# Patient Record
Sex: Female | Born: 1945
Health system: Southern US, Community
[De-identification: ages and names within clinical notes are randomized; demographics above are authoritative.]

## PROBLEM LIST (undated history)

## (undated) DIAGNOSIS — D126 Benign neoplasm of colon, unspecified: Secondary | ICD-10-CM

## (undated) DIAGNOSIS — D110 Benign neoplasm of parotid gland: Secondary | ICD-10-CM

## (undated) DIAGNOSIS — N289 Disorder of kidney and ureter, unspecified: Secondary | ICD-10-CM

## (undated) DIAGNOSIS — R1310 Dysphagia, unspecified: Secondary | ICD-10-CM

## (undated) DIAGNOSIS — F419 Anxiety disorder, unspecified: Secondary | ICD-10-CM

## (undated) DIAGNOSIS — M810 Age-related osteoporosis without current pathological fracture: Secondary | ICD-10-CM

## (undated) DIAGNOSIS — K219 Gastro-esophageal reflux disease without esophagitis: Secondary | ICD-10-CM

## (undated) DIAGNOSIS — R519 Headache, unspecified: Secondary | ICD-10-CM

## (undated) DIAGNOSIS — E039 Hypothyroidism, unspecified: Secondary | ICD-10-CM

## (undated) DIAGNOSIS — I10 Essential (primary) hypertension: Secondary | ICD-10-CM

## (undated) DIAGNOSIS — E785 Hyperlipidemia, unspecified: Secondary | ICD-10-CM

## (undated) DIAGNOSIS — N393 Stress incontinence (female) (male): Secondary | ICD-10-CM

## (undated) DIAGNOSIS — K635 Polyp of colon: Secondary | ICD-10-CM

## (undated) DIAGNOSIS — N951 Menopausal and female climacteric states: Secondary | ICD-10-CM

## (undated) DIAGNOSIS — F5104 Psychophysiologic insomnia: Secondary | ICD-10-CM

## (undated) DIAGNOSIS — E559 Vitamin D deficiency, unspecified: Secondary | ICD-10-CM

## (undated) DIAGNOSIS — M1990 Unspecified osteoarthritis, unspecified site: Secondary | ICD-10-CM

## (undated) DIAGNOSIS — T7840XA Allergy, unspecified, initial encounter: Secondary | ICD-10-CM

## (undated) DIAGNOSIS — H409 Unspecified glaucoma: Secondary | ICD-10-CM

## (undated) HISTORY — DX: Unspecified glaucoma: H40.9

## (undated) HISTORY — PX: ABDOMINAL HYSTERECTOMY: SHX81

## (undated) HISTORY — DX: Anxiety disorder, unspecified: F41.9

## (undated) HISTORY — DX: Vitamin D deficiency, unspecified: E55.9

## (undated) HISTORY — DX: Menopausal and female climacteric states: N95.1

## (undated) HISTORY — DX: Unspecified osteoarthritis, unspecified site: M19.90

## (undated) HISTORY — DX: Benign neoplasm of parotid gland: D11.0

## (undated) HISTORY — PX: OVARIAN CYST REMOVAL: SHX89

## (undated) HISTORY — PX: APPENDECTOMY: SHX54

## (undated) HISTORY — DX: Hyperlipidemia, unspecified: E78.5

## (undated) HISTORY — DX: Allergy, unspecified, initial encounter: T78.40XA

## (undated) HISTORY — DX: Stress incontinence (female) (male): N39.3

## (undated) HISTORY — PX: TONSILLECTOMY: SUR1361

## (undated) HISTORY — PX: EYE SURGERY: SHX253

## (undated) HISTORY — DX: Age-related osteoporosis without current pathological fracture: M81.0

## (undated) HISTORY — DX: Psychophysiologic insomnia: F51.04

---

## 2006-01-11 ENCOUNTER — Ambulatory Visit: Payer: Self-pay | Admitting: Endocrinology

## 2006-04-21 ENCOUNTER — Ambulatory Visit: Payer: Self-pay | Admitting: Gastroenterology

## 2006-09-29 ENCOUNTER — Ambulatory Visit: Payer: Self-pay | Admitting: Endocrinology

## 2008-12-01 ENCOUNTER — Ambulatory Visit: Payer: Self-pay | Admitting: Nephrology

## 2010-04-18 LAB — HM COLONOSCOPY

## 2010-05-18 ENCOUNTER — Ambulatory Visit: Payer: Self-pay | Admitting: Gastroenterology

## 2012-09-20 ENCOUNTER — Ambulatory Visit: Payer: Self-pay | Admitting: Family Medicine

## 2014-02-18 LAB — HM MAMMOGRAPHY: HM Mammogram: NORMAL

## 2014-02-18 LAB — HM DEXA SCAN

## 2015-02-19 LAB — LIPID PANEL
Cholesterol: 135 mg/dL (ref 0–200)
HDL: 58 mg/dL (ref 35–70)
LDL Cholesterol: 60 mg/dL
Triglycerides: 87 mg/dL (ref 40–160)

## 2015-02-19 LAB — HEMOGLOBIN A1C: Hgb A1c MFr Bld: 6 % (ref 4.0–6.0)

## 2015-03-03 ENCOUNTER — Ambulatory Visit: Payer: Self-pay

## 2015-05-06 LAB — HM MAMMOGRAPHY: HM Mammogram: NORMAL

## 2015-05-20 LAB — HM COLONOSCOPY: HM Colonoscopy: NORMAL

## 2015-05-26 ENCOUNTER — Encounter: Payer: Self-pay | Admitting: *Deleted

## 2015-05-28 ENCOUNTER — Encounter: Payer: Self-pay | Admitting: *Deleted

## 2015-05-29 ENCOUNTER — Ambulatory Visit: Payer: PPO | Admitting: Anesthesiology

## 2015-05-29 ENCOUNTER — Encounter: Payer: Self-pay | Admitting: Anesthesiology

## 2015-05-29 ENCOUNTER — Ambulatory Visit
Admission: RE | Admit: 2015-05-29 | Discharge: 2015-05-29 | Disposition: A | Discharge status: Home / Self Care | Payer: PPO | Source: Ambulatory Visit | Attending: Gastroenterology | Admitting: Gastroenterology

## 2015-05-29 ENCOUNTER — Encounter
Admission: RE | Disposition: A | Discharge status: Home / Self Care | Payer: Self-pay | Source: Ambulatory Visit | Attending: Gastroenterology

## 2015-05-29 DIAGNOSIS — K222 Esophageal obstruction: Secondary | ICD-10-CM | POA: Diagnosis not present

## 2015-05-29 DIAGNOSIS — Z7982 Long term (current) use of aspirin: Secondary | ICD-10-CM | POA: Diagnosis not present

## 2015-05-29 DIAGNOSIS — Z7951 Long term (current) use of inhaled steroids: Secondary | ICD-10-CM | POA: Insufficient documentation

## 2015-05-29 DIAGNOSIS — J449 Chronic obstructive pulmonary disease, unspecified: Secondary | ICD-10-CM | POA: Insufficient documentation

## 2015-05-29 DIAGNOSIS — F419 Anxiety disorder, unspecified: Secondary | ICD-10-CM | POA: Insufficient documentation

## 2015-05-29 DIAGNOSIS — R1310 Dysphagia, unspecified: Secondary | ICD-10-CM | POA: Insufficient documentation

## 2015-05-29 DIAGNOSIS — Z09 Encounter for follow-up examination after completed treatment for conditions other than malignant neoplasm: Secondary | ICD-10-CM | POA: Diagnosis not present

## 2015-05-29 DIAGNOSIS — E039 Hypothyroidism, unspecified: Secondary | ICD-10-CM | POA: Insufficient documentation

## 2015-05-29 DIAGNOSIS — I10 Essential (primary) hypertension: Secondary | ICD-10-CM | POA: Insufficient documentation

## 2015-05-29 DIAGNOSIS — Z79899 Other long term (current) drug therapy: Secondary | ICD-10-CM | POA: Insufficient documentation

## 2015-05-29 DIAGNOSIS — Z86010 Personal history of colonic polyps: Secondary | ICD-10-CM | POA: Diagnosis present

## 2015-05-29 HISTORY — DX: Hyperlipidemia, unspecified: E78.5

## 2015-05-29 HISTORY — DX: Polyp of colon: K63.5

## 2015-05-29 HISTORY — DX: Benign neoplasm of colon, unspecified: D12.6

## 2015-05-29 HISTORY — DX: Hypothyroidism, unspecified: E03.9

## 2015-05-29 HISTORY — DX: Dysphagia, unspecified: R13.10

## 2015-05-29 HISTORY — PX: ESOPHAGOGASTRODUODENOSCOPY: SHX5428

## 2015-05-29 HISTORY — DX: Essential (primary) hypertension: I10

## 2015-05-29 HISTORY — DX: Gastro-esophageal reflux disease without esophagitis: K21.9

## 2015-05-29 HISTORY — PX: COLONOSCOPY WITH PROPOFOL: SHX5780

## 2015-05-29 SURGERY — COLONOSCOPY WITH PROPOFOL
Anesthesia: General

## 2015-05-29 MED ORDER — PROPOFOL 10 MG/ML IV BOLUS
INTRAVENOUS | Status: DC | PRN
  Administered 2015-05-29: 50 mg via INTRAVENOUS

## 2015-05-29 MED ORDER — FENTANYL CITRATE (PF) 100 MCG/2ML IJ SOLN
INTRAMUSCULAR | Status: DC | PRN
  Administered 2015-05-29: 50 ug via INTRAVENOUS

## 2015-05-29 MED ORDER — SODIUM CHLORIDE 0.9 % IV SOLN
INTRAVENOUS | Status: DC
  Administered 2015-05-29: 1000 mL via INTRAVENOUS
  Administered 2015-05-29: 09:00:00 via INTRAVENOUS

## 2015-05-29 MED ORDER — PHENYLEPHRINE HCL 10 MG/ML IJ SOLN
INTRAMUSCULAR | Status: DC | PRN
  Administered 2015-05-29: 100 ug via INTRAVENOUS

## 2015-05-29 MED ORDER — LIDOCAINE HCL (CARDIAC) 20 MG/ML IV SOLN
INTRAVENOUS | Status: DC | PRN
  Administered 2015-05-29: 50 mg via INTRAVENOUS

## 2015-05-29 MED ORDER — PROPOFOL INFUSION 10 MG/ML OPTIME
INTRAVENOUS | Status: DC | PRN
  Administered 2015-05-29: 75 ug/kg/min via INTRAVENOUS

## 2015-05-29 MED ORDER — GLYCOPYRROLATE 0.2 MG/ML IJ SOLN
INTRAMUSCULAR | Status: DC | PRN
  Administered 2015-05-29: 0.2 mg via INTRAVENOUS

## 2015-05-29 NOTE — Anesthesia Preprocedure Evaluation (Signed)
Anesthesia Evaluation  Patient identified by MRN, date of birth, ID band Patient awake    Reviewed: Allergy & Precautions, NPO status , Patient's Chart, lab work & pertinent test results  Airway Mallampati: III  TM Distance: <3 FB Neck ROM: Full   Comment: Small mouth Dental  (+) Caps, Chipped   Pulmonary COPD COPD inhaler,  breath sounds clear to auscultation  Pulmonary exam normal       Cardiovascular hypertension, Pt. on medications Normal cardiovascular examRhythm:Regular Rate:Normal     Neuro/Psych Anxiety negative neurological ROS     GI/Hepatic Neg liver ROS, Hx of colon polyps   Endo/Other  Hypothyroidism   Renal/GU negative Renal ROS  negative genitourinary   Musculoskeletal negative musculoskeletal ROS (+)   Abdominal Normal abdominal exam  (+)   Peds negative pediatric ROS (+)  Hematology negative hematology ROS (+)   Anesthesia Other Findings   Reproductive/Obstetrics                             Anesthesia Physical Anesthesia Plan  ASA: III  Anesthesia Plan: General   Post-op Pain Management:    Induction: Intravenous  Airway Management Planned: Nasal Cannula  Additional Equipment:   Intra-op Plan:   Post-operative Plan:   Informed Consent: I have reviewed the patients History and Physical, chart, labs and discussed the procedure including the risks, benefits and alternatives for the proposed anesthesia with the patient or authorized representative who has indicated his/her understanding and acceptance.   Dental advisory given  Plan Discussed with: CRNA and Surgeon  Anesthesia Plan Comments:         Anesthesia Quick Evaluation

## 2015-05-29 NOTE — Anesthesia Postprocedure Evaluation (Signed)
  Anesthesia Post-op Note  Patient: Dawn Fuentes  Procedure(s) Performed: Procedure(s): COLONOSCOPY WITH PROPOFOL (N/A) ESOPHAGOGASTRODUODENOSCOPY (EGD) (N/A)  Anesthesia type:General  Patient location: PACU  Post pain: Pain level controlled  Post assessment: Post-op Vital signs reviewed, Patient's Cardiovascular Status Stable, Respiratory Function Stable, Patent Airway and No signs of Nausea or vomiting  Post vital signs: Reviewed and stable  Last Vitals:  Filed Vitals:   05/29/15 0950  BP: 120/57  Pulse: 79  Temp:   Resp: 23    Level of consciousness: awake, alert  and patient cooperative  Complications: No apparent anesthesia complications

## 2015-05-29 NOTE — H&P (Signed)
Primary Care Physician:  Loistine Chance, MD Primary Gastroenterologist:  Dr. Candace Cruise  Pre-Procedure History & Physical: HPI:  Dawn Fuentes is a 69 y.o. female is here for an EGD with dilation and colonoscopy.   Past Medical History  Diagnosis Date  . Colon adenoma   . Polyp of colon   . Dysphagia     No past surgical history on file.  Prior to Admission medications   Medication Sig Start Date End Date Taking? Authorizing Provider  albuterol (PROVENTIL HFA;VENTOLIN HFA) 108 (90 BASE) MCG/ACT inhaler Inhale 2 puffs into the lungs every 4 (four) hours as needed for wheezing or shortness of breath.   Yes Historical Provider, MD  ALPRAZolam Duanne Moron) 0.5 MG tablet Take 0.5-1 tablets by mouth 4 (four) times daily as needed.  04/03/15  Yes Historical Provider, MD  aspirin 81 MG tablet Take 81 mg by mouth daily.   Yes Historical Provider, MD  bacitracin ophthalmic ointment Place 1 application into both eyes 4 (four) times daily as needed for wound care.  03/03/15  Yes Historical Provider, MD  cloNIDine (CATAPRES) 0.1 MG tablet Take 0.1 mg by mouth every evening.   Yes Historical Provider, MD  desonide (DESOWEN) 0.05 % cream Apply 1 application topically 3 (three) times daily as needed (healing).  03/19/15  Yes Historical Provider, MD  fluticasone (FLONASE) 50 MCG/ACT nasal spray Place 2 sprays into both nostrils daily as needed for allergies.  04/03/15  Yes Historical Provider, MD  levothyroxine (SYNTHROID, LEVOTHROID) 88 MCG tablet Take 44-88 mcg by mouth daily before breakfast. Monday and Wednesday 1 tablet. Tues, thurs, fri, sat, and sun take 1/2 tablet   Yes Historical Provider, MD  loratadine (CLARITIN) 10 MG tablet Take 10 mg by mouth daily as needed for allergies.   Yes Historical Provider, MD  losartan (COZAAR) 100 MG tablet Take 100 mg by mouth daily.   Yes Historical Provider, MD  montelukast (SINGULAIR) 10 MG tablet Take 10 mg by mouth daily as needed (allergies).   Yes Historical  Provider, MD  pantoprazole (PROTONIX) 40 MG tablet Take 40 mg by mouth daily.   Yes Historical Provider, MD  simvastatin (ZOCOR) 40 MG tablet Take 40 mg by mouth daily.   Yes Historical Provider, MD  zolpidem (AMBIEN) 10 MG tablet Take 5-10 mg by mouth at bedtime as needed for sleep.   Yes Historical Provider, MD    Allergies as of 05/01/2015  . (Not on File)    No family history on file.  History   Social History  . Marital Status: Married    Spouse Name: N/A  . Number of Children: N/A  . Years of Education: N/A   Occupational History  . Not on file.   Social History Main Topics  . Smoking status: Not on file  . Smokeless tobacco: Not on file  . Alcohol Use: Not on file  . Drug Use: Not on file  . Sexual Activity: Not on file   Other Topics Concern  . Not on file   Social History Narrative    Review of Systems: See HPI, otherwise negative ROS  Physical Exam: There were no vitals taken for this visit. General:   Alert,  pleasant and cooperative in NAD Head:  Normocephalic and atraumatic. Neck:  Supple; no masses or thyromegaly. Lungs:  Clear throughout to auscultation.    Heart:  Regular rate and rhythm. Abdomen:  Soft, nontender and nondistended. Normal bowel sounds, without guarding, and without rebound.  Neurologic:  Alert and  oriented x4;  grossly normal neurologically.  Impression/Plan: [name] is here for an EGD and colonoscopy to be performed for personal hx of colon polyp and hx of dysphagia.  Risks, benefits, limitations, and alternatives regarding EGD and colonoscopy have been reviewed with the patient.  Questions have been answered.  All parties agreeable.   [name], [name], MD  05/29/2015, 8:03 AM

## 2015-05-29 NOTE — Transfer of Care (Signed)
Immediate Anesthesia Transfer of Care Note  Patient: Dawn Fuentes  Procedure(s) Performed: Procedure(s): COLONOSCOPY WITH PROPOFOL (N/A) ESOPHAGOGASTRODUODENOSCOPY (EGD) (N/A)  Patient Location: PACU and Endoscopy Unit  Anesthesia Type:General  Level of Consciousness: awake, alert  and oriented  Airway & Oxygen Therapy: Patient Spontanous Breathing and Patient connected to nasal cannula oxygen  Post-op Assessment: Report given to RN and Post -op Vital signs reviewed and stable  Post vital signs: Reviewed and stable  Last Vitals:  Filed Vitals:   05/29/15 0917  BP:   Pulse: 73  Temp: 36.2 C  Resp: 14    Complications: No apparent anesthesia complications

## 2015-05-29 NOTE — Op Note (Signed)
[facility] Gastroenterology Patient Name: Dawn Fuentes Procedure Date: 05/29/2015 8:57 AM MRN: [id number] Account #: [id number] Date of Birth: December 24, 1945 Admit Type: Outpatient Age: 69 Room: [facility] ENDO ROOM 4 Gender: Female Note Status: Finalized Procedure:         Upper GI endoscopy Indications:       Dysphagia Providers:         [name]. Candace Cruise, MD Referring MD:      Bethena Roys. Sowles, MD (Referring MD) Medicines:         Monitored Anesthesia Care Complications:     No immediate complications. Procedure:         Pre-Anesthesia Assessment:                    - Prior to the procedure, a History and Physical was                     performed, and patient medications, allergies and                     sensitivities were reviewed. The patient's tolerance of                     previous anesthesia was reviewed.                    - The risks and benefits of the procedure and the sedation                     options and risks were discussed with the patient. All                     questions were answered and informed consent was obtained.                    - After reviewing the risks and benefits, the patient was                     deemed in satisfactory condition to undergo the procedure.                    After obtaining informed consent, the endoscope was passed                     under direct vision. Throughout the procedure, the                     patient's blood pressure, pulse, and oxygen saturations                     were monitored continuously. The was introduced through                     the mouth, and advanced to the second part of duodenum.                     The upper GI endoscopy was accomplished without                     difficulty. The patient tolerated the procedure well. Findings:      A benign-appearing, intrinsic mild stenosis was found at the       gastroesophageal junction. The scope was withdrawn. Dilation was       performed with a  Maloney dilator with mild  resistance at 54 Fr.      The exam was otherwise without abnormality.      The entire examined stomach was normal.      The examined duodenum was normal. Impression:        - Benign-appearing esophageal stricture. Dilated.                    - The examination was otherwise normal.                    - Normal stomach.                    - Normal examined duodenum.                    - No specimens collected. Recommendation:    - Discharge patient to home.                    - Observe patient's clinical course.                    - The findings and recommendations were discussed with the                     patient. Procedure Code(s): --- Professional ---                    [id number], Esophagogastroduodenoscopy, flexible, transoral;                     diagnostic, including collection of specimen(s) by                     brushing or washing, when performed (separate procedure)                    43450, Dilation of esophagus, by unguided sound or bougie,                     single or multiple passes Diagnosis Code(s): --- Professional ---                    K22.2, Esophageal obstruction                    R13.10, Dysphagia, unspecified CPT copyright 2014 American Medical Association. All rights reserved. The codes documented in this report are preliminary and upon coder review may  be revised to meet current compliance requirements. Hulen Luster, MD 05/29/2015 9:03:36 AM This report has been signed electronically. Number of Addenda: 0 Note Initiated On: 05/29/2015 8:57 AM      [facility]

## 2015-05-29 NOTE — Op Note (Signed)
[facility] Gastroenterology Patient Name: Dawn Fuentes Procedure Date: 05/29/2015 8:56 AM MRN: [id number] Account #: [id number] Date of Birth: 1946/06/02 Admit Type: Outpatient Age: 69 Room: [facility] ENDO ROOM 4 Gender: Female Note Status: Finalized Procedure:         Colonoscopy Indications:       Personal history of colonic polyps Providers:         [name]. Candace Cruise, MD Referring MD:      Bethena Roys. Sowles, MD (Referring MD) Medicines:         Monitored Anesthesia Care Complications:     No immediate complications. Procedure:         Pre-Anesthesia Assessment:                    - Prior to the procedure, a History and Physical was                     performed, and patient medications, allergies and                     sensitivities were reviewed. The patient's tolerance of                     previous anesthesia was reviewed.                    - The risks and benefits of the procedure and the sedation                     options and risks were discussed with the patient. All                     questions were answered and informed consent was obtained.                    - After reviewing the risks and benefits, the patient was                     deemed in satisfactory condition to undergo the procedure.                    After obtaining informed consent, the colonoscope was                     passed under direct vision. Throughout the procedure, the                     patient's blood pressure, pulse, and oxygen saturations                     were monitored continuously. The Olympus PCF-160AL                     Colonoscope (S# [id number]) was introduced through the anus                     and advanced to the the cecum, identified by appendiceal                     orifice and ileocecal valve. The colonoscopy was performed                     without difficulty. The patient tolerated the procedure  well. The quality of the bowel preparation was  good. Findings:      The colon (entire examined portion) appeared normal. Impression:        - The entire examined colon is normal.                    - No specimens collected. Recommendation:    - Discharge patient to home.                    - Repeat colonoscopy in 5 years for surveillance.                    - The findings and recommendations were discussed with the                     patient. Procedure Code(s): --- Professional ---                    [phone], Colonoscopy, flexible; diagnostic, including                     collection of specimen(s) by brushing or washing, when                     performed (separate procedure) Diagnosis Code(s): --- Professional ---                    Z86.010, Personal history of colonic polyps CPT copyright 2014 American Medical Association. All rights reserved. The codes documented in this report are preliminary and upon coder review may  be revised to meet current compliance requirements. Hulen Luster, MD 05/29/2015 9:16:29 AM This report has been signed electronically. Number of Addenda: 0 Note Initiated On: 05/29/2015 8:56 AM Scope Withdrawal Time: 0 hours 6 minutes 1 second  Total Procedure Duration: 0 hours 9 minutes 47 seconds       [facility]

## 2015-06-03 ENCOUNTER — Encounter: Payer: Self-pay | Admitting: Gastroenterology

## 2015-07-08 ENCOUNTER — Other Ambulatory Visit: Payer: Self-pay | Admitting: Family Medicine

## 2015-07-30 ENCOUNTER — Telehealth: Payer: Self-pay | Admitting: Family Medicine

## 2015-07-30 NOTE — Telephone Encounter (Signed)
She can try topical lotion, take anti -itchy medication like benadryl, if there is oozing or increase in redness she needs to be seen because it could be a sign of secondary infection

## 2015-07-30 NOTE — Telephone Encounter (Signed)
Please advise 

## 2015-07-30 NOTE — Telephone Encounter (Signed)
Pt has 60 chigger bites behind her knees, up the legs, and in near the groin area.  She got bit about a week ago but the break out happen on Sat. (07/25/15).  She stated that some of the bumps are small and the others are about the size of a 50 cent piece.  Patient wants to know should she just let the rash take it's course or does she need to be treated?

## 2015-07-31 NOTE — Telephone Encounter (Signed)
Pt notified and is already using some stuff.  She was told if ozzing or not better by Monday call for an apptoiment

## 2015-07-31 NOTE — Telephone Encounter (Signed)
Called patient and left her a message to return my call.

## 2015-08-21 ENCOUNTER — Encounter: Payer: Self-pay | Admitting: Family Medicine

## 2015-08-21 DIAGNOSIS — R739 Hyperglycemia, unspecified: Secondary | ICD-10-CM | POA: Insufficient documentation

## 2015-08-21 DIAGNOSIS — N393 Stress incontinence (female) (male): Secondary | ICD-10-CM | POA: Insufficient documentation

## 2015-08-21 DIAGNOSIS — E559 Vitamin D deficiency, unspecified: Secondary | ICD-10-CM | POA: Insufficient documentation

## 2015-08-21 DIAGNOSIS — Z1389 Encounter for screening for other disorder: Secondary | ICD-10-CM | POA: Insufficient documentation

## 2015-08-21 DIAGNOSIS — E785 Hyperlipidemia, unspecified: Secondary | ICD-10-CM | POA: Insufficient documentation

## 2015-08-21 DIAGNOSIS — E039 Hypothyroidism, unspecified: Secondary | ICD-10-CM | POA: Insufficient documentation

## 2015-08-21 DIAGNOSIS — J302 Other seasonal allergic rhinitis: Secondary | ICD-10-CM | POA: Insufficient documentation

## 2015-08-21 DIAGNOSIS — M810 Age-related osteoporosis without current pathological fracture: Secondary | ICD-10-CM | POA: Insufficient documentation

## 2015-08-21 DIAGNOSIS — G4700 Insomnia, unspecified: Secondary | ICD-10-CM | POA: Insufficient documentation

## 2015-08-21 DIAGNOSIS — F411 Generalized anxiety disorder: Secondary | ICD-10-CM | POA: Insufficient documentation

## 2015-08-21 DIAGNOSIS — N951 Menopausal and female climacteric states: Secondary | ICD-10-CM | POA: Insufficient documentation

## 2015-08-21 DIAGNOSIS — H409 Unspecified glaucoma: Secondary | ICD-10-CM | POA: Insufficient documentation

## 2015-08-21 DIAGNOSIS — I10 Essential (primary) hypertension: Secondary | ICD-10-CM | POA: Insufficient documentation

## 2015-08-21 DIAGNOSIS — K219 Gastro-esophageal reflux disease without esophagitis: Secondary | ICD-10-CM | POA: Insufficient documentation

## 2015-08-25 ENCOUNTER — Encounter: Payer: Self-pay | Admitting: Family Medicine

## 2015-08-25 ENCOUNTER — Ambulatory Visit (INDEPENDENT_AMBULATORY_CARE_PROVIDER_SITE_OTHER): Payer: PPO | Admitting: Family Medicine

## 2015-08-25 VITALS — BP 116/70 | HR 76 | Temp 97.4°F | Resp 16 | Ht 66.0 in | Wt 157.7 lb

## 2015-08-25 DIAGNOSIS — I10 Essential (primary) hypertension: Secondary | ICD-10-CM | POA: Diagnosis not present

## 2015-08-25 DIAGNOSIS — R739 Hyperglycemia, unspecified: Secondary | ICD-10-CM

## 2015-08-25 DIAGNOSIS — Z23 Encounter for immunization: Secondary | ICD-10-CM | POA: Diagnosis not present

## 2015-08-25 DIAGNOSIS — E785 Hyperlipidemia, unspecified: Secondary | ICD-10-CM

## 2015-08-25 DIAGNOSIS — E559 Vitamin D deficiency, unspecified: Secondary | ICD-10-CM

## 2015-08-25 DIAGNOSIS — K219 Gastro-esophageal reflux disease without esophagitis: Secondary | ICD-10-CM | POA: Diagnosis not present

## 2015-08-25 DIAGNOSIS — N951 Menopausal and female climacteric states: Secondary | ICD-10-CM

## 2015-08-25 DIAGNOSIS — E038 Other specified hypothyroidism: Secondary | ICD-10-CM | POA: Diagnosis not present

## 2015-08-25 DIAGNOSIS — J302 Other seasonal allergic rhinitis: Secondary | ICD-10-CM

## 2015-08-25 DIAGNOSIS — G4700 Insomnia, unspecified: Secondary | ICD-10-CM

## 2015-08-25 MED ORDER — PANTOPRAZOLE SODIUM 40 MG PO TBEC: 40.0000 mg | DELAYED_RELEASE_TABLET | Freq: Every day | ORAL | Status: DC

## 2015-08-25 MED ORDER — CLONIDINE HCL 0.1 MG PO TABS: 0.1000 mg | ORAL_TABLET | Freq: Every day | ORAL | Status: DC

## 2015-08-25 MED ORDER — LEVOTHYROXINE SODIUM 88 MCG PO TABS: 44.0000 ug | ORAL_TABLET | Freq: Every day | ORAL | Status: DC

## 2015-08-25 MED ORDER — SIMVASTATIN 40 MG PO TABS: 40.0000 mg | ORAL_TABLET | Freq: Every day | ORAL | Status: DC

## 2015-08-25 MED ORDER — MONTELUKAST SODIUM 10 MG PO TABS: 10.0000 mg | ORAL_TABLET | Freq: Every day | ORAL | Status: DC | PRN

## 2015-08-25 MED ORDER — LOSARTAN POTASSIUM 100 MG PO TABS: 100.0000 mg | ORAL_TABLET | Freq: Every day | ORAL | Status: DC

## 2015-08-25 NOTE — Progress Notes (Signed)
Name: Dawn Fuentes   MRN: [id number]    DOB: June 29, 1946   Date:08/25/2015       Progress Note  Subjective  Chief Complaint  Chief Complaint  Patient presents with  . Medication Refill    6 month f/u  . Hypertension  . Hypothyroidism  . Gastrophageal Reflux  . Insomnia  . Anxiety    HPI  HTN: her bp is at goal, she denies dizziness, chest pain or palpitation.  Hypothyroidism: she is compliant with medication, denies constipation, no dry skin, no fatigue, mild weight gain  GERD: she takes medication daily, she states occasionally has heartburn - at most once a week, when she does not follow a GERD appropriate diet  Insomnia: she is taking Ambien half to one pill daily , when she skips a dose she does not sleep well and dreams a lot and is not rested in am.   Anxiety: is mild now, takes Alprazolam prn only, last bottle of 60 pills was filled in April She states she is not worrying as much now  Hot Flashes: doing better on Clonidine qpm    Patient Active Problem List   Diagnosis Date Noted  . Anxiety state 08/21/2015  . Benign hypertension 08/21/2015  . Insomnia, persistent 08/21/2015  . Dyslipidemia 08/21/2015  . Gastro-esophageal reflux disease without esophagitis 08/21/2015  . Glaucoma 08/21/2015  . Blood glucose elevated 08/21/2015  . Adult hypothyroidism 08/21/2015  . Climacteric 08/21/2015  . Senile osteoporosis 08/21/2015  . Seasonal allergies 08/21/2015  . Female genuine stress incontinence 08/21/2015  . Vitamin D deficiency 08/21/2015    Past Surgical History  Procedure Laterality Date  . Abdominal hysterectomy    . Eye surgery    . Ovarian cyst removal    . Colonoscopy with propofol N/A 05/29/2015    Procedure: COLONOSCOPY WITH PROPOFOL;  Surgeon: Hulen Luster, MD;  Location: [facility] ENDOSCOPY;  Service: Gastroenterology;  Laterality: N/A;  . Esophagogastroduodenoscopy N/A 05/29/2015    Procedure: ESOPHAGOGASTRODUODENOSCOPY (EGD);  Surgeon: Hulen Luster, MD;   Location: [facility] ENDOSCOPY;  Service: Gastroenterology;  Laterality: N/A;  . Appendectomy      Family History  Problem Relation Age of Onset  . Hypertension Mother   . Osteoporosis Mother   . COPD Mother   . Dementia Mother   . Cancer Father     Colon    Social History   Social History  . Marital Status: Married    Spouse Name: N/A  . Number of Children: N/A  . Years of Education: N/A   Occupational History  . Not on file.   Social History Main Topics  . Smoking status: Never Smoker   . Smokeless tobacco: Never Used  . Alcohol Use: No  . Drug Use: No  . Sexual Activity: Not Currently   Other Topics Concern  . Not on file   Social History Narrative     Current outpatient prescriptions:  .  ALPRAZolam (XANAX) 0.5 MG tablet, Take 0.5-1 tablets by mouth 4 (four) times daily as needed. , Disp: , Rfl: 1 .  aspirin 81 MG tablet, Take 81 mg by mouth daily., Disp: , Rfl:  .  cloNIDine (CATAPRES) 0.1 MG tablet, Take 1 tablet (0.1 mg total) by mouth daily., Disp: 90 tablet, Rfl: 3 .  levothyroxine (SYNTHROID, LEVOTHROID) 88 MCG tablet, Take 0.5-1 tablets (44-88 mcg total) by mouth daily before breakfast. Monday and Wednesday 1 tablet. Tues, thurs, fri, sat, and sun take 1/2 tablet, Disp: 90 tablet, Rfl:  1 .  loratadine (CLARITIN) 10 MG tablet, Take 10 mg by mouth daily as needed for allergies., Disp: , Rfl:  .  losartan (COZAAR) 100 MG tablet, Take 1 tablet (100 mg total) by mouth daily., Disp: 90 tablet, Rfl: 3 .  montelukast (SINGULAIR) 10 MG tablet, Take 1 tablet (10 mg total) by mouth daily as needed (allergies)., Disp: 90 tablet, Rfl: 3 .  pantoprazole (PROTONIX) 40 MG tablet, Take 1 tablet (40 mg total) by mouth daily., Disp: 90 tablet, Rfl: 3 .  simvastatin (ZOCOR) 40 MG tablet, Take 1 tablet (40 mg total) by mouth daily., Disp: 90 tablet, Rfl: 1 .  zolpidem (AMBIEN) 10 MG tablet, Take 5-10 mg by mouth at bedtime as needed for sleep., Disp: , Rfl:   Allergies  Allergen  Reactions  . Raloxifene     bone pain  . Sulfa Antibiotics Nausea Only    Intolerance rather than allergy.     ROS  Constitutional: Negative for fever or weight change.  Respiratory: Negative for cough and shortness of breath.   Cardiovascular: Negative for chest pain or palpitations.  Gastrointestinal: Negative for abdominal pain, no bowel changes.  Musculoskeletal: Negative for gait problem or joint swelling.  Skin: Negative for rash.  Neurological: Negative for dizziness or headache.  No other specific complaints in a complete review of systems (except as listed in HPI above).  Objective  Filed Vitals:   08/25/15 1132  BP: 116/70  Pulse: 76  Temp: 97.4 F (36.3 C)  TempSrc: Oral  Resp: 16  Height: 5\' 6"  (1.676 m)  Weight: 157 lb 11.2 oz (71.532 kg)  SpO2: 96%    Body mass index is 25.47 kg/(m^2).  Physical Exam  Constitutional: Patient appears well-developed and well-nourished. No distress.  HEENT: head atraumatic, normocephalic, pupils equal and reactive to light,  neck supple, throat within normal limits Cardiovascular: Normal rate, regular rhythm and normal heart sounds.  No murmur heard. No BLE edema. Pulmonary/Chest: Effort normal and breath sounds normal. No respiratory distress. Abdominal: Soft.  There is no tenderness. Psychiatric: Patient has a normal mood and affect. behavior is normal. Judgment and thought content normal.   PHQ2/9: Depression screen PHQ 2/9 08/25/2015  Decreased Interest 0  Down, Depressed, Hopeless 0  PHQ - 2 Score 0     Fall Risk: Fall Risk  08/25/2015  Falls in the past year? No      Assessment & Plan  1. Benign hypertension controlled - Comprehensive metabolic panel - cloNIDine (CATAPRES) 0.1 MG tablet; Take 1 tablet (0.1 mg total) by mouth daily.  Dispense: 90 tablet; Refill: 3  2. Needs flu shot  - Flu vaccine HIGH DOSE PF (Fluzone High dose)  3. Gastro-esophageal reflux disease without esophagitis  - losartan  (COZAAR) 100 MG tablet; Take 1 tablet (100 mg total) by mouth daily.  Dispense: 90 tablet; Refill: 3 - pantoprazole (PROTONIX) 40 MG tablet; Take 1 tablet (40 mg total) by mouth daily.  Dispense: 90 tablet; Refill: 3  4. Other specified hypothyroidism  - TSH - levothyroxine (SYNTHROID, LEVOTHROID) 88 MCG tablet; Take 0.5-1 tablets (44-88 mcg total) by mouth daily before breakfast. Monday and Wednesday 1 tablet. Tues, thurs, fri, sat, and sun take 1/2 tablet  Dispense: 90 tablet; Refill: 1  5. Dyslipidemia  - simvastatin (ZOCOR) 40 MG tablet; Take 1 tablet (40 mg total) by mouth daily.  Dispense: 90 tablet; Refill: 1  6. Vitamin D deficiency  - Vit D  25 hydroxy (rtn osteoporosis monitoring)  7. Insomnia, persistent  Still taking medication and is doing well   8. Climacteric Doing well on medication   9. Blood glucose elevated  - Hemoglobin A1c  10. Seasonal allergic rhinitis  - montelukast (SINGULAIR) 10 MG tablet; Take 1 tablet (10 mg total) by mouth daily as needed (allergies).  Dispense: 90 tablet; Refill: 3

## 2015-08-26 ENCOUNTER — Other Ambulatory Visit: Payer: Self-pay | Admitting: Family Medicine

## 2015-08-26 LAB — COMPREHENSIVE METABOLIC PANEL WITH GFR
ALT: 25 [IU]/L (ref 0–32)
AST: 24 [IU]/L (ref 0–40)
Albumin/Globulin Ratio: 1.9 (ref 1.1–2.5)
Albumin: 4.8 g/dL (ref 3.6–4.8)
Alkaline Phosphatase: 70 [IU]/L (ref 39–117)
BUN/Creatinine Ratio: 12 (ref 11–26)
BUN: 14 mg/dL (ref 8–27)
Bilirubin Total: 0.5 mg/dL (ref 0.0–1.2)
CO2: 27 mmol/L (ref 18–29)
Calcium: 9.8 mg/dL (ref 8.7–10.3)
Chloride: 102 mmol/L (ref 97–108)
Creatinine, Ser: 1.15 mg/dL — ABNORMAL HIGH (ref 0.57–1.00)
GFR calc Af Amer: 57 mL/min/{1.73_m2} — ABNORMAL LOW
GFR calc non Af Amer: 50 mL/min/{1.73_m2} — ABNORMAL LOW
Globulin, Total: 2.5 g/dL (ref 1.5–4.5)
Glucose: 97 mg/dL (ref 65–99)
Potassium: 4.7 mmol/L (ref 3.5–5.2)
Sodium: 143 mmol/L (ref 134–144)
Total Protein: 7.3 g/dL (ref 6.0–8.5)

## 2015-08-26 LAB — HEMOGLOBIN A1C
Est. average glucose Bld gHb Est-mCnc: 128 mg/dL
Hgb A1c MFr Bld: 6.1 % — ABNORMAL HIGH (ref 4.8–5.6)

## 2015-08-26 LAB — TSH: TSH: 3.02 u[IU]/mL (ref 0.450–4.500)

## 2015-08-26 LAB — VITAMIN D 25 HYDROXY (VIT D DEFICIENCY, FRACTURES): Vit D, 25-Hydroxy: 19.4 ng/mL — ABNORMAL LOW (ref 30.0–100.0)

## 2015-08-26 MED ORDER — VITAMIN D (ERGOCALCIFEROL) 1.25 MG (50000 UNIT) PO CAPS: 50000.0000 [IU] | ORAL_CAPSULE | ORAL | Status: DC

## 2015-08-26 NOTE — Progress Notes (Signed)
Patient notified

## 2015-08-26 NOTE — Telephone Encounter (Signed)
Patient requesting refill. 

## 2015-09-23 ENCOUNTER — Telehealth: Payer: Self-pay

## 2015-09-23 MED ORDER — CIPROFLOXACIN HCL 250 MG PO TABS: 250.0000 mg | ORAL_TABLET | Freq: Two times a day (BID) | ORAL | Status: DC

## 2015-09-23 NOTE — Telephone Encounter (Signed)
Pt states she has a UTI! She tested at home. You are totally booked today and tomorrow and you are off on Friday and every one else is booked!  She wants to know if she can get an antibiotic?  She states was just seen and gets these pretty regularly.

## 2015-09-23 NOTE — Telephone Encounter (Signed)
Sent cipro for 3 days.

## 2015-09-25 NOTE — Telephone Encounter (Signed)
Error

## 2015-10-12 ENCOUNTER — Ambulatory Visit (INDEPENDENT_AMBULATORY_CARE_PROVIDER_SITE_OTHER): Payer: PPO | Admitting: Family Medicine

## 2015-10-12 ENCOUNTER — Telehealth: Payer: Self-pay | Admitting: Family Medicine

## 2015-10-12 ENCOUNTER — Encounter: Payer: Self-pay | Admitting: Family Medicine

## 2015-10-12 VITALS — BP 116/72 | HR 105 | Temp 98.3°F | Resp 18 | Wt 155.6 lb

## 2015-10-12 DIAGNOSIS — J302 Other seasonal allergic rhinitis: Secondary | ICD-10-CM | POA: Diagnosis not present

## 2015-10-12 DIAGNOSIS — N3090 Cystitis, unspecified without hematuria: Secondary | ICD-10-CM | POA: Diagnosis not present

## 2015-10-12 LAB — POCT URINALYSIS DIPSTICK
Bilirubin, UA: NEGATIVE
Glucose, UA: NEGATIVE
Ketones, UA: NEGATIVE
Nitrite, UA: NEGATIVE
Spec Grav, UA: 1.015
Urobilinogen, UA: 0.2
pH, UA: 6

## 2015-10-12 MED ORDER — CIPROFLOXACIN HCL 500 MG PO TABS: 500.0000 mg | ORAL_TABLET | Freq: Two times a day (BID) | ORAL | Status: DC

## 2015-10-12 MED ORDER — FLUTICASONE PROPIONATE 50 MCG/ACT NA SUSP: 2.0000 | Freq: Every day | NASAL | Status: DC

## 2015-10-12 NOTE — Telephone Encounter (Signed)
Patient notidfied will be seeing Dr Nadine Counts today

## 2015-10-12 NOTE — Patient Instructions (Signed)

## 2015-10-12 NOTE — Telephone Encounter (Signed)
Another UTI (last one was 17 days ago per patient).  Patient took home test and came back positive.  Patient wanted to come in to be seen but there are no availability.  Please advise

## 2015-10-12 NOTE — Progress Notes (Signed)
Name: Dawn Fuentes   MRN: [id number]    DOB: 1946-02-28   Date:10/12/2015       Progress Note  Subjective  Chief Complaint  Chief Complaint  Patient presents with  . Urinary Tract Infection    patient presents with pain upon urination since 4am this morning. patient did a home uti test and it was positive. patient has had some lower abdominal presure and urine has been cloudy.   . Ear Pain    patient has had some intermittent pain and pressure in her right ear.  . Medication Refill    flonase nasal spray    HPI  Patient is here today with concerns regarding the following symptoms abnormal smelling urine, dysuria, frequency and suprapubic pressure that started hours ago.  Associated with lower back pain. Not associated with fevers, chills, blood in urine, nausea, vomiting, vaginal discharge. About 2 weeks ago she had similar symptoms and was prescribed Cipro 250 mg po bid for 3 days and she noted that the symptoms did subside but then re occurred this morning. She gets about 2-3 UTIs a year. She also complains of isolated ear pressure in the right ear. Needs flonase refill.     Past Medical History  Diagnosis Date  . Colon adenoma   . Polyp of colon   . Dysphagia   . Hypertension   . Hypothyroidism   . GERD (gastroesophageal reflux disease)   . Hyperlipidemia   . Chronic insomnia   . Menopause syndrome   . Allergy   . Anxiety   . Dyslipidemia   . Vitamin D deficiency   . Osteoporosis   . Female stress incontinence   . Glaucoma, both eyes     Dr. Wallace Going- [facility]    Social History  Substance Use Topics  . Smoking status: Never Smoker   . Smokeless tobacco: Never Used  . Alcohol Use: No     Current outpatient prescriptions:  .  ALPRAZolam (XANAX) 0.5 MG tablet, TAKE 1 TABLET BY MOUTH UP TO FOUR TIMES DAILY AS NEEDED FOR ANXIETY, Disp: 60 tablet, Rfl: 0 .  aspirin 81 MG tablet, Take 81 mg by mouth daily., Disp: , Rfl:  .  cloNIDine (CATAPRES) 0.1 MG  tablet, Take 1 tablet (0.1 mg total) by mouth daily., Disp: 90 tablet, Rfl: 3 .  levothyroxine (SYNTHROID, LEVOTHROID) 88 MCG tablet, Take 0.5-1 tablets (44-88 mcg total) by mouth daily before breakfast. Monday and Wednesday 1 tablet. Tues, thurs, fri, sat, and sun take 1/2 tablet, Disp: 90 tablet, Rfl: 1 .  loratadine (CLARITIN) 10 MG tablet, Take 10 mg by mouth daily as needed for allergies., Disp: , Rfl:  .  losartan (COZAAR) 100 MG tablet, Take 1 tablet (100 mg total) by mouth daily., Disp: 90 tablet, Rfl: 3 .  montelukast (SINGULAIR) 10 MG tablet, Take 1 tablet (10 mg total) by mouth daily as needed (allergies)., Disp: 90 tablet, Rfl: 3 .  pantoprazole (PROTONIX) 40 MG tablet, Take 1 tablet (40 mg total) by mouth daily., Disp: 90 tablet, Rfl: 3 .  PAZEO 0.7 % SOLN, INSTILL 1 GTT IN OU QD, Disp: , Rfl: 6 .  simvastatin (ZOCOR) 40 MG tablet, Take 1 tablet (40 mg total) by mouth daily., Disp: 90 tablet, Rfl: 1 .  timolol (BETIMOL) 0.25 % ophthalmic solution, 1-2 drops 2 (two) times daily., Disp: , Rfl:  .  Vitamin D, Ergocalciferol, (DRISDOL) 50000 UNITS CAPS capsule, Take 1 capsule (50,000 Units total) by mouth every 7 (  seven) days., Disp: 12 capsule, Rfl: 0 .  zolpidem (AMBIEN) 10 MG tablet, Take 5-10 mg by mouth at bedtime as needed for sleep., Disp: , Rfl:   Allergies  Allergen Reactions  . Raloxifene     bone pain  . Sulfa Antibiotics Nausea Only    Intolerance rather than allergy.    ROS  Positive for dysuria, ear pain as mentioned in HPI, otherwise all systems reviewed and are negative.  Objective  Filed Vitals:   10/12/15 1604  BP: 116/72  Pulse: 105  Temp: 98.3 F (36.8 C)  TempSrc: Oral  Resp: 18  Weight: 155 lb 9.6 oz (70.58 kg)  SpO2: 96%   Body mass index is 25.13 kg/(m^2).  Results for orders placed or performed in visit on 10/12/15 (from the past 24 hour(s))  POCT Urinalysis Dipstick     Status: Abnormal   Collection Time: 10/12/15  4:34 PM  Result Value Ref  Range   Color, UA YELLOW    Clarity, UA CLOUDY    Glucose, UA NEGATIVE    Bilirubin, UA NEGATIVE    Ketones, UA NEGATIVE    Spec Grav, UA 1.015    Blood, UA LARGE    pH, UA 6.0    Protein, UA TRACE    Urobilinogen, UA 0.2    Nitrite, UA NEGATIVE    Leukocytes, UA moderate (2+) (A) Negative   Physical Exam  Constitutional: Patient appears well-developed and well-nourished. In no acute distress. HEENT:  - Head: Normocephalic and atraumatic.  - Ears: Bilateral TMs gray, no erythema or effusion - Nose: Nasal mucosa moist, boggy. - Mouth/Throat: Oropharynx is clear and moist. No tonsillar hypertrophy or erythema. No post nasal drainage.  - Eyes: Conjunctivae clear, EOM movements normal. PERRLA. No scleral icterus.  Neck: Normal range of motion. Neck supple. No JVD present. No thyromegaly present.  Cardiovascular: Normal rate, regular rhythm and normal heart sounds.  No murmur heard.  Pulmonary/Chest: Effort normal and breath sounds normal. No respiratory distress. Abdomen: Soft with normal bowel sounds, mild tenderness on deep palpation over suprapubic area, no reproducible flank tenderness bilaterally.  Genitourinary: Exam deferred. Skin: Skin is warm and dry. No rash noted. No erythema.  Psychiatric: Patient has a normal mood and affect. Behavior is normal in office today. Judgment and thought content normal in office today.   Assessment & Plan  1. Cystitis Symptoms suggestive of uncomplicated urinary tract infection. Instructed patient on increasing hydration with water and ways to prevent future UTIs. May use Azo for symptomatic relief if not already doing so but not recommended to be used beyond 2-3 days. May start antibiotic therapy. I will culture urine as she has been on Cipro about 2 weeks ago.  The patient has been counseled on the proper use, side effects and potential interactions of the new medication. Patient encouraged to review the side effects and safety profile  pamphlet provided with the prescription from the pharmacy as well as request counseling from the pharmacy team as needed.   - Urine Culture - POCT Urinalysis Dipstick - ciprofloxacin (CIPRO) 500 MG tablet; Take 1 tablet (500 mg total) by mouth 2 (two) times daily.  Dispense: 14 tablet; Refill: 0  2. Seasonal allergies Ear pain likely from sinus congestion from allergies as she has ran out of flonase.  - fluticasone (FLONASE) 50 MCG/ACT nasal spray; Place 2 sprays into both nostrils daily.  Dispense: 16 g; Refill: 6

## 2015-10-15 LAB — URINE CULTURE

## 2015-10-22 ENCOUNTER — Other Ambulatory Visit: Payer: Self-pay | Admitting: Family Medicine

## 2015-11-29 ENCOUNTER — Other Ambulatory Visit: Payer: Self-pay | Admitting: Family Medicine

## 2015-12-29 DIAGNOSIS — H401132 Primary open-angle glaucoma, bilateral, moderate stage: Secondary | ICD-10-CM | POA: Diagnosis not present

## 2016-03-01 ENCOUNTER — Encounter: Payer: PPO | Admitting: Family Medicine

## 2016-03-07 ENCOUNTER — Encounter: Payer: Self-pay | Admitting: Family Medicine

## 2016-03-07 ENCOUNTER — Ambulatory Visit (INDEPENDENT_AMBULATORY_CARE_PROVIDER_SITE_OTHER): Payer: PPO | Admitting: Family Medicine

## 2016-03-07 VITALS — BP 136/82 | HR 81 | Temp 97.9°F | Resp 18 | Ht 66.0 in | Wt 156.5 lb

## 2016-03-07 DIAGNOSIS — E038 Other specified hypothyroidism: Secondary | ICD-10-CM

## 2016-03-07 DIAGNOSIS — I10 Essential (primary) hypertension: Secondary | ICD-10-CM

## 2016-03-07 DIAGNOSIS — R944 Abnormal results of kidney function studies: Secondary | ICD-10-CM

## 2016-03-07 DIAGNOSIS — G4700 Insomnia, unspecified: Secondary | ICD-10-CM | POA: Diagnosis not present

## 2016-03-07 DIAGNOSIS — F411 Generalized anxiety disorder: Secondary | ICD-10-CM | POA: Diagnosis not present

## 2016-03-07 DIAGNOSIS — N951 Menopausal and female climacteric states: Secondary | ICD-10-CM | POA: Diagnosis not present

## 2016-03-07 DIAGNOSIS — Z79899 Other long term (current) drug therapy: Secondary | ICD-10-CM

## 2016-03-07 DIAGNOSIS — E785 Hyperlipidemia, unspecified: Secondary | ICD-10-CM | POA: Diagnosis not present

## 2016-03-07 DIAGNOSIS — Z0000 Encounter for general adult medical examination without abnormal findings: Secondary | ICD-10-CM | POA: Diagnosis not present

## 2016-03-07 DIAGNOSIS — E559 Vitamin D deficiency, unspecified: Secondary | ICD-10-CM | POA: Diagnosis not present

## 2016-03-07 DIAGNOSIS — R739 Hyperglycemia, unspecified: Secondary | ICD-10-CM

## 2016-03-07 MED ORDER — ALPRAZOLAM 0.5 MG PO TABS: ORAL_TABLET | ORAL | Status: DC

## 2016-03-07 MED ORDER — LEVOTHYROXINE SODIUM 88 MCG PO TABS: 44.0000 ug | ORAL_TABLET | Freq: Every day | ORAL | Status: DC

## 2016-03-07 MED ORDER — SIMVASTATIN 40 MG PO TABS: 40.0000 mg | ORAL_TABLET | Freq: Every day | ORAL | Status: DC

## 2016-03-07 MED ORDER — ZOLPIDEM TARTRATE 10 MG PO TABS: ORAL_TABLET | ORAL | Status: DC

## 2016-03-07 NOTE — Patient Instructions (Signed)
  Ms. Tassy , Thank you for taking time to come for your Medicare Wellness Visit. I appreciate your ongoing commitment to your health goals. Please review the following plan we discussed and let me know if I can assist you in the future.   These are the goals we discussed:  -eat 6 cups of fruit and vegetables daily -eat fish twice weekly   This is a list of the screening recommended for you and due dates:  Health Maintenance  Topic Date Due  . Flu Shot  07/19/2016  . Mammogram  05/05/2017  . Colon Cancer Screening  05/28/2020  . Tetanus Vaccine  08/18/2020  . DEXA scan (bone density measurement)  Completed  . Shingles Vaccine  Completed  .  Hepatitis C: One time screening is recommended by Center for Disease Control  (CDC) for  adults born from [birth year] through 1963.   Completed  . Pneumonia vaccines  Completed

## 2016-03-07 NOTE — Progress Notes (Signed)
Name: Dawn Fuentes   MRN: [id number]    DOB: December 12, 1946   Date:03/07/2016       Progress Note  Subjective  Chief Complaint  Chief Complaint  Patient presents with  . Annual Exam    HPI  Functional ability/safety issues: No Issues Hearing issues: Addressed  Activities of daily living: Discussed Home safety issues: No Issues  End Of Life Planning: Offered verbal information regarding advanced directives, healthcare power of attorney.  Preventative care, Health maintenance, Preventative health measures discussed.  Preventative screenings discussed today: lab work, colonoscopy, PAP, mammogram, DEXA.  Low Dose CT Chest recommended if Age 35-80 years, 30 pack-year currently smoking OR have quit w/in 15years.   Lifestyle risk factor issued reviewed: Diet, exercise, weight management, advised patient smoking is not healthy, nutrition/diet.  Preventative health measures discussed (5-10 year plan).  Reviewed and recommended vaccinations: - Pneumovax  - Prevnar  - Annual Influenza - Zostavax - Tdap   Depression screening: Done Fall risk screening: Done Discuss ADLs/IADLs: Done  Current medical providers: See HPI  Other health risk factors identified this visit: No other issues Cognitive impairment issues: None identified  All above discussed with patient. Appropriate education, counseling and referral will be made based upon the above.   HTN: her bp is at goal, she denies dizziness, chest pain or palpitation.  Hypothyroidism: she is compliant with medication, denies constipation, no dry skin, no fatigue,  Lost one pound since last visit   Insomnia: she is taking Ambien half to one pill daily, medication works well for her, no side effects  Anxiety: is mild now, adjusted on being single. She has girlfriends and church friends. She also watches her 30 yo grand-daughter from school every day.   Hot Flashes: doing better on Clonidine qpm   Hyperglycemia: hgbA1C has  gradually gone up, she denies polyphagia, polydipsia or polyuria.   Patient Active Problem List   Diagnosis Date Noted  . Cystitis 10/12/2015  . Anxiety state 08/21/2015  . Benign hypertension 08/21/2015  . Insomnia, persistent 08/21/2015  . Dyslipidemia 08/21/2015  . Gastro-esophageal reflux disease without esophagitis 08/21/2015  . Glaucoma 08/21/2015  . Blood glucose elevated 08/21/2015  . Adult hypothyroidism 08/21/2015  . Climacteric 08/21/2015  . Senile osteoporosis 08/21/2015  . Seasonal allergies 08/21/2015  . Female genuine stress incontinence 08/21/2015  . Vitamin D deficiency 08/21/2015    Past Surgical History  Procedure Laterality Date  . Abdominal hysterectomy    . Eye surgery    . Ovarian cyst removal    . Colonoscopy with propofol N/A 05/29/2015    Procedure: COLONOSCOPY WITH PROPOFOL;  Surgeon: Hulen Luster, MD;  Location[facility] ENDOSCOPY;  Service: Gastroenterology;  Laterality: N/A;  . Esophagogastroduodenoscopy N/A 05/29/2015    Procedure: ESOPHAGOGASTRODUODENOSCOPY (EGD);  Surgeon: Hulen Luster, MD;  Location: [facility] ENDOSCOPY;  Service: Gastroenterology;  Laterality: N/A;  . Appendectomy      Family History  Problem Relation Age of Onset  . Hypertension Mother   . Osteoporosis Mother   . COPD Mother   . Dementia Mother   . Cancer Father     Colon    Social History   Social History  . Marital Status: Married    Spouse Name: N/A  . Number of Children: N/A  . Years of Education: N/A   Occupational History  . Not on file.   Social History Main Topics  . Smoking status: Never Smoker   . Smokeless tobacco: Never Used  . Alcohol Use: No  .  Drug Use: No  . Sexual Activity: Not Currently   Other Topics Concern  . Not on file   Social History Narrative     Current outpatient prescriptions:  .  ALPRAZolam (XANAX) 0.5 MG tablet, TAKE 1 TABLET BY MOUTH UP TO FOUR TIMES DAILY AS NEEDED FOR ANXIETY, Disp: 60 tablet, Rfl: 0 .  aspirin 81 MG tablet, Take  81 mg by mouth daily., Disp: , Rfl:  .  cholecalciferol (VITAMIN D) 1000 units tablet, Take 1,000 Units by mouth daily., Disp: , Rfl:  .  cloNIDine (CATAPRES) 0.1 MG tablet, Take 1 tablet (0.1 mg total) by mouth daily., Disp: 90 tablet, Rfl: 3 .  fluticasone (FLONASE) 50 MCG/ACT nasal spray, Place 2 sprays into both nostrils daily., Disp: 16 g, Rfl: 6 .  levothyroxine (SYNTHROID, LEVOTHROID) 88 MCG tablet, Take 0.5-1 tablets (44-88 mcg total) by mouth daily before breakfast. Monday and Wednesday 1 tablet. Tues, thurs, fri, sat, and sun take 1/2 tablet, Disp: 90 tablet, Rfl: 1 .  loratadine (CLARITIN) 10 MG tablet, Take 10 mg by mouth daily as needed for allergies., Disp: , Rfl:  .  losartan (COZAAR) 100 MG tablet, Take 1 tablet (100 mg total) by mouth daily., Disp: 90 tablet, Rfl: 3 .  montelukast (SINGULAIR) 10 MG tablet, Take 1 tablet (10 mg total) by mouth daily as needed (allergies)., Disp: 90 tablet, Rfl: 3 .  pantoprazole (PROTONIX) 40 MG tablet, Take 1 tablet (40 mg total) by mouth daily., Disp: 90 tablet, Rfl: 3 .  PAZEO 0.7 % SOLN, INSTILL 1 GTT IN OU QD, Disp: , Rfl: 6 .  polyethylene glycol powder (GLYCOLAX/MIRALAX) powder, , Disp: , Rfl:  .  simvastatin (ZOCOR) 40 MG tablet, Take 1 tablet (40 mg total) by mouth daily., Disp: 90 tablet, Rfl: 1 .  timolol (BETIMOL) 0.25 % ophthalmic solution, 1-2 drops 2 (two) times daily., Disp: , Rfl:  .  zolpidem (AMBIEN) 10 MG tablet, TAKE 1/2 TO 1 TABLET BY MOUTH EVERY NIGHT AT BEDTIME AS NEEDED FOR SLEEP, Disp: 90 tablet, Rfl: 0  Allergies  Allergen Reactions  . Raloxifene     bone pain  . Sulfa Antibiotics Nausea Only    Intolerance rather than allergy.     ROS  Constitutional: Negative for fever or weight change.  Respiratory: Negative for cough and shortness of breath.   Cardiovascular: Negative for chest pain or palpitations.  Gastrointestinal: Negative for abdominal pain, no bowel changes.  Musculoskeletal: Negative for gait problem  or joint swelling.  Skin: Negative for rash.  Neurological: Negative for dizziness or headache.  No other specific complaints in a complete review of systems (except as listed in HPI above).  Objective  Filed Vitals:   03/07/16 1112  BP: 136/82  Pulse: 81  Temp: 97.9 F (36.6 C)  TempSrc: Oral  Resp: 18  Height: 5\' 6"  (1.676 m)  Weight: 156 lb 8 oz (70.988 kg)  SpO2: 98%    Body mass index is 25.27 kg/(m^2).  Physical Exam    Constitutional: Patient appears well-developed and well-nourished. No distress.  HENT: Head: Normocephalic and atraumatic. Ears: B TMs ok, no erythema or effusion; Nose: Nose normal. Mouth/Throat: Oropharynx is clear and moist. No oropharyngeal exudate.  Eyes: Conjunctivae and EOM are normal. Pupils are equal, round, and reactive to light. No scleral icterus.  Neck: Normal range of motion. Neck supple. No JVD present. No thyromegaly present.  Cardiovascular: Normal rate, regular rhythm and normal heart sounds.  No murmur heard. No BLE  edema. Pulmonary/Chest: Effort normal and breath sounds normal. No respiratory distress. Abdominal: Soft. Bowel sounds are normal, no distension. There is no tenderness. no masses Breast: no lumps or masses, no nipple discharge or rashes FEMALE GENITALIA:  Not done RECTAL: not done Musculoskeletal: Normal range of motion, no joint effusions. No gross deformities Neurological: he is alert and oriented to person, place, and time. No cranial nerve deficit. Coordination, balance, strength, speech and gait are normal.  Skin: Skin is warm and dry. No rash noted. No erythema.  Psychiatric: Patient has a normal mood and affect. behavior is normal. Judgment and thought content normal.  PHQ2/9: Depression screen [facility] 2/9 03/07/2016 08/25/2015  Decreased Interest 0 0  Down, Depressed, Hopeless 0 0  PHQ - 2 Score 0 0     Fall Risk: Fall Risk  03/07/2016 08/25/2015  Falls in the past year? No No     Functional Status Survey: Is  the patient deaf or have difficulty hearing?: No Does the patient have difficulty seeing, even when wearing glasses/contacts?: No Does the patient have difficulty concentrating, remembering, or making decisions?: No Does the patient have difficulty walking or climbing stairs?: No Does the patient have difficulty dressing or bathing?: No Does the patient have difficulty doing errands alone such as visiting a doctor's office or shopping?: No   Assessment & Plan  1. Medicare annual wellness visit, subsequent  Discussed importance of 150 minutes of physical activity weekly, eat two servings of fish weekly, eat one serving of tree nuts ( cashews, pistachios, pecans, almonds.Marland Kitchen) every other day, eat 6 servings of fruit/vegetables daily and drink plenty of water and avoid sweet beverages.   2. Dyslipidemia  - simvastatin (ZOCOR) 40 MG tablet; Take 1 tablet (40 mg total) by mouth daily.  Dispense: 90 tablet; Refill: 1 - Lipid panel - AST - ALT  3. Other specified hypothyroidism  - levothyroxine (SYNTHROID, LEVOTHROID) 88 MCG tablet; Take 0.5-1 tablets (44-88 mcg total) by mouth daily before breakfast. Monday and Wednesday 1 tablet. Tues, thurs, fri, sat, and sun take 1/2 tablet  Dispense: 90 tablet; Refill: 1  4. Climacteric  Doing well on Clonidine qhs and seems to be helping on medication  5. Benign hypertension  Well controlled on medication   6. Insomnia, persistent  - zolpidem (AMBIEN) 10 MG tablet; TAKE 1/2 TO 1 TABLET BY MOUTH EVERY NIGHT AT BEDTIME AS NEEDED FOR SLEEP  Dispense: 90 tablet; Refill: 0  7. Vitamin D deficiency  - VITAMIN D 25 Hydroxy (Vit-D Deficiency, Fractures)  8. Blood glucose elevated  - Hemoglobin A1c  9. Anxiety state  - ALPRAZolam (XANAX) 0.5 MG tablet; TAKE 1 TABLET BY MOUTH UP TO FOUR TIMES DAILY AS NEEDED FOR ANXIETY  Dispense: 60 tablet; Refill: 0  10. Decreased GFR  - Estimated GFR  11. Long-term use of high-risk medication  - AST -  ALT

## 2016-03-15 ENCOUNTER — Telehealth: Payer: Self-pay | Admitting: Family Medicine

## 2016-03-15 DIAGNOSIS — J209 Acute bronchitis, unspecified: Secondary | ICD-10-CM | POA: Diagnosis not present

## 2016-03-15 NOTE — Telephone Encounter (Signed)
I don't call in antibiotics. Please triage again. Explain to her there are a lot of viral illness going on. She can also try netipot.

## 2016-03-15 NOTE — Telephone Encounter (Signed)
I read to the patient what was written by the doctor and the patient said okay that she may just go to a walk in.

## 2016-03-15 NOTE — Telephone Encounter (Signed)
PT SAID THAT SHE SEEN YOU LAST WEEK AND WAS FINE NOW SHE IS RUNNING FEVER 100.6 LASTNIGHT AND HER OTHER SYMPTOMS ARE CONGESTION, COUGH A LITTLE BIT ( IT IS GREEN IN COLOR)  OF PRESSURE UNDER LEFT EYE . SHE IS TAKING MUCNEX GUAIF EXTENDED RELEASE 600MG . WANTS TO KNOW IF MAYBE IF YOU COULD CALL HER SOMETHING IN SINCE YOU ARE OVER BOOKED. THINKS SHE CAN NOT WAIT TILL Thursday.

## 2016-03-25 ENCOUNTER — Other Ambulatory Visit: Payer: Self-pay

## 2016-03-25 DIAGNOSIS — H401132 Primary open-angle glaucoma, bilateral, moderate stage: Secondary | ICD-10-CM | POA: Diagnosis not present

## 2016-03-25 MED ORDER — BENZONATATE 100 MG PO CAPS: 100.0000 mg | ORAL_CAPSULE | Freq: Three times a day (TID) | ORAL | Status: DC

## 2016-03-25 MED ORDER — FLUTICASONE FUROATE-VILANTEROL 100-25 MCG/INH IN AEPB: 1.0000 | INHALATION_SPRAY | Freq: Every day | RESPIRATORY_TRACT | Status: DC

## 2016-04-05 DIAGNOSIS — E785 Hyperlipidemia, unspecified: Secondary | ICD-10-CM | POA: Diagnosis not present

## 2016-04-05 DIAGNOSIS — R944 Abnormal results of kidney function studies: Secondary | ICD-10-CM | POA: Diagnosis not present

## 2016-04-05 DIAGNOSIS — E559 Vitamin D deficiency, unspecified: Secondary | ICD-10-CM | POA: Diagnosis not present

## 2016-04-05 DIAGNOSIS — E038 Other specified hypothyroidism: Secondary | ICD-10-CM | POA: Diagnosis not present

## 2016-04-05 DIAGNOSIS — R739 Hyperglycemia, unspecified: Secondary | ICD-10-CM | POA: Diagnosis not present

## 2016-04-05 DIAGNOSIS — Z79899 Other long term (current) drug therapy: Secondary | ICD-10-CM | POA: Diagnosis not present

## 2016-04-06 ENCOUNTER — Encounter: Payer: Self-pay | Admitting: Family Medicine

## 2016-04-06 DIAGNOSIS — N183 Chronic kidney disease, stage 3 unspecified: Secondary | ICD-10-CM | POA: Insufficient documentation

## 2016-04-06 DIAGNOSIS — H401132 Primary open-angle glaucoma, bilateral, moderate stage: Secondary | ICD-10-CM | POA: Diagnosis not present

## 2016-04-06 LAB — ALT: ALT: 23 [IU]/L (ref 0–32)

## 2016-04-06 LAB — GLOM FILT RATE, ESTIMATED
Creatinine, Ser: 1.13 mg/dL — ABNORMAL HIGH (ref 0.57–1.00)
GFR calc Af Amer: 58 mL/min/{1.73_m2} — ABNORMAL LOW
GFR calc non Af Amer: 50 mL/min/{1.73_m2} — ABNORMAL LOW

## 2016-04-06 LAB — LIPID PANEL
Chol/HDL Ratio: 2.6 ratio (ref 0.0–4.4)
Cholesterol, Total: 144 mg/dL (ref 100–199)
HDL: 55 mg/dL
LDL Calculated: 65 mg/dL (ref 0–99)
Triglycerides: 122 mg/dL (ref 0–149)
VLDL Cholesterol Cal: 24 mg/dL (ref 5–40)

## 2016-04-06 LAB — HGB A1C W/O EAG: Hgb A1c MFr Bld: 6.4 % — ABNORMAL HIGH (ref 4.8–5.6)

## 2016-04-06 LAB — TSH: TSH: 4.56 u[IU]/mL — ABNORMAL HIGH (ref 0.450–4.500)

## 2016-04-06 LAB — VITAMIN D 25 HYDROXY (VIT D DEFICIENCY, FRACTURES): Vit D, 25-Hydroxy: 37.2 ng/mL (ref 30.0–100.0)

## 2016-04-06 LAB — AST: AST: 23 [IU]/L (ref 0–40)

## 2016-04-07 ENCOUNTER — Other Ambulatory Visit: Payer: Self-pay | Admitting: Family Medicine

## 2016-04-07 DIAGNOSIS — E038 Other specified hypothyroidism: Secondary | ICD-10-CM

## 2016-04-07 MED ORDER — LEVOTHYROXINE SODIUM 88 MCG PO TABS: 44.0000 ug | ORAL_TABLET | Freq: Every day | ORAL | Status: DC

## 2016-04-08 ENCOUNTER — Other Ambulatory Visit: Payer: Self-pay

## 2016-04-08 DIAGNOSIS — E038 Other specified hypothyroidism: Secondary | ICD-10-CM

## 2016-04-12 DIAGNOSIS — N399 Disorder of urinary system, unspecified: Secondary | ICD-10-CM | POA: Diagnosis not present

## 2016-05-17 DIAGNOSIS — Z1231 Encounter for screening mammogram for malignant neoplasm of breast: Secondary | ICD-10-CM | POA: Diagnosis not present

## 2016-05-23 DIAGNOSIS — E038 Other specified hypothyroidism: Secondary | ICD-10-CM | POA: Diagnosis not present

## 2016-05-24 LAB — TSH: TSH: 0.465 u[IU]/mL (ref 0.450–4.500)

## 2016-05-30 ENCOUNTER — Other Ambulatory Visit: Payer: Self-pay | Admitting: Family Medicine

## 2016-05-31 NOTE — Telephone Encounter (Signed)
Patient requesting refill. 

## 2016-07-12 ENCOUNTER — Other Ambulatory Visit: Payer: Self-pay | Admitting: Family Medicine

## 2016-07-12 DIAGNOSIS — G4700 Insomnia, unspecified: Secondary | ICD-10-CM

## 2016-08-10 DIAGNOSIS — M436 Torticollis: Secondary | ICD-10-CM | POA: Diagnosis not present

## 2016-08-10 DIAGNOSIS — M9901 Segmental and somatic dysfunction of cervical region: Secondary | ICD-10-CM | POA: Diagnosis not present

## 2016-08-10 DIAGNOSIS — H401132 Primary open-angle glaucoma, bilateral, moderate stage: Secondary | ICD-10-CM | POA: Diagnosis not present

## 2016-09-07 ENCOUNTER — Ambulatory Visit (INDEPENDENT_AMBULATORY_CARE_PROVIDER_SITE_OTHER): Payer: PPO | Admitting: Family Medicine

## 2016-09-07 ENCOUNTER — Encounter: Payer: Self-pay | Admitting: Family Medicine

## 2016-09-07 VITALS — BP 138/74 | HR 93 | Temp 98.6°F | Resp 18 | Ht 66.0 in | Wt 153.2 lb

## 2016-09-07 DIAGNOSIS — Z23 Encounter for immunization: Secondary | ICD-10-CM

## 2016-09-07 DIAGNOSIS — E038 Other specified hypothyroidism: Secondary | ICD-10-CM

## 2016-09-07 DIAGNOSIS — G4700 Insomnia, unspecified: Secondary | ICD-10-CM | POA: Diagnosis not present

## 2016-09-07 DIAGNOSIS — Z79899 Other long term (current) drug therapy: Secondary | ICD-10-CM | POA: Diagnosis not present

## 2016-09-07 DIAGNOSIS — I10 Essential (primary) hypertension: Secondary | ICD-10-CM

## 2016-09-07 DIAGNOSIS — E785 Hyperlipidemia, unspecified: Secondary | ICD-10-CM | POA: Diagnosis not present

## 2016-09-07 DIAGNOSIS — N183 Chronic kidney disease, stage 3 unspecified: Secondary | ICD-10-CM

## 2016-09-07 DIAGNOSIS — R7303 Prediabetes: Secondary | ICD-10-CM

## 2016-09-07 DIAGNOSIS — K219 Gastro-esophageal reflux disease without esophagitis: Secondary | ICD-10-CM | POA: Diagnosis not present

## 2016-09-07 DIAGNOSIS — R202 Paresthesia of skin: Secondary | ICD-10-CM

## 2016-09-07 DIAGNOSIS — F411 Generalized anxiety disorder: Secondary | ICD-10-CM

## 2016-09-07 DIAGNOSIS — R739 Hyperglycemia, unspecified: Secondary | ICD-10-CM

## 2016-09-07 DIAGNOSIS — E559 Vitamin D deficiency, unspecified: Secondary | ICD-10-CM | POA: Diagnosis not present

## 2016-09-07 LAB — CBC WITH DIFFERENTIAL/PLATELET
Basophils Absolute: 76 {cells}/uL (ref 0–200)
Basophils Relative: 1 %
Eosinophils Absolute: 304 {cells}/uL (ref 15–500)
Eosinophils Relative: 4 %
HCT: 43.4 % (ref 35.0–45.0)
Hemoglobin: 14.3 g/dL (ref 11.7–15.5)
Lymphocytes Relative: 18 %
Lymphs Abs: 1368 {cells}/uL (ref 850–3900)
MCH: 30.4 pg (ref 27.0–33.0)
MCHC: 32.9 g/dL (ref 32.0–36.0)
MCV: 92.1 fL (ref 80.0–100.0)
MPV: 11 fL (ref 7.5–12.5)
Monocytes Absolute: 760 {cells}/uL (ref 200–950)
Monocytes Relative: 10 %
Neutro Abs: 5092 {cells}/uL (ref 1500–7800)
Neutrophils Relative %: 67 %
Platelets: 300 10*3/uL (ref 140–400)
RBC: 4.71 MIL/uL (ref 3.80–5.10)
RDW: 13.2 % (ref 11.0–15.0)
WBC: 7.6 10*3/uL (ref 3.8–10.8)

## 2016-09-07 LAB — POCT GLYCOSYLATED HEMOGLOBIN (HGB A1C): Hemoglobin A1C: 5.9

## 2016-09-07 MED ORDER — LOSARTAN POTASSIUM 100 MG PO TABS: 100.0000 mg | ORAL_TABLET | Freq: Every day | ORAL | 3 refills | Status: DC

## 2016-09-07 MED ORDER — SIMVASTATIN 40 MG PO TABS: 40.0000 mg | ORAL_TABLET | Freq: Every day | ORAL | 1 refills | Status: DC

## 2016-09-07 MED ORDER — CLONIDINE HCL 0.1 MG PO TABS: 0.1000 mg | ORAL_TABLET | Freq: Every day | ORAL | 3 refills | Status: DC

## 2016-09-07 MED ORDER — ALPRAZOLAM 0.5 MG PO TABS: 0.5000 mg | ORAL_TABLET | Freq: Every day | ORAL | 0 refills | Status: DC | PRN

## 2016-09-07 NOTE — Progress Notes (Signed)
Name: Dawn Fuentes   MRN: [id number]    DOB: [birth year]   Date:09/07/2016       Progress Note  Subjective  Chief Complaint  Chief Complaint  Patient presents with  . Follow-up    6 mnth follow up, Medication reaction concern     HPI  HTN: her bp is at goal, she denies dizziness, chest pain or palpitation.  Hypothyroidism: she is compliant with medication, denies constipation, no dry skin, no fatigue,  Lost 4  pound since last visit   Insomnia: she is taking Ambien half to one pill daily, medication works well for her, no side effects  Anxiety: is mild now, adjusted on being single. She has girlfriends and church friends. She also watches her 59 yo grand-daughter from school every day. She seems to be getting comfortable using alprazolam, discussed risk of addiction  Hot Flashes: doing better on Clonidine qpm   GERD : she has occasional heart burn , but is doing well most of the time.  Hyperlipidemia: taking simvastatin and denies side effects of medication, no myalgia  Hyperglycemia: hgbA1C has gradually gone up, she denies polyphagia, polydipsia or polyuria. She stopped drinking colas because the oral medication given for glaucoma has caused it to taste bitter, also decreasing her appetite.  Glaucoma: she is taking medication and had some laser therapy but pressure was still high, having some side effects of medication  Paresthesia: she has noticed a numbness sensation on left lateral calf, states symptoms is intermittent and no functional problems, it started about one month ago. Explained likely a superficial nerve entrapment   CKI III: going on for the past year, does not take nsaid's, we will continue to monitor   Patient Active Problem List   Diagnosis Date Noted  . Chronic kidney disease (CKD), stage III (moderate) 04/06/2016  . Cystitis 10/12/2015  . Anxiety state 08/21/2015  . Benign hypertension 08/21/2015  . Insomnia, persistent 08/21/2015  . Dyslipidemia  08/21/2015  . Gastro-esophageal reflux disease without esophagitis 08/21/2015  . Glaucoma 08/21/2015  . Blood glucose elevated 08/21/2015  . Adult hypothyroidism 08/21/2015  . Climacteric 08/21/2015  . Senile osteoporosis 08/21/2015  . Seasonal allergies 08/21/2015  . Female genuine stress incontinence 08/21/2015  . Vitamin D deficiency 08/21/2015    Past Surgical History:  Procedure Laterality Date  . ABDOMINAL HYSTERECTOMY    . APPENDECTOMY    . COLONOSCOPY WITH PROPOFOL N/A 05/29/2015   Procedure: COLONOSCOPY WITH PROPOFOL;  Surgeon: Hulen Luster, MD;  Location: [facility] ENDOSCOPY;  Service: Gastroenterology;  Laterality: N/A;  . ESOPHAGOGASTRODUODENOSCOPY N/A 05/29/2015   Procedure: ESOPHAGOGASTRODUODENOSCOPY (EGD);  Surgeon: Hulen Luster, MD;  Location: [facility] ENDOSCOPY;  Service: Gastroenterology;  Laterality: N/A;  . EYE SURGERY    . OVARIAN CYST REMOVAL      Family History  Problem Relation Age of Onset  . Hypertension Mother   . Osteoporosis Mother   . COPD Mother   . Dementia Mother   . Cancer Father     Colon    Social History   Social History  . Marital status: Married    Spouse name: N/A  . Number of children: N/A  . Years of education: N/A   Occupational History  . Not on file.   Social History Main Topics  . Smoking status: Never Smoker  . Smokeless tobacco: Never Used  . Alcohol use No  . Drug use: No  . Sexual activity: Not Currently   Other Topics Concern  . Not  on file   Social History Narrative  . No narrative on file     Current Outpatient Prescriptions:  .  ALPRAZolam (XANAX) 0.5 MG tablet, TAKE 1 TABLET BY MOUTH UP TO 4 TIMES DAILY AS NEEDED FOR ANXIETY, Disp: 60 tablet, Rfl: 0 .  aspirin 81 MG tablet, Take 81 mg by mouth daily., Disp: , Rfl:  .  cholecalciferol (VITAMIN D) 1000 units tablet, Take 1,000 Units by mouth daily., Disp: , Rfl:  .  cloNIDine (CATAPRES) 0.1 MG tablet, Take 1 tablet (0.1 mg total) by mouth daily., Disp: 90 tablet, Rfl:  3 .  fluticasone (FLONASE) 50 MCG/ACT nasal spray, Place 2 sprays into both nostrils daily., Disp: 16 g, Rfl: 6 .  levothyroxine (SYNTHROID, LEVOTHROID) 88 MCG tablet, Take 0.5-1 tablets (44-88 mcg total) by mouth daily before breakfast. Monday and Wednesday , Friday 1 tablet. Tues, thurs,  sat, and sun take 1/2 tablet, Disp: 90 tablet, Rfl: 1 .  loratadine (CLARITIN) 10 MG tablet, Take 10 mg by mouth daily as needed for allergies., Disp: , Rfl:  .  losartan (COZAAR) 100 MG tablet, Take 1 tablet (100 mg total) by mouth daily., Disp: 90 tablet, Rfl: 3 .  methazolamide (NEPTAZANE) 50 MG tablet, TK 1 T PO BID, Disp: , Rfl: 2 .  montelukast (SINGULAIR) 10 MG tablet, Take 1 tablet (10 mg total) by mouth daily as needed (allergies)., Disp: 90 tablet, Rfl: 3 .  pantoprazole (PROTONIX) 40 MG tablet, Take 1 tablet (40 mg total) by mouth daily., Disp: 90 tablet, Rfl: 3 .  PAZEO 0.7 % SOLN, INSTILL 1 GTT IN OU QD, Disp: , Rfl: 6 .  polyethylene glycol powder (GLYCOLAX/MIRALAX) powder, , Disp: , Rfl:  .  simvastatin (ZOCOR) 40 MG tablet, Take 1 tablet (40 mg total) by mouth daily., Disp: 90 tablet, Rfl: 1 .  timolol (BETIMOL) 0.25 % ophthalmic solution, 1-2 drops 2 (two) times daily., Disp: , Rfl:  .  zolpidem (AMBIEN) 10 MG tablet, TAKE 1 TABLET BY MOUTH EVERY DAY AT BEDTIME, Disp: 90 tablet, Rfl: 0  Allergies  Allergen Reactions  . Raloxifene     bone pain  . Sulfa Antibiotics Nausea Only    Intolerance rather than allergy.     ROS  Constitutional: Negative for fever and she has mild  weight change.  Respiratory: Negative for cough and shortness of breath.   Cardiovascular: Negative for chest pain or palpitations.  Gastrointestinal: Negative for abdominal pain, no bowel changes.  Musculoskeletal: Negative for gait problem or joint swelling.  Skin: Negative for rash.  Neurological: Negative for dizziness or headache.  No other specific complaints in a complete review of systems (except as listed  in HPI above).  Objective  Vitals:   09/07/16 1104  BP: 138/74  Pulse: 93  Resp: 18  Temp: 98.6 F (37 C)  TempSrc: Oral  SpO2: 98%  Weight: 153 lb 4 oz (69.5 kg)  Height: 5\' 6"  (1.676 m)    Body mass index is 24.74 kg/m.  Physical Exam  Constitutional: Patient appears well-developed and well-nourished.  No distress.  HEENT: head atraumatic, normocephalic, pupils equal and reactive to light, neck supple, throat within normal limits Cardiovascular: Normal rate, regular rhythm and normal heart sounds.  No murmur heard. No BLE edema. Pulmonary/Chest: Effort normal and breath sounds normal. No respiratory distress. Abdominal: Soft.  There is no tenderness. Psychiatric: Patient has a normal mood and affect. behavior is normal. Judgment and thought content normal.  PHQ2/9: Depression screen PHQ  2/9 09/07/2016 03/07/2016 08/25/2015  Decreased Interest 0 0 0  Down, Depressed, Hopeless 0 0 0  PHQ - 2 Score 0 0 0     Fall Risk: Fall Risk  09/07/2016 03/07/2016 08/25/2015  Falls in the past year? No No No     Functional Status Survey: Is the patient deaf or have difficulty hearing?: No Does the patient have difficulty seeing, even when wearing glasses/contacts?: Yes Does the patient have difficulty concentrating, remembering, or making decisions?: No Does the patient have difficulty walking or climbing stairs?: No Does the patient have difficulty dressing or bathing?: No Does the patient have difficulty doing errands alone such as visiting a doctor's office or shopping?: No    Assessment & Plan  1. Other specified hypothyroidism  - TSH  2. Dyslipidemia  - simvastatin (ZOCOR) 40 MG tablet; Take 1 tablet (40 mg total) by mouth daily.  Dispense: 90 tablet; Refill: 1  3. Benign hypertension  - COMPLETE METABOLIC PANEL WITH GFR - CBC with Differential/Platelet - cloNIDine (CATAPRES) 0.1 MG tablet; Take 1 tablet (0.1 mg total) by mouth daily.  Dispense: 90 tablet; Refill:  3  4. Vitamin D deficiency   5. Insomnia, persistent  Advised to take Ambien 5 mg only because of FDA, she usually does only take half dose and not every night  6. Gastro-esophageal reflux disease without esophagitis  - losartan (COZAAR) 100 MG tablet; Take 1 tablet (100 mg total) by mouth daily.  Dispense: 90 tablet; Refill: 3  7. Paresthesia of left leg  - Vitamin [name] - CBC with Differential/Platelet  8. Chronic kidney disease, stage 3  Recheck labs  9. Long-term use of high-risk medication  - COMPLETE METABOLIC PANEL WITH GFR  10. Needs flu shot  - Flu vaccine HIGH DOSE PF  11. Anxiety state  Explained that she is getting too comfortable taking alprazolam, she needs to try mindful breathing, go for walks, talk to a friend and only take it if necessary  - ALPRAZolam (XANAX) 0.5 MG tablet; Take 1 tablet (0.5 mg total) by mouth daily as needed for anxiety. Must last 90 days  Dispense: 45 tablet; Refill: 0  12. Pre-diabetes  - POCT glycosylated hemoglobin (Hb A1C)  13. Hyperglycemia  - POCT glycosylated hemoglobin (Hb A1C)

## 2016-09-08 LAB — COMPLETE METABOLIC PANEL WITHOUT GFR
ALT: 16 U/L (ref 6–29)
AST: 32 U/L (ref 10–35)
Albumin: 4.5 g/dL (ref 3.6–5.1)
Alkaline Phosphatase: 59 U/L (ref 33–130)
BUN: 18 mg/dL (ref 7–25)
CO2: 22 mmol/L (ref 20–31)
Calcium: 9.8 mg/dL (ref 8.6–10.4)
Chloride: 108 mmol/L (ref 98–110)
Creat: 1.11 mg/dL — ABNORMAL HIGH (ref 0.50–0.99)
GFR, Est African American: 59 mL/min — ABNORMAL LOW
GFR, Est Non African American: 52 mL/min — ABNORMAL LOW
Glucose, Bld: 102 mg/dL — ABNORMAL HIGH (ref 65–99)
Potassium: 4 mmol/L (ref 3.5–5.3)
Sodium: 141 mmol/L (ref 135–146)
Total Bilirubin: 0.7 mg/dL (ref 0.2–1.2)
Total Protein: 7.4 g/dL (ref 6.1–8.1)

## 2016-09-08 LAB — VITAMIN B12: Vitamin B-12: 270 pg/mL (ref 200–1100)

## 2016-09-08 LAB — TSH: TSH: 0.2 m[IU]/L — ABNORMAL LOW

## 2016-09-13 ENCOUNTER — Ambulatory Visit (INDEPENDENT_AMBULATORY_CARE_PROVIDER_SITE_OTHER): Payer: PPO

## 2016-09-13 ENCOUNTER — Ambulatory Visit: Payer: PPO

## 2016-09-13 DIAGNOSIS — E538 Deficiency of other specified B group vitamins: Secondary | ICD-10-CM

## 2016-09-13 MED ORDER — CYANOCOBALAMIN 1000 MCG/ML IJ SOLN
1000.0000 ug | Freq: Once | INTRAMUSCULAR | Status: AC
  Administered 2016-09-13: 1000 ug via INTRAMUSCULAR

## 2016-09-13 NOTE — Progress Notes (Signed)
Patient states she thought about it and wanted to try the injections first.

## 2016-09-14 DIAGNOSIS — H401132 Primary open-angle glaucoma, bilateral, moderate stage: Secondary | ICD-10-CM | POA: Diagnosis not present

## 2016-09-17 ENCOUNTER — Encounter: Payer: Self-pay | Admitting: Emergency Medicine

## 2016-09-17 ENCOUNTER — Emergency Department
Admission: EM | Admit: 2016-09-17 | Discharge: 2016-09-17 | Disposition: A | Discharge status: Home / Self Care | Payer: PPO | Attending: Emergency Medicine | Admitting: Emergency Medicine

## 2016-09-17 DIAGNOSIS — I129 Hypertensive chronic kidney disease with stage 1 through stage 4 chronic kidney disease, or unspecified chronic kidney disease: Secondary | ICD-10-CM | POA: Insufficient documentation

## 2016-09-17 DIAGNOSIS — Z7982 Long term (current) use of aspirin: Secondary | ICD-10-CM | POA: Insufficient documentation

## 2016-09-17 DIAGNOSIS — E039 Hypothyroidism, unspecified: Secondary | ICD-10-CM | POA: Insufficient documentation

## 2016-09-17 DIAGNOSIS — N183 Chronic kidney disease, stage 3 (moderate): Secondary | ICD-10-CM | POA: Insufficient documentation

## 2016-09-17 DIAGNOSIS — F419 Anxiety disorder, unspecified: Secondary | ICD-10-CM

## 2016-09-17 DIAGNOSIS — Z0000 Encounter for general adult medical examination without abnormal findings: Secondary | ICD-10-CM | POA: Insufficient documentation

## 2016-09-17 DIAGNOSIS — Z79899 Other long term (current) drug therapy: Secondary | ICD-10-CM | POA: Insufficient documentation

## 2016-09-17 DIAGNOSIS — Z139 Encounter for screening, unspecified: Secondary | ICD-10-CM

## 2016-09-17 LAB — BASIC METABOLIC PANEL WITH GFR
Anion gap: 7 (ref 5–15)
BUN: 15 mg/dL (ref 6–20)
CO2: 25 mmol/L (ref 22–32)
Calcium: 9.7 mg/dL (ref 8.9–10.3)
Chloride: 107 mmol/L (ref 101–111)
Creatinine, Ser: 1.07 mg/dL — ABNORMAL HIGH (ref 0.44–1.00)
GFR calc Af Amer: >60 mL/min
GFR calc non Af Amer: 52 mL/min — ABNORMAL LOW
Glucose, Bld: 127 mg/dL — ABNORMAL HIGH (ref 65–99)
Potassium: 3.7 mmol/L (ref 3.5–5.1)
Sodium: 139 mmol/L (ref 135–145)

## 2016-09-17 LAB — CBC
HCT: 40.9 % (ref 35.0–47.0)
Hemoglobin: 14.2 g/dL (ref 12.0–16.0)
MCH: 31.1 pg (ref 26.0–34.0)
MCHC: 34.8 g/dL (ref 32.0–36.0)
MCV: 89.5 fL (ref 80.0–100.0)
Platelets: 255 10*3/uL (ref 150–440)
RBC: 4.57 MIL/uL (ref 3.80–5.20)
RDW: 13.1 % (ref 11.5–14.5)
WBC: 6.5 10*3/uL (ref 3.6–11.0)

## 2016-09-17 LAB — URINALYSIS COMPLETE WITH MICROSCOPIC (ARMC ONLY)
Bacteria, UA: NONE SEEN
Bilirubin Urine: NEGATIVE
Glucose, UA: NEGATIVE mg/dL
Ketones, ur: NEGATIVE mg/dL
Leukocytes, UA: NEGATIVE
Nitrite: NEGATIVE
Protein, ur: NEGATIVE mg/dL
Specific Gravity, Urine: 1.005 (ref 1.005–1.030)
pH: 5 (ref 5.0–8.0)

## 2016-09-17 LAB — GLUCOSE, CAPILLARY: Glucose-Capillary: 114 mg/dL — ABNORMAL HIGH (ref 65–99)

## 2016-09-17 LAB — TROPONIN I: Troponin I: <0.03 ng/mL

## 2016-09-17 MED ORDER — DIAZEPAM 2 MG PO TABS
2.0000 mg | ORAL_TABLET | Freq: Once | ORAL | Status: AC
  Administered 2016-09-17: 2 mg via ORAL
  Filled 2016-09-17: qty 1

## 2016-09-17 NOTE — ED Triage Notes (Signed)
Pt presents to ED c/o feeling weak and shaky/anxious. Recently stopped a new medication for glaucoma problems and thinks the problem is due to med changes. Denies n/v/d, denies dizziness/lightheadedness.

## 2016-09-17 NOTE — ED Triage Notes (Signed)
States she took her PRN xanax today with some relief but shakiness returned after it wore off.

## 2016-09-17 NOTE — ED Provider Notes (Signed)
[facility] Emergency Department Provider Note   ____________________________________________   First MD Initiated Contact with Patient 09/17/16 1811     (approximate)  I have reviewed the triage vital signs and the nursing notes.   HISTORY  Chief Complaint Weakness and Dizziness   HPI Dawn Fuentes is a 70 y.o. female With a  History of anxiety as well as hy with feeling jittery and over the past week. She is denying any pain. Denies any shortness of breath. Says that she began feeling odd 1 month ago when she began taking methazolamide for glaucoma.One week ago she was taken off the medication for presumed side effects. She also had her levothyroxine adjusted after having labs checked by her primary care doctor. However, she feels increasingly anxious today. She took Xanax, hich did not appear to help.  She denies any stressful events in her life. She says that she has been taking the Xanax once a day over the past month as needed for anxiety which she is bleeding is from her methazolamide.   Past Medical History:  Diagnosis Date  . Allergy   . Anxiety   . Chronic insomnia   . Colon adenoma   . Dyslipidemia   . Dysphagia   . Female stress incontinence   . GERD (gastroesophageal reflux disease)   . [facility], both eyes    [facility]  . Hyperlipidemia   . Hypertension   . Hypothyroidism   . Menopause syndrome   . Osteoporosis   . Polyp of colon   . Vitamin D deficiency     Patient Active Problem List   Diagnosis Date Noted  . Chronic kidney disease (CKD), stage III (moderate) 04/06/2016  . Cystitis 10/12/2015  . Anxiety state 08/21/2015  . Benign hypertension 08/21/2015  . Insomnia, persistent 08/21/2015  . Dyslipidemia 08/21/2015  . Gastro-esophageal reflux disease without esophagitis 08/21/2015  . Glaucoma 08/21/2015  . Blood glucose elevated 08/21/2015  . Adult hypothyroidism 08/21/2015  . Climacteric  08/21/2015  . Senile osteoporosis 08/21/2015  . Seasonal allergies 08/21/2015  . Female genuine stress incontinence 08/21/2015  . Vitamin D deficiency 08/21/2015    Past Surgical History:  Procedure Laterality Date  . ABDOMINAL HYSTERECTOMY    . APPENDECTOMY    . COLONOSCOPY WITH PROPOFOL N/A 05/29/2015   Procedure: COLONOSCOPY WITH PROPOFOL;  Surgeon: Hulen Luster, MD;  Location: [facility] ENDOSCOPY;  Service: Gastroenterology;  Laterality: N/A;  . ESOPHAGOGASTRODUODENOSCOPY N/A 05/29/2015   Procedure: ESOPHAGOGASTRODUODENOSCOPY (EGD);  Surgeon: Hulen Luster, MD;  Location: [facility] ENDOSCOPY;  Service: Gastroenterology;  Laterality: N/A;  . EYE SURGERY    . OVARIAN CYST REMOVAL      Prior to Admission medications   Medication Sig Start Date End Date Taking? Authorizing Provider  ALPRAZolam Duanne Moron) 0.5 MG tablet Take 1 tablet (0.5 mg total) by mouth daily as needed for anxiety. Must last 90 days 09/07/16   Steele Sizer, MD  aspirin 81 MG tablet Take 81 mg by mouth daily.    Historical Provider, MD  cholecalciferol (VITAMIN D) 1000 units tablet Take 1,000 Units by mouth daily.    Historical Provider, MD  cloNIDine (CATAPRES) 0.1 MG tablet Take 1 tablet (0.1 mg total) by mouth daily. 09/07/16   Steele Sizer, MD  fluticasone (FLONASE) 50 MCG/ACT nasal spray Place 2 sprays into both nostrils daily. 10/12/15   Bobetta Lime, MD  levothyroxine (SYNTHROID, LEVOTHROID) 88 MCG tablet Take 0.5-1 tablets (44-88 mcg total) by mouth daily  before breakfast. Monday and Wednesday , Friday 1 tablet. Tues, thurs,  sat, and sun take 1/2 tablet 04/07/16   Steele Sizer, MD  loratadine (CLARITIN) 10 MG tablet Take 10 mg by mouth daily as needed for allergies.    Historical Provider, MD  losartan (COZAAR) 100 MG tablet Take 1 tablet (100 mg total) by mouth daily. 09/07/16   Steele Sizer, MD  methazolamide (NEPTAZANE) 50 MG tablet TK 1 T PO BID 08/24/16   Historical Provider, MD  montelukast (SINGULAIR) 10 MG tablet Take  1 tablet (10 mg total) by mouth daily as needed (allergies). 08/25/15   Steele Sizer, MD  pantoprazole (PROTONIX) 40 MG tablet Take 1 tablet (40 mg total) by mouth daily. 08/25/15   Steele Sizer, MD  PAZEO 0.7 % SOLN INSTILL 1 GTT IN OU QD 09/11/15   Historical Provider, MD  polyethylene glycol powder (GLYCOLAX/MIRALAX) powder  04/30/15   Historical Provider, MD  simvastatin (ZOCOR) 40 MG tablet Take 1 tablet (40 mg total) by mouth daily. 09/07/16   Steele Sizer, MD  timolol (BETIMOL) 0.25 % ophthalmic solution 1-2 drops 2 (two) times daily.    Historical Provider, MD  zolpidem (AMBIEN) 10 MG tablet TAKE 1 TABLET BY MOUTH EVERY DAY AT BEDTIME 07/12/16   Steele Sizer, MD    Allergies Raloxifene and Sulfa antibiotics  Family History  Problem Relation Age of Onset  . Hypertension Mother   . Osteoporosis Mother   . COPD Mother   . Dementia Mother   . Cancer Father     Colon    Social History Social History  Substance Use Topics  . Smoking status: Never Smoker  . Smokeless tobacco: Never Used  . Alcohol use No    Review of Systems Constitutional: No fever/chills Eyes: No visual changes. ENT: No sore throat. Cardiovascular: Denies chest pain. Respiratory: Denies shortness of breath. Gastrointestinal: No abdominal pain.  No nausea, no vomiting.  No diarrhea.  No constipation. Genitourinary: Negative for dysuria. Musculoskeletal: Negative for back pain. Skin: Negative for rash. Neurological: Negative for headaches, focal weakness or numbness.  10-point ROS otherwise negative.  ____________________________________________   PHYSICAL EXAM:  VITAL SIGNS: ED Triage Vitals  Enc Vitals Group     BP 09/17/16 1716 (!) 154/75     Pulse Rate 09/17/16 1716 92     Resp 09/17/16 1716 18     Temp 09/17/16 1716 98.5 F (36.9 C)     Temp Source 09/17/16 1716 Oral     SpO2 09/17/16 1716 98 %     Weight 09/17/16 1721 153 lb (69.4 kg)     Height 09/17/16 1721 5\' 6"  (1.676 m)      Head Circumference --      Peak Flow --      Pain Score --      Pain Loc --      Pain Edu? --      Excl. in [place]? --     Constitutional: Alert and oriented. Well appearing and in no acute distress. Eyes: Conjunctivae are normal. PERRL. EOMI. Head: Atraumatic. Nose: No congestion/rhinnorhea. Mouth/Throat: Mucous membranes are moist.   Neck: No stridor.   Cardiovascular: Normal rate, regular rhythm. Grossly normal heart sounds.   Respiratory: Normal respiratory effort.  No retractions. Lungs CTAB. Gastrointestinal: Soft and nontender. No distention.  Musculoskeletal: No lower extremity tenderness nor edema.  No joint effusions. Neurologic:  Normal speech and language. No gross focal neurologic deficits are appreciated. No gait instability. Skin:  Skin is  warm, dry and intact. No rash noted. Psychiatric: Mood and affect are normal. Speech and behavior are normal.  ____________________________________________   LABS (all labs ordered are listed, but only abnormal results are displayed)  Labs Reviewed  BASIC METABOLIC PANEL - Abnormal; Notable for the following:       Result Value   Glucose, Bld 127 (*)    Creatinine, Ser 1.07 (*)    GFR calc non Af Amer 52 (*)    All other components within normal limits  URINALYSIS COMPLETEWITH MICROSCOPIC ([facility] ONLY) - Abnormal; Notable for the following:    Color, Urine STRAW (*)    APPearance CLEAR (*)    Hgb urine dipstick 1+ (*)    Squamous Epithelial / LPF 0-5 (*)    All other components within normal limits  GLUCOSE, CAPILLARY - Abnormal; Notable for the following:    Glucose-Capillary 114 (*)    All other components within normal limits  CBC  TROPONIN I  CBG MONITORING, ED   ____________________________________________  EKG  ED ECG REPORT I, Doran Stabler, the attending physician, personally viewed and interpreted this ECG.   Date: 09/17/2016  EKG Time: 1724  Rate: 75  Rhythm: normal sinus rhythm  Axis: normal  axis  Intervals:none  ST&T Change: no ST segment elevation or depression. No abnormal T-wave inversion.  ____________________________________________  RADIOLOGY   ____________________________________________   PROCEDURES  Procedure(s) performed:   Procedures  Critical Care performed:   ____________________________________________   INITIAL IMPRESSION / ASSESSMENT AND PLAN / ED COURSE  Pertinent labs & imaging results that were available during my care of the patient were reviewed by me and considered in my medical decision making (see chart for details).  ----------------------------------------- 8:36 PM on 09/17/2016 -----------------------------------------  Patient without anxiety after Valium. Possible withdrawal symptoms from medication. Will be following up with her primary care doctor. We'll discharge to home. Counseled at length about weaning herself from her Xanax as well. She is understanding of this plan and willing to comply.  Clinical Course     ____________________________________________   FINAL CLINICAL IMPRESSION(S) / ED DIAGNOSES  Anxiety. Medical screening exam.    NEW MEDICATIONS STARTED DURING THIS VISIT:  New Prescriptions   No medications on file     Note:  This document was prepared using Dragon voice recognition software and may include unintentional dictation errors.    Orbie Pyo, MD 09/17/16 2037

## 2016-09-17 NOTE — ED Notes (Signed)
Patient states, "I know it's anxiety. I take xanax as needed. I can take up to 4 a day but I try to only take one".

## 2016-09-20 ENCOUNTER — Encounter: Payer: Self-pay | Admitting: Family Medicine

## 2016-09-20 ENCOUNTER — Ambulatory Visit (INDEPENDENT_AMBULATORY_CARE_PROVIDER_SITE_OTHER): Payer: PPO | Admitting: Family Medicine

## 2016-09-20 VITALS — BP 118/80 | HR 92 | Temp 98.1°F | Resp 16 | Ht 66.0 in | Wt 152.1 lb

## 2016-09-20 DIAGNOSIS — R3129 Other microscopic hematuria: Secondary | ICD-10-CM

## 2016-09-20 DIAGNOSIS — E038 Other specified hypothyroidism: Secondary | ICD-10-CM

## 2016-09-20 DIAGNOSIS — E039 Hypothyroidism, unspecified: Secondary | ICD-10-CM

## 2016-09-20 MED ORDER — LEVOTHYROXINE SODIUM 88 MCG PO TABS: 44.0000 ug | ORAL_TABLET | Freq: Every day | ORAL | 1 refills | Status: DC

## 2016-09-20 MED ORDER — CITALOPRAM HYDROBROMIDE 10 MG PO TABS: 10.0000 mg | ORAL_TABLET | Freq: Every day | ORAL | 0 refills | Status: DC

## 2016-09-20 NOTE — Progress Notes (Signed)
Name: Dawn Fuentes   MRN: [id number]    DOB: 09-Mar-1946   Date:09/20/2016       Progress Note  Subjective  Chief Complaint  Chief Complaint  Patient presents with  . Hospitalization Follow-up    for anxiety attack    HPI  GAD: she states that since last week she has been feeling different since she started Methimazole about one month ago, however symptoms worse over the past week ( methimazole was stopped because it was not helping with the eye pressure). She has noticed a jittery feeling, lack of appetite, feeling very nervous. She was told to decrease xanax on her last visit with me, followed by not responding to methimazole and will need eye surgery and last [birth year] was [birth year].   Hypothyroidism: TSH was suppressed and we adjusted dose up instead of down, but only made change over the past week, unlikely the cause of her increase anxiety   Patient Active Problem List   Diagnosis Date Noted  . Chronic kidney disease (CKD), stage III (moderate) 04/06/2016  . Cystitis 10/12/2015  . Anxiety state 08/21/2015  . Benign hypertension 08/21/2015  . Insomnia, persistent 08/21/2015  . Dyslipidemia 08/21/2015  . Gastro-esophageal reflux disease without esophagitis 08/21/2015  . Glaucoma 08/21/2015  . Blood glucose elevated 08/21/2015  . Adult hypothyroidism 08/21/2015  . Climacteric 08/21/2015  . Senile osteoporosis 08/21/2015  . Seasonal allergies 08/21/2015  . Female genuine stress incontinence 08/21/2015  . Vitamin D deficiency 08/21/2015    Past Surgical History:  Procedure Laterality Date  . ABDOMINAL HYSTERECTOMY    . APPENDECTOMY    . COLONOSCOPY WITH PROPOFOL N/A 05/29/2015   Procedure: COLONOSCOPY WITH PROPOFOL;  Surgeon: Hulen Luster, MD;  Location: [facility] ENDOSCOPY;  Service: Gastroenterology;  Laterality: N/A;  . ESOPHAGOGASTRODUODENOSCOPY N/A 05/29/2015   Procedure: ESOPHAGOGASTRODUODENOSCOPY (EGD);  Surgeon: Hulen Luster, MD;  Location: [facility] ENDOSCOPY;  Service:  Gastroenterology;  Laterality: N/A;  . EYE SURGERY    . OVARIAN CYST REMOVAL      Family History  Problem Relation Age of Onset  . Hypertension Mother   . Osteoporosis Mother   . COPD Mother   . Dementia Mother   . Cancer Father     Colon    Social History   Social History  . Marital status: Married    Spouse name: N/A  . Number of children: N/A  . Years of education: N/A   Occupational History  . Not on file.   Social History Main Topics  . Smoking status: Never Smoker  . Smokeless tobacco: Never Used  . Alcohol use No  . Drug use: No  . Sexual activity: Not Currently   Other Topics Concern  . Not on file   Social History Narrative  . No narrative on file     Current Outpatient Prescriptions:  .  ALPRAZolam (XANAX) 0.5 MG tablet, Take 1 tablet (0.5 mg total) by mouth daily as needed for anxiety. Must last 90 days, Disp: 45 tablet, Rfl: 0 .  aspirin 81 MG tablet, Take 81 mg by mouth daily., Disp: , Rfl:  .  cholecalciferol (VITAMIN D) 1000 units tablet, Take 1,000 Units by mouth daily., Disp: , Rfl:  .  cloNIDine (CATAPRES) 0.1 MG tablet, Take 1 tablet (0.1 mg total) by mouth daily., Disp: 90 tablet, Rfl: 3 .  fluticasone (FLONASE) 50 MCG/ACT nasal spray, Place 2 sprays into both nostrils daily., Disp: 16 g, Rfl: 6 .  levothyroxine (SYNTHROID, LEVOTHROID) 88 MCG  tablet, Take 0.5-1 tablets (44-88 mcg total) by mouth daily before breakfast. Monday and Wednesday , Friday 1 tablet. Tues, thurs,  sat, and sun take 1/2 tablet, Disp: 90 tablet, Rfl: 1 .  loratadine (CLARITIN) 10 MG tablet, Take 10 mg by mouth daily as needed for allergies., Disp: , Rfl:  .  losartan (COZAAR) 100 MG tablet, Take 1 tablet (100 mg total) by mouth daily., Disp: 90 tablet, Rfl: 3 .  methazolamide (NEPTAZANE) 50 MG tablet, TK 1 T PO BID, Disp: , Rfl: 2 .  montelukast (SINGULAIR) 10 MG tablet, Take 1 tablet (10 mg total) by mouth daily as needed (allergies)., Disp: 90 tablet, Rfl: 3 .   pantoprazole (PROTONIX) 40 MG tablet, Take 1 tablet (40 mg total) by mouth daily., Disp: 90 tablet, Rfl: 3 .  PAZEO 0.7 % SOLN, INSTILL 1 GTT IN OU QD, Disp: , Rfl: 6 .  polyethylene glycol powder (GLYCOLAX/MIRALAX) powder, , Disp: , Rfl:  .  simvastatin (ZOCOR) 40 MG tablet, Take 1 tablet (40 mg total) by mouth daily., Disp: 90 tablet, Rfl: 1 .  timolol (BETIMOL) 0.25 % ophthalmic solution, 1-2 drops 2 (two) times daily., Disp: , Rfl:  .  zolpidem (AMBIEN) 10 MG tablet, TAKE 1 TABLET BY MOUTH EVERY DAY AT BEDTIME, Disp: 90 tablet, Rfl: 0  Allergies  Allergen Reactions  . Raloxifene     bone pain  . Sulfa Antibiotics Nausea Only    Intolerance rather than allergy.     ROS  Ten systems reviewed and is negative except as mentioned in HPI   Objective  Vitals:   09/20/16 1516  BP: 118/80  Pulse: 92  Resp: 16  Temp: 98.1 F (36.7 C)  SpO2: 97%  Weight: 152 lb 1 oz (69 kg)  Height: _0  (1.676 m)    Body mass index is 24.54 kg/m.  Physical Exam  Constitutional: Patient appears well-developed and well-nourished. No distress.  HEENT: head atraumatic, normocephalic, pupils equal and reactive to light, ears, neck supple, throat within normal limits Cardiovascular: Normal rate, regular rhythm and normal heart sounds.  No murmur heard. No BLE edema. Pulmonary/Chest: Effort normal and breath sounds normal. No respiratory distress. Abdominal: Soft.  There is no tenderness. Psychiatric: Patient is anxious. behavior is normal. Judgment and thought content normal.  Recent Results (from the past 2160 hour(s))  POCT glycosylated hemoglobin (Hb A1C)     Status: Normal   Collection Time: 09/07/16 11:47 AM  Result Value Ref Range   Hemoglobin A1C 5.9   COMPLETE METABOLIC PANEL WITH GFR     Status: Abnormal   Collection Time: 09/07/16 11:55 AM  Result Value Ref Range   Sodium 141 135 - 146 mmol/L   Potassium 4.0 3.5 - 5.3 mmol/L   Chloride 108 98 - 110 mmol/L   CO2 22 20 - 31  mmol/L   Glucose, Bld 102 (H) 65 - 99 mg/dL   BUN 18 7 - 25 mg/dL   Creat 1.11 (H) 0.50 - 0.99 mg/dL    Comment:   For patients > or = 68 years of age: The upper reference limit for Creatinine is approximately 13% higher for people identified as African-American.      Total Bilirubin 0.7 0.2 - 1.2 mg/dL   Alkaline Phosphatase 59 33 - 130 U/L   AST 32 10 - 35 U/L   ALT 16 6 - 29 U/L   Total Protein 7.4 6.1 - 8.1 g/dL   Albumin 4.5 3.6 - 5.1 g/dL  Calcium 9.8 8.6 - 10.4 mg/dL   GFR, Est African American 59 (L) >=60 mL/min   GFR, Est Non African American 52 (L) >=60 mL/min  TSH     Status: Abnormal   Collection Time: 09/07/16 11:55 AM  Result Value Ref Range   TSH 0.20 (L) mIU/L    Comment:   Reference Range   > or = 20 Years  0.40-4.50   Pregnancy Range First trimester  0.26-2.66 Second trimester 0.55-2.73 Third trimester  0.43-2.91     Vitamin [name]     Status: None   Collection Time: 09/07/16 11:55 AM  Result Value Ref Range   Vitamin B-12 270 200 - 1,100 pg/mL  CBC with Differential/Platelet     Status: None   Collection Time: 09/07/16 11:55 AM  Result Value Ref Range   WBC 7.6 3.8 - 10.8 K/uL   RBC 4.71 3.80 - 5.10 MIL/uL   Hemoglobin 14.3 11.7 - 15.5 g/dL   HCT 43.4 35.0 - 45.0 %   MCV 92.1 80.0 - 100.0 fL   MCH 30.4 27.0 - 33.0 pg   MCHC 32.9 32.0 - 36.0 g/dL   RDW 13.2 11.0 - 15.0 %   Platelets 300 140 - 400 K/uL   MPV 11.0 7.5 - 12.5 fL   Neutro Abs 5,092 1,500 - 7,800 cells/uL   Lymphs Abs 1,368 850 - 3,900 cells/uL   Monocytes Absolute 760 200 - 950 cells/uL   Eosinophils Absolute 304 15 - 500 cells/uL   Basophils Absolute 76 0 - 200 cells/uL   Neutrophils Relative % 67 %   Lymphocytes Relative 18 %   Monocytes Relative 10 %   Eosinophils Relative 4 %   Basophils Relative 1 %   Smear Review Criteria for review not met   Glucose, capillary     Status: Abnormal   Collection Time: 09/17/16  5:29 PM  Result Value Ref Range   Glucose-Capillary 114  (H) 65 - 99 mg/dL  Basic metabolic panel     Status: Abnormal   Collection Time: 09/17/16  5:30 PM  Result Value Ref Range   Sodium 139 135 - 145 mmol/L   Potassium 3.7 3.5 - 5.1 mmol/L   Chloride 107 101 - 111 mmol/L   CO2 25 22 - 32 mmol/L   Glucose, Bld 127 (H) 65 - 99 mg/dL   BUN 15 6 - 20 mg/dL   Creatinine, Ser 1.07 (H) 0.44 - 1.00 mg/dL   Calcium 9.7 8.9 - 10.3 mg/dL   GFR calc non Af Amer 52 (L) >60 mL/min   GFR calc Af Amer >60 >60 mL/min    Comment: (NOTE) The eGFR has been calculated using the CKD EPI equation. This calculation has not been validated in all clinical situations. eGFR's persistently <60 mL/min signify possible Chronic Kidney Disease.    Anion gap 7 5 - 15  CBC     Status: None   Collection Time: 09/17/16  5:30 PM  Result Value Ref Range   WBC 6.5 3.6 - 11.0 K/uL   RBC 4.57 3.80 - 5.20 MIL/uL   Hemoglobin 14.2 12.0 - 16.0 g/dL   HCT 40.9 35.0 - 47.0 %   MCV 89.5 80.0 - 100.0 fL   MCH 31.1 26.0 - 34.0 pg   MCHC 34.8 32.0 - 36.0 g/dL   RDW 13.1 11.5 - 14.5 %   Platelets 255 150 - 440 K/uL  Urinalysis complete, with microscopic     Status: Abnormal   Collection Time:  09/17/16  5:30 PM  Result Value Ref Range   Color, Urine STRAW (A) YELLOW   APPearance CLEAR (A) CLEAR   Glucose, UA NEGATIVE NEGATIVE mg/dL   Bilirubin Urine NEGATIVE NEGATIVE   Ketones, ur NEGATIVE NEGATIVE mg/dL   Specific Gravity, Urine 1.005 1.005 - 1.030   Hgb urine dipstick 1+ (A) NEGATIVE   pH 5.0 5.0 - 8.0   Protein, ur NEGATIVE NEGATIVE mg/dL   Nitrite NEGATIVE NEGATIVE   Leukocytes, UA NEGATIVE NEGATIVE   RBC / HPF 0-5 0 - 5 RBC/hpf   WBC, UA 0-5 0 - 5 WBC/hpf   Bacteria, UA NONE SEEN NONE SEEN   Squamous Epithelial / LPF 0-5 (A) NONE SEEN   Mucous PRESENT   Troponin I     Status: None   Collection Time: 09/17/16  5:30 PM  Result Value Ref Range   Troponin I <0.03 <0.03 ng/mL     PHQ2/9: Depression screen [facility] 2/9 09/20/2016 09/07/2016 03/07/2016 08/25/2015   Decreased Interest 0 0 0 0  Down, Depressed, Hopeless 0 0 0 0  PHQ - 2 Score 0 0 0 0     Fall Risk: Fall Risk  09/20/2016 09/07/2016 03/07/2016 08/25/2015  Falls in the past year? No No No No     Functional Status Survey: Is the patient deaf or have difficulty hearing?: No Does the patient have difficulty seeing, even when wearing glasses/contacts?: No Does the patient have difficulty concentrating, remembering, or making decisions?: No Does the patient have difficulty walking or climbing stairs?: No Does the patient have difficulty dressing or bathing?: No Does the patient have difficulty doing errands alone such as visiting a doctor's office or shopping?: No    Assessment & Plan  1. Adult hypothyroidism  Adjusted dose of levothyroxine  2. Other specified hypothyroidism  - levothyroxine (SYNTHROID, LEVOTHROID) 88 MCG tablet; Take 0.5-1 tablets (44-88 mcg total) by mouth daily before breakfast. Monday and Wednesday 1 tablets  Tues, thurs, Friday sat, and sun take 1/2 tablet  Dispense: 90 tablet; Refill: 1 - citalopram (CELEXA) 10 MG tablet; Take 1-2 tablets (10-20 mg total) by mouth daily.  Dispense: 60 tablet; Refill: 0  3. Other microscopic hematuria  - Urine Culture

## 2016-09-21 DIAGNOSIS — R3129 Other microscopic hematuria: Secondary | ICD-10-CM | POA: Diagnosis not present

## 2016-09-23 ENCOUNTER — Encounter (INDEPENDENT_AMBULATORY_CARE_PROVIDER_SITE_OTHER): Payer: Self-pay

## 2016-09-23 DIAGNOSIS — R300 Dysuria: Secondary | ICD-10-CM | POA: Diagnosis not present

## 2016-09-23 DIAGNOSIS — R350 Frequency of micturition: Secondary | ICD-10-CM | POA: Diagnosis not present

## 2016-09-27 LAB — URINE CULTURE

## 2016-09-29 ENCOUNTER — Other Ambulatory Visit: Payer: Self-pay | Admitting: Family Medicine

## 2016-09-29 MED ORDER — CIPROFLOXACIN HCL 250 MG PO TABS: 250.0000 mg | ORAL_TABLET | Freq: Two times a day (BID) | ORAL | 0 refills | Status: DC

## 2016-10-04 ENCOUNTER — Encounter: Payer: Self-pay | Admitting: Family Medicine

## 2016-10-04 ENCOUNTER — Ambulatory Visit (INDEPENDENT_AMBULATORY_CARE_PROVIDER_SITE_OTHER): Payer: PPO | Admitting: Family Medicine

## 2016-10-04 VITALS — BP 122/80 | HR 84 | Temp 97.7°F | Wt 152.9 lb

## 2016-10-04 DIAGNOSIS — F411 Generalized anxiety disorder: Secondary | ICD-10-CM | POA: Diagnosis not present

## 2016-10-04 DIAGNOSIS — E039 Hypothyroidism, unspecified: Secondary | ICD-10-CM

## 2016-10-04 DIAGNOSIS — N390 Urinary tract infection, site not specified: Secondary | ICD-10-CM

## 2016-10-04 DIAGNOSIS — I10 Essential (primary) hypertension: Secondary | ICD-10-CM

## 2016-10-04 DIAGNOSIS — R319 Hematuria, unspecified: Secondary | ICD-10-CM | POA: Diagnosis not present

## 2016-10-04 LAB — POCT URINALYSIS DIPSTICK
Bilirubin, UA: NEGATIVE
Glucose, UA: NEGATIVE
Ketones, UA: NEGATIVE
Leukocytes, UA: NEGATIVE
Nitrite, UA: NEGATIVE
Protein, UA: NEGATIVE
Spec Grav, UA: 1.015
Urobilinogen, UA: NEGATIVE
pH, UA: 6

## 2016-10-04 MED ORDER — METOPROLOL SUCCINATE ER 25 MG PO TB24: 25.0000 mg | ORAL_TABLET | Freq: Every day | ORAL | 0 refills | Status: DC

## 2016-10-04 NOTE — Progress Notes (Signed)
Name: Dawn Fuentes   MRN: [id number]    DOB: [birth year]   Date:10/04/2016       Progress Note  Subjective  Chief Complaint  Chief Complaint  Patient presents with  . Follow-up  . Urinary Tract Infection    Patient did another home urinary test and it came back for another UTI. Patient has already been to Urgent Care and given antibiotic and has completed it.     HPI  Cystitis: she was treated with Cipro for UTI on 09/21/2016, took 7 days of medication, symptoms of frequency, dysuria resolved, however did a home urine test that was abnormal this morning, so she decided to come in. Our urine dipstick only showed mild hematuria  Hypothyroidism: she states that she is taking Levothyroxine at the correct dose, still feeling more anxious than usual, recheck labs in 2 weeks  HTN: she is taking Losartan but we will add Toprol XL to help with anxiety. Discussed to monitor bp and if she feels dizzy we will decrease dose of Losartan. No chest pain or palpitation  GAD: taking Citalopram since last visit, she has noticed that she has been more calm, she is still on just 10 mg daily and will try to go up to 36m daily, still feels very anxious and has been taking Alprazolam daily, except for yesterday. She has noticed a nervous stomach, gets quizzines  and has diarrhea when upset, it was worse when she was waiting for her sister to have a cardiac cath    Patient Active Problem List   Diagnosis Date Noted  . Chronic kidney disease (CKD), stage III (moderate) 04/06/2016  . Cystitis 10/12/2015  . Anxiety state 08/21/2015  . Benign hypertension 08/21/2015  . Insomnia, persistent 08/21/2015  . Dyslipidemia 08/21/2015  . Gastro-esophageal reflux disease without esophagitis 08/21/2015  . Glaucoma 08/21/2015  . Blood glucose elevated 08/21/2015  . Adult hypothyroidism 08/21/2015  . Climacteric 08/21/2015  . Senile osteoporosis 08/21/2015  . Seasonal allergies 08/21/2015  . Female genuine stress  incontinence 08/21/2015  . Vitamin D deficiency 08/21/2015    Past Surgical History:  Procedure Laterality Date  . ABDOMINAL HYSTERECTOMY    . APPENDECTOMY    . COLONOSCOPY WITH PROPOFOL N/A 05/29/2015   Procedure: COLONOSCOPY WITH PROPOFOL;  Surgeon: PHulen Luster MD;  [facility]  Service: Gastroenterology;  Laterality: N/A;  . ESOPHAGOGASTRODUODENOSCOPY N/A 05/29/2015   Procedure: ESOPHAGOGASTRODUODENOSCOPY (EGD);  Surgeon: PHulen Luster MD;  [facility];  Service: Gastroenterology;  Laterality: N/A;  . EYE SURGERY    . OVARIAN CYST REMOVAL      Family History  Problem Relation Age of Onset  . Hypertension Mother   . Osteoporosis Mother   . COPD Mother   . Dementia Mother   . Cancer Father     Colon    Social History   Social History  . Marital status: Married    Spouse name: N/A  . Number of children: N/A  . Years of education: N/A   Occupational History  . Not on file.   Social History Main Topics  . Smoking status: Never Smoker  . Smokeless tobacco: Never Used  . Alcohol use No  . Drug use: No  . Sexual activity: Not Currently   Other Topics Concern  . Not on file   Social History Narrative  . No narrative on file     Current Outpatient Prescriptions:  .  ALPRAZolam (XANAX) 0.5 MG tablet, Take 1 tablet (0.5 mg  total) by mouth daily as needed for anxiety. Must last 90 days, Disp: 45 tablet, Rfl: 0 .  aspirin 81 MG tablet, Take 81 mg by mouth daily., Disp: , Rfl:  .  cholecalciferol (VITAMIN D) 1000 units tablet, Take 1,000 Units by mouth daily., Disp: , Rfl:  .  citalopram (CELEXA) 10 MG tablet, Take 1-2 tablets (10-20 mg total) by mouth daily., Disp: 60 tablet, Rfl: 0 .  cloNIDine (CATAPRES) 0.1 MG tablet, Take 1 tablet (0.1 mg total) by mouth daily., Disp: 90 tablet, Rfl: 3 .  losartan (COZAAR) 100 MG tablet, Take 1 tablet (100 mg total) by mouth daily., Disp: 90 tablet, Rfl: 3 .  pantoprazole (PROTONIX) 40 MG tablet, Take 1 tablet  (40 mg total) by mouth daily., Disp: 90 tablet, Rfl: 3 .  PAZEO 0.7 % SOLN, INSTILL 1 GTT IN OU QD, Disp: , Rfl: 6 .  simvastatin (ZOCOR) 40 MG tablet, Take 1 tablet (40 mg total) by mouth daily., Disp: 90 tablet, Rfl: 1 .  timolol (BETIMOL) 0.25 % ophthalmic solution, 1-2 drops 2 (two) times daily., Disp: , Rfl:  .  zolpidem (AMBIEN) 10 MG tablet, TAKE 1 TABLET BY MOUTH EVERY DAY AT BEDTIME, Disp: 90 tablet, Rfl: 0 .  ciprofloxacin (CIPRO) 250 MG tablet, Take 1 tablet (250 mg total) by mouth 2 (two) times daily. (Patient not taking: Reported on 10/04/2016), Disp: 6 tablet, Rfl: 0 .  fluticasone (FLONASE) 50 MCG/ACT nasal spray, Place 2 sprays into both nostrils daily., Disp: 16 g, Rfl: 6 .  levothyroxine (SYNTHROID, LEVOTHROID) 88 MCG tablet, Take 0.5-1 tablets (44-88 mcg total) by mouth daily before breakfast. Monday and Wednesday 1 tablets  Tues, thurs, Friday sat, and sun take 1/2 tablet (Patient not taking: Reported on 10/04/2016), Disp: 90 tablet, Rfl: 1 .  loratadine (CLARITIN) 10 MG tablet, Take 10 mg by mouth daily as needed for allergies., Disp: , Rfl:  .  methazolamide (NEPTAZANE) 50 MG tablet, TK 1 T PO BID, Disp: , Rfl: 2 .  montelukast (SINGULAIR) 10 MG tablet, Take 1 tablet (10 mg total) by mouth daily as needed (allergies). (Patient not taking: Reported on 10/04/2016), Disp: 90 tablet, Rfl: 3 .  polyethylene glycol powder (GLYCOLAX/MIRALAX) powder, , Disp: , Rfl:   Allergies  Allergen Reactions  . Raloxifene     bone pain  . Sulfa Antibiotics Nausea Only    Intolerance rather than allergy.     ROS  Ten systems reviewed and is negative except as mentioned in HPI   Objective  Vitals:   10/04/16 0946  BP: 122/80  Pulse: 84  Temp: 97.7 F (36.5 C)  SpO2: 97%  Weight: 152 lb 14.4 oz (69.4 kg)    Body mass index is 24.68 kg/m.  Physical Exam  Constitutional: Patient appears well-developed and well-nourished. Obese  No distress.  HEENT: head atraumatic,  normocephalic, pupils equal and reactive to light, neck supple, throat within normal limits Cardiovascular: Normal rate, regular rhythm and normal heart sounds.  No murmur heard. No BLE edema. Pulmonary/Chest: Effort normal and breath sounds normal. No respiratory distress. Abdominal: Soft.  There is no tenderness. Psychiatric: Patient has a normal mood and affect. behavior is normal. Judgment and thought content normal.  Recent Results (from the past 2160 hour(s))  POCT glycosylated hemoglobin (Hb A1C)     Status: Normal   Collection Time: 09/07/16 11:47 AM  Result Value Ref Range   Hemoglobin A1C 5.9   COMPLETE METABOLIC PANEL WITH GFR  Status: Abnormal   Collection Time: 09/07/16 11:55 AM  Result Value Ref Range   Sodium 141 135 - 146 mmol/L   Potassium 4.0 3.5 - 5.3 mmol/L   Chloride 108 98 - 110 mmol/L   CO2 22 20 - 31 mmol/L   Glucose, Bld 102 (H) 65 - 99 mg/dL   BUN 18 7 - 25 mg/dL   Creat 1.11 (H) 0.50 - 0.99 mg/dL    Comment:   For patients > or = 68 years of age: The upper reference limit for Creatinine is approximately 13% higher for people identified as African-American.      Total Bilirubin 0.7 0.2 - 1.2 mg/dL   Alkaline Phosphatase 59 33 - 130 U/L   AST 32 10 - 35 U/L   ALT 16 6 - 29 U/L   Total Protein 7.4 6.1 - 8.1 g/dL   Albumin 4.5 3.6 - 5.1 g/dL   Calcium 9.8 8.6 - 10.4 mg/dL   GFR, Est African American 59 (L) >=60 mL/min   GFR, Est Non African American 52 (L) >=60 mL/min  TSH     Status: Abnormal   Collection Time: 09/07/16 11:55 AM  Result Value Ref Range   TSH 0.20 (L) mIU/L    Comment:   Reference Range   > or = 20 Years  0.40-4.50   Pregnancy Range First trimester  0.26-2.66 Second trimester 0.55-2.73 Third trimester  0.43-2.91     Vitamin [name]     Status: None   Collection Time: 09/07/16 11:55 AM  Result Value Ref Range   Vitamin B-12 270 200 - 1,100 pg/mL  CBC with Differential/Platelet     Status: None   Collection Time: 09/07/16  11:55 AM  Result Value Ref Range   WBC 7.6 3.8 - 10.8 K/uL   RBC 4.71 3.80 - 5.10 MIL/uL   Hemoglobin 14.3 11.7 - 15.5 g/dL   HCT 43.4 35.0 - 45.0 %   MCV 92.1 80.0 - 100.0 fL   MCH 30.4 27.0 - 33.0 pg   MCHC 32.9 32.0 - 36.0 g/dL   RDW 13.2 11.0 - 15.0 %   Platelets 300 140 - 400 K/uL   MPV 11.0 7.5 - 12.5 fL   Neutro Abs 5,092 1,500 - 7,800 cells/uL   Lymphs Abs 1,368 850 - 3,900 cells/uL   Monocytes Absolute 760 200 - 950 cells/uL   Eosinophils Absolute 304 15 - 500 cells/uL   Basophils Absolute 76 0 - 200 cells/uL   Neutrophils Relative % 67 %   Lymphocytes Relative 18 %   Monocytes Relative 10 %   Eosinophils Relative 4 %   Basophils Relative 1 %   Smear Review Criteria for review not met   Glucose, capillary     Status: Abnormal   Collection Time: 09/17/16  5:29 PM  Result Value Ref Range   Glucose-Capillary 114 (H) 65 - 99 mg/dL  Basic metabolic panel     Status: Abnormal   Collection Time: 09/17/16  5:30 PM  Result Value Ref Range   Sodium 139 135 - 145 mmol/L   Potassium 3.7 3.5 - 5.1 mmol/L   Chloride 107 101 - 111 mmol/L   CO2 25 22 - 32 mmol/L   Glucose, Bld 127 (H) 65 - 99 mg/dL   BUN 15 6 - 20 mg/dL   Creatinine, Ser 1.07 (H) 0.44 - 1.00 mg/dL   Calcium 9.7 8.9 - 10.3 mg/dL   GFR calc non Af Amer 52 (L) >60 mL/min  GFR calc Af Amer >60 >60 mL/min    Comment: (NOTE) The eGFR has been calculated using the CKD EPI equation. This calculation has not been validated in all clinical situations. eGFR's persistently <60 mL/min signify possible Chronic Kidney Disease.    Anion gap 7 5 - 15  CBC     Status: None   Collection Time: 09/17/16  5:30 PM  Result Value Ref Range   WBC 6.5 3.6 - 11.0 K/uL   RBC 4.57 3.80 - 5.20 MIL/uL   Hemoglobin 14.2 12.0 - 16.0 g/dL   HCT 40.9 35.0 - 47.0 %   MCV 89.5 80.0 - 100.0 fL   MCH 31.1 26.0 - 34.0 pg   MCHC 34.8 32.0 - 36.0 g/dL   RDW 13.1 11.5 - 14.5 %   Platelets 255 150 - 440 K/uL  Urinalysis complete, with  microscopic     Status: Abnormal   Collection Time: 09/17/16  5:30 PM  Result Value Ref Range   Color, Urine STRAW (A) YELLOW   APPearance CLEAR (A) CLEAR   Glucose, UA NEGATIVE NEGATIVE mg/dL   Bilirubin Urine NEGATIVE NEGATIVE   Ketones, ur NEGATIVE NEGATIVE mg/dL   Specific Gravity, Urine 1.005 1.005 - 1.030   Hgb urine dipstick 1+ (A) NEGATIVE   pH 5.0 5.0 - 8.0   Protein, ur NEGATIVE NEGATIVE mg/dL   Nitrite NEGATIVE NEGATIVE   Leukocytes, UA NEGATIVE NEGATIVE   RBC / HPF 0-5 0 - 5 RBC/hpf   WBC, UA 0-5 0 - 5 WBC/hpf   Bacteria, UA NONE SEEN NONE SEEN   Squamous Epithelial / LPF 0-5 (A) NONE SEEN   Mucous PRESENT   Troponin I     Status: None   Collection Time: 09/17/16  5:30 PM  Result Value Ref Range   Troponin I <0.03 <0.03 ng/mL  Urine Culture     Status: Abnormal   Collection Time: 09/21/16 12:00 AM  Result Value Ref Range   Urine Culture, Routine Final report (A)    Urine Culture result 1 Klebsiella pneumoniae (A)     Comment: Greater than 100,000 colony forming units per mL Cefazolin <=4 ug/mL Cefazolin with an MIC <=16 predicts susceptibility to the oral agents cefaclor, cefdinir, cefpodoxime, cefprozil, cefuroxime, cephalexin, and loracarbef when used for therapy of uncomplicated urinary tract infections due to E. coli, Klebsiella pneumoniae, and Proteus mirabilis.    ANTIMICROBIAL SUSCEPTIBILITY Comment     Comment:       ** S = Susceptible; I = Intermediate; R = Resistant **                    P = Positive; N = Negative             MICS are expressed in micrograms per mL    Antibiotic                 RSLT#1    RSLT#2    RSLT#3    RSLT#4 Amoxicillin/Clavulanic Acid    S Ampicillin                     R Cefepime                       S Ceftriaxone                    S Cefuroxime  S Cephalothin                    S Ciprofloxacin                  S Ertapenem                      S Gentamicin                     S Imipenem                        S Levofloxacin                   S Nitrofurantoin                 I Piperacillin                   S Tetracycline                   S Tobramycin                     S Trimethoprim/Sulfa             S   POCT urinalysis dipstick     Status: Abnormal   Collection Time: 10/04/16 10:23 AM  Result Value Ref Range   Color, UA yellow    Clarity, UA clear    Glucose, UA neg    Bilirubin, UA neg    Ketones, UA neg    Spec Grav, UA 1.015    Blood, UA small    pH, UA 6.0    Protein, UA neg    Urobilinogen, UA negative    Nitrite, UA neg    Leukocytes, UA Negative Negative      PHQ2/9: Depression screen [facility] 2/9 10/04/2016 09/20/2016 09/07/2016 03/07/2016 08/25/2015  Decreased Interest 0 0 0 0 0  Down, Depressed, Hopeless 0 0 0 0 0  PHQ - 2 Score 0 0 0 0 0     Fall Risk: Fall Risk  10/04/2016 09/20/2016 09/07/2016 03/07/2016 08/25/2015  Falls in the past year? No No No No No    Functional Status Survey: Is the patient deaf or have difficulty hearing?: No Does the patient have difficulty seeing, even when wearing glasses/contacts?: Yes (glasses) Does the patient have difficulty concentrating, remembering, or making decisions?: No Does the patient have difficulty walking or climbing stairs?: No Does the patient have difficulty dressing or bathing?: No Does the patient have difficulty doing errands alone such as visiting a doctor's office or shopping?: No   Assessment & Plan  1. Urinary tract infection with hematuria, site unspecified  - POCT urinalysis dipstick - Urine Culture  2. Adult hypothyroidism  - TSH  3. GAD (generalized anxiety disorder)  Increase dose of Citalopram, try not to take Alprazolam daily   4. Benign hypertension  - metoprolol succinate (TOPROL-XL) 25 MG 24 hr tablet; Take 1 tablet (25 mg total) by mouth at bedtime.  Dispense: 30 tablet; Refill: 0

## 2016-10-05 LAB — URINE CULTURE

## 2016-10-13 ENCOUNTER — Ambulatory Visit (INDEPENDENT_AMBULATORY_CARE_PROVIDER_SITE_OTHER): Payer: PPO

## 2016-10-13 DIAGNOSIS — E538 Deficiency of other specified B group vitamins: Secondary | ICD-10-CM

## 2016-10-13 MED ORDER — CYANOCOBALAMIN 1000 MCG/ML IJ SOLN
1000.0000 ug | Freq: Once | INTRAMUSCULAR | Status: AC
  Administered 2016-10-13: 1000 ug via INTRAMUSCULAR

## 2016-10-17 DIAGNOSIS — E039 Hypothyroidism, unspecified: Secondary | ICD-10-CM | POA: Diagnosis not present

## 2016-10-17 LAB — TSH: TSH: 8.87 m[IU]/L — ABNORMAL HIGH

## 2016-10-20 ENCOUNTER — Other Ambulatory Visit: Payer: Self-pay | Admitting: Family Medicine

## 2016-10-20 DIAGNOSIS — E038 Other specified hypothyroidism: Secondary | ICD-10-CM

## 2016-10-21 NOTE — Telephone Encounter (Signed)
Patient requesting refill of Levothyroxine to [facility].

## 2016-10-31 ENCOUNTER — Ambulatory Visit (INDEPENDENT_AMBULATORY_CARE_PROVIDER_SITE_OTHER): Payer: PPO | Admitting: Family Medicine

## 2016-10-31 ENCOUNTER — Encounter: Payer: Self-pay | Admitting: Family Medicine

## 2016-10-31 VITALS — BP 132/78 | HR 84 | Temp 98.1°F | Resp 16 | Ht 66.0 in | Wt 153.0 lb

## 2016-10-31 DIAGNOSIS — F411 Generalized anxiety disorder: Secondary | ICD-10-CM

## 2016-10-31 DIAGNOSIS — I10 Essential (primary) hypertension: Secondary | ICD-10-CM

## 2016-10-31 DIAGNOSIS — E039 Hypothyroidism, unspecified: Secondary | ICD-10-CM

## 2016-10-31 MED ORDER — CITALOPRAM HYDROBROMIDE 10 MG PO TABS: 10.0000 mg | ORAL_TABLET | Freq: Every day | ORAL | 1 refills | Status: DC

## 2016-10-31 NOTE — Progress Notes (Signed)
Name: Dawn Fuentes   MRN: [id number]    DOB: November 29, 1946   Date:10/31/2016       Progress Note  Subjective  Chief Complaint  Chief Complaint  Patient presents with  . Hypertension    never started her clonidine  . Thyroid Problem  . Anxiety    HPI  Hypothyroidism: her TSH has been off lately, discussed branded medication, recheck level in 2 more weeks. She has noticed that right side of neck seems bigger, but no pain.   GAD: she is feeling much better on Citalopram 10 mg, unable to tolerate 20 mg, it made her feel like a " space cadet ", she has noticed decrease in anxiety with Citalopram, thoughts comes and goes, no longer thinks about it all the time, also weaning down on Alprazolam, initially half every other day and was able to skip 4 days in a row. She is feeling more empowered about it.   HTN: bp at home is 112/70's even without added Toprol XL, she is currently only taking half dose of Losartan ( total of 50 mg per day ), denies chest pain or palpitation. She gets dizzy occasionally, but seldom and brief. Discussed going down to 25 mg daily but she would like to hold off for now.   Patient Active Problem List   Diagnosis Date Noted  . Chronic kidney disease (CKD), stage III (moderate) 04/06/2016  . Cystitis 10/12/2015  . Anxiety state 08/21/2015  . Benign hypertension 08/21/2015  . Insomnia, persistent 08/21/2015  . Dyslipidemia 08/21/2015  . Gastro-esophageal reflux disease without esophagitis 08/21/2015  . Glaucoma 08/21/2015  . Blood glucose elevated 08/21/2015  . Adult hypothyroidism 08/21/2015  . Climacteric 08/21/2015  . Senile osteoporosis 08/21/2015  . Seasonal allergies 08/21/2015  . Female genuine stress incontinence 08/21/2015  . Vitamin D deficiency 08/21/2015    Past Surgical History:  Procedure Laterality Date  . ABDOMINAL HYSTERECTOMY    . APPENDECTOMY    . COLONOSCOPY WITH PROPOFOL N/A 05/29/2015   Procedure: COLONOSCOPY WITH PROPOFOL;  Surgeon:  Hulen Luster, MD;  Location: [facility] ENDOSCOPY;  Service: Gastroenterology;  Laterality: N/A;  . ESOPHAGOGASTRODUODENOSCOPY N/A 05/29/2015   Procedure: ESOPHAGOGASTRODUODENOSCOPY (EGD);  Surgeon: Hulen Luster, MD;  Location: [facility] ENDOSCOPY;  Service: Gastroenterology;  Laterality: N/A;  . EYE SURGERY    . OVARIAN CYST REMOVAL      Family History  Problem Relation Age of Onset  . Hypertension Mother   . Osteoporosis Mother   . COPD Mother   . Dementia Mother   . Cancer Father     Colon    Social History   Social History  . Marital status: Married    Spouse name: N/A  . Number of children: N/A  . Years of education: N/A   Occupational History  . Not on file.   Social History Main Topics  . Smoking status: Never Smoker  . Smokeless tobacco: Never Used  . Alcohol use No  . Drug use: No  . Sexual activity: Not Currently   Other Topics Concern  . Not on file   Social History Narrative  . No narrative on file     Current Outpatient Prescriptions:  .  ALPRAZolam (XANAX) 0.5 MG tablet, Take 1 tablet (0.5 mg total) by mouth daily as needed for anxiety. Must last 90 days, Disp: 45 tablet, Rfl: 0 .  aspirin 81 MG tablet, Take 81 mg by mouth daily., Disp: , Rfl:  .  cholecalciferol (VITAMIN D) 1000 units  tablet, Take 1,000 Units by mouth daily., Disp: , Rfl:  .  citalopram (CELEXA) 10 MG tablet, Take 1-2 tablets (10-20 mg total) by mouth daily., Disp: 60 tablet, Rfl: 0 .  fluticasone (FLONASE) 50 MCG/ACT nasal spray, Place 2 sprays into both nostrils daily., Disp: 16 g, Rfl: 6 .  levothyroxine (SYNTHROID, LEVOTHROID) 88 MCG tablet, TAKE 1/2 TABLET BY MOUTH BEFORE BREAKFAST ON TUES, THURS, FRIDAY, SATURDAY, AND SUNDAY. TAKE 1 TABLET ON MONDAY AND WEDNESDAY., Disp: 90 tablet, Rfl: 0 .  loratadine (CLARITIN) 10 MG tablet, Take 10 mg by mouth daily as needed for allergies., Disp: , Rfl:  .  losartan (COZAAR) 100 MG tablet, Take 1 tablet (100 mg total) by mouth daily., Disp: 90 tablet, Rfl:  3 .  methazolamide (NEPTAZANE) 50 MG tablet, TK 1 T PO BID, Disp: , Rfl: 2 .  metoprolol succinate (TOPROL-XL) 25 MG 24 hr tablet, Take 1 tablet (25 mg total) by mouth at bedtime., Disp: 30 tablet, Rfl: 0 .  montelukast (SINGULAIR) 10 MG tablet, Take 1 tablet (10 mg total) by mouth daily as needed (allergies). (Patient not taking: Reported on 10/04/2016), Disp: 90 tablet, Rfl: 3 .  pantoprazole (PROTONIX) 40 MG tablet, Take 1 tablet (40 mg total) by mouth daily., Disp: 90 tablet, Rfl: 3 .  PAZEO 0.7 % SOLN, INSTILL 1 GTT IN OU QD, Disp: , Rfl: 6 .  polyethylene glycol powder (GLYCOLAX/MIRALAX) powder, , Disp: , Rfl:  .  simvastatin (ZOCOR) 40 MG tablet, Take 1 tablet (40 mg total) by mouth daily., Disp: 90 tablet, Rfl: 1 .  timolol (BETIMOL) 0.25 % ophthalmic solution, 1-2 drops 2 (two) times daily., Disp: , Rfl:  .  zolpidem (AMBIEN) 10 MG tablet, TAKE 1 TABLET BY MOUTH EVERY DAY AT BEDTIME, Disp: 90 tablet, Rfl: 0  Allergies  Allergen Reactions  . Raloxifene     bone pain  . Sulfa Antibiotics Nausea Only    Intolerance rather than allergy.     ROS  Ten systems reviewed and is negative except as mentioned in HPI   Objective  Vitals:   10/31/16 1354  BP: 132/78  Pulse: 84  Resp: 16  Temp: 98.1 F (36.7 C)  TempSrc: Oral  SpO2: 97%  Weight: 153 lb (69.4 kg)  Height: '5\' 6"'  (1.676 m)    Body mass index is 24.69 kg/m.  Physical Exam  Constitutional: Patient appears well-developed and well-nourished.  No distress.  HEENT: head atraumatic, normocephalic, pupils equal and reactive to light, neck supple, throat within normal limits, no thyromegaly, neck fullness seems to be fatty tissue - normal  Cardiovascular: Normal rate, regular rhythm and normal heart sounds.  No murmur heard. No BLE edema. Pulmonary/Chest: Effort normal and breath sounds normal. No respiratory distress. Abdominal: Soft.  There is no tenderness. Psychiatric: Patient has a normal mood and affect. behavior  is normal. Judgment and thought content normal.  Recent Results (from the past 2160 hour(s))  POCT glycosylated hemoglobin (Hb A1C)     Status: Normal   Collection Time: 09/07/16 11:47 AM  Result Value Ref Range   Hemoglobin A1C 5.9   COMPLETE METABOLIC PANEL WITH GFR     Status: Abnormal   Collection Time: 09/07/16 11:55 AM  Result Value Ref Range   Sodium 141 135 - 146 mmol/L   Potassium 4.0 3.5 - 5.3 mmol/L   Chloride 108 98 - 110 mmol/L   CO2 22 20 - 31 mmol/L   Glucose, Bld 102 (H) 65 - 99 mg/dL  BUN 18 7 - 25 mg/dL   Creat 1.11 (H) 0.50 - 0.99 mg/dL    Comment:   For patients > or = 68 years of age: The upper reference limit for Creatinine is approximately 13% higher for people identified as African-American.      Total Bilirubin 0.7 0.2 - 1.2 mg/dL   Alkaline Phosphatase 59 33 - 130 U/L   AST 32 10 - 35 U/L   ALT 16 6 - 29 U/L   Total Protein 7.4 6.1 - 8.1 g/dL   Albumin 4.5 3.6 - 5.1 g/dL   Calcium 9.8 8.6 - 10.4 mg/dL   GFR, Est African American 59 (L) >=60 mL/min   GFR, Est Non African American 52 (L) >=60 mL/min  TSH     Status: Abnormal   Collection Time: 09/07/16 11:55 AM  Result Value Ref Range   TSH 0.20 (L) mIU/L    Comment:   Reference Range   > or = 20 Years  0.40-4.50   Pregnancy Range First trimester  0.26-2.66 Second trimester 0.55-2.73 Third trimester  0.43-2.91     Vitamin [name]     Status: None   Collection Time: 09/07/16 11:55 AM  Result Value Ref Range   Vitamin B-12 270 200 - 1,100 pg/mL  CBC with Differential/Platelet     Status: None   Collection Time: 09/07/16 11:55 AM  Result Value Ref Range   WBC 7.6 3.8 - 10.8 K/uL   RBC 4.71 3.80 - 5.10 MIL/uL   Hemoglobin 14.3 11.7 - 15.5 g/dL   HCT 43.4 35.0 - 45.0 %   MCV 92.1 80.0 - 100.0 fL   MCH 30.4 27.0 - 33.0 pg   MCHC 32.9 32.0 - 36.0 g/dL   RDW 13.2 11.0 - 15.0 %   Platelets 300 140 - 400 K/uL   MPV 11.0 7.5 - 12.5 fL   Neutro Abs 5,092 1,500 - 7,800 cells/uL   Lymphs Abs  1,368 850 - 3,900 cells/uL   Monocytes Absolute 760 200 - 950 cells/uL   Eosinophils Absolute 304 15 - 500 cells/uL   Basophils Absolute 76 0 - 200 cells/uL   Neutrophils Relative % 67 %   Lymphocytes Relative 18 %   Monocytes Relative 10 %   Eosinophils Relative 4 %   Basophils Relative 1 %   Smear Review Criteria for review not met   Glucose, capillary     Status: Abnormal   Collection Time: 09/17/16  5:29 PM  Result Value Ref Range   Glucose-Capillary 114 (H) 65 - 99 mg/dL  Basic metabolic panel     Status: Abnormal   Collection Time: 09/17/16  5:30 PM  Result Value Ref Range   Sodium 139 135 - 145 mmol/L   Potassium 3.7 3.5 - 5.1 mmol/L   Chloride 107 101 - 111 mmol/L   CO2 25 22 - 32 mmol/L   Glucose, Bld 127 (H) 65 - 99 mg/dL   BUN 15 6 - 20 mg/dL   Creatinine, Ser 1.07 (H) 0.44 - 1.00 mg/dL   Calcium 9.7 8.9 - 10.3 mg/dL   GFR calc non Af Amer 52 (L) >60 mL/min   GFR calc Af Amer >60 >60 mL/min    Comment: (NOTE) The eGFR has been calculated using the CKD EPI equation. This calculation has not been validated in all clinical situations. eGFR's persistently <60 mL/min signify possible Chronic Kidney Disease.    Anion gap 7 5 - 15  CBC     Status:  None   Collection Time: 09/17/16  5:30 PM  Result Value Ref Range   WBC 6.5 3.6 - 11.0 K/uL   RBC 4.57 3.80 - 5.20 MIL/uL   Hemoglobin 14.2 12.0 - 16.0 g/dL   HCT 40.9 35.0 - 47.0 %   MCV 89.5 80.0 - 100.0 fL   MCH 31.1 26.0 - 34.0 pg   MCHC 34.8 32.0 - 36.0 g/dL   RDW 13.1 11.5 - 14.5 %   Platelets 255 150 - 440 K/uL  Urinalysis complete, with microscopic     Status: Abnormal   Collection Time: 09/17/16  5:30 PM  Result Value Ref Range   Color, Urine STRAW (A) YELLOW   APPearance CLEAR (A) CLEAR   Glucose, UA NEGATIVE NEGATIVE mg/dL   Bilirubin Urine NEGATIVE NEGATIVE   Ketones, ur NEGATIVE NEGATIVE mg/dL   Specific Gravity, Urine 1.005 1.005 - 1.030   Hgb urine dipstick 1+ (A) NEGATIVE   pH 5.0 5.0 - 8.0    Protein, ur NEGATIVE NEGATIVE mg/dL   Nitrite NEGATIVE NEGATIVE   Leukocytes, UA NEGATIVE NEGATIVE   RBC / HPF 0-5 0 - 5 RBC/hpf   WBC, UA 0-5 0 - 5 WBC/hpf   Bacteria, UA NONE SEEN NONE SEEN   Squamous Epithelial / LPF 0-5 (A) NONE SEEN   Mucous PRESENT   Troponin I     Status: None   Collection Time: 09/17/16  5:30 PM  Result Value Ref Range   Troponin I <0.03 <0.03 ng/mL  Urine Culture     Status: Abnormal   Collection Time: 09/21/16 12:00 AM  Result Value Ref Range   Urine Culture, Routine Final report (A)    Urine Culture result 1 Klebsiella pneumoniae (A)     Comment: Greater than 100,000 colony forming units per mL Cefazolin <=4 ug/mL Cefazolin with an MIC <=16 predicts susceptibility to the oral agents cefaclor, cefdinir, cefpodoxime, cefprozil, cefuroxime, cephalexin, and loracarbef when used for therapy of uncomplicated urinary tract infections due to E. coli, Klebsiella pneumoniae, and Proteus mirabilis.    ANTIMICROBIAL SUSCEPTIBILITY Comment     Comment:       ** S = Susceptible; I = Intermediate; R = Resistant **                    P = Positive; N = Negative             MICS are expressed in micrograms per mL    Antibiotic                 RSLT#1    RSLT#2    RSLT#3    RSLT#4 Amoxicillin/Clavulanic Acid    S Ampicillin                     R Cefepime                       S Ceftriaxone                    S Cefuroxime                     S Cephalothin                    S Ciprofloxacin                  S Ertapenem  S Gentamicin                     S Imipenem                       S Levofloxacin                   S Nitrofurantoin                 I Piperacillin                   S Tetracycline                   S Tobramycin                     S Trimethoprim/Sulfa             S   POCT urinalysis dipstick     Status: Abnormal   Collection Time: 10/04/16 10:23 AM  Result Value Ref Range   Color, UA yellow    Clarity, UA clear    Glucose,  UA neg    Bilirubin, UA neg    Ketones, UA neg    Spec Grav, UA 1.015    Blood, UA small    pH, UA 6.0    Protein, UA neg    Urobilinogen, UA negative    Nitrite, UA neg    Leukocytes, UA Negative Negative  Urine Culture     Status: None   Collection Time: 10/04/16 10:27 AM  Result Value Ref Range   Organism ID, Bacteria      Three or more organisms present,each greater than 10,000 CFU/mL.These organisms,commonly found on external and internal genitalia,are considered to be colonizers.No further testing performed.   TSH     Status: Abnormal   Collection Time: 10/17/16 11:44 AM  Result Value Ref Range   TSH 8.87 (H) mIU/L    Comment:   Reference Range   > or = 20 Years  0.40-4.50   Pregnancy Range First trimester  0.26-2.66 Second trimester 0.55-2.73 Third trimester  0.43-2.91       PHQ2/9: Depression screen [facility] 2/9 10/31/2016 10/04/2016 09/20/2016 09/07/2016 03/07/2016  Decreased Interest 0 0 0 0 0  Down, Depressed, Hopeless 0 0 0 0 0  PHQ - 2 Score 0 0 0 0 0     Fall Risk: Fall Risk  10/31/2016 10/04/2016 09/20/2016 09/07/2016 03/07/2016  Falls in the past year? No No No No No    Assessment & Plan  1. Adult hypothyroidism  - TSH  2. GAD (generalized anxiety disorder)  She is doing better, she is still weaning down on Alprazolam and will call back when she has 5 pills left for a refill - citalopram (CELEXA) 10 MG tablet; Take 1-2 tablets (10-20 mg total) by mouth daily.  Dispense: 90 tablet; Refill: 1  3. Benign hypertension  Consider going down on dose of BP

## 2016-11-07 ENCOUNTER — Ambulatory Visit: Payer: PPO | Admitting: Family Medicine

## 2016-11-14 ENCOUNTER — Ambulatory Visit (INDEPENDENT_AMBULATORY_CARE_PROVIDER_SITE_OTHER): Payer: PPO

## 2016-11-14 DIAGNOSIS — D519 Vitamin B12 deficiency anemia, unspecified: Secondary | ICD-10-CM | POA: Diagnosis not present

## 2016-11-14 MED ORDER — CYANOCOBALAMIN 1000 MCG/ML IJ SOLN
1000.0000 ug | Freq: Once | INTRAMUSCULAR | Status: AC
  Administered 2016-11-14: 1000 ug via INTRAMUSCULAR

## 2016-11-17 NOTE — Discharge Instructions (Signed)

## 2016-11-21 ENCOUNTER — Ambulatory Visit: Payer: PPO | Admitting: Anesthesiology

## 2016-11-21 ENCOUNTER — Encounter
Admission: RE | Disposition: A | Discharge status: Home / Self Care | Payer: Self-pay | Source: Ambulatory Visit | Attending: Ophthalmology

## 2016-11-21 ENCOUNTER — Encounter: Payer: Self-pay | Admitting: Family Medicine

## 2016-11-21 ENCOUNTER — Ambulatory Visit
Admission: RE | Admit: 2016-11-21 | Discharge: 2016-11-21 | Disposition: A | Discharge status: Home / Self Care | Payer: PPO | Source: Ambulatory Visit | Attending: Ophthalmology | Admitting: Ophthalmology

## 2016-11-21 DIAGNOSIS — Z90710 Acquired absence of both cervix and uterus: Secondary | ICD-10-CM | POA: Insufficient documentation

## 2016-11-21 DIAGNOSIS — H401113 Primary open-angle glaucoma, right eye, severe stage: Secondary | ICD-10-CM | POA: Diagnosis present

## 2016-11-21 DIAGNOSIS — F419 Anxiety disorder, unspecified: Secondary | ICD-10-CM | POA: Insufficient documentation

## 2016-11-21 DIAGNOSIS — H401112 Primary open-angle glaucoma, right eye, moderate stage: Secondary | ICD-10-CM | POA: Diagnosis not present

## 2016-11-21 DIAGNOSIS — H401130 Primary open-angle glaucoma, bilateral, stage unspecified: Secondary | ICD-10-CM | POA: Insufficient documentation

## 2016-11-21 DIAGNOSIS — K219 Gastro-esophageal reflux disease without esophagitis: Secondary | ICD-10-CM | POA: Insufficient documentation

## 2016-11-21 DIAGNOSIS — I10 Essential (primary) hypertension: Secondary | ICD-10-CM | POA: Insufficient documentation

## 2016-11-21 DIAGNOSIS — E039 Hypothyroidism, unspecified: Secondary | ICD-10-CM | POA: Insufficient documentation

## 2016-11-21 DIAGNOSIS — H401132 Primary open-angle glaucoma, bilateral, moderate stage: Secondary | ICD-10-CM | POA: Diagnosis not present

## 2016-11-21 HISTORY — DX: Disorder of kidney and ureter, unspecified: N28.9

## 2016-11-21 HISTORY — PX: PHOTOCOAGULATION WITH LASER: SHX6027

## 2016-11-21 SURGERY — PHOTOCOAGULATION WITH LASER
Anesthesia: Monitor Anesthesia Care | Site: Eye | Laterality: Right | Wound class: Clean

## 2016-11-21 MED ORDER — TETRACAINE HCL 0.5 % OP SOLN
OPHTHALMIC | Status: DC | PRN
  Administered 2016-11-21: 2 [drp] via OPHTHALMIC

## 2016-11-21 MED ORDER — ALFENTANIL 500 MCG/ML IJ INJ
INJECTION | INTRAMUSCULAR | Status: DC | PRN
  Administered 2016-11-21: 1000 ug via INTRAVENOUS

## 2016-11-21 MED ORDER — MIDAZOLAM HCL 2 MG/2ML IJ SOLN
INTRAMUSCULAR | Status: DC | PRN
  Administered 2016-11-21: 2 mg via INTRAVENOUS

## 2016-11-21 SURGICAL SUPPLY — 10 items
BANDAGE EYE OVAL (MISCELLANEOUS)
DEVICE MICRO PULS P3 SGL USE (Laser) ×2
G-PROBE SGL USE (Laser)
GAUZE SPONGE 4X4 12PLY STRL (GAUZE/BANDAGES/DRESSINGS) ×2
NDL RETROBULBAR .5 NSTRL (NEEDLE) ×2
NEEDLE FILTER BLUNT 18X 1/2SAF (NEEDLE)
NEEDLE FILTER BLUNT 18X1 1/2 (NEEDLE)
SYRINGE 10CC LL (SYRINGE)
WATER STERILE IRR 250ML POUR (IV SOLUTION) ×2
WATER STERILE IRR 500ML POUR (IV SOLUTION)

## 2016-11-21 NOTE — Op Note (Signed)
DATE OF SURGERY: 11/21/2016  PREOPERATIVE DIAGNOSES: Severe stage primary open angle glaucoma, right eye  POSTOPERATIVE DIAGNOSES: Same  PROCEDURES PERFORMED: Transscleral diode cyclophotocoagulation, right eye  SURGEON: Almon Hercules, M.D.  ANESTHESIA: MAC  COMPLICATIONS: None.  INDICATIONS FOR PROCEDURE: Dawn Fuentes is a 70 y.o. year-old female with uncontrolled primary open angle glaucoma. The risks and benefits of glaucoma surgery were discussed with the patient, and she consented for a diode laser surgery.  PROCEDURE IN DETAIL: The eye for surgery was verified during the time-out procedure in the operating room. A micropulse probe was applied to each hemilimbus with the following settings: 2087mW, 31.3% duty cycle, 90 seconds x 3 applications. The patient tolerated the procedure well and was transferred to the Post-operative Care Unit in stable condition.

## 2016-11-21 NOTE — Anesthesia Postprocedure Evaluation (Signed)
Anesthesia Post Note  Patient: Dawn Fuentes  Procedure(s) Performed: Procedure(s) (LRB): PHOTOCOAGULATION WITH LASER (Right)  Patient location during evaluation: PACU Anesthesia Type: MAC Level of consciousness: awake and alert and oriented Pain management: satisfactory to patient Vital Signs Assessment: post-procedure vital signs reviewed and stable Respiratory status: spontaneous breathing, nonlabored ventilation and respiratory function stable Cardiovascular status: blood pressure returned to baseline and stable Postop Assessment: Adequate PO intake and No signs of nausea or vomiting Anesthetic complications: no    Raliegh Ip

## 2016-11-21 NOTE — H&P (Signed)
H+P reviewed and is up to date, please see paper chart.  

## 2016-11-21 NOTE — Anesthesia Procedure Notes (Addendum)
Procedure Name: MAC Date/Time: 11/21/2016 10:08 AM Performed by: Londell Moh Pre-anesthesia Checklist: Patient identified, Emergency Drugs available, Suction available, Timeout performed and Patient being monitored Patient Re-evaluated:Patient Re-evaluated prior to inductionOxygen Delivery Method: Nasal cannula Placement Confirmation: positive ETCO2

## 2016-11-21 NOTE — OR Nursing (Signed)
.   A micropulse probe was applied to each hemilimbus with the following settings: 2062mW, 31.3% duty cycle, 90 seconds x 3 applications.

## 2016-11-21 NOTE — Anesthesia Preprocedure Evaluation (Signed)
Anesthesia Evaluation  Patient identified by MRN, date of birth, ID band Patient awake    Reviewed: Allergy & Precautions, H&P , NPO status , Patient's Chart, lab work & pertinent test results  Airway Mallampati: II  TM Distance: >3 FB Neck ROM: full    Dental no notable dental hx.    Pulmonary    Pulmonary exam normal        Cardiovascular hypertension, Normal cardiovascular exam     Neuro/Psych    GI/Hepatic GERD  ,  Endo/Other  Hypothyroidism   Renal/GU Renal disease     Musculoskeletal   Abdominal   Peds  Hematology   Anesthesia Other Findings   Reproductive/Obstetrics                             Anesthesia Physical Anesthesia Plan  ASA: II  Anesthesia Plan: MAC   Post-op Pain Management:    Induction:   Airway Management Planned:   Additional Equipment:   Intra-op Plan:   Post-operative Plan:   Informed Consent: I have reviewed the patients History and Physical, chart, labs and discussed the procedure including the risks, benefits and alternatives for the proposed anesthesia with the patient or authorized representative who has indicated his/her understanding and acceptance.     Plan Discussed with:   Anesthesia Plan Comments:         Anesthesia Quick Evaluation

## 2016-11-21 NOTE — Transfer of Care (Signed)
Immediate Anesthesia Transfer of Care Note  Patient: Dawn Fuentes  Procedure(s) Performed: Procedure(s) with comments: PHOTOCOAGULATION WITH LASER (Right) - PT WANTS LATER MORNING RIGHT  Patient Location: PACU  Anesthesia Type: MAC  Level of Consciousness: awake, alert  and patient cooperative  Airway and Oxygen Therapy: Patient Spontanous Breathing and Patient connected to supplemental oxygen  Post-op Assessment: Post-op Vital signs reviewed, Patient's Cardiovascular Status Stable, Respiratory Function Stable, Patent Airway and No signs of Nausea or vomiting  Post-op Vital Signs: Reviewed and stable  Complications: No apparent anesthesia complications

## 2016-11-22 ENCOUNTER — Encounter: Payer: Self-pay | Admitting: Ophthalmology

## 2016-11-22 ENCOUNTER — Other Ambulatory Visit: Payer: Self-pay | Admitting: Family Medicine

## 2016-11-22 DIAGNOSIS — K219 Gastro-esophageal reflux disease without esophagitis: Secondary | ICD-10-CM

## 2016-11-22 NOTE — Telephone Encounter (Signed)
Patient requesting refill of Losartan to [facility].  

## 2016-12-01 ENCOUNTER — Other Ambulatory Visit: Payer: Self-pay | Admitting: Family Medicine

## 2016-12-01 DIAGNOSIS — E039 Hypothyroidism, unspecified: Secondary | ICD-10-CM | POA: Diagnosis not present

## 2016-12-02 ENCOUNTER — Other Ambulatory Visit: Payer: Self-pay | Admitting: Family Medicine

## 2016-12-02 DIAGNOSIS — E038 Other specified hypothyroidism: Secondary | ICD-10-CM

## 2016-12-02 LAB — TSH: TSH: 0.06 m[IU]/L — ABNORMAL LOW

## 2016-12-02 MED ORDER — LEVOTHYROXINE SODIUM 88 MCG PO TABS: 44.0000 ug | ORAL_TABLET | Freq: Every day | ORAL | 0 refills | Status: DC

## 2016-12-26 ENCOUNTER — Ambulatory Visit (INDEPENDENT_AMBULATORY_CARE_PROVIDER_SITE_OTHER): Payer: PPO

## 2016-12-26 DIAGNOSIS — E538 Deficiency of other specified B group vitamins: Secondary | ICD-10-CM | POA: Diagnosis not present

## 2016-12-26 MED ORDER — CYANOCOBALAMIN 1000 MCG/ML IJ SOLN
1000.0000 ug | Freq: Once | INTRAMUSCULAR | Status: AC
  Administered 2016-12-26: 1000 ug via INTRAMUSCULAR

## 2016-12-28 ENCOUNTER — Other Ambulatory Visit: Payer: Self-pay | Admitting: Family Medicine

## 2016-12-28 DIAGNOSIS — K219 Gastro-esophageal reflux disease without esophagitis: Secondary | ICD-10-CM

## 2016-12-28 DIAGNOSIS — G4700 Insomnia, unspecified: Secondary | ICD-10-CM

## 2016-12-28 NOTE — Telephone Encounter (Signed)
Patient requesting refill of Protonix and Ambien to [facility].

## 2017-01-12 ENCOUNTER — Other Ambulatory Visit: Payer: Self-pay | Admitting: Family Medicine

## 2017-01-12 DIAGNOSIS — E039 Hypothyroidism, unspecified: Secondary | ICD-10-CM | POA: Diagnosis not present

## 2017-01-12 LAB — TSH: TSH: 0.18 m[IU]/L — ABNORMAL LOW

## 2017-01-13 ENCOUNTER — Other Ambulatory Visit: Payer: Self-pay

## 2017-01-13 ENCOUNTER — Other Ambulatory Visit: Payer: Self-pay | Admitting: Family Medicine

## 2017-01-13 DIAGNOSIS — E038 Other specified hypothyroidism: Secondary | ICD-10-CM

## 2017-01-13 DIAGNOSIS — E039 Hypothyroidism, unspecified: Secondary | ICD-10-CM

## 2017-01-13 MED ORDER — LEVOTHYROXINE SODIUM 88 MCG PO TABS: 44.0000 ug | ORAL_TABLET | Freq: Every day | ORAL | 0 refills | Status: DC

## 2017-01-27 ENCOUNTER — Telehealth: Payer: Self-pay | Admitting: Family Medicine

## 2017-01-27 NOTE — Telephone Encounter (Signed)
You called pt this morning and wanted to know if she had received message about blood work pertaining to thyroids. States she did get that info and did change medication to half pill daily.

## 2017-01-27 NOTE — Telephone Encounter (Signed)
Ok thanks for letting us know

## 2017-02-02 ENCOUNTER — Other Ambulatory Visit: Payer: Self-pay | Admitting: Family Medicine

## 2017-02-02 DIAGNOSIS — F411 Generalized anxiety disorder: Secondary | ICD-10-CM

## 2017-02-02 NOTE — Telephone Encounter (Signed)
Patient requesting refill of Alprazolam to [facility],

## 2017-02-07 ENCOUNTER — Ambulatory Visit (INDEPENDENT_AMBULATORY_CARE_PROVIDER_SITE_OTHER): Payer: PPO

## 2017-02-07 DIAGNOSIS — E538 Deficiency of other specified B group vitamins: Secondary | ICD-10-CM | POA: Diagnosis not present

## 2017-02-07 MED ORDER — CYANOCOBALAMIN 1000 MCG/ML IJ SOLN
1000.0000 ug | Freq: Once | INTRAMUSCULAR | Status: AC
  Administered 2017-02-07: 1000 ug via INTRAMUSCULAR

## 2017-02-09 DIAGNOSIS — H401132 Primary open-angle glaucoma, bilateral, moderate stage: Secondary | ICD-10-CM | POA: Diagnosis not present

## 2017-03-08 ENCOUNTER — Encounter: Payer: Self-pay | Admitting: Family Medicine

## 2017-03-08 ENCOUNTER — Ambulatory Visit (INDEPENDENT_AMBULATORY_CARE_PROVIDER_SITE_OTHER): Payer: PPO | Admitting: Family Medicine

## 2017-03-08 VITALS — BP 132/68 | HR 79 | Temp 97.8°F | Resp 16 | Ht 66.0 in | Wt 159.6 lb

## 2017-03-08 DIAGNOSIS — E785 Hyperlipidemia, unspecified: Secondary | ICD-10-CM | POA: Diagnosis not present

## 2017-03-08 DIAGNOSIS — M810 Age-related osteoporosis without current pathological fracture: Secondary | ICD-10-CM | POA: Diagnosis not present

## 2017-03-08 DIAGNOSIS — J3089 Other allergic rhinitis: Secondary | ICD-10-CM

## 2017-03-08 DIAGNOSIS — J302 Other seasonal allergic rhinitis: Secondary | ICD-10-CM

## 2017-03-08 DIAGNOSIS — Z23 Encounter for immunization: Secondary | ICD-10-CM | POA: Diagnosis not present

## 2017-03-08 DIAGNOSIS — I10 Essential (primary) hypertension: Secondary | ICD-10-CM | POA: Diagnosis not present

## 2017-03-08 DIAGNOSIS — E538 Deficiency of other specified B group vitamins: Secondary | ICD-10-CM

## 2017-03-08 DIAGNOSIS — N183 Chronic kidney disease, stage 3 unspecified: Secondary | ICD-10-CM

## 2017-03-08 DIAGNOSIS — R7303 Prediabetes: Secondary | ICD-10-CM

## 2017-03-08 DIAGNOSIS — F411 Generalized anxiety disorder: Secondary | ICD-10-CM | POA: Diagnosis not present

## 2017-03-08 DIAGNOSIS — K219 Gastro-esophageal reflux disease without esophagitis: Secondary | ICD-10-CM | POA: Diagnosis not present

## 2017-03-08 DIAGNOSIS — Z0000 Encounter for general adult medical examination without abnormal findings: Secondary | ICD-10-CM

## 2017-03-08 DIAGNOSIS — E039 Hypothyroidism, unspecified: Secondary | ICD-10-CM | POA: Diagnosis not present

## 2017-03-08 MED ORDER — SIMVASTATIN 40 MG PO TABS: 40.0000 mg | ORAL_TABLET | Freq: Every day | ORAL | 1 refills | Status: DC

## 2017-03-08 MED ORDER — FLUTICASONE PROPIONATE 50 MCG/ACT NA SUSP: 2.0000 | Freq: Every day | NASAL | 2 refills | Status: DC

## 2017-03-08 MED ORDER — PANTOPRAZOLE SODIUM 40 MG PO TBEC: DELAYED_RELEASE_TABLET | ORAL | 1 refills | Status: DC

## 2017-03-08 MED ORDER — LOSARTAN POTASSIUM 100 MG PO TABS: ORAL_TABLET | ORAL | 1 refills | Status: DC

## 2017-03-08 MED ORDER — CITALOPRAM HYDROBROMIDE 10 MG PO TABS: 10.0000 mg | ORAL_TABLET | Freq: Every day | ORAL | 1 refills | Status: DC

## 2017-03-08 MED ORDER — CYANOCOBALAMIN 1000 MCG/ML IJ SOLN
1000.0000 ug | Freq: Once | INTRAMUSCULAR | Status: AC
  Administered 2017-03-08: 1000 ug via INTRAMUSCULAR

## 2017-03-08 MED ORDER — LOSARTAN POTASSIUM 50 MG PO TABS: ORAL_TABLET | ORAL | 1 refills | Status: DC

## 2017-03-08 NOTE — Patient Instructions (Signed)
  Dawn Fuentes , Thank you for taking time to come for your Medicare Wellness Visit. I appreciate your ongoing commitment to your health goals. Please review the following plan we discussed and let me know if I can assist you in the future.   These are the goals we discussed:  To lose weight: by stopping drinking sweet tea, and ordering more salads and vegetables when goes to eat    This is a list of the screening recommended for you and due dates:  Health Maintenance  Topic Date Due  . Mammogram  05/17/2018  . Colon Cancer Screening  05/28/2020  . Tetanus Vaccine  08/18/2020  . Flu Shot  Completed  . DEXA scan (bone density measurement)  Completed  .  Hepatitis C: One time screening is recommended by Center for Disease Control  (CDC) for  adults born from [birth year] through 1963.   Completed  . Pneumonia vaccines  Completed

## 2017-03-08 NOTE — Progress Notes (Signed)
Name: Dawn Fuentes   MRN: [id number]    DOB: 03/15/1946   Date:03/08/2017       Progress Note  Subjective  Chief Complaint  Chief Complaint  Patient presents with  . Annual Exam    HPI  Well Woman: she is not sexually active, widow, her grand-daughter still comes to her house after school, no breast lumps, s/p hysterectomy, no bladder problems, still hanging out with her friends. [place] woman[employer].  Functional ability/safety issues: No Issues Hearing issues: Addressed  Activities of daily living: Discussed Home safety issues: No Issues  End Of Life Planning: Offered verbal information regarding advanced directives, healthcare power of attorney.  Preventative care, Health maintenance, Preventative health measures discussed.  Preventative screenings discussed today: lab work, colonoscopy,  mammogram, DEXA.  Low Dose CT Chest recommended if Age 36-80 years, 30 pack-year currently smoking OR have quit w/in 15years.   Lifestyle risk factor issued reviewed: Diet, exercise, weight management, advised patient smoking is not healthy, nutrition/diet.  Preventative health measures discussed (5-10 year plan).  Reviewed and recommended vaccinations: - Pneumovax  - Prevnar  - Annual Influenza - Zostavax - Tdap   Depression screening: Done Fall risk screening: Done Discuss ADLs/IADLs: Done  Current medical providers: See HPI  Other health risk factors identified this visit: No other issues Cognitive impairment issues: None identified  All above discussed with patient. Appropriate education, counseling and referral will be made based upon the above.   GAD: she is feeling much better on Citalopram 10 mg, unable to tolerate 20 mg, it made her feel like a " space cadet ", she has noticed decrease in anxiety with Citalopram, filled last bottle of Alprazolam about one month ago and has not taken any pills.   HTN: bp at home is 120's  even without added Toprol XL, she is  currently only taking one third dose of Losartan, but advised to take half pill since bp is towards high end of normal today. No dizziness any more.    CKI: we will monitor, she avoid NSAID's   Patient Active Problem List   Diagnosis Date Noted  . Primary open angle glaucoma of both eyes 11/21/2016  . Chronic kidney disease (CKD), stage III (moderate) 04/06/2016  . Cystitis 10/12/2015  . Anxiety state 08/21/2015  . Benign hypertension 08/21/2015  . Insomnia, persistent 08/21/2015  . Dyslipidemia 08/21/2015  . Gastro-esophageal reflux disease without esophagitis 08/21/2015  . Glaucoma 08/21/2015  . Blood glucose elevated 08/21/2015  . Adult hypothyroidism 08/21/2015  . Climacteric 08/21/2015  . Senile osteoporosis 08/21/2015  . Seasonal allergies 08/21/2015  . Female genuine stress incontinence 08/21/2015  . Vitamin D deficiency 08/21/2015    Past Surgical History:  Procedure Laterality Date  . ABDOMINAL HYSTERECTOMY     21 years ago  . APPENDECTOMY    . COLONOSCOPY WITH PROPOFOL N/A 05/29/2015   Procedure: COLONOSCOPY WITH PROPOFOL;  Surgeon: Hulen Luster, MD;  Location: [facility] ENDOSCOPY;  Service: Gastroenterology;  Laterality: N/A;  . ESOPHAGOGASTRODUODENOSCOPY N/A 05/29/2015   Procedure: ESOPHAGOGASTRODUODENOSCOPY (EGD);  Surgeon: Hulen Luster, MD;  Location: [facility] ENDOSCOPY;  Service: Gastroenterology;  Laterality: N/A;  . EYE SURGERY     eye lids  . OVARIAN CYST REMOVAL     needed transfusion  . PHOTOCOAGULATION WITH LASER Right 11/21/2016   Procedure: PHOTOCOAGULATION WITH LASER;  Surgeon: Ronnell Freshwater, MD;  Location: [facility];  Service: Ophthalmology;  Laterality: Right;  A micropulse probe was applied to each hemilimbus with the  following settings: 2016mW, 31.3% duty cycle, 90 seconds x 3 applications.     Family History  Problem Relation Age of Onset  . Hypertension Mother   . Osteoporosis Mother   . COPD Mother   . Dementia Mother   . Cancer Father      Colon    Social History   Social History  . Marital status: Widowed    Spouse name: N/A  . Number of children: N/A  . Years of education: N/A   Occupational History  . Not on file.   Social History Main Topics  . Smoking status: Never Smoker  . Smokeless tobacco: Never Used  . Alcohol use No  . Drug use: No  . Sexual activity: Not Currently   Other Topics Concern  . Not on file   Social History Narrative  . No narrative on file     Current Outpatient Prescriptions:  .  ALPRAZolam (XANAX) 0.5 MG tablet, TAKE 1 TABLET BY MOUTH DAILY AS NEEDED FOR ANXIETY. MUST LAST 90 DAYS, Disp: 40 tablet, Rfl: 0 .  aspirin 81 MG tablet, Take 81 mg by mouth daily. pm, Disp: , Rfl:  .  cholecalciferol (VITAMIN D) 1000 units tablet, Take 1,000 Units by mouth 2 (two) times daily. am, Disp: , Rfl:  .  citalopram (CELEXA) 10 MG tablet, Take 1 tablet (10 mg total) by mouth daily., Disp: 90 tablet, Rfl: 1 .  fluticasone (FLONASE) 50 MCG/ACT nasal spray, Place 2 sprays into both nostrils daily. Pm, Disp: 16 g, Rfl: 2 .  levothyroxine (SYNTHROID, LEVOTHROID) 88 MCG tablet, Take 0.5 tablets (44 mcg total) by mouth daily before breakfast., Disp: 90 tablet, Rfl: 0 .  loratadine (CLARITIN) 10 MG tablet, Take 10 mg by mouth daily as needed for allergies., Disp: , Rfl:  .  losartan (COZAAR) 100 MG tablet, TAKE 1 TABLET(100 MG) BY MOUTH DAILY, Disp: 90 tablet, Rfl: 1 .  montelukast (SINGULAIR) 10 MG tablet, Take 1 tablet (10 mg total) by mouth daily as needed (allergies)., Disp: 90 tablet, Rfl: 3 .  Omega-3 Fatty Acids (FISH OIL PO), Take by mouth. AM, Disp: , Rfl:  .  pantoprazole (PROTONIX) 40 MG tablet, TAKE 1 TABLET(40 MG) BY MOUTH DAILY, Disp: 90 tablet, Rfl: 1 .  PAZEO 0.7 % SOLN, INSTILL 1 GTT IN OU QD/ pm, Disp: , Rfl: 6 .  simvastatin (ZOCOR) 40 MG tablet, Take 1 tablet (40 mg total) by mouth daily. pm, Disp: 90 tablet, Rfl: 1 .  timolol (BETIMOL) 0.25 % ophthalmic solution, 1-2 drops 2 (two)  times daily. Am and pm, Disp: , Rfl:  .  zolpidem (AMBIEN) 10 MG tablet, TAKE 1 TABLET BY MOUTH EVERY DAY AT BEDTIME, Disp: 90 tablet, Rfl: 0  Allergies  Allergen Reactions  . Brimonidine Tartrate   . Latanoprost   . Raloxifene     bone pain  . Sulfa Antibiotics Nausea Only    Intolerance rather than allergy.  . Timolol Maleate      ROS  Constitutional: Negative for fever, positive for  weight change.  Respiratory: Negative for cough and shortness of breath.   Cardiovascular: Negative for chest pain or palpitations.  Gastrointestinal: Negative for abdominal pain, no bowel changes.  Musculoskeletal: Negative for gait problem or joint swelling.  Skin: Negative for rash.  Neurological: Negative for dizziness or headache.  No other specific complaints in a complete review of systems (except as listed in HPI above).  Objective  Vitals:   03/08/17 1053  BP:  132/68  Pulse: 79  Resp: 16  Temp: 97.8 F (36.6 C)  SpO2: 94%  Weight: 159 lb 9 oz (72.4 kg)  Height: 5\' 6"  (1.676 m)    Body mass index is 25.75 kg/m.  Physical Exam  Constitutional: Patient appears well-developed and well-nourished. No distress.  HENT: Head: Normocephalic and atraumatic. Ears: B TMs ok, no erythema or effusion; Nose: Nose normal. Mouth/Throat: Oropharynx is clear and moist. No oropharyngeal exudate.  Eyes: Conjunctivae and EOM are normal. Pupils are equal, round, and reactive to light. No scleral icterus.  Neck: Normal range of motion. Neck supple. No JVD present. No thyromegaly present.  Cardiovascular: Normal rate, regular rhythm and normal heart sounds.  No murmur heard. No BLE edema. Pulmonary/Chest: Effort normal and breath sounds normal. No respiratory distress. Abdominal: Soft. Bowel sounds are normal, no distension. There is no tenderness. no masses Breast: no lumps or masses, no nipple discharge or rashes FEMALE GENITALIA:  External genitalia normal External urethra normal Pelvic not  done RECTAL: not done Musculoskeletal: Normal range of motion, no joint effusions. No gross deformities Neurological: he is alert and oriented to person, place, and time. No cranial nerve deficit. Coordination, balance, strength, speech and gait are normal.  Skin: Skin is warm and dry. No rash noted. No erythema.  Psychiatric: Patient has a normal mood and affect. behavior is normal. Judgment and thought content normal.  Recent Results (from the past 2160 hour(s))  TSH     Status: Abnormal   Collection Time: 01/12/17 11:37 AM  Result Value Ref Range   TSH 0.18 (L) mIU/L    Comment:   Reference Range   > or = 20 Years  0.40-4.50   Pregnancy Range First trimester  0.26-2.66 Second trimester 0.55-2.73 Third trimester  0.43-2.91        PHQ2/9: Depression screen [facility] 2/9 03/08/2017 10/31/2016 10/04/2016 09/20/2016 09/07/2016  Decreased Interest 0 0 0 0 0  Down, Depressed, Hopeless 0 0 0 0 0  PHQ - 2 Score 0 0 0 0 0     Fall Risk: Fall Risk  03/08/2017 10/31/2016 10/04/2016 09/20/2016 09/07/2016  Falls in the past year? No No No No No     Functional Status Survey: Is the patient deaf or have difficulty hearing?: No Does the patient have difficulty seeing, even when wearing glasses/contacts?: No Does the patient have difficulty concentrating, remembering, or making decisions?: No Does the patient have difficulty walking or climbing stairs?: No Does the patient have difficulty dressing or bathing?: No Does the patient have difficulty doing errands alone such as visiting a doctor's office or shopping?: No    Assessment & Plan  1. Medicare annual wellness visit, subsequent  Discussed importance of 150 minutes of physical activity weekly, eat two servings of fish weekly, eat one serving of tree nuts ( cashews, pistachios, pecans, almonds.Marland Kitchen) every other day, eat 6 servings of fruit/vegetables daily and drink plenty of water and avoid sweet beverages.   2. [name] deficiency  -  Vitamin [name]  3. Adult hypothyroidism  - TSH  4. GAD (generalized anxiety disorder)  - citalopram (CELEXA) 10 MG tablet; Take 1 tablet (10 mg total) by mouth daily.  Dispense: 90 tablet; Refill: 1  5. Benign hypertension  - CBC with Differential/Platelet - COMPLETE METABOLIC PANEL WITH GFR  6. Dyslipidemia  - simvastatin (ZOCOR) 40 MG tablet; Take 1 tablet (40 mg total) by mouth daily. pm  Dispense: 90 tablet; Refill: 1 - Lipid panel  7. Chronic kidney disease,  stage 3  - COMPLETE METABOLIC PANEL WITH GFR  8. Pre-diabetes  - Hemoglobin A1c - Insulin, fasting  9. Gastro-esophageal reflux disease without esophagitis  - losartan (COZAAR) 50 MG tablet; TAKE 1 TABLET(50 MG) BY MOUTH DAILY  Dispense: 90 tablet; Refill: 1 - pantoprazole (PROTONIX) 40 MG tablet; TAKE 1 TABLET(40 MG) BY MOUTH DAILY  Dispense: 90 tablet; Refill: 1  10. Perennial allergic rhinitis with seasonal variation  - fluticasone (FLONASE) 50 MCG/ACT nasal spray; Place 2 sprays into both nostrils daily. Pm  Dispense: 16 g; Refill: 2  11. Age-related osteoporosis without current pathological fracture  - VITAMIN D 25 Hydroxy (Vit-D Deficiency, Fractures)  12. Need for pneumococcal vaccine  - Pneumococcal conjugate vaccine 13-valent IM

## 2017-03-10 LAB — CBC WITH DIFFERENTIAL/PLATELET
Basophils Absolute: 147 {cells}/uL (ref 0–200)
Basophils Relative: 3 %
Eosinophils Absolute: 245 {cells}/uL (ref 15–500)
Eosinophils Relative: 5 %
HCT: 41.6 % (ref 35.0–45.0)
Hemoglobin: 13.8 g/dL (ref 11.7–15.5)
Lymphocytes Relative: 26 %
Lymphs Abs: 1274 {cells}/uL (ref 850–3900)
MCH: 30.5 pg (ref 27.0–33.0)
MCHC: 33.2 g/dL (ref 32.0–36.0)
MCV: 91.8 fL (ref 80.0–100.0)
MPV: 10.6 fL (ref 7.5–12.5)
Monocytes Absolute: 490 {cells}/uL (ref 200–950)
Monocytes Relative: 10 %
Neutro Abs: 2744 {cells}/uL (ref 1500–7800)
Neutrophils Relative %: 56 %
Platelets: 264 10*3/uL (ref 140–400)
RBC: 4.53 MIL/uL (ref 3.80–5.10)
RDW: 14.1 % (ref 11.0–15.0)
WBC: 4.9 10*3/uL (ref 3.8–10.8)

## 2017-03-11 LAB — VITAMIN D 25 HYDROXY (VIT D DEFICIENCY, FRACTURES): Vit D, 25-Hydroxy: 45 ng/mL (ref 30–100)

## 2017-03-11 LAB — HEMOGLOBIN A1C
Hgb A1c MFr Bld: 5.5 %
Mean Plasma Glucose: 111 mg/dL

## 2017-03-11 LAB — COMPLETE METABOLIC PANEL WITHOUT GFR
ALT: 28 U/L (ref 6–29)
AST: 31 U/L (ref 10–35)
Albumin: 4.2 g/dL (ref 3.6–5.1)
Alkaline Phosphatase: 50 U/L (ref 33–130)
BUN: 18 mg/dL (ref 7–25)
CO2: 30 mmol/L (ref 20–31)
Calcium: 9.3 mg/dL (ref 8.6–10.4)
Chloride: 104 mmol/L (ref 98–110)
Creat: 1.26 mg/dL — ABNORMAL HIGH (ref 0.50–0.99)
GFR, Est African American: 51 mL/min — ABNORMAL LOW
GFR, Est Non African American: 44 mL/min — ABNORMAL LOW
Glucose, Bld: 93 mg/dL (ref 65–99)
Potassium: 4.3 mmol/L (ref 3.5–5.3)
Sodium: 140 mmol/L (ref 135–146)
Total Bilirubin: 0.5 mg/dL (ref 0.2–1.2)
Total Protein: 6.6 g/dL (ref 6.1–8.1)

## 2017-03-11 LAB — LIPID PANEL
Cholesterol: 156 mg/dL
HDL: 70 mg/dL
LDL Cholesterol: 71 mg/dL
Total CHOL/HDL Ratio: 2.2 ratio
Triglycerides: 73 mg/dL
VLDL: 15 mg/dL

## 2017-03-11 LAB — TSH: TSH: 8.36 m[IU]/L — ABNORMAL HIGH

## 2017-03-11 LAB — VITAMIN B12: Vitamin B-12: 1194 pg/mL — ABNORMAL HIGH (ref 200–1100)

## 2017-03-13 LAB — INSULIN, FASTING: Insulin fasting, serum: 8.3 u[IU]/mL (ref 2.0–19.6)

## 2017-03-15 ENCOUNTER — Other Ambulatory Visit: Payer: Self-pay | Admitting: Family Medicine

## 2017-03-15 DIAGNOSIS — E038 Other specified hypothyroidism: Secondary | ICD-10-CM

## 2017-03-15 MED ORDER — LEVOTHYROXINE SODIUM 50 MCG PO TABS: 50.0000 ug | ORAL_TABLET | Freq: Every day | ORAL | 1 refills | Status: DC

## 2017-03-16 ENCOUNTER — Other Ambulatory Visit: Payer: Self-pay | Admitting: Family Medicine

## 2017-03-16 ENCOUNTER — Telehealth: Payer: Self-pay | Admitting: Family Medicine

## 2017-03-16 MED ORDER — FLUCONAZOLE 150 MG PO TABS: 150.0000 mg | ORAL_TABLET | Freq: Once | ORAL | 0 refills | Status: AC

## 2017-03-16 NOTE — Telephone Encounter (Signed)
Sent a prescription for Diflucan

## 2017-03-16 NOTE — Telephone Encounter (Signed)
PT SAID THAT WHEN SHE WAS HER LAST WEEK SHE HAD SOME ITCHING AND YOU HAD TOLD HER TO GET MONISTAT AND THAT HAS HELPED SOME BUT STILL HAVING IT. COULD YOU CALL HER IN SOMETHING FOR THIS. PHARM IS [facility] IN [place].

## 2017-03-16 NOTE — Telephone Encounter (Signed)
PT NOTIFIED ABOUT RX AT [place]

## 2017-03-22 DIAGNOSIS — H402213 Chronic angle-closure glaucoma, right eye, severe stage: Secondary | ICD-10-CM | POA: Diagnosis not present

## 2017-03-27 ENCOUNTER — Telehealth: Payer: Self-pay

## 2017-03-27 NOTE — Telephone Encounter (Signed)
We can try topical estrogen or referral to gyn

## 2017-03-27 NOTE — Telephone Encounter (Signed)
Patient called and states since her physical she has had vaginal itching and was seen during her physical. Dr. Ancil Boozer told her to take Monistat over the counter. Patient states Monistat has not helped and the last pill was not successful. The only thing she is experiencing is the redness and itching, it does help when she puts a warm towel on the area. Could you please call her in a cream and send me the message back so I can call the patient and know. Thanks.  Cell phone number is  [phone]

## 2017-03-28 ENCOUNTER — Other Ambulatory Visit: Payer: Self-pay | Admitting: Family Medicine

## 2017-03-28 MED ORDER — ESTROGENS, CONJUGATED 0.625 MG/GM VA CREA: 1.0000 | TOPICAL_CREAM | Freq: Every day | VAGINAL | 12 refills | Status: DC

## 2017-04-07 ENCOUNTER — Telehealth: Payer: Self-pay

## 2017-04-07 ENCOUNTER — Ambulatory Visit (INDEPENDENT_AMBULATORY_CARE_PROVIDER_SITE_OTHER): Payer: PPO

## 2017-04-07 DIAGNOSIS — E538 Deficiency of other specified B group vitamins: Secondary | ICD-10-CM

## 2017-04-07 DIAGNOSIS — N9089 Other specified noninflammatory disorders of vulva and perineum: Secondary | ICD-10-CM

## 2017-04-07 MED ORDER — CYANOCOBALAMIN 1000 MCG/ML IJ SOLN
1000.0000 ug | Freq: Once | INTRAMUSCULAR | Status: AC
  Administered 2017-04-07: 1000 ug via INTRAMUSCULAR

## 2017-04-07 NOTE — Telephone Encounter (Signed)
Pt came in today for her b12 and stated that she is still having the vaginal itching and redness and would like for something to be sent to the pharmacy for her

## 2017-04-12 ENCOUNTER — Encounter: Payer: Self-pay | Admitting: Advanced Practice Midwife

## 2017-04-12 ENCOUNTER — Ambulatory Visit (INDEPENDENT_AMBULATORY_CARE_PROVIDER_SITE_OTHER): Payer: PPO | Admitting: Advanced Practice Midwife

## 2017-04-12 VITALS — BP 148/80 | HR 103 | Ht 66.0 in | Wt 159.0 lb

## 2017-04-12 DIAGNOSIS — N9089 Other specified noninflammatory disorders of vulva and perineum: Secondary | ICD-10-CM

## 2017-04-12 NOTE — Progress Notes (Signed)
  HPI:      [name] is a 71 y.o. G1P1001 who is postmenopausal, presents today for a problem visit.  She complains of vaginal/vulvar irritation and itching.   Symptoms have been present for 1 month. Symptoms are mod to severe and has led her to come in today to seek options for intervention. She has seen her PCP who initially prescribed monistat which did not help. Her PCP then prescribed diflucan which did not help and then premarin cream which she has done for a week and a half with little relief. She has also tried cortisone cream for the itching, an old Rx of cipro that she had in case it was an infection, and vagisil which seems to have helped the most.  Previous Treatment: The patient had a total hysterectomy 21 years ago and took hormone replacement for 20 years. During that time she did not have any significant symptoms.   She is not sexually active. Denies PostMenopausal Bleeding  PMHx: She  has a past medical history of Allergy; Anxiety; Chronic insomnia; Colon adenoma; Dyslipidemia; Dysphagia; Female stress incontinence; GERD (gastroesophageal reflux disease); Glaucoma, both eyes; Hyperlipidemia; Hypertension; Hypothyroidism; Menopause syndrome; Osteoporosis; Polyp of colon; Renal insufficiency; and Vitamin D deficiency. Also,  has a past surgical history that includes Ovarian cyst removal; Colonoscopy with propofol (N/A, 05/29/2015); Esophagogastroduodenoscopy (N/A, 05/29/2015); Appendectomy; Abdominal hysterectomy; Eye surgery; and Photocoagulation with laser (Right, 11/21/2016)., family history includes COPD in her mother; Cancer in her father; Dementia in her mother; Hypertension in her mother; Osteoporosis in her mother.,  reports that she has never smoked. She has never used smokeless tobacco. She reports that she does not drink alcohol or use drugs.  She has a current medication list which includes the following prescription(s): alprazolam, aspirin, cholecalciferol, citalopram,  conjugated estrogens, fluticasone, levothyroxine, levothyroxine, loratadine, losartan, montelukast, omega-3 fatty acids, pantoprazole, pazeo, simvastatin, timolol, timolol, and zolpidem. Also, is allergic to brimonidine tartrate; latanoprost; raloxifene; sulfa antibiotics; and timolol maleate.  Review of Systems  Constitutional: Negative.   HENT: Negative.   Eyes: Negative.   Respiratory: Negative.   Cardiovascular: Negative.   Gastrointestinal: Negative.   Genitourinary: Negative.        Occasional urge incontinence  Musculoskeletal: Negative.   Skin: Negative.   Neurological: Negative.   Endo/Heme/Allergies: Negative.   Psychiatric/Behavioral: Negative.     Objective: BP (!) 148/80   Pulse (!) 103   Ht 5\' 6"  (1.676 m)   Wt 159 lb (72.1 kg)   BMI 25.66 kg/m  Physical Exam  Constitutional: She appears well-developed and well-nourished.  Genitourinary:  Genitourinary Comments: Normal vulva/vagina postmenopausal, mild redness noted at vulva, no lesions, no prolapse of pelvic organs, wet mount negative for yeast, BV.  HENT:  Head: Normocephalic and atraumatic.  Neck: Normal range of motion. Neck supple.  Cardiovascular: Normal rate and regular rhythm.   Pulmonary/Chest: Effort normal and breath sounds normal.  Abdominal: Soft. Bowel sounds are normal.    ASSESSMENT/PLAN:  Menopause with vulvar and vaginal irritation and itching  1. Sea salt bath soaks 2. Coconut oil for irritation 3. Benadryl for itching 4. Intrarosa 2 week sample pack given    Rod Can, CNM

## 2017-04-20 ENCOUNTER — Other Ambulatory Visit: Payer: Self-pay | Admitting: Family Medicine

## 2017-04-20 DIAGNOSIS — E039 Hypothyroidism, unspecified: Secondary | ICD-10-CM | POA: Diagnosis not present

## 2017-04-20 LAB — TSH: TSH: 2.46 m[IU]/L

## 2017-05-13 ENCOUNTER — Other Ambulatory Visit: Payer: Self-pay | Admitting: Family Medicine

## 2017-05-13 DIAGNOSIS — E038 Other specified hypothyroidism: Secondary | ICD-10-CM

## 2017-05-14 ENCOUNTER — Other Ambulatory Visit: Payer: Self-pay | Admitting: Family Medicine

## 2017-05-14 DIAGNOSIS — E038 Other specified hypothyroidism: Secondary | ICD-10-CM

## 2017-05-26 ENCOUNTER — Encounter: Payer: Self-pay | Admitting: Family Medicine

## 2017-05-26 ENCOUNTER — Ambulatory Visit (INDEPENDENT_AMBULATORY_CARE_PROVIDER_SITE_OTHER): Payer: PPO | Admitting: Family Medicine

## 2017-05-26 VITALS — BP 110/70 | HR 85 | Temp 98.0°F | Resp 16 | Ht 66.0 in | Wt 154.4 lb

## 2017-05-26 DIAGNOSIS — M170 Bilateral primary osteoarthritis of knee: Secondary | ICD-10-CM | POA: Diagnosis not present

## 2017-05-26 DIAGNOSIS — E038 Other specified hypothyroidism: Secondary | ICD-10-CM | POA: Diagnosis not present

## 2017-05-26 DIAGNOSIS — J3089 Other allergic rhinitis: Secondary | ICD-10-CM | POA: Diagnosis not present

## 2017-05-26 DIAGNOSIS — H9201 Otalgia, right ear: Secondary | ICD-10-CM | POA: Diagnosis not present

## 2017-05-26 DIAGNOSIS — E538 Deficiency of other specified B group vitamins: Secondary | ICD-10-CM

## 2017-05-26 DIAGNOSIS — J302 Other seasonal allergic rhinitis: Secondary | ICD-10-CM

## 2017-05-26 MED ORDER — LEVOCETIRIZINE DIHYDROCHLORIDE 5 MG PO TABS: 5.0000 mg | ORAL_TABLET | Freq: Every evening | ORAL | 2 refills | Status: DC

## 2017-05-26 MED ORDER — FLUTICASONE PROPIONATE 50 MCG/ACT NA SUSP: 2.0000 | Freq: Every day | NASAL | 2 refills | Status: DC

## 2017-05-26 MED ORDER — LEVOTHYROXINE SODIUM 50 MCG PO TABS: ORAL_TABLET | ORAL | 0 refills | Status: DC

## 2017-05-26 MED ORDER — CYANOCOBALAMIN 1000 MCG/ML IJ SOLN
1000.0000 ug | Freq: Once | INTRAMUSCULAR | Status: AC
  Administered 2017-05-26: 1000 ug via INTRAMUSCULAR

## 2017-05-26 MED ORDER — MONTELUKAST SODIUM 10 MG PO TABS: 10.0000 mg | ORAL_TABLET | Freq: Every day | ORAL | 3 refills | Status: DC | PRN

## 2017-05-26 NOTE — Progress Notes (Addendum)
Name: Dawn Fuentes   MRN: [id number]    DOB: 1946/06/29   Date:05/26/2017       Progress Note  Subjective  Chief Complaint  Chief Complaint  Patient presents with  . Ear Pain    right ear for 2 weeks  . Nasal Congestion  . Follow-up    change in thyroid medication is working for her.  . Heartburn    was told to try and stop acid refulx medication, but she could not    HPI  Otalgia: Pt notes sharp pain in RIGHT ear x2-3 weeks, has had nasal congestion (has seasonal allergies), sore throat, needing to clear throat often, and eye itching and watering, has hx glaucoma (last eye appt was 33moago, and is doing well).  Takes tylenol for ear pain and it goes away, but comes right back after medication wears off.  B-12: Pt receives B-12 shots monthly and needs this done today. Last [name] level was drawn 230moago and was 1,194. Last injection was 03/28/2017.  Knee Pain/OA: Notes feeling some "crunching" and mild to moderate pain. Has taken Tylenol with some relief.  [facility] to exercise with balance training and aerobics and strengthening. Pain only hurts when getting up and sitting down. No swelling or redness, no recent injury or prior trauma. Advised this is likely arthritis. We will refer to Ortho if pain worsens.   Hypothyroidism:  Notes has been doing much better on 5088m she has been taking this dosage since April and needs a refill. Last TSH was 04/20/17 and was at goal. Pt denies SE's of medication and denies hair/skin/nail changes, chest pain/palpitations/shortness of breath.  Patient Active Problem List   Diagnosis Date Noted  . Primary open angle glaucoma of both eyes 11/21/2016  . Chronic kidney disease (CKD), stage III (moderate) 04/06/2016  . Cystitis 10/12/2015  . Benign hypertension 08/21/2015  . Insomnia, persistent 08/21/2015  . Dyslipidemia 08/21/2015  . Gastro-esophageal reflux disease without esophagitis 08/21/2015  . Glaucoma 08/21/2015  . Blood  glucose elevated 08/21/2015  . Adult hypothyroidism 08/21/2015  . Climacteric 08/21/2015  . Senile osteoporosis 08/21/2015  . Seasonal allergies 08/21/2015  . Female genuine stress incontinence 08/21/2015  . Vitamin D deficiency 08/21/2015    Social History  Substance Use Topics  . Smoking status: Never Smoker  . Smokeless tobacco: Never Used  . Alcohol use No     Current Outpatient Prescriptions:  .  ALPRAZolam (XANAX) 0.5 MG tablet, TAKE 1 TABLET BY MOUTH DAILY AS NEEDED FOR ANXIETY. MUST LAST 90 DAYS, Disp: 40 tablet, Rfl: 0 .  aspirin 81 MG tablet, Take 81 mg by mouth daily. pm, Disp: , Rfl:  .  cholecalciferol (VITAMIN D) 1000 units tablet, Take 1,000 Units by mouth 2 (two) times daily. am, Disp: , Rfl:  .  citalopram (CELEXA) 10 MG tablet, Take 1 tablet (10 mg total) by mouth daily., Disp: 90 tablet, Rfl: 1 .  conjugated estrogens (PREMARIN) vaginal cream, Place 1 Applicatorful vaginally at bedtime. For two weeks after that use it twice weekly at night, Disp: 42.5 g, Rfl: 12 .  fluticasone (FLONASE) 50 MCG/ACT nasal spray, Place 2 sprays into both nostrils daily. Pm, Disp: 16 g, Rfl: 2 .  levothyroxine (SYNTHROID, LEVOTHROID) 50 MCG tablet, TAKE 1 TABLET BY MOUTH DAILY BEFORE BREAKFAST, Disp: 30 tablet, Rfl: 0 .  loratadine (CLARITIN) 10 MG tablet, Take 10 mg by mouth daily as needed for allergies., Disp: , Rfl:  .  losartan (COZAAR) 50 MG tablet, TAKE 1 TABLET(100 MG) BY MOUTH DAILY, Disp: 90 tablet, Rfl: 1 .  montelukast (SINGULAIR) 10 MG tablet, Take 1 tablet (10 mg total) by mouth daily as needed (allergies)., Disp: 90 tablet, Rfl: 3 .  Omega-3 Fatty Acids (FISH OIL PO), Take by mouth. AM, Disp: , Rfl:  .  pantoprazole (PROTONIX) 40 MG tablet, TAKE 1 TABLET(40 MG) BY MOUTH DAILY, Disp: 90 tablet, Rfl: 1 .  PAZEO 0.7 % SOLN, INSTILL 1 GTT IN OU QD/ pm, Disp: , Rfl: 6 .  simvastatin (ZOCOR) 40 MG tablet, Take 1 tablet (40 mg total) by mouth daily. pm, Disp: 90 tablet, Rfl: 1 .   timolol (BETIMOL) 0.25 % ophthalmic solution, 1-2 drops 2 (two) times daily. Am and pm, Disp: , Rfl:  .  timolol (TIMOPTIC) 0.5 % ophthalmic solution, INT 1 GTT IN OU BID, Disp: , Rfl: 5 .  zolpidem (AMBIEN) 10 MG tablet, TAKE 1 TABLET BY MOUTH EVERY DAY AT BEDTIME, Disp: 90 tablet, Rfl: 0  Allergies  Allergen Reactions  . Brimonidine Tartrate   . Latanoprost   . Raloxifene     bone pain  . Sulfa Antibiotics Nausea Only    Intolerance rather than allergy.  . Timolol Maleate     ROS  Constitutional: Negative for fever or weight change.  HEENT: See HPI Respiratory: Negative for cough and shortness of breath.   Cardiovascular: Negative for chest pain or palpitations.  Gastrointestinal: Negative for abdominal pain, no bowel changes.  Musculoskeletal: Negative for gait problem or joint swelling. See HPI Skin: Negative for rash.  Neurological: Negative for dizziness or headache.  No other specific complaints in a complete review of systems (except as listed in HPI above).  Objective  Vitals:   05/26/17 1031  BP: 110/70  Pulse: 85  Resp: 16  Temp: 98 F (36.7 C)  TempSrc: Oral  SpO2: 97%  Weight: 154 lb 6.4 oz (70 kg)  Height: _0  (1.676 m)    Body mass index is 24.92 kg/m.  Nursing Note and Vital Signs reviewed.  Physical Exam  Constitutional: Patient appears well-developed and well-nourished. No distress.  HEENT: head atraumatic, normocephalic, pupils equal and reactive to light, EOM's intact, Right TM has mild retraction without erythema or drainage, Left TM WNL, no maxillary or frontal sinus pain on palpation, neck supple without lymphadenopathy, oropharynx pink and moist without exudate Cardiovascular: Normal rate, regular rhythm, S1/S2 present.  No murmur or rub heard. No BLE edema. Pulmonary/Chest: Effort normal and breath sounds clear. No respiratory distress or retractions. MSK: Crepitus to bilateral knees, no pain on palpation, no erythema or swelling, no  laxity or gait steady and WNL. Psychiatric: Patient has a normal mood and affect. behavior is normal. Judgment and thought content normal.  Recent Results (from the past 2160 hour(s))  CBC with Differential/Platelet     Status: None   Collection Time: 03/08/17 10:04 AM  Result Value Ref Range   WBC 4.9 3.8 - 10.8 K/uL   RBC 4.53 3.80 - 5.10 MIL/uL   Hemoglobin 13.8 11.7 - 15.5 g/dL   HCT 41.6 35.0 - 45.0 %   MCV 91.8 80.0 - 100.0 fL   MCH 30.5 27.0 - 33.0 pg   MCHC 33.2 32.0 - 36.0 g/dL   RDW 14.1 11.0 - 15.0 %   Platelets 264 140 - 400 K/uL   MPV 10.6 7.5 - 12.5 fL   Neutro Abs 2,744 1,500 - 7,800 cells/uL   Lymphs Abs  1,274 850 - 3,900 cells/uL   Monocytes Absolute 490 200 - 950 cells/uL   Eosinophils Absolute 245 15 - 500 cells/uL   Basophils Absolute 147 0 - 200 cells/uL   Neutrophils Relative % 56 %   Lymphocytes Relative 26 %   Monocytes Relative 10 %   Eosinophils Relative 5 %   Basophils Relative 3 %   Smear Review Criteria for review not met   COMPLETE METABOLIC PANEL WITH GFR     Status: Abnormal   Collection Time: 03/08/17 10:04 AM  Result Value Ref Range   Sodium 140 135 - 146 mmol/L   Potassium 4.3 3.5 - 5.3 mmol/L   Chloride 104 98 - 110 mmol/L   CO2 30 20 - 31 mmol/L   Glucose, Bld 93 65 - 99 mg/dL   BUN 18 7 - 25 mg/dL   Creat 1.26 (H) 0.50 - 0.99 mg/dL    Comment:   For patients > or = 69 years of age: The upper reference limit for Creatinine is approximately 13% higher for people identified as African-American.      Total Bilirubin 0.5 0.2 - 1.2 mg/dL   Alkaline Phosphatase 50 33 - 130 U/L   AST 31 10 - 35 U/L   ALT 28 6 - 29 U/L   Total Protein 6.6 6.1 - 8.1 g/dL   Albumin 4.2 3.6 - 5.1 g/dL   Calcium 9.3 8.6 - 10.4 mg/dL   GFR, Est African American 51 (L) >=60 mL/min   GFR, Est Non African American 44 (L) >=60 mL/min  Hemoglobin A1c     Status: None   Collection Time: 03/08/17 10:04 AM  Result Value Ref Range   Hgb A1c MFr Bld 5.5 <5.7 %     Comment:   For the purpose of screening for the presence of diabetes:   <5.7%       Consistent with the absence of diabetes 5.7-6.4 %   Consistent with increased risk for diabetes (prediabetes) >=6.5 %     Consistent with diabetes   This assay result is consistent with a decreased risk of diabetes.   Currently, no consensus exists regarding use of hemoglobin A1c for diagnosis of diabetes in children.   According to American Diabetes Association (ADA) guidelines, hemoglobin A1c <7.0% represents optimal control in non-pregnant diabetic patients. Different metrics may apply to specific patient populations. Standards of Medical Care in Diabetes (ADA).      Mean Plasma Glucose 111 mg/dL  Insulin, fasting     Status: None   Collection Time: 03/08/17 10:04 AM  Result Value Ref Range   Insulin fasting, serum 8.3 2.0 - 19.6 uIU/mL    Comment:   This insulin assay shows strong cross-reactivity for some insulin analogs (lispro, aspart, and glargine) and much lower cross-reactivity with others (detemir, glulisine).   Stimulated Insulin reference intervals were established using the Siemens Immulite assay. These values are provided for general guidance only.   Lipid panel     Status: None   Collection Time: 03/08/17 10:04 AM  Result Value Ref Range   Cholesterol 156 <200 mg/dL   Triglycerides 73 <150 mg/dL   HDL 70 >50 mg/dL   Total CHOL/HDL Ratio 2.2 <5.0 Ratio   VLDL 15 <30 mg/dL   LDL Cholesterol 71 <100 mg/dL  TSH     Status: Abnormal   Collection Time: 03/08/17 10:04 AM  Result Value Ref Range   TSH 8.36 (H) mIU/L    Comment:   Reference Range   >  or = 20 Years  0.40-4.50   Pregnancy Range First trimester  0.26-2.66 Second trimester 0.55-2.73 Third trimester  0.43-2.91     VITAMIN D 25 Hydroxy (Vit-D Deficiency, Fractures)     Status: None   Collection Time: 03/08/17 10:04 AM  Result Value Ref Range   Vit D, 25-Hydroxy 45 30 - 100 ng/mL    Comment: Vitamin D Status            25-OH Vitamin D        Deficiency                <20 ng/mL        Insufficiency         20 - 29 ng/mL        Optimal             > or = 30 ng/mL   For 25-OH Vitamin D testing on patients on D2-supplementation and patients for whom quantitation of D2 and D3 fractions is required, the QuestAssureD 25-OH VIT D, (D2,D3), LC/MS/MS is recommended: order code [phone] (patients > 2 yrs).   Vitamin [name]     Status: Abnormal   Collection Time: 03/08/17 10:04 AM  Result Value Ref Range   Vitamin B-12 1,194 (H) 200 - 1,100 pg/mL  TSH     Status: None   Collection Time: 04/20/17 11:52 AM  Result Value Ref Range   TSH 2.46 mIU/L    Comment:   Reference Range   > or = 20 Years  0.40-4.50   Pregnancy Range First trimester  0.26-2.66 Second trimester 0.55-2.73 Third trimester  0.43-2.91        Assessment & Plan  1. Seasonal allergic rhinitis, unspecified trigger - levocetirizine (XYZAL) 5 MG tablet; Take 1 tablet (5 mg total) by mouth every evening.  Dispense: 30 tablet; Refill: 2 - montelukast (SINGULAIR) 10 MG tablet; Take 1 tablet (10 mg total) by mouth daily as needed (allergies).  Dispense: 90 tablet; Refill: 3  2. Perennial allergic rhinitis with seasonal variation - levocetirizine (XYZAL) 5 MG tablet; Take 1 tablet (5 mg total) by mouth every evening.  Dispense: 30 tablet; Refill: 2 - montelukast (SINGULAIR) 10 MG tablet; Take 1 tablet (10 mg total) by mouth daily as needed (allergies).  Dispense: 90 tablet; Refill: 3 - fluticasone (FLONASE) 50 MCG/ACT nasal spray; Place 2 sprays into both nostrils daily. Pm  Dispense: 16 g; Refill: 2  3. Primary osteoarthritis of both knees - Tylenol PRN, exercise as tolerated, ice both knees PRN - Discussed conservative measures and will consider referral to ortho if pain worsens.   4. Other specified hypothyroidism - levothyroxine (SYNTHROID, LEVOTHROID) 50 MCG tablet; TAKE 1 TABLET BY MOUTH DAILY BEFORE BREAKFAST  Dispense: 90 tablet;  Refill: 0  5. Otalgia, right ear - Advised that pain may be due to eustachian tube dysfunction secondary to rhinitis. She will monitor her symptoms over the next week and do the interventions for rhinitis and nasal congestion as above. She will call back if no improvement in 5-7 days. We will consider antibiotic therapy or ENT referral at that time. She is agreeable to the watch and wait plan.  6. [name] deficiency - cyanocobalamin ((VITAMIN B-12)) injection 1,000 mcg; Inject 1 mL (1,000 mcg total) into the muscle once.  -Red flags and when to present for emergency care or RTC including fever >101.11F, chest pain, shortness of breath, new/worsening/un-resolving symptoms, change in hearing, headache, or ear drainage reviewed with patient at time of  visit. Follow up and care instructions discussed and provided in AVS.  I have reviewed this encounter including the documentation in this note and/or discussed this patient with the Johney Maine, FNP, NP-C. I am certifying that I agree with the content of this note as supervising physician.  Steele Sizer, MD [facility] 05/28/2017, 8:27 PM

## 2017-06-09 ENCOUNTER — Telehealth: Payer: Self-pay | Admitting: Family Medicine

## 2017-06-09 ENCOUNTER — Encounter: Payer: Self-pay | Admitting: Family Medicine

## 2017-06-09 ENCOUNTER — Ambulatory Visit (INDEPENDENT_AMBULATORY_CARE_PROVIDER_SITE_OTHER): Payer: PPO | Admitting: Family Medicine

## 2017-06-09 VITALS — BP 115/76 | HR 85 | Temp 98.0°F | Resp 16 | Ht 66.0 in | Wt 160.9 lb

## 2017-06-09 DIAGNOSIS — H9201 Otalgia, right ear: Secondary | ICD-10-CM | POA: Diagnosis not present

## 2017-06-09 DIAGNOSIS — H66001 Acute suppurative otitis media without spontaneous rupture of ear drum, right ear: Secondary | ICD-10-CM

## 2017-06-09 MED ORDER — AMOXICILLIN-POT CLAVULANATE 875-125 MG PO TABS: 1.0000 | ORAL_TABLET | Freq: Two times a day (BID) | ORAL | 0 refills | Status: AC

## 2017-06-09 NOTE — Telephone Encounter (Signed)
Spoke with patient and she will be seen in office today at 2pm

## 2017-06-09 NOTE — Progress Notes (Addendum)
Name: Dawn Fuentes   MRN: [id number]    DOB: Jun 03, 1946   Date:06/09/2017       Progress Note  Subjective  Chief Complaint  Chief Complaint  Patient presents with  . Ear Pain    right    HPI  Pt presents to follow up on RIGHT intermittent ear pain that's described as "deep". She was seen on 05/26/2017 for the same, pain went away with Singulair, Xyzal, and Flonase, but returned 3 days ago.  She has been taking medications as prescribed. Some nasal congestion with drainage into her throat, some ear pressure too; no dizziness or tinnitus, has been having ear pain x3 days.  She takes Tylenol when the pain gets really bad, and the pain improves. No fevers/chills, no cough, chest pain or shortness of breath, no body aches, no ear drainage or bleeding.  Patient Active Problem List   Diagnosis Date Noted  . Osteoarthritis of knees, bilateral 05/26/2017  . Primary open angle glaucoma of both eyes 11/21/2016  . Chronic kidney disease (CKD), stage III (moderate) 04/06/2016  . Cystitis 10/12/2015  . Benign hypertension 08/21/2015  . Insomnia, persistent 08/21/2015  . Dyslipidemia 08/21/2015  . Gastro-esophageal reflux disease without esophagitis 08/21/2015  . Glaucoma 08/21/2015  . Blood glucose elevated 08/21/2015  . Adult hypothyroidism 08/21/2015  . Climacteric 08/21/2015  . Senile osteoporosis 08/21/2015  . Seasonal allergies 08/21/2015  . Female genuine stress incontinence 08/21/2015  . Vitamin D deficiency 08/21/2015    Social History  Substance Use Topics  . Smoking status: Never Smoker  . Smokeless tobacco: Never Used  . Alcohol use No     Current Outpatient Prescriptions:  .  ALPRAZolam (XANAX) 0.5 MG tablet, TAKE 1 TABLET BY MOUTH DAILY AS NEEDED FOR ANXIETY. MUST LAST 90 DAYS, Disp: 40 tablet, Rfl: 0 .  aspirin 81 MG tablet, Take 81 mg by mouth daily. pm, Disp: , Rfl:  .  cholecalciferol (VITAMIN D) 1000 units tablet, Take 1,000 Units by mouth 2 (two) times daily. am,  Disp: , Rfl:  .  citalopram (CELEXA) 10 MG tablet, Take 1 tablet (10 mg total) by mouth daily., Disp: 90 tablet, Rfl: 1 .  conjugated estrogens (PREMARIN) vaginal cream, Place 1 Applicatorful vaginally at bedtime. For two weeks after that use it twice weekly at night, Disp: 42.5 g, Rfl: 12 .  fluticasone (FLONASE) 50 MCG/ACT nasal spray, Place 2 sprays into both nostrils daily. Pm, Disp: 16 g, Rfl: 2 .  levocetirizine (XYZAL) 5 MG tablet, Take 1 tablet (5 mg total) by mouth every evening., Disp: 30 tablet, Rfl: 2 .  levothyroxine (SYNTHROID, LEVOTHROID) 50 MCG tablet, TAKE 1 TABLET BY MOUTH DAILY BEFORE BREAKFAST, Disp: 90 tablet, Rfl: 0 .  losartan (COZAAR) 50 MG tablet, TAKE 1 TABLET(100 MG) BY MOUTH DAILY, Disp: 90 tablet, Rfl: 1 .  montelukast (SINGULAIR) 10 MG tablet, Take 1 tablet (10 mg total) by mouth daily as needed (allergies)., Disp: 90 tablet, Rfl: 3 .  Omega-3 Fatty Acids (FISH OIL PO), Take by mouth. AM, Disp: , Rfl:  .  pantoprazole (PROTONIX) 40 MG tablet, TAKE 1 TABLET(40 MG) BY MOUTH DAILY, Disp: 90 tablet, Rfl: 1 .  PAZEO 0.7 % SOLN, INSTILL 1 GTT IN OU QD/ pm, Disp: , Rfl: 6 .  simvastatin (ZOCOR) 40 MG tablet, Take 1 tablet (40 mg total) by mouth daily. pm, Disp: 90 tablet, Rfl: 1 .  timolol (BETIMOL) 0.25 % ophthalmic solution, 1-2 drops 2 (two) times daily. Am and pm,  Disp: , Rfl:  .  timolol (TIMOPTIC) 0.5 % ophthalmic solution, INT 1 GTT IN OU BID, Disp: , Rfl: 5 .  zolpidem (AMBIEN) 10 MG tablet, TAKE 1 TABLET BY MOUTH EVERY DAY AT BEDTIME, Disp: 90 tablet, Rfl: 0  Allergies  Allergen Reactions  . Brimonidine Tartrate   . Latanoprost   . Raloxifene     bone pain  . Sulfa Antibiotics Nausea Only    Intolerance rather than allergy.  . Timolol Maleate     ROS  Constitutional: Negative for fever or weight change.  Respiratory: Negative for cough and shortness of breath.   HEENT: See HPI. Cardiovascular: Negative for chest pain or palpitations.  Gastrointestinal:  Negative for abdominal pain, no bowel changes.  Musculoskeletal: Negative for gait problem or joint swelling.  Skin: Negative for rash.  Neurological: Negative for dizziness or headache.  No other specific complaints in a complete review of systems (except as listed in HPI above).  Objective  Vitals:   06/09/17 1331  BP: 115/76  Pulse: 85  Resp: 16  Temp: 98 F (36.7 C)  TempSrc: Oral  SpO2: 96%  Weight: 160 lb 14.4 oz (73 kg)  Height: 5\' 6"  (1.676 m)    Body mass index is 25.97 kg/m.  Nursing Note and Vital Signs reviewed.  Physical Exam  Constitutional: Patient appears well-developed and well-nourished. No distress.  HEENT: head atraumatic, normocephalic, pupils equal and reactive to light, EOM's intact, Right TM is retracted and has surrounding mild erythema, Left TM WNL, no maxillary or frontal sinus pain on palpation, neck supple without lymphadenopathy, oropharynx pink and moist without exudate Cardiovascular: Normal rate, regular rhythm, S1/S2 present.  No murmur or rub heard. No BLE edema. Pulmonary/Chest: Effort normal and breath sounds clear. No respiratory distress or retractions. Abdominal: Soft and non-tender, bowel sounds present x4 quadrants. Psychiatric: Patient has a normal mood and affect. behavior is normal. Judgment and thought content normal.  Recent Results (from the past 2160 hour(s))  TSH     Status: None   Collection Time: 04/20/17 11:52 AM  Result Value Ref Range   TSH 2.46 mIU/L    Comment:   Reference Range   > or = 20 Years  0.40-4.50   Pregnancy Range First trimester  0.26-2.66 Second trimester 0.55-2.73 Third trimester  0.43-2.91        Assessment & Plan  1. Acute suppurative otitis media of right ear without spontaneous rupture of tympanic membrane, recurrence not specified - amoxicillin-clavulanate (AUGMENTIN) 875-125 MG tablet; Take 1 tablet by mouth 2 (two) times daily.  Dispense: 20 tablet; Refill: 0  2. Otalgia, right  ear - If pain continues after ABX treatment, we will refer to ENT.   -Red flags and when to present for emergency care or RTC including fever >101.55F, chest pain, shortness of breath, new/worsening/un-resolving symptoms, headache or significant increase in pain, blood or drainage from ear reviewed with patient at time of visit. Follow up and care instructions discussed and provided in AVS.   I have reviewed this encounter including the documentation in this note and/or discussed this patient with the Johney Maine, FNP, NP-C. I am certifying that I agree with the content of this note as supervising physician.  Steele Sizer, MD [facility] 06/09/2017, 5:39 PM

## 2017-06-09 NOTE — Telephone Encounter (Signed)
Erroneous Entry  

## 2017-06-09 NOTE — Telephone Encounter (Signed)
Patient was seen two weeks ago for ear pain.  Patient stated that she was given an allergy medicine that helped for a week but the ear pain is back.  Patient wanted to know if something could be called into the pharmacy or what should she do.  Please advise.

## 2017-06-09 NOTE — Patient Instructions (Addendum)
Please take a probiotic daily and for 7 days after finishing your antibiotic to help prevent diarrhea.  Call if not improving in 5-10 days, and we can refer you to an ENT. Otitis Media, Adult Otitis media is redness, soreness, and puffiness (swelling) in the space just behind your eardrum (middle ear). It may be caused by allergies or infection. It often happens along with a cold. Follow these instructions at home:  Take your medicine as told. Finish it even if you start to feel better.  Only take over-the-counter or prescription medicines for pain, discomfort, or fever as told by your doctor.  Follow up with your doctor as told. Contact a doctor if:  You have otitis media only in one ear, or bleeding from your nose, or both.  You notice a lump on your neck.  You are not getting better in 3-5 days.  You feel worse instead of better. Get help right away if:  You have pain that is not helped with medicine.  You have puffiness, redness, or pain around your ear.  You get a stiff neck.  You cannot move part of your face (paralysis).  You notice that the bone behind your ear hurts when you touch it. This information is not intended to replace advice given to you by your health care provider. Make sure you discuss any questions you have with your health care provider. Document Released: 05/23/2008 Document Revised: 05/12/2016 Document Reviewed: 07/02/2013 Elsevier Interactive Patient Education  2017 [employer].

## 2017-07-06 ENCOUNTER — Ambulatory Visit (INDEPENDENT_AMBULATORY_CARE_PROVIDER_SITE_OTHER): Payer: PPO

## 2017-07-06 DIAGNOSIS — D518 Other vitamin B12 deficiency anemias: Secondary | ICD-10-CM

## 2017-07-06 MED ORDER — CYANOCOBALAMIN 1000 MCG/ML IJ SOLN
1000.0000 ug | Freq: Once | INTRAMUSCULAR | Status: AC
  Administered 2017-07-06: 1000 ug via INTRAMUSCULAR

## 2017-07-13 DIAGNOSIS — N390 Urinary tract infection, site not specified: Secondary | ICD-10-CM | POA: Diagnosis not present

## 2017-07-25 DIAGNOSIS — H401132 Primary open-angle glaucoma, bilateral, moderate stage: Secondary | ICD-10-CM | POA: Diagnosis not present

## 2017-07-26 ENCOUNTER — Other Ambulatory Visit: Payer: Self-pay | Admitting: Family Medicine

## 2017-07-26 DIAGNOSIS — F411 Generalized anxiety disorder: Secondary | ICD-10-CM

## 2017-07-26 NOTE — Telephone Encounter (Signed)
Patient requesting refill of Alprazolam to [facility].

## 2017-08-10 ENCOUNTER — Ambulatory Visit (INDEPENDENT_AMBULATORY_CARE_PROVIDER_SITE_OTHER): Payer: PPO

## 2017-08-10 DIAGNOSIS — D519 Vitamin B12 deficiency anemia, unspecified: Secondary | ICD-10-CM | POA: Diagnosis not present

## 2017-08-10 MED ORDER — CYANOCOBALAMIN 1000 MCG/ML IJ SOLN
1000.0000 ug | Freq: Once | INTRAMUSCULAR | Status: AC
  Administered 2017-08-10: 1000 ug via INTRAMUSCULAR

## 2017-08-30 DIAGNOSIS — Z1231 Encounter for screening mammogram for malignant neoplasm of breast: Secondary | ICD-10-CM | POA: Diagnosis not present

## 2017-08-31 ENCOUNTER — Other Ambulatory Visit: Payer: Self-pay | Admitting: Family Medicine

## 2017-08-31 DIAGNOSIS — E038 Other specified hypothyroidism: Secondary | ICD-10-CM

## 2017-09-06 ENCOUNTER — Telehealth: Payer: Self-pay | Admitting: Family Medicine

## 2017-09-06 ENCOUNTER — Ambulatory Visit (INDEPENDENT_AMBULATORY_CARE_PROVIDER_SITE_OTHER): Payer: PPO | Admitting: Family Medicine

## 2017-09-06 ENCOUNTER — Encounter: Payer: Self-pay | Admitting: Family Medicine

## 2017-09-06 VITALS — BP 122/72 | HR 82 | Temp 98.0°F | Resp 16 | Ht 66.0 in | Wt 157.2 lb

## 2017-09-06 DIAGNOSIS — E538 Deficiency of other specified B group vitamins: Secondary | ICD-10-CM | POA: Diagnosis not present

## 2017-09-06 DIAGNOSIS — K219 Gastro-esophageal reflux disease without esophagitis: Secondary | ICD-10-CM

## 2017-09-06 DIAGNOSIS — Z23 Encounter for immunization: Secondary | ICD-10-CM | POA: Diagnosis not present

## 2017-09-06 DIAGNOSIS — M170 Bilateral primary osteoarthritis of knee: Secondary | ICD-10-CM

## 2017-09-06 DIAGNOSIS — G4700 Insomnia, unspecified: Secondary | ICD-10-CM

## 2017-09-06 DIAGNOSIS — E785 Hyperlipidemia, unspecified: Secondary | ICD-10-CM

## 2017-09-06 DIAGNOSIS — I10 Essential (primary) hypertension: Secondary | ICD-10-CM | POA: Diagnosis not present

## 2017-09-06 DIAGNOSIS — J302 Other seasonal allergic rhinitis: Secondary | ICD-10-CM

## 2017-09-06 DIAGNOSIS — J3089 Other allergic rhinitis: Secondary | ICD-10-CM

## 2017-09-06 DIAGNOSIS — F411 Generalized anxiety disorder: Secondary | ICD-10-CM | POA: Diagnosis not present

## 2017-09-06 DIAGNOSIS — E038 Other specified hypothyroidism: Secondary | ICD-10-CM

## 2017-09-06 MED ORDER — MELATONIN 3 MG PO CAPS: 1.0000 | ORAL_CAPSULE | Freq: Every day | ORAL | 0 refills | Status: DC

## 2017-09-06 MED ORDER — ZOLPIDEM TARTRATE 5 MG PO TABS: 10.0000 mg | ORAL_TABLET | Freq: Every day | ORAL | 0 refills | Status: DC

## 2017-09-06 MED ORDER — CYANOCOBALAMIN 1000 MCG/ML IJ SOLN
1000.0000 ug | Freq: Once | INTRAMUSCULAR | Status: AC
  Administered 2017-09-06: 1000 ug via INTRAMUSCULAR

## 2017-09-06 MED ORDER — LOSARTAN POTASSIUM 50 MG PO TABS: ORAL_TABLET | ORAL | 1 refills | Status: DC

## 2017-09-06 MED ORDER — PANTOPRAZOLE SODIUM 40 MG PO TBEC: DELAYED_RELEASE_TABLET | ORAL | 1 refills | Status: DC

## 2017-09-06 MED ORDER — CITALOPRAM HYDROBROMIDE 10 MG PO TABS: 10.0000 mg | ORAL_TABLET | Freq: Every day | ORAL | 1 refills | Status: DC

## 2017-09-06 MED ORDER — SIMVASTATIN 40 MG PO TABS: 40.0000 mg | ORAL_TABLET | Freq: Every day | ORAL | 1 refills | Status: DC

## 2017-09-06 MED ORDER — LEVOCETIRIZINE DIHYDROCHLORIDE 5 MG PO TABS: 5.0000 mg | ORAL_TABLET | Freq: Every evening | ORAL | 1 refills | Status: DC

## 2017-09-06 NOTE — Progress Notes (Signed)
Name: Dawn Fuentes   MRN: [id number]    DOB: 02-04-1946   Date:09/06/2017       Progress Note  Subjective  Chief Complaint  Chief Complaint  Patient presents with  . Hypothyroidism    6 month follow up  . Hyperlipidemia  . Insomnia  . Hypertension  . Gastroesophageal Reflux  . Chronic Kidney Disease    HPI   GAD: she is feeling much better on Citalopram 10 mg, she is very seldom taking alprazolam. She is also sleeping better. Nervous feeling and the need to eat in the afternoons subsided  HTN: bp at home is 120's  even without added Toprol XL, and half dose of Losartan and bp is at goal. She denies chest pain or palpitation, denies edema   CKI: we will monitor, she is avoiding taking  NSAID's . Only taking Tylenol prn   OA knee: she has noticed worsening of pain on right knee when going up and down stairs and when first standing up from sitting position, both knees but right is worse than left. No effusion or redness, and is no limiting her activity level  Insomnia: she is taking Melatonin and Ambien only prn, on half dose of Ambien, she states she does not feel knocked out and no side effects  Hypothyroidism: no palpitation, change in bowel movements or dysphagia. Taking medication daily as prescribed.  GERD: unable to wean self off PPI, she is back on it daily, no heartburn of indigestion as long as she takes medication, she is also on weight watchers and is losing weight and eating healthier which has helped.  Patient Active Problem List   Diagnosis Date Noted  . Osteoarthritis of knees, bilateral 05/26/2017  . Primary open angle glaucoma of both eyes 11/21/2016  . Chronic kidney disease (CKD), stage III (moderate) 04/06/2016  . Cystitis 10/12/2015  . Benign hypertension 08/21/2015  . Insomnia, persistent 08/21/2015  . Dyslipidemia 08/21/2015  . Gastro-esophageal reflux disease without esophagitis 08/21/2015  . Glaucoma 08/21/2015  . Blood glucose elevated  08/21/2015  . Adult hypothyroidism 08/21/2015  . Climacteric 08/21/2015  . Senile osteoporosis 08/21/2015  . Seasonal allergies 08/21/2015  . Female genuine stress incontinence 08/21/2015  . Vitamin D deficiency 08/21/2015    Past Surgical History:  Procedure Laterality Date  . ABDOMINAL HYSTERECTOMY     21 years ago  . APPENDECTOMY    . COLONOSCOPY WITH PROPOFOL N/A 05/29/2015   Procedure: COLONOSCOPY WITH PROPOFOL;  Surgeon: Hulen Luster, MD;  Location: [facility] ENDOSCOPY;  Service: Gastroenterology;  Laterality: N/A;  . ESOPHAGOGASTRODUODENOSCOPY N/A 05/29/2015   Procedure: ESOPHAGOGASTRODUODENOSCOPY (EGD);  Surgeon: Hulen Luster, MD;  Location: [facility] ENDOSCOPY;  Service: Gastroenterology;  Laterality: N/A;  . EYE SURGERY     eye lids  . OVARIAN CYST REMOVAL     needed transfusion  . PHOTOCOAGULATION WITH LASER Right 11/21/2016   Procedure: PHOTOCOAGULATION WITH LASER;  Surgeon: Ronnell Freshwater, MD;  Location: [place];  Service: Ophthalmology;  Laterality: Right;  A micropulse probe was applied to each hemilimbus with the following settings: 2059mW, 31.3% duty cycle, 90 seconds x 3 applications.     Family History  Problem Relation Age of Onset  . Hypertension Mother   . Osteoporosis Mother   . COPD Mother   . Dementia Mother   . Cancer Father        Colon    Social History   Social History  . Marital status: Widowed    Spouse  name: N/A  . Number of children: N/A  . Years of education: N/A   Occupational History  . Not on file.   Social History Main Topics  . Smoking status: Never Smoker  . Smokeless tobacco: Never Used  . Alcohol use No  . Drug use: No  . Sexual activity: Not Currently   Other Topics Concern  . Not on file   Social History Narrative  . No narrative on file     Current Outpatient Prescriptions:  .  ALPRAZolam (XANAX) 0.5 MG tablet, TAKE 1 TABLET BY MOUTH DAILY AS NEEDED FOR ANXIETY(MUST LAST 90 DAYS), Disp: 30 tablet, Rfl: 0 .   aspirin 81 MG tablet, Take 81 mg by mouth daily. pm, Disp: , Rfl:  .  cholecalciferol (VITAMIN D) 1000 units tablet, Take 1,000 Units by mouth 2 (two) times daily. am, Disp: , Rfl:  .  citalopram (CELEXA) 10 MG tablet, Take 1 tablet (10 mg total) by mouth daily., Disp: 90 tablet, Rfl: 1 .  conjugated estrogens (PREMARIN) vaginal cream, Place 1 Applicatorful vaginally at bedtime. For two weeks after that use it twice weekly at night, Disp: 42.5 g, Rfl: 12 .  fluticasone (FLONASE) 50 MCG/ACT nasal spray, Place 2 sprays into both nostrils daily. Pm, Disp: 16 g, Rfl: 2 .  levocetirizine (XYZAL) 5 MG tablet, Take 1 tablet (5 mg total) by mouth every evening., Disp: 90 tablet, Rfl: 1 .  levothyroxine (SYNTHROID, LEVOTHROID) 50 MCG tablet, TAKE 1 TABLET BY MOUTH DAILY BEFORE BREAKFAST, Disp: 90 tablet, Rfl: 0 .  losartan (COZAAR) 50 MG tablet, TAKE 1 TABLET(100 MG) BY MOUTH DAILY, Disp: 90 tablet, Rfl: 1 .  montelukast (SINGULAIR) 10 MG tablet, Take 1 tablet (10 mg total) by mouth daily as needed (allergies)., Disp: 90 tablet, Rfl: 3 .  Omega-3 Fatty Acids (FISH OIL PO), Take by mouth. AM, Disp: , Rfl:  .  pantoprazole (PROTONIX) 40 MG tablet, TAKE 1 TABLET(40 MG) BY MOUTH DAILY, Disp: 90 tablet, Rfl: 1 .  PAZEO 0.7 % SOLN, INSTILL 1 GTT IN OU QD/ pm, Disp: , Rfl: 6 .  simvastatin (ZOCOR) 40 MG tablet, Take 1 tablet (40 mg total) by mouth daily. pm, Disp: 90 tablet, Rfl: 1 .  timolol (TIMOPTIC) 0.5 % ophthalmic solution, INT 1 GTT IN OU BID, Disp: , Rfl: 5 .  zolpidem (AMBIEN) 5 MG tablet, Take 2 tablets (10 mg total) by mouth at bedtime., Disp: 90 tablet, Rfl: 0 .  Melatonin 3 MG CAPS, Take 1 capsule (3 mg total) by mouth daily., Disp: 90 capsule, Rfl: 0  Allergies  Allergen Reactions  . Brimonidine Tartrate   . Latanoprost   . Raloxifene     bone pain  . Sulfa Antibiotics Nausea Only    Intolerance rather than allergy.  . Timolol Maleate      ROS  Constitutional: Negative for fever or  weight change.  Respiratory: Negative for cough and shortness of breath.   Cardiovascular: Negative for chest pain or palpitations.  Gastrointestinal: Negative for abdominal pain, no bowel changes.  Musculoskeletal: Negative for gait problem or joint swelling.  Skin: Negative for rash.  Neurological: Negative for dizziness or headache.  No other specific complaints in a complete review of systems (except as listed in HPI above).  Objective  Vitals:   09/06/17 1046  BP: 122/72  Pulse: 82  Resp: 16  Temp: 98 F (36.7 C)  SpO2: 98%  Weight: 157 lb 4 oz (71.3 kg)  Height: 5\' 6"  (  1.676 m)    Body mass index is 25.38 kg/m.  Physical Exam  Constitutional: Patient appears well-developed and well-nourished. No distress.  HEENT: head atraumatic, normocephalic, pupils equal and reactive to light,  neck supple, throat within normal limits, no thyromegaly Cardiovascular: Normal rate, regular rhythm and normal heart sounds.  No murmur heard. No BLE edema. Pulmonary/Chest: Effort normal and breath sounds normal. No respiratory distress. Abdominal: Soft.  There is no tenderness. Muscular Skeletal: crepitus with extension of right knee, normal rom of both knees, no erythema or increase in warmth Psychiatric: Patient has a normal mood and affect. behavior is normal. Judgment and thought content normal.  PHQ2/9: Depression screen [facility] 2/9 06/09/2017 03/08/2017 10/31/2016 10/04/2016 09/20/2016  Decreased Interest 0 0 0 0 0  Down, Depressed, Hopeless 0 0 0 0 0  PHQ - 2 Score 0 0 0 0 0     Fall Risk: Fall Risk  06/09/2017 03/08/2017 10/31/2016 10/04/2016 09/20/2016  Falls in the past year? No No No No No     Assessment & Plan  1. Other specified hypothyroidism  - TSH  2. Need for influenza vaccination  - Flu vaccine HIGH DOSE PF (Fluzone High dose)  3. [name] deficiency  - cyanocobalamin ((VITAMIN B-12)) injection 1,000 mcg; Inject 1 mL (1,000 mcg total) into the muscle once.  4.  Primary osteoarthritis of both knees  Worse on right side. Discussed Tylenol otc  5. Seasonal allergic rhinitis, unspecified trigger  - levocetirizine (XYZAL) 5 MG tablet; Take 1 tablet (5 mg total) by mouth every evening.  Dispense: 90 tablet; Refill: 1  6. Insomnia, persistent  - zolpidem (AMBIEN) 5 MG tablet; Take 2 tablets (10 mg total) by mouth at bedtime.  Dispense: 90 tablet; Refill: 0  7. Gastro-esophageal reflux disease without esophagitis  - pantoprazole (PROTONIX) 40 MG tablet; TAKE 1 TABLET(40 MG) BY MOUTH DAILY  Dispense: 90 tablet; Refill: 1 Unable to wean self off  8. Dyslipidemia  - simvastatin (ZOCOR) 40 MG tablet; Take 1 tablet (40 mg total) by mouth daily. pm  Dispense: 90 tablet; Refill: 1  9. Benign hypertension  - COMPLETE METABOLIC PANEL WITH GFR - losartan (COZAAR) 50 MG tablet; TAKE 1 TABLET(100 MG) BY MOUTH DAILY  Dispense: 90 tablet; Refill: 1  10. Perennial allergic rhinitis with seasonal variation  - levocetirizine (XYZAL) 5 MG tablet; Take 1 tablet (5 mg total) by mouth every evening.  Dispense: 90 tablet; Refill: 1  11. GAD (generalized anxiety disorder)  - citalopram (CELEXA) 10 MG tablet; Take 1 tablet (10 mg total) by mouth daily.  Dispense: 90 tablet; Refill: 1

## 2017-09-06 NOTE — Telephone Encounter (Signed)
Pt requesting return call to discuss her bill. She is not understanding why she keep getting a bill. She understand the $10 due to the shot, however she does not understand why she is being charged so much the 2 times that she saw Raquel Sarna after she paid her copay.

## 2017-09-07 LAB — COMPLETE METABOLIC PANEL WITHOUT GFR
AG Ratio: 2.1 (calc) (ref 1.0–2.5)
ALT: 28 U/L (ref 6–29)
AST: 26 U/L (ref 10–35)
Albumin: 4.5 g/dL (ref 3.6–5.1)
Alkaline phosphatase (APISO): 51 U/L (ref 33–130)
BUN/Creatinine Ratio: 18 (calc) (ref 6–22)
BUN: 20 mg/dL (ref 7–25)
CO2: 26 mmol/L (ref 20–32)
Calcium: 9.4 mg/dL (ref 8.6–10.4)
Chloride: 104 mmol/L (ref 98–110)
Creat: 1.13 mg/dL — ABNORMAL HIGH (ref 0.50–0.99)
GFR, Est African American: 58 mL/min/{1.73_m2} — ABNORMAL LOW
GFR, Est Non African American: 50 mL/min/{1.73_m2} — ABNORMAL LOW
Globulin: 2.1 g/dL (ref 1.9–3.7)
Glucose, Bld: 95 mg/dL (ref 65–99)
Potassium: 4.5 mmol/L (ref 3.5–5.3)
Sodium: 138 mmol/L (ref 135–146)
Total Bilirubin: 0.5 mg/dL (ref 0.2–1.2)
Total Protein: 6.6 g/dL (ref 6.1–8.1)

## 2017-09-07 LAB — TSH: TSH: 0.76 m[IU]/L (ref 0.40–4.50)

## 2017-09-07 NOTE — Telephone Encounter (Signed)
Sent information to [employer], one of the claims specialist.  Once I here back from her I will reach back out to the patient.

## 2017-09-12 NOTE — Telephone Encounter (Signed)
Received message from one of the [place] claim specialist, Caren Griffins, and was informed that she spoke with HTA and both dos 05/26/17 and  06/09/17 was resubmitted back to be processed as PCP copay.  I communicated this information with patient on 09/12/17 @ 2:48pm

## 2017-10-11 DIAGNOSIS — M25561 Pain in right knee: Secondary | ICD-10-CM | POA: Diagnosis not present

## 2017-10-11 DIAGNOSIS — M25562 Pain in left knee: Secondary | ICD-10-CM | POA: Diagnosis not present

## 2017-10-20 ENCOUNTER — Ambulatory Visit (INDEPENDENT_AMBULATORY_CARE_PROVIDER_SITE_OTHER): Payer: PPO

## 2017-10-20 DIAGNOSIS — E538 Deficiency of other specified B group vitamins: Secondary | ICD-10-CM | POA: Diagnosis not present

## 2017-10-20 MED ORDER — CYANOCOBALAMIN 1000 MCG/ML IJ SOLN
1000.0000 ug | INTRAMUSCULAR | Status: DC
  Administered 2017-10-20 – 2019-02-27 (×9): 1000 ug via SUBCUTANEOUS

## 2017-10-20 NOTE — Progress Notes (Signed)
Patient had b-12 injection in left deltoid. She tolerated injection well. Denies having allergies to medication. She will schedule next appt.for b-12.

## 2017-10-23 DIAGNOSIS — G8929 Other chronic pain: Secondary | ICD-10-CM | POA: Diagnosis not present

## 2017-10-23 DIAGNOSIS — M25562 Pain in left knee: Secondary | ICD-10-CM | POA: Diagnosis not present

## 2017-10-23 DIAGNOSIS — M25561 Pain in right knee: Secondary | ICD-10-CM | POA: Diagnosis not present

## 2017-10-23 DIAGNOSIS — M6281 Muscle weakness (generalized): Secondary | ICD-10-CM | POA: Diagnosis not present

## 2017-10-27 DIAGNOSIS — M25561 Pain in right knee: Secondary | ICD-10-CM | POA: Diagnosis not present

## 2017-10-27 DIAGNOSIS — M25562 Pain in left knee: Secondary | ICD-10-CM | POA: Diagnosis not present

## 2017-10-27 DIAGNOSIS — M6281 Muscle weakness (generalized): Secondary | ICD-10-CM | POA: Diagnosis not present

## 2017-10-27 DIAGNOSIS — G8929 Other chronic pain: Secondary | ICD-10-CM | POA: Diagnosis not present

## 2017-11-03 DIAGNOSIS — M25562 Pain in left knee: Secondary | ICD-10-CM | POA: Diagnosis not present

## 2017-11-03 DIAGNOSIS — M6281 Muscle weakness (generalized): Secondary | ICD-10-CM | POA: Diagnosis not present

## 2017-11-03 DIAGNOSIS — G8929 Other chronic pain: Secondary | ICD-10-CM | POA: Diagnosis not present

## 2017-11-03 DIAGNOSIS — M25561 Pain in right knee: Secondary | ICD-10-CM | POA: Diagnosis not present

## 2017-11-20 ENCOUNTER — Ambulatory Visit (INDEPENDENT_AMBULATORY_CARE_PROVIDER_SITE_OTHER): Payer: PPO

## 2017-11-20 DIAGNOSIS — E538 Deficiency of other specified B group vitamins: Secondary | ICD-10-CM

## 2017-11-20 NOTE — Progress Notes (Signed)
Injection tolerated well by patient. NKDA to medication.

## 2017-11-27 ENCOUNTER — Other Ambulatory Visit: Payer: Self-pay | Admitting: Family Medicine

## 2017-11-27 DIAGNOSIS — E038 Other specified hypothyroidism: Secondary | ICD-10-CM

## 2017-12-22 DIAGNOSIS — H401132 Primary open-angle glaucoma, bilateral, moderate stage: Secondary | ICD-10-CM | POA: Diagnosis not present

## 2017-12-29 DIAGNOSIS — H401132 Primary open-angle glaucoma, bilateral, moderate stage: Secondary | ICD-10-CM | POA: Diagnosis not present

## 2018-01-15 ENCOUNTER — Ambulatory Visit (INDEPENDENT_AMBULATORY_CARE_PROVIDER_SITE_OTHER): Payer: PPO

## 2018-01-15 DIAGNOSIS — E538 Deficiency of other specified B group vitamins: Secondary | ICD-10-CM

## 2018-01-15 NOTE — Progress Notes (Signed)
Patient came in for her B-12 injection. She tolerated it well. NKDA.   

## 2018-01-22 ENCOUNTER — Other Ambulatory Visit: Payer: Self-pay | Admitting: Family Medicine

## 2018-01-22 DIAGNOSIS — F411 Generalized anxiety disorder: Secondary | ICD-10-CM

## 2018-02-09 ENCOUNTER — Encounter: Payer: Self-pay | Admitting: Family Medicine

## 2018-02-09 ENCOUNTER — Ambulatory Visit (INDEPENDENT_AMBULATORY_CARE_PROVIDER_SITE_OTHER): Payer: PPO | Admitting: Family Medicine

## 2018-02-09 VITALS — BP 128/86 | HR 115 | Temp 99.9°F | Resp 16 | Ht 66.0 in | Wt 150.1 lb

## 2018-02-09 DIAGNOSIS — R6889 Other general symptoms and signs: Secondary | ICD-10-CM

## 2018-02-09 DIAGNOSIS — R8290 Unspecified abnormal findings in urine: Secondary | ICD-10-CM

## 2018-02-09 LAB — POCT URINALYSIS DIPSTICK
Bilirubin, UA: NEGATIVE
Blood, UA: NEGATIVE
Glucose, UA: NEGATIVE
Ketones, UA: NEGATIVE
Leukocytes, UA: NEGATIVE
Nitrite, UA: NEGATIVE
Protein, UA: NEGATIVE
Spec Grav, UA: 1.01
Urobilinogen, UA: NEGATIVE U/dL — AB
pH, UA: 5

## 2018-02-09 LAB — POCT INFLUENZA A/B
Influenza A, POC: NEGATIVE
Influenza B, POC: NEGATIVE

## 2018-02-09 MED ORDER — BENZONATATE 100 MG PO CAPS: 100.0000 mg | ORAL_CAPSULE | Freq: Three times a day (TID) | ORAL | 0 refills | Status: DC | PRN

## 2018-02-09 MED ORDER — GUAIFENESIN ER 600 MG PO TB12: 600.0000 mg | ORAL_TABLET | Freq: Two times a day (BID) | ORAL | 0 refills | Status: DC | PRN

## 2018-02-09 MED ORDER — OSELTAMIVIR PHOSPHATE 75 MG PO CAPS: 75.0000 mg | ORAL_CAPSULE | Freq: Two times a day (BID) | ORAL | 0 refills | Status: AC

## 2018-02-09 NOTE — Progress Notes (Signed)
Name: Dawn Fuentes   MRN: [id number]    DOB: 10-21-1946   Date:02/09/2018       Progress Note  Subjective  Chief Complaint  Chief Complaint  Patient presents with  . Influenza    possible flu, fever, bodyaches, cough, chills, scratchy throat started this morning  . Urinary Tract Infection    HPI  Flu-Like illness:  She notes she felt fine last night, woke up this morning with fever (101.83F at home), congested cough, chills, body aches, sore/scratchy throat.  She notes softer stools today but no diarrhea. Rapid influenza is negative today in office.  She denies any known sick contacts recently.  Denies chest pain or shortness of breath.  UTI: Last week she felt like she had a UTI - dark and cloudy foul-smelling urine with dysuria.  She took 5 days of Cipro and felt much better afterwards.  UA is clear today.  Denies dysuria, NVD, abdominal/flank/back pain, or hematuria.  Patient Active Problem List   Diagnosis Date Noted  . Osteoarthritis of knees, bilateral 05/26/2017  . Primary open angle glaucoma of both eyes 11/21/2016  . Chronic kidney disease (CKD), stage III (moderate) ([place]) 04/06/2016  . Cystitis 10/12/2015  . Benign hypertension 08/21/2015  . Insomnia, persistent 08/21/2015  . Dyslipidemia 08/21/2015  . Gastro-esophageal reflux disease without esophagitis 08/21/2015  . Glaucoma 08/21/2015  . Blood glucose elevated 08/21/2015  . Adult hypothyroidism 08/21/2015  . Climacteric 08/21/2015  . Senile osteoporosis 08/21/2015  . Seasonal allergies 08/21/2015  . Female genuine stress incontinence 08/21/2015  . Vitamin D deficiency 08/21/2015    Social History   Tobacco Use  . Smoking status: Never Smoker  . Smokeless tobacco: Never Used  Substance Use Topics  . Alcohol use: No    Alcohol/week: 0.0 oz     Current Outpatient Medications:  .  ALPRAZolam (XANAX) 0.5 MG tablet, TAKE 1 TABLET BY MOUTH DAILY AS NEEDED FOR ANXIETY. MUST LAST 90 DAYS, Disp: 30 tablet, Rfl:  0 .  aspirin 81 MG tablet, Take 81 mg by mouth daily. pm, Disp: , Rfl:  .  cholecalciferol (VITAMIN D) 1000 units tablet, Take 1,000 Units by mouth 2 (two) times daily. am, Disp: , Rfl:  .  citalopram (CELEXA) 10 MG tablet, Take 1 tablet (10 mg total) by mouth daily., Disp: 90 tablet, Rfl: 1 .  conjugated estrogens (PREMARIN) vaginal cream, Place 1 Applicatorful vaginally at bedtime. For two weeks after that use it twice weekly at night, Disp: 42.5 g, Rfl: 12 .  fluticasone (FLONASE) 50 MCG/ACT nasal spray, Place 2 sprays into both nostrils daily. Pm, Disp: 16 g, Rfl: 2 .  levocetirizine (XYZAL) 5 MG tablet, Take 1 tablet (5 mg total) by mouth every evening., Disp: 90 tablet, Rfl: 1 .  levothyroxine (SYNTHROID, LEVOTHROID) 50 MCG tablet, TAKE 1 TABLET BY MOUTH DAILY BEFORE BREAKFAST, Disp: 90 tablet, Rfl: 0 .  losartan (COZAAR) 50 MG tablet, TAKE 1 TABLET(100 MG) BY MOUTH DAILY, Disp: 90 tablet, Rfl: 1 .  Melatonin 3 MG CAPS, Take 1 capsule (3 mg total) by mouth daily., Disp: 90 capsule, Rfl: 0 .  montelukast (SINGULAIR) 10 MG tablet, Take 1 tablet (10 mg total) by mouth daily as needed (allergies)., Disp: 90 tablet, Rfl: 3 .  Omega-3 Fatty Acids (FISH OIL PO), Take by mouth. AM, Disp: , Rfl:  .  pantoprazole (PROTONIX) 40 MG tablet, TAKE 1 TABLET(40 MG) BY MOUTH DAILY, Disp: 90 tablet, Rfl: 1 .  PAZEO 0.7 % SOLN, INSTILL  1 GTT IN OU QD/ pm, Disp: , Rfl: 6 .  simvastatin (ZOCOR) 40 MG tablet, Take 1 tablet (40 mg total) by mouth daily. pm, Disp: 90 tablet, Rfl: 1 .  timolol (TIMOPTIC) 0.5 % ophthalmic solution, INT 1 GTT IN OU BID, Disp: , Rfl: 5 .  zolpidem (AMBIEN) 5 MG tablet, Take 2 tablets (10 mg total) by mouth at bedtime., Disp: 90 tablet, Rfl: 0 .  benzonatate (TESSALON) 100 MG capsule, Take 1 capsule (100 mg total) by mouth 3 (three) times daily as needed for cough., Disp: 30 capsule, Rfl: 0 .  guaiFENesin (MUCINEX) 600 MG 12 hr tablet, Take 1 tablet (600 mg total) by mouth 2 (two) times  daily as needed for cough or to loosen phlegm., Disp: 20 tablet, Rfl: 0 .  oseltamivir (TAMIFLU) 75 MG capsule, Take 1 capsule (75 mg total) by mouth 2 (two) times daily for 5 days. With meals, Disp: 10 capsule, Rfl: 0  Current Facility-Administered Medications:  .  cyanocobalamin ((VITAMIN B-12)) injection 1,000 mcg, 1,000 mcg, Subcutaneous, Q30 days, Steele Sizer, MD, 1,000 mcg at 01/15/18 1454  Allergies  Allergen Reactions  . Brimonidine Tartrate   . Latanoprost   . Raloxifene     bone pain  . Sulfa Antibiotics Nausea Only    Intolerance rather than allergy.  . Timolol Maleate     ROS  Constitutional: Negative for fever or weight change.  Respiratory: Negative for cough and shortness of breath.   Cardiovascular: Negative for chest pain or palpitations.  Gastrointestinal: Negative for abdominal pain, no bowel changes.  Musculoskeletal: Negative for gait problem or joint swelling.  Skin: Negative for rash.  Neurological: Negative for dizziness or headache.  No other specific complaints in a complete review of systems (except as listed in HPI above).  Objective  Vitals:   02/09/18 1504  BP: 128/86  Pulse: (!) 115  Resp: 16  Temp: 99.9 F (37.7 C)  TempSrc: Oral  SpO2: 96%  Weight: 150 lb 1.6 oz (68.1 kg)  Height: 5\' 6"  (1.676 m)   Body mass index is 24.23 kg/m.  Nursing Note and Vital Signs reviewed.  Physical Exam Constitutional: Patient appears well-developed and well-nourished.  No distress.  HEENT: head atraumatic, normocephalic, pupils equal and reactive to light, EOM's intact, TM's without erythema or bulging, no maxillary or frontal sinus tenderness, neck supple without lymphadenopathy, oropharynx pink and moist without exudate Cardiovascular: Normal rate, regular rhythm, S1/S2 present.  No murmur or rub heard. No BLE edema. Pulmonary/Chest: Effort normal and breath sounds clear. No respiratory distress or retractions. Congested cough present during  examination. Abdominal: Soft and non-tender, bowel sounds present x4 quadrants.  No CVA Tenderness  Psychiatric: Patient has a normal mood and affect. behavior is normal. Judgment and thought content normal.  Results for orders placed or performed in visit on 02/09/18 (from the past 72 hour(s))  POCT Influenza A/B     Status: None   Collection Time: 02/09/18  3:18 PM  Result Value Ref Range   Influenza A, POC Negative Negative   Influenza B, POC Negative Negative  POCT urinalysis dipstick     Status: Abnormal   Collection Time: 02/09/18  3:21 PM  Result Value Ref Range   Color, UA yellow    Clarity, UA clear    Glucose, UA negative    Bilirubin, UA negative    Ketones, UA negative    Spec Grav, UA 1.010 1.010 - 1.025   Blood, UA negative  pH, UA 5.0 5.0 - 8.0   Protein, UA negative    Urobilinogen, UA negative (A) 0.2 or 1.0 E.U./dL   Nitrite, UA negative    Leukocytes, UA Negative Negative   Appearance clear    Odor none     Assessment & Plan  1. Flu-like symptoms - POCT Influenza A/B - guaiFENesin (MUCINEX) 600 MG 12 hr tablet; Take 1 tablet (600 mg total) by mouth 2 (two) times daily as needed for cough or to loosen phlegm.  Dispense: 20 tablet; Refill: 0 - oseltamivir (TAMIFLU) 75 MG capsule; Take 1 capsule (75 mg total) by mouth 2 (two) times daily for 5 days. With meals  Dispense: 10 capsule; Refill: 0 - benzonatate (TESSALON) 100 MG capsule; Take 1 capsule (100 mg total) by mouth 3 (three) times daily as needed for cough.  Dispense: 30 capsule; Refill: 0  2. Foul smelling urine - POCT urinalysis dipstick - UA is clear today - advised she likely treated any infection herself, recommend against this in the future.  She verbalizes understanding.  -Red flags and when to present for emergency care or RTC including fever >101.28F, chest pain, shortness of breath, new/worsening/un-resolving symptoms, reviewed with patient at time of visit. Follow up and care instructions  discussed and provided in AVS.

## 2018-02-09 NOTE — Patient Instructions (Addendum)
Do not take more than 4000mg  of Acetaminophen (Tylenol) in 24 hours.  Cool Mist Vaporizer A cool mist vaporizer is a device that releases a cool mist into the air. If you have a cough or a cold, using a vaporizer may help relieve your symptoms. The mist adds moisture to the air, which may help thin your mucus and make it less sticky. When your mucus is thin and less sticky, it easier for you to breathe and to cough up secretions. Do not use a vaporizer if you are allergic to mold. Follow these instructions at home:  Follow the instructions that come with the vaporizer.  Do not use anything other than distilled water in the vaporizer.  Do not run the vaporizer all of the time. Doing that can cause mold or bacteria to grow in the vaporizer.  Clean the vaporizer after each time that you use it.  Clean and dry the vaporizer well before storing it.  Stop using the vaporizer if your breathing symptoms get worse. This information is not intended to replace advice given to you by your health care provider. Make sure you discuss any questions you have with your health care provider. Document Released: 09/01/2004 Document Revised: 06/24/2016 Document Reviewed: 03/05/2016 Elsevier Interactive Patient Education  2018 [employer].   Influenza, Adult Influenza ("the flu") is an infection in the lungs, nose, and throat (respiratory tract). It is caused by a virus. The flu causes many common cold symptoms, as well as a high fever and body aches. It can make you feel very sick. The flu spreads easily from person to person (is contagious). Getting a flu shot (influenza vaccination) every year is the best way to prevent the flu. Follow these instructions at home:  Take over-the-counter and prescription medicines only as told by your doctor.  Use a cool mist humidifier to add moisture (humidity) to the air in your home. This can make it easier to breathe.  Rest as needed.  Drink enough fluid to keep  your pee (urine) clear or pale yellow.  Cover your mouth and nose when you cough or sneeze.  Wash your hands with soap and water often, especially after you cough or sneeze. If you cannot use soap and water, use hand sanitizer.  Stay home from work or school as told by your doctor. Unless you are visiting your doctor, try to avoid leaving home until your fever has been gone for 24 hours without the use of medicine.  Keep all follow-up visits as told by your doctor. This is important. How is this prevented?  Getting a yearly (annual) flu shot is the best way to avoid getting the flu. You may get the flu shot in late summer, fall, or winter. Ask your doctor when you should get your flu shot.  Wash your hands often or use hand sanitizer often.  Avoid contact with people who are sick during cold and flu season.  Eat healthy foods.  Drink plenty of fluids.  Get enough sleep.  Exercise regularly. Contact a doctor if:  You get new symptoms.  You have: ? Chest pain. ? Watery poop (diarrhea). ? A fever.  Your cough gets worse.  You start to have more mucus.  You feel sick to your stomach (nauseous).  You throw up (vomit). Get help right away if:  You start to be short of breath or have trouble breathing.  Your skin or nails turn a bluish color.  You have very bad pain or stiffness  in your neck.  You get a sudden headache.  You get sudden pain in your face or ear.  You cannot stop throwing up. This information is not intended to replace advice given to you by your health care provider. Make sure you discuss any questions you have with your health care provider. Document Released: 09/13/2008 Document Revised: 05/12/2016 Document Reviewed: 09/29/2015 Elsevier Interactive Patient Education  2017 [employer].

## 2018-02-26 ENCOUNTER — Other Ambulatory Visit: Payer: Self-pay | Admitting: Family Medicine

## 2018-02-26 DIAGNOSIS — E038 Other specified hypothyroidism: Secondary | ICD-10-CM

## 2018-02-26 NOTE — Telephone Encounter (Signed)
Refill request for thyroid medication. Levothyroxine to [facility].   Last Physical: 09/06/2017   Lab Results  Component Value Date   TSH 0.76 09/06/2017     Follow up on 03/13/2018

## 2018-03-05 ENCOUNTER — Other Ambulatory Visit: Payer: Self-pay | Admitting: Family Medicine

## 2018-03-05 DIAGNOSIS — G4700 Insomnia, unspecified: Secondary | ICD-10-CM

## 2018-03-05 NOTE — Telephone Encounter (Signed)
Refill request for general medication: Ambien 10 mg   Last office visit: 09/06/2017  Last physical exam: 03/08/2017  Follow-up on file. 03/13/2018

## 2018-03-12 ENCOUNTER — Telehealth: Payer: Self-pay

## 2018-03-12 NOTE — Telephone Encounter (Signed)
Called pt to confirm AWV w/ [facility] on 03/13/18.

## 2018-03-13 ENCOUNTER — Encounter: Payer: Self-pay | Admitting: Family Medicine

## 2018-03-13 ENCOUNTER — Ambulatory Visit (INDEPENDENT_AMBULATORY_CARE_PROVIDER_SITE_OTHER): Payer: PPO | Admitting: Family Medicine

## 2018-03-13 ENCOUNTER — Ambulatory Visit (INDEPENDENT_AMBULATORY_CARE_PROVIDER_SITE_OTHER): Payer: PPO

## 2018-03-13 VITALS — BP 124/72 | HR 66 | Temp 97.0°F | Resp 12 | Ht 66.0 in | Wt 151.9 lb

## 2018-03-13 DIAGNOSIS — E538 Deficiency of other specified B group vitamins: Secondary | ICD-10-CM

## 2018-03-13 DIAGNOSIS — J3089 Other allergic rhinitis: Secondary | ICD-10-CM

## 2018-03-13 DIAGNOSIS — G4700 Insomnia, unspecified: Secondary | ICD-10-CM | POA: Diagnosis not present

## 2018-03-13 DIAGNOSIS — J302 Other seasonal allergic rhinitis: Secondary | ICD-10-CM

## 2018-03-13 DIAGNOSIS — M810 Age-related osteoporosis without current pathological fracture: Secondary | ICD-10-CM | POA: Diagnosis not present

## 2018-03-13 DIAGNOSIS — R059 Cough, unspecified: Secondary | ICD-10-CM

## 2018-03-13 DIAGNOSIS — E785 Hyperlipidemia, unspecified: Secondary | ICD-10-CM

## 2018-03-13 DIAGNOSIS — Z1231 Encounter for screening mammogram for malignant neoplasm of breast: Secondary | ICD-10-CM | POA: Diagnosis not present

## 2018-03-13 DIAGNOSIS — R05 Cough: Secondary | ICD-10-CM

## 2018-03-13 DIAGNOSIS — I10 Essential (primary) hypertension: Secondary | ICD-10-CM

## 2018-03-13 DIAGNOSIS — K219 Gastro-esophageal reflux disease without esophagitis: Secondary | ICD-10-CM | POA: Diagnosis not present

## 2018-03-13 DIAGNOSIS — E039 Hypothyroidism, unspecified: Secondary | ICD-10-CM

## 2018-03-13 DIAGNOSIS — F411 Generalized anxiety disorder: Secondary | ICD-10-CM | POA: Diagnosis not present

## 2018-03-13 DIAGNOSIS — E559 Vitamin D deficiency, unspecified: Secondary | ICD-10-CM

## 2018-03-13 DIAGNOSIS — Z0000 Encounter for general adult medical examination without abnormal findings: Secondary | ICD-10-CM | POA: Diagnosis not present

## 2018-03-13 DIAGNOSIS — N390 Urinary tract infection, site not specified: Secondary | ICD-10-CM

## 2018-03-13 MED ORDER — CIPROFLOXACIN HCL 250 MG PO TABS: 250.0000 mg | ORAL_TABLET | Freq: Two times a day (BID) | ORAL | 1 refills | Status: DC

## 2018-03-13 MED ORDER — SIMVASTATIN 40 MG PO TABS: 40.0000 mg | ORAL_TABLET | Freq: Every day | ORAL | 1 refills | Status: DC

## 2018-03-13 MED ORDER — LOSARTAN POTASSIUM 50 MG PO TABS: ORAL_TABLET | ORAL | 1 refills | Status: DC

## 2018-03-13 MED ORDER — MONTELUKAST SODIUM 10 MG PO TABS: 10.0000 mg | ORAL_TABLET | Freq: Every day | ORAL | 1 refills | Status: DC | PRN

## 2018-03-13 MED ORDER — PANTOPRAZOLE SODIUM 40 MG PO TBEC: DELAYED_RELEASE_TABLET | ORAL | 1 refills | Status: DC

## 2018-03-13 MED ORDER — CITALOPRAM HYDROBROMIDE 10 MG PO TABS: 10.0000 mg | ORAL_TABLET | Freq: Every day | ORAL | 1 refills | Status: DC

## 2018-03-13 MED ORDER — ZOLPIDEM TARTRATE 5 MG PO TABS: 10.0000 mg | ORAL_TABLET | Freq: Every day | ORAL | 0 refills | Status: DC

## 2018-03-13 MED ORDER — LEVOCETIRIZINE DIHYDROCHLORIDE 5 MG PO TABS: 5.0000 mg | ORAL_TABLET | Freq: Every evening | ORAL | 1 refills | Status: DC

## 2018-03-13 NOTE — Patient Instructions (Signed)
[name] , Thank you for taking time to come for your Medicare Wellness Visit. I appreciate your ongoing commitment to your health goals. Please review the following plan we discussed and let me know if I can assist you in the future.   Screening recommendations/referrals: Colorectal Screening: Completed 05/29/15. Repeat every 5 years. Mammogram: Completed 05/17/17. Repeat every year.  Bone Density: Completed 02/18/14. Osteoporotic screenings no longer required. Lung Cancer Screening: You do not qualify for this screening Hepatitis C Screening: Completed 02/14/13  Vision and Dental Exams: Recommended annual ophthalmology exams for early detection of glaucoma and other disorders of the eye Recommended annual dental exams for proper oral hygiene  Vaccinations: Influenza vaccine: Up to date Pneumococcal vaccine: Completed series Tdap vaccine: Up to date Shingles vaccine: Up to date. Please call your insurance company to determine your out of pocket expense for the Shingrix vaccine. You may also receive this vaccine at your local pharmacy or Health Dept.   Advanced directives: Please bring a copy of your POA (Power of Attorney) and/or Living Will to your next appointment.  Conditions/risks identified: Recommend to drink at least 6-8 8oz glasses of water per day.  Next appointment: Please schedule your Annual Wellness Visit with your Nurse Health Advisor in one year.  Preventive Care 70 Years and Older, Female Preventive care refers to lifestyle choices and visits with your health care provider that can promote health and wellness. What does preventive care include?  A yearly physical exam. This is also called an annual well check.  Dental exams once or twice a year.  Routine eye exams. Ask your health care provider how often you should have your eyes checked.  Personal lifestyle choices, including:  Daily care of your teeth and gums.  Regular physical activity.  Eating a healthy  diet.  Avoiding tobacco and drug use.  Limiting alcohol use.  Practicing safe sex.  Taking low-dose aspirin every day.  Taking vitamin and mineral supplements as recommended by your health care provider. What happens during an annual well check? The services and screenings done by your health care provider during your annual well check will depend on your age, overall health, lifestyle risk factors, and family history of disease. Counseling  Your health care provider may ask you questions about your:  Alcohol use.  Tobacco use.  Drug use.  Emotional well-being.  Home and relationship well-being.  Sexual activity.  Eating habits.  History of falls.  Memory and ability to understand (cognition).  Work and work Statistician.  Reproductive health. Screening  You may have the following tests or measurements:  Height, weight, and BMI.  Blood pressure.  Lipid and cholesterol levels. These may be checked every 5 years, or more frequently if you are over 48 years old.  Skin check.  Lung cancer screening. You may have this screening every year starting at age 72 if you have a 30-pack-year history of smoking and currently smoke or have quit within the past 15 years.  Fecal occult blood test (FOBT) of the stool. You may have this test every year starting at age 34.  Flexible sigmoidoscopy or colonoscopy. You may have a sigmoidoscopy every 5 years or a colonoscopy every 10 years starting at age 63.  Hepatitis C blood test.  Hepatitis B blood test.  Sexually transmitted disease (STD) testing.  Diabetes screening. This is done by checking your blood sugar (glucose) after you have not eaten for a while (fasting). You may have this done every 1-3 years.  Bone density scan. This is done to screen for osteoporosis. You may have this done starting at age 49.  Mammogram. This may be done every 1-2 years. Talk to your health care provider about how often you should have  regular mammograms. Talk with your health care provider about your test results, treatment options, and if necessary, the need for more tests. Vaccines  Your health care provider may recommend certain vaccines, such as:  Influenza vaccine. This is recommended every year.  Tetanus, diphtheria, and acellular pertussis (Tdap, Td) vaccine. You may need a Td booster every 10 years.  Zoster vaccine. You may need this after age 27.  Pneumococcal 13-valent conjugate (PCV13) vaccine. One dose is recommended after age 15.  Pneumococcal polysaccharide (PPSV23) vaccine. One dose is recommended after age 74. Talk to your health care provider about which screenings and vaccines you need and how often you need them. This information is not intended to replace advice given to you by your health care provider. Make sure you discuss any questions you have with your health care provider. Document Released: 01/01/2016 Document Revised: 08/24/2016 Document Reviewed: 10/06/2015 Elsevier Interactive Patient Education  2017 [place] Prevention in the Home Falls can cause injuries. They can happen to people of all ages. There are many things you can do to make your home safe and to help prevent falls. What can I do on the outside of my home?  Regularly fix the edges of walkways and driveways and fix any cracks.  Remove anything that might make you trip as you walk through a door, such as a raised step or threshold.  Trim any bushes or trees on the path to your home.  Use bright outdoor lighting.  Clear any walking paths of anything that might make someone trip, such as rocks or tools.  Regularly check to see if handrails are loose or broken. Make sure that both sides of any steps have handrails.  Any raised decks and porches should have guardrails on the edges.  Have any leaves, snow, or ice cleared regularly.  Use sand or salt on walking paths during winter.  Clean up any spills in your  garage right away. This includes oil or grease spills. What can I do in the bathroom?  Use night lights.  Install grab bars by the toilet and in the tub and shower. Do not use towel bars as grab bars.  Use non-skid mats or decals in the tub or shower.  If you need to sit down in the shower, use a plastic, non-slip stool.  Keep the floor dry. Clean up any water that spills on the floor as soon as it happens.  Remove soap buildup in the tub or shower regularly.  Attach bath mats securely with double-sided non-slip rug tape.  Do not have throw rugs and other things on the floor that can make you trip. What can I do in the bedroom?  Use night lights.  Make sure that you have a light by your bed that is easy to reach.  Do not use any sheets or blankets that are too big for your bed. They should not hang down onto the floor.  Have a firm chair that has side arms. You can use this for support while you get dressed.  Do not have throw rugs and other things on the floor that can make you trip. What can I do in the kitchen?  Clean up any spills right away.  Avoid walking  on wet floors.  Keep items that you use a lot in easy-to-reach places.  If you need to reach something above you, use a strong step stool that has a grab bar.  Keep electrical cords out of the way.  Do not use floor polish or wax that makes floors slippery. If you must use wax, use non-skid floor wax.  Do not have throw rugs and other things on the floor that can make you trip. What can I do with my stairs?  Do not leave any items on the stairs.  Make sure that there are handrails on both sides of the stairs and use them. Fix handrails that are broken or loose. Make sure that handrails are as long as the stairways.  Check any carpeting to make sure that it is firmly attached to the stairs. Fix any carpet that is loose or worn.  Avoid having throw rugs at the top or bottom of the stairs. If you do have throw  rugs, attach them to the floor with carpet tape.  Make sure that you have a light switch at the top of the stairs and the bottom of the stairs. If you do not have them, ask someone to add them for you. What else can I do to help prevent falls?  Wear shoes that:  Do not have high heels.  Have rubber bottoms.  Are comfortable and fit you well.  Are closed at the toe. Do not wear sandals.  If you use a stepladder:  Make sure that it is fully opened. Do not climb a closed stepladder.  Make sure that both sides of the stepladder are locked into place.  Ask someone to hold it for you, if possible.  Clearly mark and make sure that you can see:  Any grab bars or handrails.  First and last steps.  Where the edge of each step is.  Use tools that help you move around (mobility aids) if they are needed. These include:  Canes.  Walkers.  Scooters.  Crutches.  Turn on the lights when you go into a dark area. Replace any light bulbs as soon as they burn out.  Set up your furniture so you have a clear path. Avoid moving your furniture around.  If any of your floors are uneven, fix them.  If there are any pets around you, be aware of where they are.  Review your medicines with your doctor. Some medicines can make you feel dizzy. This can increase your chance of falling. Ask your doctor what other things that you can do to help prevent falls. This information is not intended to replace advice given to you by your health care provider. Make sure you discuss any questions you have with your health care provider. Document Released: 10/01/2009 Document Revised: 05/12/2016 Document Reviewed: 01/09/2015 Elsevier Interactive Patient Education  2017 [employer].

## 2018-03-13 NOTE — Progress Notes (Addendum)
Subjective:   Dawn Fuentes is a 72 y.o. female who presents for Medicare Annual (Subsequent) preventive examination.  Review of Systems:  N/A Cardiac Risk Factors include: advanced age (>74men, >59 women);dyslipidemia;hypertension     Objective:     Vitals: BP 124/72 (BP Location: Left Arm, Patient Position: Sitting, Cuff Size: Normal)   Pulse 66   Temp (!) 97 F (36.1 C) (Oral)   Resp 12   Ht 5\' 6"  (1.676 m)   Wt 151 lb 14.4 oz (68.9 kg)   SpO2 97%   BMI 24.52 kg/m   Body mass index is 24.52 kg/m.  Advanced Directives 03/13/2018 09/06/2017 06/09/2017 05/26/2017 03/08/2017 11/21/2016 10/31/2016  Does Patient Have a Medical Advance Directive? Yes Yes Yes No Yes Yes No  Type of Paramedic of [place];Living will [place];Living will [place];Living will - Living will [place];Living will Living will  Does patient want to make changes to medical advance directive? Yes (MAU/Ambulatory/Procedural Areas - Information given) - - - - No - Patient declined -  Copy of [place] in Chart? No - copy requested No - copy requested - - - No - copy requested No - copy requested  Would patient like information on creating a medical advance directive? - - - - - - No - patient declined information    Tobacco Social History   Tobacco Use  Smoking Status Never Smoker  Smokeless Tobacco Never Used  Tobacco Comment   smoking cessation materials not required     Counseling given: No Comment: smoking cessation materials not required   Clinical Intake:  Pre-visit preparation completed: Yes  Pain : No/denies pain Pain Score: 0-No pain   BMI - recorded: 24.52 Nutritional Status: BMI of 19-24  Normal Nutrition Risk Assessment: Has the patient had any N/V/D within the last 2 months?  No Does the patient have any non-healing wounds?  No Has the patient had any unintentional weight loss or  weight gain?  No  Is the patient diabetic?  No If diabetic, was a CBG obtained today?  No Did the patient bring in their glucometer from home?  N/A Comments: N/A  How often do you need to have someone help you when you read instructions, pamphlets, or other written materials from your doctor or pharmacy?: 1 - Never  Interpreter Needed?: No  Information entered by :: AEversole, LPN  Hospitalizations, surgeries, and ER visits occurring within the previous 12 months:  Within the previous 12 months, pt has not underwent any surgical procedures, has not been hospitalized for any conditions and has not been treated by an emergency room clinician.  Past Medical History:  Diagnosis Date  . Allergy   . Anxiety    ER visit in Sept 2017 for anxiety  . Arthritis   . Chronic insomnia   . Colon adenoma   . Dyslipidemia   . Dysphagia   . Female stress incontinence   . GERD (gastroesophageal reflux disease)   . Glaucoma, both eyes    [facility]  . Hyperlipidemia   . Hypertension    controlled on meds  . Hypothyroidism   . Menopause syndrome   . Osteoporosis   . Polyp of colon   . Renal insufficiency    mild- keeping an eye on  . Vitamin D deficiency    Past Surgical History:  Procedure Laterality Date  . ABDOMINAL HYSTERECTOMY  21 years ago  . APPENDECTOMY    . COLONOSCOPY WITH PROPOFOL N/A 05/29/2015   Procedure: COLONOSCOPY WITH PROPOFOL;  Surgeon: Hulen Luster, MD;  Location: [facility] ENDOSCOPY;  Service: Gastroenterology;  Laterality: N/A;  . ESOPHAGOGASTRODUODENOSCOPY N/A 05/29/2015   Procedure: ESOPHAGOGASTRODUODENOSCOPY (EGD);  Surgeon: Hulen Luster, MD;  Location: [facility] ENDOSCOPY;  Service: Gastroenterology;  Laterality: N/A;  . EYE SURGERY     eye lids  . OVARIAN CYST REMOVAL     needed transfusion  . PHOTOCOAGULATION WITH LASER Right 11/21/2016   Procedure: PHOTOCOAGULATION WITH LASER;  Surgeon: Ronnell Freshwater, MD;  Location: [place];  Service: Ophthalmology;  Laterality: Right;  A micropulse probe was applied to each hemilimbus with the following settings: 2069mW, 31.3% duty cycle, 90 seconds x 3 applications.    Family History  Problem Relation Age of Onset  . Hypertension Mother   . Osteoporosis Mother   . COPD Mother   . Dementia Mother   . Cancer Father        Colon  . Hypothyroidism Daughter   . Aneurysm Brother    Social History   Socioeconomic History  . Marital status: Widowed    Spouse name: Shanon Brow  . Number of children: 1  . Years of education: Not on file  . Highest education level: 12th grade  Occupational History  . Occupation: Retired  Scientific laboratory technician  . Financial resource strain: Not hard at all  . Food insecurity:    Worry: Never true    Inability: Never true  . Transportation needs:    Medical: No    Non-medical: No  Tobacco Use  . Smoking status: Never Smoker  . Smokeless tobacco: Never Used  . Tobacco comment: smoking cessation materials not required  Substance and Sexual Activity  . Alcohol use: No    Alcohol/week: 0.0 oz  . Drug use: No  . Sexual activity: Not Currently  Lifestyle  . Physical activity:    Days per week: 3 days    Minutes per session: 60 min  . Stress: Not at all  Relationships  . Social connections:    Talks on phone: Patient refused    Gets together: Patient refused    Attends religious service: Patient refused    Active member of club or organization: Patient refused    Attends meetings of clubs or organizations: Patient refused    Relationship status: Widowed  Other Topics Concern  . Not on file  Social History Narrative  . Not on file    Outpatient Encounter Medications as of 03/13/2018  Medication Sig  . ALPRAZolam (XANAX) 0.5 MG tablet TAKE 1 TABLET BY MOUTH DAILY AS NEEDED FOR ANXIETY. MUST LAST 90 DAYS  . aspirin 81 MG tablet Take 81 mg by mouth daily. pm  . cholecalciferol (VITAMIN D) 1000 units tablet Take 1,000 Units by mouth 2 (two)  times daily. am  . fluticasone (FLONASE) 50 MCG/ACT nasal spray Place 2 sprays into both nostrils daily. Pm  . levothyroxine (SYNTHROID, LEVOTHROID) 50 MCG tablet TAKE 1 TABLET BY MOUTH DAILY BEFORE BREAKFAST  . Melatonin 3 MG CAPS Take 1 capsule (3 mg total) by mouth daily.  . Omega-3 Fatty Acids (FISH OIL PO) Take by mouth. AM  . PAZEO 0.7 % SOLN INSTILL 1 GTT IN OU QD/ pm  . timolol (TIMOPTIC) 0.5 % ophthalmic solution INT 1 GTT IN OU BID  . [DISCONTINUED] benzonatate (TESSALON) 100 MG capsule Take 1 capsule (100 mg total) by mouth  3 (three) times daily as needed for cough.  . [DISCONTINUED] citalopram (CELEXA) 10 MG tablet Take 1 tablet (10 mg total) by mouth daily.  . [DISCONTINUED] levocetirizine (XYZAL) 5 MG tablet Take 1 tablet (5 mg total) by mouth every evening.  . [DISCONTINUED] losartan (COZAAR) 50 MG tablet TAKE 1 TABLET(100 MG) BY MOUTH DAILY  . [DISCONTINUED] montelukast (SINGULAIR) 10 MG tablet Take 1 tablet (10 mg total) by mouth daily as needed (allergies).  . [DISCONTINUED] pantoprazole (PROTONIX) 40 MG tablet TAKE 1 TABLET(40 MG) BY MOUTH DAILY  . [DISCONTINUED] simvastatin (ZOCOR) 40 MG tablet Take 1 tablet (40 mg total) by mouth daily. pm  . [DISCONTINUED] zolpidem (AMBIEN) 5 MG tablet Take 2 tablets (10 mg total) by mouth at bedtime.  . [DISCONTINUED] conjugated estrogens (PREMARIN) vaginal cream Place 1 Applicatorful vaginally at bedtime. For two weeks after that use it twice weekly at night  . [DISCONTINUED] guaiFENesin (MUCINEX) 600 MG 12 hr tablet Take 1 tablet (600 mg total) by mouth 2 (two) times daily as needed for cough or to loosen phlegm.   Facility-Administered Encounter Medications as of 03/13/2018  Medication  . cyanocobalamin ((VITAMIN B-12)) injection 1,000 mcg    Activities of Daily Living In your present state of health, do you have any difficulty performing the following activities: 03/13/2018 06/09/2017  Hearing? N N  Comment denies hearing aids -    Vision? N Y  Comment wears eyeglasses glasses  Difficulty concentrating or making decisions? N N  Walking or climbing stairs? N N  Dressing or bathing? N N  Doing errands, shopping? N N  Preparing Food and eating ? N -  Comment denies dentures -  Using the Toilet? N -  In the past six months, have you accidently leaked urine? Y -  Comment stress incontinence; wears pad -  Do you have problems with loss of bowel control? N -  Managing your Medications? N -  Managing your Finances? N -  Housekeeping or managing your Housekeeping? N -  Some recent data might be hidden    Patient Care Team: Steele Sizer, MD as PCP - General (Family Medicine)    Assessment:   This is a routine wellness examination for Abigayle.  Exercise Activities and Dietary recommendations Current Exercise Habits: Structured exercise class, Type of exercise: strength training/weights, Time (Minutes): 60, Frequency (Times/Week): 3, Weekly Exercise (Minutes/Week): 180, Intensity: Mild, Exercise limited by: None identified  Goals    . DIET - INCREASE WATER INTAKE     Recommend to drink at least 6-8 8oz glasses of water per day.       Fall Risk Fall Risk  03/13/2018 06/09/2017 03/08/2017 10/31/2016 10/04/2016  Falls in the past year? No No No No No  Risk for fall due to : Impaired vision - - - -  Risk for fall due to: Comment wears eyeglasses - - - -   Is the home free of loose throw rugs in walkways, pet beds, electrical cords, etc? Yes Adequate lighting to reduce risk of falls?  Yes In addition, does the patient have any of the following: Stairs in or around the home WITH handrails? Yes Grab bars in the bathroom? No  Shower chair or a place to sit while bathing? Yes Use of a cane, walker or w/c? No Use of an elevated toilet seat or a handicapped toilet? No  Timed Get Up and Go Performed: Yes. Pt ambulated 10 feet within 5 sec. Gait stead-fast and without the use of an  assistive device. No intervention  required at this time. Fall risk prevention has been discussed.  Pt declined my offer to send Community Resource Referral to Care Guide for installation of grab bars in the shower, shower chair or an elevated toilet seat.  Depression Screen PHQ 2/9 Scores 03/13/2018 06/09/2017 03/08/2017 10/31/2016  PHQ - 2 Score 0 0 0 0  PHQ- 9 Score 0 - - -     Cognitive Function     6CIT Screen 03/13/2018  What Year? 0 points  What month? 0 points  What time? 0 points  Count back from 20 0 points  Months in reverse 0 points  Repeat phrase 0 points  Total Score 0    Immunization History  Administered Date(s) Administered  . Influenza Split 10/02/2008, 08/18/2010, 09/20/2012  . Influenza, High Dose Seasonal PF 08/25/2015, 09/07/2016, 09/06/2017  . Influenza, Seasonal, Injecte, Preservative Fre 10/09/2014  . Influenza-Unspecified 10/09/2014  . Pneumococcal Conjugate-13 08/18/2010, 03/08/2017  . Pneumococcal Polysaccharide-23 12/30/2013  . Pneumococcal-Unspecified 08/18/2010  . Td 08/18/2010  . Tdap 08/18/2010  . Zoster 07/24/2012    Qualifies for Shingles Vaccine? Yes. Zostavax completed 07/24/12. Due for Shingrix vaccine. Education has been provided regarding the importance of this vaccine. Pt has been advised to call her insurance company to determine her out of pocket expense. Advised she may also receive this vaccine at her local pharmacy or Health Dept. Verbalized acceptance and understanding.  Screening Tests Health Maintenance  Topic Date Due  . MAMMOGRAM  05/17/2018  . COLONOSCOPY  05/28/2020  . TETANUS/TDAP  08/18/2020  . INFLUENZA VACCINE  Completed  . DEXA SCAN  Completed  . Hepatitis C Screening  Completed  . PNA vac Low Risk Adult  Completed    Cancer Screenings: Lung: Low Dose CT Chest recommended if Age 42-80 years, 30 pack-year currently smoking OR have quit w/in 15years. Patient does not qualify. Breast:  Up to date on Mammogram? No. Completed 05/17/17. Repeat every  year. Unable to locate report. Pt states she will provide a copy of her most recent mammogram for review.   Up to date of Bone Density/Dexa? Yes. Completed 02/18/14. Osteoporotic screenings no longer required. Colorectal: Completed 05/29/15. Repeat every 5 years.  Additional Screenings: Hepatitis B/HIV/Syphillis: Does not qualify Hepatitis C Screening: Completed 02/14/13     Plan:  I have personally reviewed and addressed the Medicare Annual Wellness questionnaire and have noted the following in the patient's chart:  A. Medical and social history B. Use of alcohol, tobacco or illicit drugs  C. Current medications and supplements D. Functional ability and status E.  Nutritional status F.  Physical activity G. Advance directives H. List of other physicians I.  Hospitalizations, surgeries, and ER visits in previous 12 months J.  [place] such as hearing and vision if needed, cognitive and depression L. Referrals and appointments - none  In addition, I have reviewed and discussed with patient certain preventive protocols, quality metrics, and best practice recommendations. A written personalized care plan for preventive services as well as general preventive health recommendations were provided to patient.  See attached scanned questionnaire for additional information.   Signed,  Aleatha Borer, LPN Nurse Health Advisor  I have reviewed this encounter including the documentation in this note and/or discussed this patient with the provider, Aleatha Borer, LPN. I am certifying that I agree with the content of this note as supervising physician.  Steele Sizer, MD [facility] 03/13/2018, 12:28 PM

## 2018-03-13 NOTE — Progress Notes (Signed)
Name: Dawn Fuentes   MRN: [id number]    DOB: 08-11-1946   Date:03/13/2018       Progress Note  Subjective  Chief Complaint  Chief Complaint  Patient presents with  . Follow-up    HPI  GAD: she is feeling much better on Citalopram 10 mg, she is very seldom taking alprazolam, called for a refill in 01/2018, but previous rx was over 60 months old.  She is also sleeping better.  HTN: bp at home is 120's Currently on Losartan 50 mg and no side effects. She denies chest pain or palpitation, denies edema  CKI: we will monitor, she is avoiding taking  NSAID's . Only taking Tylenol prn   OA knee: she has noticed worsening of pain on right knee when going up and down stairs and when first standing up from sitting position, both knees but right is worse than left. No effusion or redness, and is no limiting her activity level. She went to Ortho, had some PT and is feeling better now. She is also taking Tumeric   Insomnia: she is taking  Ambien only prn, on half dose of Ambien, she states she does not feel knocked out and no side effects, she is doing well, last rx lasted 6 months   Hypothyroidism: no palpitation, change in bowel movements or dysphagia. Taking medication daily as prescribed.We will recheck labs   GERD: unable to wean self off PPI, she is back on it daily, no heartburn of indigestion as long as she takes medication.  Cough: she was treated for the flu one month ago and still has post-nasal drainage and a dry cough, no wheezing or SOB, getting better . Advised to get nasal steroid and loratadine otc, likely post-nasal drainage  Recurrent UTI she states about twice a year, happens fast and would like to have rx at home   Patient Active Problem List   Diagnosis Date Noted  . Osteoarthritis of knees, bilateral 05/26/2017  . Primary open angle glaucoma of both eyes 11/21/2016  . Chronic kidney disease (CKD), stage III (moderate) ([place]) 04/06/2016  . Cystitis 10/12/2015  .  Benign hypertension 08/21/2015  . Insomnia, persistent 08/21/2015  . Dyslipidemia 08/21/2015  . Gastro-esophageal reflux disease without esophagitis 08/21/2015  . Glaucoma 08/21/2015  . Blood glucose elevated 08/21/2015  . Adult hypothyroidism 08/21/2015  . Climacteric 08/21/2015  . Senile osteoporosis 08/21/2015  . Seasonal allergies 08/21/2015  . Female genuine stress incontinence 08/21/2015  . Vitamin D deficiency 08/21/2015    Past Surgical History:  Procedure Laterality Date  . ABDOMINAL HYSTERECTOMY     21 years ago  . APPENDECTOMY    . COLONOSCOPY WITH PROPOFOL N/A 05/29/2015   Procedure: COLONOSCOPY WITH PROPOFOL;  Surgeon: Hulen Luster, MD;  Location: [facility] ENDOSCOPY;  Service: Gastroenterology;  Laterality: N/A;  . ESOPHAGOGASTRODUODENOSCOPY N/A 05/29/2015   Procedure: ESOPHAGOGASTRODUODENOSCOPY (EGD);  Surgeon: Hulen Luster, MD;  Location: [facility] ENDOSCOPY;  Service: Gastroenterology;  Laterality: N/A;  . EYE SURGERY     eye lids  . OVARIAN CYST REMOVAL     needed transfusion  . PHOTOCOAGULATION WITH LASER Right 11/21/2016   Procedure: PHOTOCOAGULATION WITH LASER;  Surgeon: Ronnell Freshwater, MD;  Location: [place];  Service: Ophthalmology;  Laterality: Right;  A micropulse probe was applied to each hemilimbus with the following settings: 2050mW, 31.3% duty cycle, 90 seconds x 3 applications.     Family History  Problem Relation Age of Onset  . Hypertension Mother   .  Osteoporosis Mother   . COPD Mother   . Dementia Mother   . Cancer Father        Colon  . Hypothyroidism Daughter   . Aneurysm Brother     Social History   Socioeconomic History  . Marital status: Widowed    Spouse name: Shanon Brow  . Number of children: 1  . Years of education: Not on file  . Highest education level: 12th grade  Occupational History  . Occupation: Retired  Scientific laboratory technician  . Financial resource strain: Not hard at all  . Food insecurity:    Worry: Never true     Inability: Never true  . Transportation needs:    Medical: No    Non-medical: No  Tobacco Use  . Smoking status: Never Smoker  . Smokeless tobacco: Never Used  . Tobacco comment: smoking cessation materials not required  Substance and Sexual Activity  . Alcohol use: No    Alcohol/week: 0.0 oz  . Drug use: No  . Sexual activity: Not Currently  Lifestyle  . Physical activity:    Days per week: 3 days    Minutes per session: 60 min  . Stress: Not at all  Relationships  . Social connections:    Talks on phone: Patient refused    Gets together: Patient refused    Attends religious service: Patient refused    Active member of club or organization: Patient refused    Attends meetings of clubs or organizations: Patient refused    Relationship status: Widowed  . Intimate partner violence:    Fear of current or ex partner: No    Emotionally abused: No    Physically abused: No    Forced sexual activity: No  Other Topics Concern  . Not on file  Social History Narrative  . Not on file     Current Outpatient Medications:  .  ALPRAZolam (XANAX) 0.5 MG tablet, TAKE 1 TABLET BY MOUTH DAILY AS NEEDED FOR ANXIETY. MUST LAST 90 DAYS, Disp: 30 tablet, Rfl: 0 .  aspirin 81 MG tablet, Take 81 mg by mouth daily. pm, Disp: , Rfl:  .  cholecalciferol (VITAMIN D) 1000 units tablet, Take 1,000 Units by mouth 2 (two) times daily. am, Disp: , Rfl:  .  ciprofloxacin (CIPRO) 250 MG tablet, Take 1 tablet (250 mg total) by mouth 2 (two) times daily., Disp: 6 tablet, Rfl: 1 .  citalopram (CELEXA) 10 MG tablet, Take 1 tablet (10 mg total) by mouth daily., Disp: 90 tablet, Rfl: 1 .  fluticasone (FLONASE) 50 MCG/ACT nasal spray, Place 2 sprays into both nostrils daily. Pm, Disp: 16 g, Rfl: 2 .  levocetirizine (XYZAL) 5 MG tablet, Take 1 tablet (5 mg total) by mouth every evening., Disp: 90 tablet, Rfl: 1 .  levothyroxine (SYNTHROID, LEVOTHROID) 50 MCG tablet, TAKE 1 TABLET BY MOUTH DAILY BEFORE BREAKFAST,  Disp: 90 tablet, Rfl: 0 .  losartan (COZAAR) 50 MG tablet, TAKE 1 TABLET(100 MG) BY MOUTH DAILY, Disp: 90 tablet, Rfl: 1 .  Melatonin 3 MG CAPS, Take 1 capsule (3 mg total) by mouth daily., Disp: 90 capsule, Rfl: 0 .  montelukast (SINGULAIR) 10 MG tablet, Take 1 tablet (10 mg total) by mouth daily as needed (allergies)., Disp: 90 tablet, Rfl: 1 .  Omega-3 Fatty Acids (FISH OIL PO), Take by mouth. AM, Disp: , Rfl:  .  pantoprazole (PROTONIX) 40 MG tablet, TAKE 1 TABLET(40 MG) BY MOUTH DAILY, Disp: 90 tablet, Rfl: 1 .  PAZEO 0.7 %  SOLN, INSTILL 1 GTT IN OU QD/ pm, Disp: , Rfl: 6 .  simvastatin (ZOCOR) 40 MG tablet, Take 1 tablet (40 mg total) by mouth daily. pm, Disp: 90 tablet, Rfl: 1 .  timolol (TIMOPTIC) 0.5 % ophthalmic solution, INT 1 GTT IN OU BID, Disp: , Rfl: 5 .  zolpidem (AMBIEN) 5 MG tablet, Take 2 tablets (10 mg total) by mouth at bedtime., Disp: 90 tablet, Rfl: 0  Current Facility-Administered Medications:  .  cyanocobalamin ((VITAMIN B-12)) injection 1,000 mcg, 1,000 mcg, Subcutaneous, Q30 days, Steele Sizer, MD, 1,000 mcg at 01/15/18 1454  Allergies  Allergen Reactions  . Brimonidine Tartrate   . Latanoprost   . Raloxifene     bone pain  . Sulfa Antibiotics Nausea Only    Intolerance rather than allergy.     ROS  Constitutional: Negative for fever, positive for mild  weight change - going to weight watchers  Respiratory: Positive  for cough but no shortness of breath.   Cardiovascular: Negative for chest pain or palpitations.  Gastrointestinal: Negative for abdominal pain, no bowel changes.  Musculoskeletal: Negative for gait problem or joint swelling.  Skin: Negative for rash.  Neurological: Negative for dizziness or headache.  No other specific complaints in a complete review of systems (except as listed in HPI above).  Objective  Vitals:   03/13/18 1051  BP: 124/72  Pulse: 66  Resp: 12  Temp: (!) 97 F (36.1 C)  TempSrc: Oral  SpO2: 97%  Weight: 151  lb 14.4 oz (68.9 kg)  Height: 5\' 6"  (1.676 m)    Body mass index is 24.52 kg/m.  Physical Exam  Constitutional: Patient appears well-developed and well-nourished. No distress.  HEENT: head atraumatic, normocephalic, pupils equal and reactive to light, neck supple, throat within normal limits Cardiovascular: Normal rate, regular rhythm and normal heart sounds.  No murmur heard. No BLE edema. Pulmonary/Chest: Effort normal and breath sounds normal. No respiratory distress. Abdominal: Soft.  There is no tenderness. Psychiatric: Patient has a normal mood and affect. behavior is normal. Judgment and thought content normal.  Recent Results (from the past 2160 hour(s))  POCT Influenza A/B     Status: None   Collection Time: 02/09/18  3:18 PM  Result Value Ref Range   Influenza A, POC Negative Negative   Influenza B, POC Negative Negative  POCT urinalysis dipstick     Status: Abnormal   Collection Time: 02/09/18  3:21 PM  Result Value Ref Range   Color, UA yellow    Clarity, UA clear    Glucose, UA negative    Bilirubin, UA negative    Ketones, UA negative    Spec Grav, UA 1.010 1.010 - 1.025   Blood, UA negative    pH, UA 5.0 5.0 - 8.0   Protein, UA negative    Urobilinogen, UA negative (A) 0.2 or 1.0 E.U./dL   Nitrite, UA negative    Leukocytes, UA Negative Negative   Appearance clear    Odor none      PHQ2/9: Depression screen [facility] 2/9 03/13/2018 06/09/2017 03/08/2017 10/31/2016 10/04/2016  Decreased Interest 0 0 0 0 0  Down, Depressed, Hopeless 0 0 0 0 0  PHQ - 2 Score 0 0 0 0 0  Altered sleeping 0 - - - -  Tired, decreased energy 0 - - - -  Change in appetite 0 - - - -  Feeling bad or failure about yourself  0 - - - -  Trouble concentrating 0 - - - -  Moving slowly or fidgety/restless 0 - - - -  Suicidal thoughts 0 - - - -  PHQ-9 Score 0 - - - -  Difficult doing work/chores Not difficult at all - - - -     Fall Risk: Fall Risk  03/13/2018 06/09/2017 03/08/2017  10/31/2016 10/04/2016  Falls in the past year? No No No No No  Risk for fall due to : Impaired vision - - - -  Risk for fall due to: Comment wears eyeglasses - - - -    Assessment & Plan  1. Dyslipidemia  - Lipid panel - simvastatin (ZOCOR) 40 MG tablet; Take 1 tablet (40 mg total) by mouth daily. pm  Dispense: 90 tablet; Refill: 1  2. Gastro-esophageal reflux disease without esophagitis  - pantoprazole (PROTONIX) 40 MG tablet; TAKE 1 TABLET(40 MG) BY MOUTH DAILY  Dispense: 90 tablet; Refill: 1  3. Seasonal allergic rhinitis, unspecified trigger  - montelukast (SINGULAIR) 10 MG tablet; Take 1 tablet (10 mg total) by mouth daily as needed (allergies).  Dispense: 90 tablet; Refill: 1 - levocetirizine (XYZAL) 5 MG tablet; Take 1 tablet (5 mg total) by mouth every evening.  Dispense: 90 tablet; Refill: 1  4. Perennial allergic rhinitis with seasonal variation  - montelukast (SINGULAIR) 10 MG tablet; Take 1 tablet (10 mg total) by mouth daily as needed (allergies).  Dispense: 90 tablet; Refill: 1 - levocetirizine (XYZAL) 5 MG tablet; Take 1 tablet (5 mg total) by mouth every evening.  Dispense: 90 tablet; Refill: 1  5. Benign hypertension  - COMPLETE METABOLIC PANEL WITH GFR - CBC with Differential/Platelet - losartan (COZAAR) 50 MG tablet; TAKE 1 TABLET(100 MG) BY MOUTH DAILY  Dispense: 90 tablet; Refill: 1  6. GAD (generalized anxiety disorder)  - citalopram (CELEXA) 10 MG tablet; Take 1 tablet (10 mg total) by mouth daily.  Dispense: 90 tablet; Refill: 1  7. Insomnia, persistent  - zolpidem (AMBIEN) 5 MG tablet; Take 2 tablets (10 mg total) by mouth at bedtime.  Dispense: 90 tablet; Refill: 0  8. Adult hypothyroidism  - TSH  9. Vitamin D deficiency  - VITAMIN D 25 Hydroxy (Vit-D Deficiency, Fractures)  10. Vitamin [name] deficiency  - Vitamin [name]  11. Encounter for screening mammogram for breast cancer  - MM DIGITAL SCREENING BILATERAL; Future  12. Age related  osteoporosis, unspecified pathological fracture presence  - DG Bone Density; Future  13. Recurrent UTI  - ciprofloxacin (CIPRO) 250 MG tablet; Take 1 tablet (250 mg total) by mouth 2 (two) times daily.  Dispense: 6 tablet; Refill: 1

## 2018-03-14 LAB — COMPLETE METABOLIC PANEL WITHOUT GFR
AG Ratio: 1.7 (calc) (ref 1.0–2.5)
ALT: 16 U/L (ref 6–29)
AST: 19 U/L (ref 10–35)
Albumin: 4.6 g/dL (ref 3.6–5.1)
Alkaline phosphatase (APISO): 58 U/L (ref 33–130)
BUN/Creatinine Ratio: 15 (calc) (ref 6–22)
BUN: 16 mg/dL (ref 7–25)
CO2: 28 mmol/L (ref 20–32)
Calcium: 9.7 mg/dL (ref 8.6–10.4)
Chloride: 102 mmol/L (ref 98–110)
Creat: 1.1 mg/dL — ABNORMAL HIGH (ref 0.50–0.99)
GFR, Est African American: 59 mL/min/{1.73_m2} — ABNORMAL LOW
GFR, Est Non African American: 51 mL/min/{1.73_m2} — ABNORMAL LOW
Globulin: 2.7 g/dL (ref 1.9–3.7)
Glucose, Bld: 76 mg/dL (ref 65–139)
Potassium: 4.4 mmol/L (ref 3.5–5.3)
Sodium: 140 mmol/L (ref 135–146)
Total Bilirubin: 0.4 mg/dL (ref 0.2–1.2)
Total Protein: 7.3 g/dL (ref 6.1–8.1)

## 2018-03-14 LAB — CBC WITH DIFFERENTIAL/PLATELET
Basophils Absolute: 139 {cells}/uL (ref 0–200)
Basophils Relative: 2.2 %
Eosinophils Absolute: 296 {cells}/uL (ref 15–500)
Eosinophils Relative: 4.7 %
HCT: 40.6 % (ref 35.0–45.0)
Hemoglobin: 13.6 g/dL (ref 11.7–15.5)
Lymphs Abs: 1751 {cells}/uL (ref 850–3900)
MCH: 30.7 pg (ref 27.0–33.0)
MCHC: 33.5 g/dL (ref 32.0–36.0)
MCV: 91.6 fL (ref 80.0–100.0)
MPV: 11.4 fL (ref 7.5–12.5)
Monocytes Relative: 12.1 %
Neutro Abs: 3352 {cells}/uL (ref 1500–7800)
Neutrophils Relative %: 53.2 %
Platelets: 233 10*3/uL (ref 140–400)
RBC: 4.43 Million/uL (ref 3.80–5.10)
RDW: 12 % (ref 11.0–15.0)
Total Lymphocyte: 27.8 %
WBC mixed population: 762 {cells}/uL (ref 200–950)
WBC: 6.3 10*3/uL (ref 3.8–10.8)

## 2018-03-14 LAB — LIPID PANEL
Cholesterol: 163 mg/dL
HDL: 56 mg/dL
LDL Cholesterol (Calc): 79 mg/dL
Non-HDL Cholesterol (Calc): 107 mg/dL
Total CHOL/HDL Ratio: 2.9 (calc)
Triglycerides: 179 mg/dL — ABNORMAL HIGH

## 2018-03-14 LAB — VITAMIN B12: Vitamin B-12: 443 pg/mL (ref 200–1100)

## 2018-03-14 LAB — VITAMIN D 25 HYDROXY (VIT D DEFICIENCY, FRACTURES): Vit D, 25-Hydroxy: 48 ng/mL (ref 30–100)

## 2018-03-14 LAB — TSH: TSH: 4.52 m[IU]/L — ABNORMAL HIGH (ref 0.40–4.50)

## 2018-04-09 ENCOUNTER — Telehealth: Payer: Self-pay

## 2018-04-09 ENCOUNTER — Other Ambulatory Visit: Payer: Self-pay | Admitting: Advanced Practice Midwife

## 2018-04-09 DIAGNOSIS — N898 Other specified noninflammatory disorders of vagina: Secondary | ICD-10-CM

## 2018-04-09 MED ORDER — PRASTERONE 6.5 MG VA INST: 1.0000 | VAGINAL_INSERT | Freq: Every day | VAGINAL | 11 refills | Status: DC

## 2018-04-09 NOTE — Telephone Encounter (Signed)
Spoke with patient and sent rx intrarosa per her request

## 2018-04-09 NOTE — Telephone Encounter (Signed)
Pt calling for a sample of intraosa, a rx would be 90 dollars, I looked in all of our sample closets and we are out, please advise if there is something else pt could use. She reports vaginal itching, She states if nothing else she will pay the 90 dollars if you could send in her a prescription for the intraosa  [phone]

## 2018-04-09 NOTE — Progress Notes (Signed)
Rx Intrarosa sent to patient pharmacy

## 2018-04-18 DIAGNOSIS — R58 Hemorrhage, not elsewhere classified: Secondary | ICD-10-CM | POA: Diagnosis not present

## 2018-04-18 DIAGNOSIS — T148XXA Other injury of unspecified body region, initial encounter: Secondary | ICD-10-CM | POA: Diagnosis not present

## 2018-04-18 DIAGNOSIS — M79641 Pain in right hand: Secondary | ICD-10-CM | POA: Diagnosis not present

## 2018-04-18 DIAGNOSIS — S60221A Contusion of right hand, initial encounter: Secondary | ICD-10-CM | POA: Diagnosis not present

## 2018-04-26 ENCOUNTER — Ambulatory Visit (INDEPENDENT_AMBULATORY_CARE_PROVIDER_SITE_OTHER): Payer: PPO

## 2018-04-26 DIAGNOSIS — E538 Deficiency of other specified B group vitamins: Secondary | ICD-10-CM | POA: Diagnosis not present

## 2018-04-26 DIAGNOSIS — E038 Other specified hypothyroidism: Secondary | ICD-10-CM

## 2018-04-26 LAB — TSH: TSH: 9.28 m[IU]/L — ABNORMAL HIGH (ref 0.40–4.50)

## 2018-04-28 ENCOUNTER — Encounter: Payer: Self-pay | Admitting: Family Medicine

## 2018-04-30 ENCOUNTER — Other Ambulatory Visit: Payer: Self-pay | Admitting: Family Medicine

## 2018-04-30 DIAGNOSIS — E038 Other specified hypothyroidism: Secondary | ICD-10-CM

## 2018-04-30 MED ORDER — LEVOTHYROXINE SODIUM 50 MCG PO TABS: ORAL_TABLET | ORAL | 0 refills | Status: DC

## 2018-05-01 DIAGNOSIS — H402213 Chronic angle-closure glaucoma, right eye, severe stage: Secondary | ICD-10-CM | POA: Diagnosis not present

## 2018-05-06 ENCOUNTER — Other Ambulatory Visit: Payer: Self-pay | Admitting: Family Medicine

## 2018-05-06 DIAGNOSIS — E038 Other specified hypothyroidism: Secondary | ICD-10-CM

## 2018-05-06 DIAGNOSIS — E039 Hypothyroidism, unspecified: Secondary | ICD-10-CM

## 2018-05-06 NOTE — Telephone Encounter (Signed)
Ordered for repeat TSH placed. Sending refill, to take one daily and two on Sunday, needs to recheck level in one month

## 2018-05-07 ENCOUNTER — Other Ambulatory Visit: Payer: Self-pay

## 2018-05-07 DIAGNOSIS — F411 Generalized anxiety disorder: Secondary | ICD-10-CM

## 2018-05-07 NOTE — Telephone Encounter (Signed)
Refill request for general medication. Alprazolam to [facility].  Last office visit: 03/13/2018   Follow up on 09/13/18

## 2018-05-07 NOTE — Telephone Encounter (Signed)
Does she need it now? Last time it lasted her 6 months

## 2018-05-08 MED ORDER — ALPRAZOLAM 0.5 MG PO TABS: 0.5000 mg | ORAL_TABLET | Freq: Every evening | ORAL | 0 refills | Status: DC | PRN

## 2018-05-08 NOTE — Telephone Encounter (Signed)
The patient does need this now she requested it during our phone conversation while we were discussing her TSH results. On the prescription in 02/19 there was only 30 tablets given with the instructions it must last 90 days.

## 2018-06-04 ENCOUNTER — Ambulatory Visit (INDEPENDENT_AMBULATORY_CARE_PROVIDER_SITE_OTHER): Payer: PPO

## 2018-06-04 DIAGNOSIS — E538 Deficiency of other specified B group vitamins: Secondary | ICD-10-CM | POA: Diagnosis not present

## 2018-06-04 DIAGNOSIS — E039 Hypothyroidism, unspecified: Secondary | ICD-10-CM | POA: Diagnosis not present

## 2018-06-04 LAB — TSH: TSH: 2.83 m[IU]/L (ref 0.40–4.50)

## 2018-06-04 NOTE — Progress Notes (Signed)
Patient came in for her B-12 injection. She/He tolerated it well. NKDA.    

## 2018-07-11 ENCOUNTER — Encounter: Payer: Self-pay | Admitting: Family Medicine

## 2018-07-11 ENCOUNTER — Ambulatory Visit (INDEPENDENT_AMBULATORY_CARE_PROVIDER_SITE_OTHER): Payer: PPO | Admitting: Family Medicine

## 2018-07-11 VITALS — BP 126/74 | HR 100 | Temp 98.1°F | Resp 16 | Ht 66.0 in | Wt 159.6 lb

## 2018-07-11 DIAGNOSIS — Z23 Encounter for immunization: Secondary | ICD-10-CM

## 2018-07-11 DIAGNOSIS — N3001 Acute cystitis with hematuria: Secondary | ICD-10-CM | POA: Diagnosis not present

## 2018-07-11 DIAGNOSIS — E538 Deficiency of other specified B group vitamins: Secondary | ICD-10-CM | POA: Diagnosis not present

## 2018-07-11 LAB — POCT URINALYSIS DIPSTICK
Bilirubin, UA: NEGATIVE
Glucose, UA: NEGATIVE
Ketones, UA: NEGATIVE
Nitrite, UA: NEGATIVE
Protein, UA: POSITIVE — AB
Spec Grav, UA: 1.01
Urobilinogen, UA: 0.2 U/dL
pH, UA: 5

## 2018-07-11 MED ORDER — CYANOCOBALAMIN 1000 MCG/ML IJ SOLN
1000.0000 ug | Freq: Once | INTRAMUSCULAR | Status: AC
  Administered 2018-07-11: 1000 ug via INTRAMUSCULAR

## 2018-07-11 MED ORDER — NITROFURANTOIN MONOHYD MACRO 100 MG PO CAPS: 100.0000 mg | ORAL_CAPSULE | Freq: Two times a day (BID) | ORAL | 0 refills | Status: DC

## 2018-07-11 MED ORDER — CIPROFLOXACIN HCL 250 MG PO TABS: 250.0000 mg | ORAL_TABLET | Freq: Two times a day (BID) | ORAL | 0 refills | Status: AC

## 2018-07-11 NOTE — Patient Instructions (Signed)

## 2018-07-11 NOTE — Progress Notes (Signed)
Name: Dawn Fuentes   MRN: [id number]    DOB: [birth year]   Date:07/11/2018       Progress Note  Subjective  Chief Complaint  Chief Complaint  Patient presents with  . Urinary Tract Infection    burning and pain started today    HPI  PT presents with concern for UTI - symptoms started this morning upon waking and going to the bathroom - she is experiencing dysuria, and did a test strip with AZO this morning and it was positive.  Denies back/flank/abdominal pain, frank hematuria, vomiting, nausea.  No history kidney stones.  Shingrix Vaccine: Would like to have the shingrix vaccine.    Patient Active Problem List   Diagnosis Date Noted  . Osteoarthritis of knees, bilateral 05/26/2017  . Primary open angle glaucoma of both eyes 11/21/2016  . Chronic kidney disease (CKD), stage III (moderate) () 04/06/2016  . Cystitis 10/12/2015  . Benign hypertension 08/21/2015  . Insomnia, persistent 08/21/2015  . Dyslipidemia 08/21/2015  . Gastro-esophageal reflux disease without esophagitis 08/21/2015  . Glaucoma 08/21/2015  . Blood glucose elevated 08/21/2015  . Adult hypothyroidism 08/21/2015  . Climacteric 08/21/2015  . Senile osteoporosis 08/21/2015  . Seasonal allergies 08/21/2015  . Female genuine stress incontinence 08/21/2015  . Vitamin D deficiency 08/21/2015    Social History   Tobacco Use  . Smoking status: Never Smoker  . Smokeless tobacco: Never Used  . Tobacco comment: smoking cessation materials not required  Substance Use Topics  . Alcohol use: No    Alcohol/week: 0.0 oz     Current Outpatient Medications:  .  ALPRAZolam (XANAX) 0.5 MG tablet, Take 1 tablet (0.5 mg total) by mouth at bedtime as needed for anxiety. Must last at least 90 days, Disp: 30 tablet, Rfl: 0 .  aspirin 81 MG tablet, Take 81 mg by mouth daily. pm, Disp: , Rfl:  .  cholecalciferol (VITAMIN D) 1000 units tablet, Take 1,000 Units by mouth 2 (two) times daily. am, Disp: , Rfl:  .   citalopram (CELEXA) 10 MG tablet, Take 1 tablet (10 mg total) by mouth daily., Disp: 90 tablet, Rfl: 1 .  fluticasone (FLONASE) 50 MCG/ACT nasal spray, Place 2 sprays into both nostrils daily. Pm, Disp: 16 g, Rfl: 2 .  levocetirizine (XYZAL) 5 MG tablet, Take 1 tablet (5 mg total) by mouth every evening., Disp: 90 tablet, Rfl: 1 .  levothyroxine (SYNTHROID, LEVOTHROID) 50 MCG tablet, TAKE 1 TABLET BY MOUTH DAILY BEFORE BREAKFAST AND 2 TABLETS ON SUNDAY, Disp: 103 tablet, Rfl: 0 .  losartan (COZAAR) 50 MG tablet, TAKE 1 TABLET(100 MG) BY MOUTH DAILY, Disp: 90 tablet, Rfl: 1 .  Melatonin 3 MG CAPS, Take 1 capsule (3 mg total) by mouth daily., Disp: 90 capsule, Rfl: 0 .  montelukast (SINGULAIR) 10 MG tablet, Take 1 tablet (10 mg total) by mouth daily as needed (allergies)., Disp: 90 tablet, Rfl: 1 .  Omega-3 Fatty Acids (FISH OIL PO), Take by mouth. AM, Disp: , Rfl:  .  pantoprazole (PROTONIX) 40 MG tablet, TAKE 1 TABLET(40 MG) BY MOUTH DAILY, Disp: 90 tablet, Rfl: 1 .  PAZEO 0.7 % SOLN, INSTILL 1 GTT IN OU QD/ pm, Disp: , Rfl: 6 .  simvastatin (ZOCOR) 40 MG tablet, Take 1 tablet (40 mg total) by mouth daily. pm, Disp: 90 tablet, Rfl: 1 .  timolol (TIMOPTIC) 0.5 % ophthalmic solution, INT 1 GTT IN OU BID, Disp: , Rfl: 5 .  zolpidem (AMBIEN) 5 MG tablet, Take  2 tablets (10 mg total) by mouth at bedtime., Disp: 90 tablet, Rfl: 0 .  ciprofloxacin (CIPRO) 250 MG tablet, Take 1 tablet (250 mg total) by mouth 2 (two) times daily., Disp: 6 tablet, Rfl: 1 .  Prasterone (INTRAROSA) 6.5 MG INST, Place 1 suppository vaginally at bedtime. (Patient not taking: Reported on 07/11/2018), Disp: 30 each, Rfl: 11  Current Facility-Administered Medications:  .  cyanocobalamin ((VITAMIN B-12)) injection 1,000 mcg, 1,000 mcg, Subcutaneous, Q30 days, Steele Sizer, MD, 1,000 mcg at 04/26/18 1105  Allergies  Allergen Reactions  . Brimonidine Tartrate   . Latanoprost   . Raloxifene     bone pain  . Sulfa Antibiotics  Nausea Only    Intolerance rather than allergy.    ROS  Ten systems reviewed and is negative except as mentioned in HPI.  Objective  Vitals:   07/11/18 1449  BP: 126/74  Pulse: 100  Resp: 16  Temp: 98.1 F (36.7 C)  TempSrc: Oral  SpO2: 97%  Weight: 159 lb 9.6 oz (72.4 kg)  Height: 5\' 6"  (1.676 m)   Body mass index is 25.76 kg/m.  Nursing Note and Vital Signs reviewed.  Physical Exam  Constitutional: Patient appears well-developed and well-nourished. Obese. No distress.  HEENT: head atraumatic, normocephalic Cardiovascular: Normal rate, regular rhythm and normal heart sounds.  No murmur heard. No BLE edema. Pulmonary/Chest: Effort normal and breath sounds normal. No respiratory distress. Abdominal: Soft.  There is no tenderness. No CVA tenderness. Psychiatric: Patient has a normal mood and affect. behavior is normal. Judgment and thought content normal.  No results found for this or any previous visit (from the past 72 hour(s)).  Assessment & Plan  1. Acute cystitis with hematuria - POCT urinalysis dipstick - Urine Culture - ciprofloxacin (CIPRO) 250 MG tablet; Take 1 tablet (250 mg total) by mouth 2 (two) times daily for 3 days.  Dispense: 6 tablet; Refill: 0 - Creatinine Clearance is 55 - macrobid CI'd; Allergic to bactrim, has done well with Cipro in the past, discussed new safety data, pt verbalizes understanding. - Advised she has history of vaginal atrophy, has premarin at home but has not been using. I recommended she start using this again to possibly help prevent future UTI's. She will trial this and if improvement is noted, will speak to PCP at next follow up regarding refilling the medication.  2. [name] deficiency - cyanocobalamin ((VITAMIN B-12)) injection 1,000 mcg  3. Need for shingles vaccine - PT will contact her insurance company regarding coverage and will return for nurse visit if able to obtain in her vaccine in office or at the pharmacy.  -Red  flags and when to present for emergency care or RTC including fever >101.72F, chest pain, shortness of breath, new/worsening/un-resolving symptoms, frank hematuria, flank pain/back pain/abdominal pain reviewed with patient at time of visit. Follow up and care instructions discussed and provided in AVS.

## 2018-07-13 LAB — URINE CULTURE
MICRO NUMBER:: 90877431
SPECIMEN QUALITY:: ADEQUATE

## 2018-08-09 ENCOUNTER — Other Ambulatory Visit: Payer: Self-pay | Admitting: Family Medicine

## 2018-08-09 DIAGNOSIS — F411 Generalized anxiety disorder: Secondary | ICD-10-CM

## 2018-08-09 NOTE — Telephone Encounter (Signed)
Refill request for general medication. Alprazolam   Last office visit 07/11/18   Follow up on 09/11/18

## 2018-08-15 ENCOUNTER — Other Ambulatory Visit: Payer: Self-pay | Admitting: Family Medicine

## 2018-08-15 DIAGNOSIS — G4700 Insomnia, unspecified: Secondary | ICD-10-CM

## 2018-08-16 NOTE — Telephone Encounter (Signed)
Refill request for general medication. Ambien to [facility].   Last office visit 03/13/2018   Follow up on 09/11/2018

## 2018-08-29 ENCOUNTER — Other Ambulatory Visit: Payer: Self-pay | Admitting: Family Medicine

## 2018-08-29 DIAGNOSIS — E038 Other specified hypothyroidism: Secondary | ICD-10-CM

## 2018-08-29 NOTE — Telephone Encounter (Signed)
She needs to come in for TSH, we adjusted her dose and she has not been back for a recheck yet

## 2018-08-31 NOTE — Telephone Encounter (Signed)
Dawn Fuentes TSH was elevated and Dr. Ancil Boozer changed her dosage, in June it came back Normal and to continue regimen. The patient is coming in on 09/11/2018 and wanted to get her blood work then. Could you send in just a 30 day supply until her appointment? Please advise.

## 2018-09-03 ENCOUNTER — Other Ambulatory Visit: Payer: Self-pay | Admitting: Family Medicine

## 2018-09-03 DIAGNOSIS — E038 Other specified hypothyroidism: Secondary | ICD-10-CM

## 2018-09-06 DIAGNOSIS — E2839 Other primary ovarian failure: Secondary | ICD-10-CM | POA: Diagnosis not present

## 2018-09-06 DIAGNOSIS — Z780 Asymptomatic menopausal state: Secondary | ICD-10-CM | POA: Diagnosis not present

## 2018-09-06 DIAGNOSIS — R928 Other abnormal and inconclusive findings on diagnostic imaging of breast: Secondary | ICD-10-CM | POA: Diagnosis not present

## 2018-09-06 DIAGNOSIS — Z1231 Encounter for screening mammogram for malignant neoplasm of breast: Secondary | ICD-10-CM | POA: Diagnosis not present

## 2018-09-06 DIAGNOSIS — M8588 Other specified disorders of bone density and structure, other site: Secondary | ICD-10-CM | POA: Diagnosis not present

## 2018-09-06 LAB — HM MAMMOGRAPHY: HM Mammogram: NORMAL

## 2018-09-06 LAB — HM DEXA SCAN

## 2018-09-07 ENCOUNTER — Encounter: Payer: Self-pay | Admitting: Family Medicine

## 2018-09-10 DIAGNOSIS — H401132 Primary open-angle glaucoma, bilateral, moderate stage: Secondary | ICD-10-CM | POA: Diagnosis not present

## 2018-09-10 DIAGNOSIS — R928 Other abnormal and inconclusive findings on diagnostic imaging of breast: Secondary | ICD-10-CM | POA: Diagnosis not present

## 2018-09-10 LAB — HM MAMMOGRAPHY: HM Mammogram: NORMAL

## 2018-09-11 ENCOUNTER — Ambulatory Visit (INDEPENDENT_AMBULATORY_CARE_PROVIDER_SITE_OTHER): Payer: PPO | Admitting: Family Medicine

## 2018-09-11 ENCOUNTER — Encounter: Payer: Self-pay | Admitting: Family Medicine

## 2018-09-11 VITALS — BP 128/84 | HR 86 | Temp 97.9°F | Resp 16 | Ht 66.0 in | Wt 162.4 lb

## 2018-09-11 DIAGNOSIS — J3089 Other allergic rhinitis: Secondary | ICD-10-CM

## 2018-09-11 DIAGNOSIS — K219 Gastro-esophageal reflux disease without esophagitis: Secondary | ICD-10-CM | POA: Diagnosis not present

## 2018-09-11 DIAGNOSIS — E559 Vitamin D deficiency, unspecified: Secondary | ICD-10-CM | POA: Diagnosis not present

## 2018-09-11 DIAGNOSIS — J302 Other seasonal allergic rhinitis: Secondary | ICD-10-CM

## 2018-09-11 DIAGNOSIS — F411 Generalized anxiety disorder: Secondary | ICD-10-CM

## 2018-09-11 DIAGNOSIS — E538 Deficiency of other specified B group vitamins: Secondary | ICD-10-CM

## 2018-09-11 DIAGNOSIS — Z23 Encounter for immunization: Secondary | ICD-10-CM | POA: Diagnosis not present

## 2018-09-11 DIAGNOSIS — E039 Hypothyroidism, unspecified: Secondary | ICD-10-CM

## 2018-09-11 DIAGNOSIS — M810 Age-related osteoporosis without current pathological fracture: Secondary | ICD-10-CM

## 2018-09-11 DIAGNOSIS — E785 Hyperlipidemia, unspecified: Secondary | ICD-10-CM

## 2018-09-11 DIAGNOSIS — I10 Essential (primary) hypertension: Secondary | ICD-10-CM

## 2018-09-11 LAB — TSH: TSH: 2.23 m[IU]/L (ref 0.40–4.50)

## 2018-09-11 MED ORDER — LOSARTAN POTASSIUM 50 MG PO TABS: ORAL_TABLET | ORAL | 1 refills | Status: DC

## 2018-09-11 MED ORDER — CITALOPRAM HYDROBROMIDE 10 MG PO TABS: 10.0000 mg | ORAL_TABLET | Freq: Every day | ORAL | 1 refills | Status: DC

## 2018-09-11 MED ORDER — PANTOPRAZOLE SODIUM 40 MG PO TBEC: DELAYED_RELEASE_TABLET | ORAL | 1 refills | Status: DC

## 2018-09-11 MED ORDER — LEVOCETIRIZINE DIHYDROCHLORIDE 5 MG PO TABS: 5.0000 mg | ORAL_TABLET | Freq: Every evening | ORAL | 1 refills | Status: DC

## 2018-09-11 MED ORDER — FLUTICASONE PROPIONATE 50 MCG/ACT NA SUSP: 2.0000 | Freq: Every day | NASAL | 2 refills | Status: DC

## 2018-09-11 MED ORDER — SIMVASTATIN 40 MG PO TABS: 40.0000 mg | ORAL_TABLET | Freq: Every day | ORAL | 1 refills | Status: DC

## 2018-09-11 NOTE — Progress Notes (Signed)
Name: Dawn Fuentes   MRN: [id number]    DOB: 1946-01-04   Date:09/11/2018       Progress Note  Subjective  Chief Complaint  Chief Complaint  Patient presents with  . Follow-up    6 mth f/u  . Anxiety  . Hypertension  . Chronic Kidney Disease  . OA  . Insomnia  . Hypothyroidism  . Gastroesophageal Reflux  . Recurrent UTI  . Allergic Rhinitis   . Osteoporosis  . Immunizations  . Medication Refill    HPI  GAD: she is feeling much better on Citalopram 10 mg,she is very seldom taking alprazolam, called for a refill in 07/2018. Very seldom she has anxiety, yesterday got a call from [facility] for repeat mammogram and got frighten, she wants to continue medication   HTN: bp at home is 120's Currently on Losartan 50 mg and no side effects. She denies chest pain or palpitation, denies edema, no dizziness  CKI: we will monitor, sheisavoiding takingNSAID's . Only taking Tylenol prn Recheck labs next visit   OA knee: she has noticed worsening of pain on right knee when going up and down stairs and when first standing up from sitting position, both knees but right is worse than left. No effusion or redness, and is no limiting her activity level. She went to Ortho, had some PT and is feeling better now. She is also taking Tumeric Unchanged since last visit   Insomnia: she is taking  Ambien only prn, on half dose of Ambien, she states she does not feel knocked out and no side effects, she is doing well. Called in August for a refill of medication  Hypothyroidism: no palpitation, change in bowel movements or dysphagia. Taking medication daily as prescribed. We will recheck labs   GERD: unable to wean self off PPI, she is back on it daily, no heartburn of indigestion as long as she takes medication. Discussed long term risk of medication again  Osteoporosis: she took fosamax long time ago, and is afraid to go back on therapy, discussed prolia and she would like to see Endocrinologist    AR: she has intermittent symptoms, she noticed some nasal congestion, no itching or rhinorrhea, using nasal steroid, she will resume xyzal and montelukast at this time  Recurrent last episodes of UTI and took antibiotics, doing well at this time  Patient Active Problem List   Diagnosis Date Noted  . Osteoarthritis of knees, bilateral 05/26/2017  . Primary open angle glaucoma of both eyes 11/21/2016  . Chronic kidney disease (CKD), stage III (moderate) ([place]) 04/06/2016  . Cystitis 10/12/2015  . Benign hypertension 08/21/2015  . Insomnia, persistent 08/21/2015  . Dyslipidemia 08/21/2015  . Gastro-esophageal reflux disease without esophagitis 08/21/2015  . Glaucoma 08/21/2015  . Blood glucose elevated 08/21/2015  . Adult hypothyroidism 08/21/2015  . Climacteric 08/21/2015  . Senile osteoporosis 08/21/2015  . Seasonal allergies 08/21/2015  . Female genuine stress incontinence 08/21/2015  . Vitamin D deficiency 08/21/2015    Past Surgical History:  Procedure Laterality Date  . ABDOMINAL HYSTERECTOMY     21 years ago  . APPENDECTOMY    . COLONOSCOPY WITH PROPOFOL N/A 05/29/2015   Procedure: COLONOSCOPY WITH PROPOFOL;  Surgeon: Hulen Luster, MD;  Location: [facility] ENDOSCOPY;  Service: Gastroenterology;  Laterality: N/A;  . ESOPHAGOGASTRODUODENOSCOPY N/A 05/29/2015   Procedure: ESOPHAGOGASTRODUODENOSCOPY (EGD);  Surgeon: Hulen Luster, MD;  Location: [facility] ENDOSCOPY;  Service: Gastroenterology;  Laterality: N/A;  . EYE SURGERY     eye  lids  . OVARIAN CYST REMOVAL     needed transfusion  . PHOTOCOAGULATION WITH LASER Right 11/21/2016   Procedure: PHOTOCOAGULATION WITH LASER;  Surgeon: Ronnell Freshwater, MD;  Location: [place];  Service: Ophthalmology;  Laterality: Right;  A micropulse probe was applied to each hemilimbus with the following settings: 2081mW, 31.3% duty cycle, 90 seconds x 3 applications.     Family History  Problem Relation Age of Onset  . Hypertension  Mother   . Osteoporosis Mother   . COPD Mother   . Dementia Mother   . Cancer Father        Colon  . Hypothyroidism Daughter   . Aneurysm Brother     Social History   Socioeconomic History  . Marital status: Widowed    Spouse name: Shanon Brow  . Number of children: 1  . Years of education: Not on file  . Highest education level: 12th grade  Occupational History  . Occupation: Retired  Scientific laboratory technician  . Financial resource strain: Not hard at all  . Food insecurity:    Worry: Never true    Inability: Never true  . Transportation needs:    Medical: No    Non-medical: No  Tobacco Use  . Smoking status: Never Smoker  . Smokeless tobacco: Never Used  . Tobacco comment: smoking cessation materials not required  Substance and Sexual Activity  . Alcohol use: No    Alcohol/week: 0.0 standard drinks  . Drug use: No  . Sexual activity: Not Currently  Lifestyle  . Physical activity:    Days per week: 3 days    Minutes per session: 60 min  . Stress: Not at all  Relationships  . Social connections:    Talks on phone: Patient refused    Gets together: Patient refused    Attends religious service: Patient refused    Active member of club or organization: Patient refused    Attends meetings of clubs or organizations: Patient refused    Relationship status: Widowed  . Intimate partner violence:    Fear of current or ex partner: No    Emotionally abused: No    Physically abused: No    Forced sexual activity: No  Other Topics Concern  . Not on file  Social History Narrative   Widow since 2013   Started to date in 2019     Current Outpatient Medications:  .  ALPRAZolam (XANAX) 0.5 MG tablet, TAKE 1 TABLET(0.5 MG) BY MOUTH AT BEDTIME AS NEEDED FOR ANXIETY. MUST LAST AT LEAST 90 DAYS, Disp: 30 tablet, Rfl: 0 .  aspirin 81 MG tablet, Take 81 mg by mouth daily. pm, Disp: , Rfl:  .  cholecalciferol (VITAMIN D) 1000 units tablet, Take 1,000 Units by mouth 2 (two) times daily. am, Disp:  , Rfl:  .  citalopram (CELEXA) 10 MG tablet, Take 1 tablet (10 mg total) by mouth daily., Disp: 90 tablet, Rfl: 1 .  fluticasone (FLONASE) 50 MCG/ACT nasal spray, Place 2 sprays into both nostrils daily. Pm, Disp: 16 g, Rfl: 2 .  Influenza vac split quadrivalent PF (FLUZONE HIGH-DOSE) 0.5 ML injection, , Disp: , Rfl:  .  levocetirizine (XYZAL) 5 MG tablet, Take 1 tablet (5 mg total) by mouth every evening., Disp: 90 tablet, Rfl: 1 .  levothyroxine (SYNTHROID, LEVOTHROID) 50 MCG tablet, TAKE 1 TABLET BY MOUTH DAILY BEFORE BREAKFAST AND 2 TABLETS ON SUNDAY, Disp: 32 tablet, Rfl: 2 .  losartan (COZAAR) 50 MG tablet, TAKE 1 TABLET(100  MG) BY MOUTH DAILY, Disp: 90 tablet, Rfl: 1 .  Melatonin 3 MG CAPS, Take 1 capsule (3 mg total) by mouth daily., Disp: 90 capsule, Rfl: 0 .  montelukast (SINGULAIR) 10 MG tablet, Take 1 tablet (10 mg total) by mouth daily as needed (allergies)., Disp: 90 tablet, Rfl: 1 .  Omega-3 Fatty Acids (FISH OIL PO), Take by mouth. AM, Disp: , Rfl:  .  pantoprazole (PROTONIX) 40 MG tablet, TAKE 1 TABLET(40 MG) BY MOUTH DAILY, Disp: 90 tablet, Rfl: 1 .  simvastatin (ZOCOR) 40 MG tablet, Take 1 tablet (40 mg total) by mouth daily. pm, Disp: 90 tablet, Rfl: 1 .  timolol (TIMOPTIC) 0.5 % ophthalmic solution, INT 1 GTT IN OU BID, Disp: , Rfl: 5 .  VYZULTA 0.024 % SOLN, INT 1 GTT INTO OU AT NIGHT, Disp: , Rfl: 2 .  zolpidem (AMBIEN) 5 MG tablet, Take 1 tablet (5 mg total) by mouth at bedtime as needed for sleep., Disp: 90 tablet, Rfl: 0  Current Facility-Administered Medications:  .  cyanocobalamin ((VITAMIN B-12)) injection 1,000 mcg, 1,000 mcg, Subcutaneous, Q30 days, Steele Sizer, MD, 1,000 mcg at 09/11/18 1201  Allergies  Allergen Reactions  . Brimonidine Tartrate   . Latanoprost   . Raloxifene     bone pain  . Sulfa Antibiotics Nausea Only    Intolerance rather than allergy.    I personally reviewed active problem list, medication list, allergies, family history, social  history with the patient/caregiver today.   ROS  Constitutional: Negative for fever or weight change.  Respiratory: Negative for cough and shortness of breath.   Cardiovascular: Negative for chest pain or palpitations.  Gastrointestinal: Negative for abdominal pain, no bowel changes.  Musculoskeletal: Negative for gait problem or joint swelling.  Skin: Negative for rash.  Neurological: Negative for dizziness or headache.  No other specific complaints in a complete review of systems (except as listed in HPI above).  Objective  Vitals:   09/11/18 1149  BP: 128/84  Pulse: 86  Resp: 16  Temp: 97.9 F (36.6 C)  TempSrc: Oral  SpO2: 99%  Weight: 162 lb 6.4 oz (73.7 kg)  Height: 5\' 6"  (1.676 m)    Body mass index is 26.21 kg/m.  Physical Exam  Constitutional: Patient appears well-developed and well-nourished. Overweight.  No distress.  HEENT: head atraumatic, normocephalic, pupils equal and reactive to light,  neck supple, throat within normal limits Cardiovascular: Normal rate, regular rhythm and normal heart sounds.  No murmur heard. No BLE edema. Pulmonary/Chest: Effort normal and breath sounds normal. No respiratory distress. Abdominal: Soft.  There is no tenderness. Psychiatric: Patient has a normal mood and affect. behavior is normal. Judgment and thought content normal.    PHQ2/9: Depression screen [facility] 2/9 09/11/2018 07/11/2018 03/13/2018 06/09/2017 03/08/2017  Decreased Interest 0 0 0 0 0  Down, Depressed, Hopeless 0 0 0 0 0  PHQ - 2 Score 0 0 0 0 0  Altered sleeping 2 1 0 - -  Tired, decreased energy 0 0 0 - -  Change in appetite 0 0 0 - -  Feeling bad or failure about yourself  0 0 0 - -  Trouble concentrating 0 0 0 - -  Moving slowly or fidgety/restless 0 0 0 - -  Suicidal thoughts 0 0 0 - -  PHQ-9 Score 2 1 0 - -  Difficult doing work/chores Not difficult at all Not difficult at all Not difficult at all - -    Fall Risk: Fall  Risk  09/11/2018 07/11/2018  03/13/2018 06/09/2017 03/08/2017  Falls in the past year? No No No No No  Risk for fall due to : - - Impaired vision - -  Risk for fall due to: Comment - - wears eyeglasses - -     Functional Status Survey: Is the patient deaf or have difficulty hearing?: No Does the patient have difficulty seeing, even when wearing glasses/contacts?: Yes(glasses) Does the patient have difficulty concentrating, remembering, or making decisions?: No Does the patient have difficulty walking or climbing stairs?: No Does the patient have difficulty dressing or bathing?: No Does the patient have difficulty doing errands alone such as visiting a doctor's office or shopping?: No   Assessment & Plan  1. Adult hypothyroidism  - TSH  2. Need for immunization against influenza  - Flu vaccine HIGH DOSE PF (Fluzone High dose)  3. Dyslipidemia  - simvastatin (ZOCOR) 40 MG tablet; Take 1 tablet (40 mg total) by mouth daily. pm  Dispense: 90 tablet; Refill: 1  4. Gastro-esophageal reflux disease without esophagitis  - pantoprazole (PROTONIX) 40 MG tablet; TAKE 1 TABLET(40 MG) BY MOUTH DAILY  Dispense: 90 tablet; Refill: 1  5. GAD (generalized anxiety disorder)  - citalopram (CELEXA) 10 MG tablet; Take 1 tablet (10 mg total) by mouth daily.  Dispense: 90 tablet; Refill: 1  6. Benign hypertension  - losartan (COZAAR) 50 MG tablet; TAKE 1 TABLET(100 MG) BY MOUTH DAILY  Dispense: 90 tablet; Refill: 1  7. Vitamin [name] deficiency   8. Vitamin D deficiency   9. Age related osteoporosis, unspecified pathological fracture presence  - Ambulatory referral to Endocrinology  10. Perennial allergic rhinitis with seasonal variation  - fluticasone (FLONASE) 50 MCG/ACT nasal spray; Place 2 sprays into both nostrils daily. Pm  Dispense: 16 g; Refill: 2 - levocetirizine (XYZAL) 5 MG tablet; Take 1 tablet (5 mg total) by mouth every evening.  Dispense: 90 tablet; Refill: 1

## 2018-09-20 ENCOUNTER — Other Ambulatory Visit: Payer: Self-pay | Admitting: Family Medicine

## 2018-09-20 NOTE — Telephone Encounter (Signed)
Copied from [place] [id number]. Topic: Referral - Status >> Sep 20, 2018  9:30 AM Ahmed Prima L wrote: Reason for CRM: Patient has not yet heard from [facility], Betsey Holiday, MD office regarding her referral that was sent to them. Patient would like a call back.\  This information has been faxed over to our referral coordinator for review.

## 2018-09-20 NOTE — Telephone Encounter (Signed)
Copied from [employer] [id number]. Topic: Quick Communication - See Telephone Encounter >> Sep 20, 2018  9:28 AM Ahmed Prima L wrote: CRM for notification. See Telephone encounter for: 09/20/18.  Patient is requesting a 90 day supply of levothyroxine (SYNTHROID, LEVOTHROID) 50 MCG tablet. Please advise.  [facility] [place], [place] AT [facility] OF [address] [place] [id number]

## 2018-09-24 DIAGNOSIS — M810 Age-related osteoporosis without current pathological fracture: Secondary | ICD-10-CM | POA: Diagnosis not present

## 2018-09-27 ENCOUNTER — Other Ambulatory Visit: Payer: Self-pay | Admitting: Family Medicine

## 2018-09-27 DIAGNOSIS — E038 Other specified hypothyroidism: Secondary | ICD-10-CM

## 2018-10-10 ENCOUNTER — Encounter: Payer: Self-pay | Admitting: Family Medicine

## 2018-10-10 ENCOUNTER — Ambulatory Visit (INDEPENDENT_AMBULATORY_CARE_PROVIDER_SITE_OTHER): Payer: PPO | Admitting: Family Medicine

## 2018-10-10 VITALS — BP 116/72 | HR 93 | Temp 98.0°F | Resp 16 | Ht 66.0 in | Wt 162.0 lb

## 2018-10-10 DIAGNOSIS — N3000 Acute cystitis without hematuria: Secondary | ICD-10-CM

## 2018-10-10 DIAGNOSIS — K5909 Other constipation: Secondary | ICD-10-CM

## 2018-10-10 DIAGNOSIS — E538 Deficiency of other specified B group vitamins: Secondary | ICD-10-CM

## 2018-10-10 LAB — POCT URINALYSIS DIPSTICK
Bilirubin, UA: NEGATIVE
Glucose, UA: NEGATIVE
Ketones, UA: NEGATIVE
Nitrite, UA: NEGATIVE
Protein, UA: POSITIVE — AB
Spec Grav, UA: <=1.005 — AB
Urobilinogen, UA: NEGATIVE U/dL — AB
pH, UA: 5

## 2018-10-10 MED ORDER — CIPROFLOXACIN HCL 250 MG PO TABS: 250.0000 mg | ORAL_TABLET | Freq: Two times a day (BID) | ORAL | 0 refills | Status: DC

## 2018-10-10 MED ORDER — POLYETHYLENE GLYCOL 3350 17 GM/SCOOP PO POWD: 17.0000 g | Freq: Every day | ORAL | 0 refills | Status: DC

## 2018-10-10 NOTE — Progress Notes (Signed)
Name: Dawn Fuentes   MRN: [id number]    DOB: 1946-11-07   Date:10/10/2018       Progress Note  Subjective  Chief Complaint  Chief Complaint  Patient presents with  . Urinary Tract Infection    HPI  PT presents with concern for UTI.  She notes cloudy urine, some lower abdominal tingling.  Denies flank/back pain, frank hematuria, fevers/chills, or N/V.  She brings home test that shows positive Nitrites and Leukocytes, though she is negative for nitrites in our testing today.  We discussed risk/benefit of fluoroquinolone for UTI treatment; she has decreased kidney function and is 72yo, so Macrobid is not recommended and she is allergic to sulfa antibiotics.  No history of kindey stones.  Constipation - we discussed at last visit how this can contribute to recurrent UTI - she notes that she has had constipation with small hard stools - usually has BM daily.  We will start on Miralax once daily.  Patient Active Problem List   Diagnosis Date Noted  . Osteoarthritis of knees, bilateral 05/26/2017  . Primary open angle glaucoma of both eyes 11/21/2016  . Chronic kidney disease (CKD), stage III (moderate) ([place]) 04/06/2016  . Cystitis 10/12/2015  . Benign hypertension 08/21/2015  . Insomnia, persistent 08/21/2015  . Dyslipidemia 08/21/2015  . Gastro-esophageal reflux disease without esophagitis 08/21/2015  . Glaucoma 08/21/2015  . Blood glucose elevated 08/21/2015  . Adult hypothyroidism 08/21/2015  . Climacteric 08/21/2015  . Senile osteoporosis 08/21/2015  . Seasonal allergies 08/21/2015  . Female genuine stress incontinence 08/21/2015  . Vitamin D deficiency 08/21/2015    Social History   Tobacco Use  . Smoking status: Never Smoker  . Smokeless tobacco: Never Used  . Tobacco comment: smoking cessation materials not required  Substance Use Topics  . Alcohol use: No    Alcohol/week: 0.0 standard drinks     Current Outpatient Medications:  .  ALPRAZolam (XANAX) 0.5 MG  tablet, TAKE 1 TABLET(0.5 MG) BY MOUTH AT BEDTIME AS NEEDED FOR ANXIETY. MUST LAST AT LEAST 90 DAYS, Disp: 30 tablet, Rfl: 0 .  aspirin 81 MG tablet, Take 81 mg by mouth daily. pm, Disp: , Rfl:  .  cholecalciferol (VITAMIN D) 1000 units tablet, Take 1,000 Units by mouth 2 (two) times daily. am, Disp: , Rfl:  .  citalopram (CELEXA) 10 MG tablet, Take 1 tablet (10 mg total) by mouth daily., Disp: 90 tablet, Rfl: 1 .  fluticasone (FLONASE) 50 MCG/ACT nasal spray, Place 2 sprays into both nostrils daily. Pm, Disp: 16 g, Rfl: 2 .  Influenza vac split quadrivalent PF (FLUZONE HIGH-DOSE) 0.5 ML injection, , Disp: , Rfl:  .  levocetirizine (XYZAL) 5 MG tablet, Take 1 tablet (5 mg total) by mouth every evening., Disp: 90 tablet, Rfl: 1 .  levothyroxine (SYNTHROID, LEVOTHROID) 50 MCG tablet, TAKE 1 TABLET BY MOUTH EVERY DAY BEFORE BREAKFAST AND TAKE 2 TABLETS ON SUNDAY, Disp: 102 tablet, Rfl: 2 .  losartan (COZAAR) 50 MG tablet, TAKE 1 TABLET(100 MG) BY MOUTH DAILY, Disp: 90 tablet, Rfl: 1 .  Melatonin 3 MG CAPS, Take 1 capsule (3 mg total) by mouth daily., Disp: 90 capsule, Rfl: 0 .  montelukast (SINGULAIR) 10 MG tablet, Take 1 tablet (10 mg total) by mouth daily as needed (allergies)., Disp: 90 tablet, Rfl: 1 .  Omega-3 Fatty Acids (FISH OIL PO), Take by mouth. AM, Disp: , Rfl:  .  pantoprazole (PROTONIX) 40 MG tablet, TAKE 1 TABLET(40 MG) BY MOUTH DAILY, Disp:  90 tablet, Rfl: 1 .  simvastatin (ZOCOR) 40 MG tablet, Take 1 tablet (40 mg total) by mouth daily. pm, Disp: 90 tablet, Rfl: 1 .  timolol (TIMOPTIC) 0.5 % ophthalmic solution, INT 1 GTT IN OU BID, Disp: , Rfl: 5 .  VYZULTA 0.024 % SOLN, INT 1 GTT INTO OU AT NIGHT, Disp: , Rfl: 2 .  zolpidem (AMBIEN) 5 MG tablet, Take 1 tablet (5 mg total) by mouth at bedtime as needed for sleep., Disp: 90 tablet, Rfl: 0  Current Facility-Administered Medications:  .  cyanocobalamin ((VITAMIN B-12)) injection 1,000 mcg, 1,000 mcg, Subcutaneous, Q30 days, Steele Sizer, MD, 1,000 mcg at 10/10/18 1113  Allergies  Allergen Reactions  . Brimonidine Tartrate   . Latanoprost   . Raloxifene     bone pain  . Sulfa Antibiotics Nausea Only    Intolerance rather than allergy.    I personally reviewed active problem list, medication list, allergies, notes from last encounter, lab results with the patient/caregiver today.  ROS  Ten systems reviewed and is negative except as mentioned in HPI  Objective  Vitals:   10/10/18 1107  BP: 116/72  Pulse: 93  Resp: 16  Temp: 98 F (36.7 C)  TempSrc: Oral  SpO2: 99%  Weight: 162 lb (73.5 kg)  Height: 5\' 6"  (1.676 m)   Body mass index is 26.15 kg/m.  Nursing Note and Vital Signs reviewed.  Physical Exam  Constitutional: Patient appears well-developed and well-nourished. No distress.  HEENT: head atraumatic, normocephalic Cardiovascular: Normal rate, regular rhythm and normal heart sounds.  No murmur heard. No BLE edema. Pulmonary/Chest: Effort normal and breath sounds clear bilaterally. No respiratory distress. Abdominal: Soft, bowel sounds normal, there is no tenderness, no HSM. No CVA. Psychiatric: Patient has a normal mood and affect. behavior is normal. Judgment and thought content normal.  Results for orders placed or performed in visit on 10/10/18 (from the past 72 hour(s))  POCT urinalysis dipstick     Status: Abnormal   Collection Time: 10/10/18 11:14 AM  Result Value Ref Range   Color, UA yellow    Clarity, UA cloudy    Glucose, UA Negative Negative   Bilirubin, UA negative    Ketones, UA negative    Spec Grav, UA <=1.005 (A) 1.010 - 1.025   Blood, UA large    pH, UA 5.0 5.0 - 8.0   Protein, UA Positive (A) Negative   Urobilinogen, UA negative (A) 0.2 or 1.0 E.U./dL   Nitrite, UA negative    Leukocytes, UA Large (3+) (A) Negative   Appearance cloudy    Odor none    Assessment & Plan  1. Acute cystitis without hematuria - POCT urinalysis dipstick - ciprofloxacin (CIPRO)  250 MG tablet; Take 1 tablet (250 mg total) by mouth 2 (two) times daily.  Dispense: 6 tablet; Refill: 0 - Urine Culture - See above regarding discussion on Cipro Rx.  - Advised if she develops additional UTI, we will consider referral to urology for recurrent UTI's  2. Chronic constipation - polyethylene glycol powder (MIRALAX) powder; Take 17 g by mouth daily.  Dispense: 500 g; Refill: 0  -Red flags and when to present for emergency care or RTC including fever >101.87F, chest pain, shortness of breath, new/worsening/un-resolving symptoms, flank/abdominal/back pain, nausea/vomiting reviewed with patient at time of visit. Follow up and care instructions discussed and provided in AVS.

## 2018-10-12 LAB — URINE CULTURE
MICRO NUMBER:: 91274866
SPECIMEN QUALITY:: ADEQUATE

## 2018-10-24 DIAGNOSIS — M810 Age-related osteoporosis without current pathological fracture: Secondary | ICD-10-CM | POA: Diagnosis not present

## 2018-11-01 ENCOUNTER — Ambulatory Visit (INDEPENDENT_AMBULATORY_CARE_PROVIDER_SITE_OTHER): Payer: Self-pay

## 2018-11-01 DIAGNOSIS — N3000 Acute cystitis without hematuria: Secondary | ICD-10-CM

## 2018-11-01 LAB — POCT URINALYSIS DIPSTICK
Appearance: NORMAL
Bilirubin, UA: NEGATIVE
Blood, UA: NEGATIVE
Glucose, UA: NEGATIVE
Ketones, UA: NEGATIVE
Leukocytes, UA: NEGATIVE
Nitrite, UA: NEGATIVE
Odor: NORMAL
Protein, UA: NEGATIVE
Spec Grav, UA: 1.01
Urobilinogen, UA: 0.2 U/dL
pH, UA: 7

## 2018-11-19 ENCOUNTER — Telehealth: Payer: Self-pay

## 2018-11-19 ENCOUNTER — Other Ambulatory Visit: Payer: Self-pay | Admitting: Family Medicine

## 2018-11-19 DIAGNOSIS — F411 Generalized anxiety disorder: Secondary | ICD-10-CM

## 2018-11-19 DIAGNOSIS — G4700 Insomnia, unspecified: Secondary | ICD-10-CM

## 2018-11-19 NOTE — Telephone Encounter (Signed)
Pt requesting rf of [employer].She has taken it before & knows that it works. She has already called in into [employer]. It needs a PA. Pt believes this has already been sent to [place]. Requesting this be done so she can get her rx. EZ#[phone]

## 2018-11-22 NOTE — Telephone Encounter (Signed)
Has someone completed the PA? I will send the Rx if it's done.

## 2018-11-23 NOTE — Telephone Encounter (Signed)
I have not either. Was not aware of it

## 2018-11-23 NOTE — Telephone Encounter (Signed)
I have not

## 2018-11-23 NOTE — Telephone Encounter (Signed)
I have not, but we have samples to give her until it is figured out

## 2018-11-28 ENCOUNTER — Other Ambulatory Visit: Payer: Self-pay | Admitting: Advanced Practice Midwife

## 2018-11-28 NOTE — Telephone Encounter (Signed)
PA sent we should receive a fax with a approval or denial

## 2018-11-28 NOTE — Telephone Encounter (Signed)
im doing the PA what is the Dosage and the quantity and days of supply?

## 2018-11-28 NOTE — Telephone Encounter (Signed)
Dose is 6.5 mg vaginal suppository each night at bedtime. Rx 30 suppositories with 11 refills. Thanks Janett Billow

## 2018-12-18 DIAGNOSIS — N390 Urinary tract infection, site not specified: Secondary | ICD-10-CM | POA: Diagnosis not present

## 2018-12-18 DIAGNOSIS — B3749 Other urogenital candidiasis: Secondary | ICD-10-CM | POA: Diagnosis not present

## 2018-12-26 ENCOUNTER — Ambulatory Visit (INDEPENDENT_AMBULATORY_CARE_PROVIDER_SITE_OTHER): Payer: Self-pay

## 2018-12-26 DIAGNOSIS — E538 Deficiency of other specified B group vitamins: Secondary | ICD-10-CM | POA: Insufficient documentation

## 2018-12-26 MED ORDER — CYANOCOBALAMIN 1000 MCG/ML IJ SOLN
1000.0000 ug | Freq: Once | INTRAMUSCULAR | Status: AC
  Administered 2018-12-26: 1000 ug via INTRAMUSCULAR

## 2018-12-26 NOTE — Progress Notes (Signed)
Patient came in for her B-12 injection. She tolerated it well. NKDA.   

## 2019-01-08 DIAGNOSIS — H401132 Primary open-angle glaucoma, bilateral, moderate stage: Secondary | ICD-10-CM | POA: Diagnosis not present

## 2019-01-17 DIAGNOSIS — H401132 Primary open-angle glaucoma, bilateral, moderate stage: Secondary | ICD-10-CM | POA: Diagnosis not present

## 2019-01-26 ENCOUNTER — Other Ambulatory Visit: Payer: Self-pay

## 2019-01-26 ENCOUNTER — Ambulatory Visit
Admission: EM | Admit: 2019-01-26 | Discharge: 2019-01-26 | Disposition: A | Discharge status: Home / Self Care | Payer: PPO | Attending: Family Medicine | Admitting: Family Medicine

## 2019-01-26 DIAGNOSIS — R319 Hematuria, unspecified: Secondary | ICD-10-CM

## 2019-01-26 DIAGNOSIS — N390 Urinary tract infection, site not specified: Secondary | ICD-10-CM | POA: Diagnosis not present

## 2019-01-26 DIAGNOSIS — N760 Acute vaginitis: Secondary | ICD-10-CM

## 2019-01-26 DIAGNOSIS — I10 Essential (primary) hypertension: Secondary | ICD-10-CM | POA: Diagnosis not present

## 2019-01-26 LAB — URINALYSIS, COMPLETE (UACMP) WITH MICROSCOPIC
Bilirubin Urine: NEGATIVE
Glucose, UA: NEGATIVE mg/dL
Ketones, ur: NEGATIVE mg/dL
Nitrite: NEGATIVE
Protein, ur: NEGATIVE mg/dL
Specific Gravity, Urine: 1.01 (ref 1.005–1.030)
WBC, UA: >50 WBC/hpf (ref 0–5)
pH: 7 (ref 5.0–8.0)

## 2019-01-26 MED ORDER — CEPHALEXIN 500 MG PO CAPS: 500.0000 mg | ORAL_CAPSULE | Freq: Two times a day (BID) | ORAL | 0 refills | Status: AC

## 2019-01-26 MED ORDER — FLUCONAZOLE 150 MG PO TABS: 150.0000 mg | ORAL_TABLET | Freq: Every day | ORAL | 0 refills | Status: DC

## 2019-01-26 NOTE — ED Provider Notes (Signed)
[facility] ____________________________________________  Time seen: Approximately 2:42 PM  I have reviewed the triage vital signs and the nursing notes.   HISTORY  Chief Complaint Dysuria   HPI Dawn Fuentes is a 73 y.o. female presented for evaluation of 2 days of urinary frequency, urinary urgency and burning with urination.  States feels similar to previous urinary tract infections.  Does also report some vaginal irritation.  Denies discharge.  Unresolved with over-the-counter topical Monistat cream.  History of similar for irritation as well as dysuria.  Denies abdominal pain, back pain, fevers, chest pain, shortness of breath.  Denies recent sickness.  Steele Sizer, MD: PCP  Past Medical History:  Diagnosis Date  . Allergy   . Anxiety    ER visit in Sept 2017 for anxiety  . Arthritis   . Chronic insomnia   . Colon adenoma   . Dyslipidemia   . Dysphagia   . Female stress incontinence   . GERD (gastroesophageal reflux disease)   . [facility], both eyes    [facility]  . Hyperlipidemia   . Hypertension    controlled on meds  . Hypothyroidism   . Menopause syndrome   . Osteoporosis   . Polyp of colon   . Renal insufficiency    mild- keeping an eye on  . Vitamin D deficiency     Patient Active Problem List   Diagnosis Date Noted  . [name] deficiency 12/26/2018  . Osteoarthritis of knees, bilateral 05/26/2017  . Primary open angle glaucoma of both eyes 11/21/2016  . Chronic kidney disease (CKD), stage III (moderate) ([place]) 04/06/2016  . Cystitis 10/12/2015  . Benign hypertension 08/21/2015  . Insomnia, persistent 08/21/2015  . Dyslipidemia 08/21/2015  . Gastro-esophageal reflux disease without esophagitis 08/21/2015  . Glaucoma 08/21/2015  . Blood glucose elevated 08/21/2015  . Adult hypothyroidism 08/21/2015  . Climacteric 08/21/2015  . Senile osteoporosis 08/21/2015  . Seasonal allergies 08/21/2015  . Female genuine  stress incontinence 08/21/2015  . Vitamin D deficiency 08/21/2015    Past Surgical History:  Procedure Laterality Date  . ABDOMINAL HYSTERECTOMY     21 years ago  . APPENDECTOMY    . COLONOSCOPY WITH PROPOFOL N/A 05/29/2015   Procedure: COLONOSCOPY WITH PROPOFOL;  Surgeon: Hulen Luster, MD;  Location[facility] ENDOSCOPY;  Service: Gastroenterology;  Laterality: N/A;  . ESOPHAGOGASTRODUODENOSCOPY N/A 05/29/2015   Procedure: ESOPHAGOGASTRODUODENOSCOPY (EGD);  Surgeon: Hulen Luster, MD;  Location[facility] ENDOSCOPY;  Service: Gastroenterology;  Laterality: N/A;  . EYE SURGERY     eye lids  . OVARIAN CYST REMOVAL     needed transfusion  . PHOTOCOAGULATION WITH LASER Right 11/21/2016   Procedure: PHOTOCOAGULATION WITH LASER;  Surgeon: Ronnell Freshwater, MD;  Location: [place];  Service: Ophthalmology;  Laterality: Right;  A micropulse probe was applied to each hemilimbus with the following settings: 2057mW, 31.3% duty cycle, 90 seconds x 3 applications.       Current Facility-Administered Medications:  .  cyanocobalamin ((VITAMIN B-12)) injection 1,000 mcg, 1,000 mcg, Subcutaneous, Q30 days, Steele Sizer, MD, 1,000 mcg at 10/10/18 1113  Current Outpatient Medications:  .  ALPRAZolam (XANAX) 0.5 MG tablet, TAKE 1 TABLET(0.5 MG) BY MOUTH AT BEDTIME AS NEEDED FOR ANXIETY. MUST LAST 90 DAYS AT LEAST, Disp: 10 tablet, Rfl: 0 .  aspirin 81 MG tablet, Take 81 mg by mouth daily. pm, Disp: , Rfl:  .  cephALEXin (KEFLEX) 500 MG capsule, Take 1 capsule (500 mg total) by mouth  2 (two) times daily for 7 days., Disp: 14 capsule, Rfl: 0 .  cholecalciferol (VITAMIN D) 1000 units tablet, Take 1,000 Units by mouth 2 (two) times daily. am, Disp: , Rfl:  .  citalopram (CELEXA) 10 MG tablet, Take 1 tablet (10 mg total) by mouth daily., Disp: 90 tablet, Rfl: 1 .  fluconazole (DIFLUCAN) 150 MG tablet, Take 1 tablet (150 mg total) by mouth daily. Take one pill orally once, Disp: 1 tablet, Rfl: 0 .   fluticasone (FLONASE) 50 MCG/ACT nasal spray, Place 2 sprays into both nostrils daily. Pm, Disp: 16 g, Rfl: 2 .  Influenza vac split quadrivalent PF (FLUZONE HIGH-DOSE) 0.5 ML injection, , Disp: , Rfl:  .  levocetirizine (XYZAL) 5 MG tablet, Take 1 tablet (5 mg total) by mouth every evening., Disp: 90 tablet, Rfl: 1 .  levothyroxine (SYNTHROID, LEVOTHROID) 50 MCG tablet, TAKE 1 TABLET BY MOUTH EVERY DAY BEFORE BREAKFAST AND TAKE 2 TABLETS ON SUNDAY, Disp: 102 tablet, Rfl: 2 .  losartan (COZAAR) 50 MG tablet, TAKE 1 TABLET(100 MG) BY MOUTH DAILY, Disp: 90 tablet, Rfl: 1 .  Melatonin 3 MG CAPS, Take 1 capsule (3 mg total) by mouth daily., Disp: 90 capsule, Rfl: 0 .  montelukast (SINGULAIR) 10 MG tablet, Take 1 tablet (10 mg total) by mouth daily as needed (allergies)., Disp: 90 tablet, Rfl: 1 .  Omega-3 Fatty Acids (FISH OIL PO), Take by mouth. AM, Disp: , Rfl:  .  pantoprazole (PROTONIX) 40 MG tablet, TAKE 1 TABLET(40 MG) BY MOUTH DAILY, Disp: 90 tablet, Rfl: 1 .  simvastatin (ZOCOR) 40 MG tablet, Take 1 tablet (40 mg total) by mouth daily. pm, Disp: 90 tablet, Rfl: 1 .  timolol (TIMOPTIC) 0.5 % ophthalmic solution, INT 1 GTT IN OU BID, Disp: , Rfl: 5 .  VYZULTA 0.024 % SOLN, INT 1 GTT INTO OU AT NIGHT, Disp: , Rfl: 2 .  zolpidem (AMBIEN) 5 MG tablet, TAKE 1 TABLET(5 MG) BY MOUTH AT BEDTIME AS NEEDED FOR SLEEP, Disp: 90 tablet, Rfl: 0  Allergies Brimonidine tartrate; Latanoprost; Raloxifene; and Sulfa antibiotics  Family History  Problem Relation Age of Onset  . Hypertension Mother   . Osteoporosis Mother   . COPD Mother   . Dementia Mother   . Cancer Father        Colon  . Hypothyroidism Daughter   . Aneurysm Brother     Social History Social History   Tobacco Use  . Smoking status: Never Smoker  . Smokeless tobacco: Never Used  . Tobacco comment: smoking cessation materials not required  Substance Use Topics  . Alcohol use: No    Alcohol/week: 0.0 standard drinks  . Drug use:  No    Review of Systems Constitutional: No fever Cardiovascular: Denies chest pain. Respiratory: Denies shortness of breath. Gastrointestinal: No abdominal pain.  No nausea, no vomiting.  No diarrhea.   Genitourinary: Positive for dysuria. Musculoskeletal: Negative for back pain. Skin: Negative for rash.  ____________________________________________   PHYSICAL EXAM:  VITAL SIGNS: ED Triage Vitals  Enc Vitals Group     BP 01/26/19 1255 (!) 150/89     Pulse Rate 01/26/19 1255 77     Resp 01/26/19 1255 16     Temp 01/26/19 1255 98 F (36.7 C)     Temp Source 01/26/19 1255 Oral     SpO2 01/26/19 1255 98 %     Weight 01/26/19 1256 166 lb (75.3 kg)     Height 01/26/19 1256 5\' 5"  (1.651  m)     Head Circumference --      Peak Flow --      Pain Score 01/26/19 1256 2     Pain Loc --      Pain Edu? --      Excl. in [place]? --     Constitutional: Alert and oriented. Well appearing and in no acute distress. ENT      Head: Normocephalic and atraumatic. Cardiovascular: Normal rate, regular rhythm. Grossly normal heart sounds.  Good peripheral circulation. Respiratory: Normal respiratory effort without tachypnea nor retractions. Breath sounds are clear and equal bilaterally. No wheezes, rales, rhonchi. Gastrointestinal: Soft and nontender.No CVA tenderness. Pelvic: Patient declined chaperone.  External only visualization exam performed.  Mild external vulvar irritation erythema noted, skin appears intact, no discharge, no surrounding erythema. Musculoskeletal:  No midline cervical, thoracic or lumbar tenderness to palpation.  Neurologic:  Normal speech and language. No gross focal neurologic deficits are appreciated. Speech is normal. No gait instability.  Skin:  Skin is warm, dry and intact. No rash noted. Psychiatric: Mood and affect are normal. Speech and behavior are normal. Patient exhibits appropriate insight and judgment   ___________________________________________   LABS (all  labs ordered are listed, but only abnormal results are displayed)  Labs Reviewed  URINALYSIS, COMPLETE (UACMP) WITH MICROSCOPIC - Abnormal; Notable for the following components:      Result Value   APPearance HAZY (*)    Hgb urine dipstick SMALL (*)    Leukocytes, UA LARGE (*)    Bacteria, UA FEW (*)    All other components within normal limits  URINE CULTURE   Creatinine clearance calculated from most recent labs in care everywhere, creatinine clearance 62. ____________________________________________   PROCEDURES Procedures   INITIAL IMPRESSION / ASSESSMENT AND PLAN / ED COURSE  Pertinent labs & imaging results that were available during my care of the patient were reviewed by me and considered in my medical decision making (see chart for details).  Well-appearing patient.  No acute distress.  Urinalysis reviewed, suspect UTI.  Also concern for irritant vaginitis, will empirically treat with Diflucan.  No appearance of cellulitis.  Encourage patient to follow-up closely with her primary care as some symptoms may be related to vaginal atrophy versus wearing pad for stress incontinence and localized irritation.  Encourage supportive care, allow open air time.Discussed indication, risks and benefits of medications with patient.  Information for local urology given due to patient reports recurrent UTIs.  Discussed follow up with Primary care physician this week. Discussed follow up and return parameters including no resolution or any worsening concerns. Patient verbalized understanding and agreed to plan.   ____________________________________________   FINAL CLINICAL IMPRESSION(S) / ED DIAGNOSES  Final diagnoses:  Urinary tract infection with hematuria, site unspecified  Acute vaginitis     ED Discharge Orders         Ordered    fluconazole (DIFLUCAN) 150 MG tablet  Daily     01/26/19 1414    cephALEXin (KEFLEX) 500 MG capsule  2 times daily     01/26/19 1414            Note: This dictation was prepared with Dragon dictation along with smaller phrase technology. Any transcriptional errors that result from this process are unintentional.         Marylene Land, NP 01/26/19 1447

## 2019-01-26 NOTE — ED Triage Notes (Addendum)
Pt with cloudy urine and burning with urination starting today. Also with irritation to urethra

## 2019-01-26 NOTE — Discharge Instructions (Addendum)
Take medication as prescribed. Rest. Drink plenty of fluids. Monitor.   Follow up with your primary care physician this week as needed.  Follow-up with urology as discussed.  Return to Urgent care for new or worsening concerns.

## 2019-01-28 LAB — URINE CULTURE: Culture: <10000 — AB

## 2019-01-30 ENCOUNTER — Ambulatory Visit (INDEPENDENT_AMBULATORY_CARE_PROVIDER_SITE_OTHER): Payer: Self-pay

## 2019-01-30 DIAGNOSIS — E538 Deficiency of other specified B group vitamins: Secondary | ICD-10-CM

## 2019-01-30 NOTE — Progress Notes (Signed)
Patient came in for her B-12 injection. She tolerated it well. NKDA.   

## 2019-02-16 ENCOUNTER — Other Ambulatory Visit: Payer: Self-pay | Admitting: Family Medicine

## 2019-02-16 DIAGNOSIS — F411 Generalized anxiety disorder: Secondary | ICD-10-CM

## 2019-02-16 DIAGNOSIS — G4700 Insomnia, unspecified: Secondary | ICD-10-CM

## 2019-02-18 NOTE — Telephone Encounter (Signed)
Refill request for general medication: Xanax 0.5 mg  Zolpidem 5 mg  Last office visit: 10/10/2018  Last physical exam: 03/13/2018  No follow-ups on file. 03/12/2019

## 2019-02-27 ENCOUNTER — Other Ambulatory Visit: Payer: Self-pay

## 2019-02-27 ENCOUNTER — Ambulatory Visit (INDEPENDENT_AMBULATORY_CARE_PROVIDER_SITE_OTHER): Payer: PPO

## 2019-02-27 DIAGNOSIS — E538 Deficiency of other specified B group vitamins: Secondary | ICD-10-CM | POA: Diagnosis not present

## 2019-02-27 NOTE — Progress Notes (Signed)
Patient came in for her B-12 injection. She tolerated it well. NKDA.   

## 2019-03-12 ENCOUNTER — Ambulatory Visit: Payer: PPO | Admitting: Family Medicine

## 2019-03-20 ENCOUNTER — Other Ambulatory Visit: Payer: Self-pay | Admitting: Family Medicine

## 2019-03-20 DIAGNOSIS — K219 Gastro-esophageal reflux disease without esophagitis: Secondary | ICD-10-CM

## 2019-03-20 NOTE — Telephone Encounter (Signed)
Refill request for general medication. Protonix to [facility].   Last office visit 10/10/2018   Follow up on 04/30/2019

## 2019-04-19 ENCOUNTER — Other Ambulatory Visit: Payer: Self-pay | Admitting: Family Medicine

## 2019-04-19 DIAGNOSIS — I10 Essential (primary) hypertension: Secondary | ICD-10-CM

## 2019-04-19 DIAGNOSIS — F411 Generalized anxiety disorder: Secondary | ICD-10-CM

## 2019-04-29 DIAGNOSIS — M810 Age-related osteoporosis without current pathological fracture: Secondary | ICD-10-CM | POA: Diagnosis not present

## 2019-04-30 ENCOUNTER — Ambulatory Visit (INDEPENDENT_AMBULATORY_CARE_PROVIDER_SITE_OTHER): Payer: PPO | Admitting: Family Medicine

## 2019-04-30 ENCOUNTER — Encounter: Payer: Self-pay | Admitting: Family Medicine

## 2019-04-30 ENCOUNTER — Other Ambulatory Visit: Payer: Self-pay

## 2019-04-30 DIAGNOSIS — E538 Deficiency of other specified B group vitamins: Secondary | ICD-10-CM

## 2019-04-30 DIAGNOSIS — N183 Chronic kidney disease, stage 3 unspecified: Secondary | ICD-10-CM

## 2019-04-30 DIAGNOSIS — E559 Vitamin D deficiency, unspecified: Secondary | ICD-10-CM

## 2019-04-30 DIAGNOSIS — F411 Generalized anxiety disorder: Secondary | ICD-10-CM

## 2019-04-30 DIAGNOSIS — J3089 Other allergic rhinitis: Secondary | ICD-10-CM | POA: Diagnosis not present

## 2019-04-30 DIAGNOSIS — E785 Hyperlipidemia, unspecified: Secondary | ICD-10-CM

## 2019-04-30 DIAGNOSIS — E038 Other specified hypothyroidism: Secondary | ICD-10-CM | POA: Diagnosis not present

## 2019-04-30 DIAGNOSIS — G4700 Insomnia, unspecified: Secondary | ICD-10-CM

## 2019-04-30 DIAGNOSIS — N3946 Mixed incontinence: Secondary | ICD-10-CM | POA: Diagnosis not present

## 2019-04-30 DIAGNOSIS — J302 Other seasonal allergic rhinitis: Secondary | ICD-10-CM

## 2019-04-30 DIAGNOSIS — K219 Gastro-esophageal reflux disease without esophagitis: Secondary | ICD-10-CM | POA: Diagnosis not present

## 2019-04-30 DIAGNOSIS — I10 Essential (primary) hypertension: Secondary | ICD-10-CM | POA: Diagnosis not present

## 2019-04-30 MED ORDER — LEVOTHYROXINE SODIUM 50 MCG PO TABS: ORAL_TABLET | ORAL | 1 refills | Status: DC

## 2019-04-30 MED ORDER — CITALOPRAM HYDROBROMIDE 10 MG PO TABS: 10.0000 mg | ORAL_TABLET | Freq: Every day | ORAL | 0 refills | Status: DC

## 2019-04-30 MED ORDER — SIMVASTATIN 40 MG PO TABS: 40.0000 mg | ORAL_TABLET | Freq: Every day | ORAL | 1 refills | Status: DC

## 2019-04-30 MED ORDER — MIRABEGRON ER 25 MG PO TB24: 25.0000 mg | ORAL_TABLET | Freq: Every day | ORAL | 0 refills | Status: DC

## 2019-04-30 MED ORDER — PANTOPRAZOLE SODIUM 40 MG PO TBEC: 40.0000 mg | DELAYED_RELEASE_TABLET | Freq: Every day | ORAL | 1 refills | Status: DC

## 2019-04-30 MED ORDER — MONTELUKAST SODIUM 10 MG PO TABS: 10.0000 mg | ORAL_TABLET | Freq: Every day | ORAL | 1 refills | Status: DC | PRN

## 2019-04-30 MED ORDER — LEVOCETIRIZINE DIHYDROCHLORIDE 5 MG PO TABS: 5.0000 mg | ORAL_TABLET | Freq: Every evening | ORAL | 1 refills | Status: DC

## 2019-04-30 MED ORDER — ZOLPIDEM TARTRATE 5 MG PO TABS: ORAL_TABLET | ORAL | 0 refills | Status: DC

## 2019-04-30 MED ORDER — LOSARTAN POTASSIUM 50 MG PO TABS: ORAL_TABLET | ORAL | 0 refills | Status: DC

## 2019-04-30 NOTE — Progress Notes (Signed)
Name: Dawn Fuentes   MRN: [id number]    DOB: 06/09/1946   Date:04/30/2019       Progress Note  Subjective  Chief Complaint  Chief Complaint  Patient presents with  . Hypertension  . Hypothyroidism  . Insomnia  . Dyslipidemia  . Urinary Incontinence    continues to be about the same she has to use a pad all the time. Thinks she wants to be on medication for it.    I connected with  [name]  on 04/30/19 at 10:40 AM EDT by a video enabled telemedicine application and verified that I am speaking with the correct person using two identifiers.  I discussed the limitations of evaluation and management by telemedicine and the availability of in person appointments. The patient expressed understanding and agreed to proceed. Staff also discussed with the patient that there may be a patient responsible charge related to this service. Patient Location: at home Provider Location: [facility]   HPI  GAD: she is feeling much better on Citalopram 10 mg,she is very seldom taking alprazolam, last BZD was filled in March and still has 5 left.   HTN: bp at home is 120's Currently on Losartan 50 mg and no side effects. She denies chest pain or palpitation, denies edema, no dizziness. Unchanged   CKI: we will monitor, sheisavoiding takingNSAID's . Only taking Tylenol prn We will recheck it today Denies pruritis   OA knee: she has noticed worsening of pain on right knee when going up and down stairs and when first standing up from sitting position, both knees but right is worse than left. No effusion or redness, and is no limiting her activity level. She went to Ortho, had some PT and was feeling better, still has pain going up and down stairs, Tumeric is helping  Insomnia: she is taking Ambien 5 mg at night, it is helping her fall and stay asleep  Hypothyroidism: no palpitation, change in bowel movements or dysphagia. Taking medication daily as prescribed. Last TSH  at goal   GERD: unable to wean self off PPI, she is back on it daily, no heartburn of indigestion as long as she takes medication. Discussed long term risk of medication again, but unable to stop PPI   Osteoporosis: she took fosamax long time ago, and is afraid to go back on therapy, she had Prolia yesterday   AR: she has intermittent symptoms, she noticed some nasal congestion, no itching or rhinorrhea, using nasal steroid, she will resume xyzal and montelukast at this time Stable  Recurrent UTI: she was treated October at our office and went to Urgent Care Feb 2020. She denies dysuria at this time but has symptoms of stress incontinence, occasionally also has incontinence going to the bathrooms.  Patient Active Problem List   Diagnosis Date Noted  . [name] deficiency 12/26/2018  . Osteoarthritis of knees, bilateral 05/26/2017  . Primary open angle glaucoma of both eyes 11/21/2016  . Chronic kidney disease (CKD), stage III (moderate) ([place]) 04/06/2016  . Cystitis 10/12/2015  . Benign hypertension 08/21/2015  . Insomnia, persistent 08/21/2015  . Dyslipidemia 08/21/2015  . Gastro-esophageal reflux disease without esophagitis 08/21/2015  . Glaucoma 08/21/2015  . Blood glucose elevated 08/21/2015  . Adult hypothyroidism 08/21/2015  . Climacteric 08/21/2015  . Senile osteoporosis 08/21/2015  . Seasonal allergies 08/21/2015  . Female genuine stress incontinence 08/21/2015  . Vitamin D deficiency 08/21/2015    Past Surgical History:  Procedure Laterality Date  .  ABDOMINAL HYSTERECTOMY     21 years ago  . APPENDECTOMY    . COLONOSCOPY WITH PROPOFOL N/A 05/29/2015   Procedure: COLONOSCOPY WITH PROPOFOL;  Surgeon: Hulen Luster, MD;  Location: [facility] ENDOSCOPY;  Service: Gastroenterology;  Laterality: N/A;  . ESOPHAGOGASTRODUODENOSCOPY N/A 05/29/2015   Procedure: ESOPHAGOGASTRODUODENOSCOPY (EGD);  Surgeon: Hulen Luster, MD;  [facility] ENDOSCOPY;  Service: Gastroenterology;  Laterality: N/A;   . EYE SURGERY     eye lids  . OVARIAN CYST REMOVAL     needed transfusion  . PHOTOCOAGULATION WITH LASER Right 11/21/2016   Procedure: PHOTOCOAGULATION WITH LASER;  Surgeon: Ronnell Freshwater, MD;  Location: [place];  Service: Ophthalmology;  Laterality: Right;  A micropulse probe was applied to each hemilimbus with the following settings: 2031mW, 31.3% duty cycle, 90 seconds x 3 applications.     Family History  Problem Relation Age of Onset  . Hypertension Mother   . Osteoporosis Mother   . COPD Mother   . Dementia Mother   . Cancer Father        Colon  . Hypothyroidism Daughter   . Aneurysm Brother     Social History   Socioeconomic History  . Marital status: Widowed    Spouse name: Shanon Brow  . Number of children: 1  . Years of education: Not on file  . Highest education level: 12th grade  Occupational History  . Occupation: Retired  Scientific laboratory technician  . Financial resource strain: Not hard at all  . Food insecurity:    Worry: Never true    Inability: Never true  . Transportation needs:    Medical: No    Non-medical: No  Tobacco Use  . Smoking status: Never Smoker  . Smokeless tobacco: Never Used  . Tobacco comment: smoking cessation materials not required  Substance and Sexual Activity  . Alcohol use: No    Alcohol/week: 0.0 standard drinks  . Drug use: No  . Sexual activity: Not Currently  Lifestyle  . Physical activity:    Days per week: 3 days    Minutes per session: 60 min  . Stress: Not at all  Relationships  . Social connections:    Talks on phone: Patient refused    Gets together: Patient refused    Attends religious service: Patient refused    Active member of club or organization: Patient refused    Attends meetings of clubs or organizations: Patient refused    Relationship status: Widowed  . Intimate partner violence:    Fear of current or ex partner: No    Emotionally abused: No    Physically abused: No    Forced sexual  activity: No  Other Topics Concern  . Not on file  Social History Narrative   Widow since 2013   Started to date in 2019     Current Outpatient Medications:  .  ALPRAZolam (XANAX) 0.5 MG tablet, TAKE 1 TABLET BY MOUTH AT BEDTIME AS NEEDED FOR ANXIETY(MUST LAST 90 DAYS), Disp: 10 tablet, Rfl: 0 .  aspirin 81 MG tablet, Take 81 mg by mouth daily. pm, Disp: , Rfl:  .  cholecalciferol (VITAMIN D) 1000 units tablet, Take 1,000 Units by mouth 2 (two) times daily. am, Disp: , Rfl:  .  citalopram (CELEXA) 10 MG tablet, TAKE 1 TABLET(10 MG) BY MOUTH DAILY, Disp: 90 tablet, Rfl: 0 .  denosumab (PROLIA) 60 MG/ML SOSY injection, Inject into the skin., Disp: , Rfl:  .  fluticasone (FLONASE) 50 MCG/ACT nasal spray,  Place 2 sprays into both nostrils daily. Pm, Disp: 16 g, Rfl: 2 .  Influenza vac split quadrivalent PF (FLUZONE HIGH-DOSE) 0.5 ML injection, , Disp: , Rfl:  .  INTRAROSA 6.5 MG INST, I 1 SUP VAG HS, Disp: , Rfl:  .  levocetirizine (XYZAL) 5 MG tablet, Take 1 tablet (5 mg total) by mouth every evening., Disp: 90 tablet, Rfl: 1 .  levothyroxine (SYNTHROID, LEVOTHROID) 50 MCG tablet, TAKE 1 TABLET BY MOUTH EVERY DAY BEFORE BREAKFAST AND TAKE 2 TABLETS ON SUNDAY, Disp: 102 tablet, Rfl: 2 .  losartan (COZAAR) 50 MG tablet, TAKE 1 TABLET BY MOUTH DAILY, Disp: 90 tablet, Rfl: 0 .  Melatonin 3 MG CAPS, Take 1 capsule (3 mg total) by mouth daily., Disp: 90 capsule, Rfl: 0 .  montelukast (SINGULAIR) 10 MG tablet, Take 1 tablet (10 mg total) by mouth daily as needed (allergies)., Disp: 90 tablet, Rfl: 1 .  Omega-3 Fatty Acids (FISH OIL PO), Take by mouth. AM, Disp: , Rfl:  .  pantoprazole (PROTONIX) 40 MG tablet, TAKE 1 TABLET(40 MG) BY MOUTH DAILY, Disp: 90 tablet, Rfl: 0 .  simvastatin (ZOCOR) 40 MG tablet, Take 1 tablet (40 mg total) by mouth daily. pm, Disp: 90 tablet, Rfl: 1 .  timolol (TIMOPTIC) 0.5 % ophthalmic solution, INT 1 GTT IN OU BID, Disp: , Rfl: 5 .  VYZULTA 0.024 % SOLN, INT 1 GTT INTO  OU AT NIGHT, Disp: , Rfl: 2 .  zolpidem (AMBIEN) 5 MG tablet, TAKE 1 TABLET(5 MG) BY MOUTH AT BEDTIME AS NEEDED FOR SLEEP, Disp: 90 tablet, Rfl: 0 .  fluconazole (DIFLUCAN) 150 MG tablet, Take 1 tablet (150 mg total) by mouth daily. Take one pill orally once (Patient not taking: Reported on 04/30/2019), Disp: 1 tablet, Rfl: 0  Current Facility-Administered Medications:  .  cyanocobalamin ((VITAMIN B-12)) injection 1,000 mcg, 1,000 mcg, Subcutaneous, Q30 days, Steele Sizer, MD, 1,000 mcg at 02/27/19 1143  Allergies  Allergen Reactions  . Brimonidine Tartrate   . Latanoprost   . Raloxifene     bone pain  . Sulfa Antibiotics Nausea Only    Intolerance rather than allergy.    I personally reviewed active problem list, medication list, allergies, family history, social history with the patient/caregiver today.   ROS  Ten systems reviewed and is negative except as mentioned in HPI   Objective  Virtual encounter, vitals not obtained.  There is no height or weight on file to calculate BMI.  Physical Exam  Awake, alert and oriented, well groomed  PHQ2/9: Depression screen [facility] 2/9 04/30/2019 10/10/2018 09/11/2018 07/11/2018 03/13/2018  Decreased Interest 0 0 0 0 0  Down, Depressed, Hopeless 0 0 0 0 0  PHQ - 2 Score 0 0 0 0 0  Altered sleeping 3 0 2 1 0  Tired, decreased energy 0 0 0 0 0  Change in appetite 0 0 0 0 0  Feeling bad or failure about yourself  0 0 0 0 0  Trouble concentrating 0 0 0 0 0  Moving slowly or fidgety/restless 0 0 0 0 0  Suicidal thoughts 0 0 0 0 0  PHQ-9 Score 3 0 2 1 0  Difficult doing work/chores Not difficult at all Not difficult at all Not difficult at all Not difficult at all Not difficult at all   PHQ-2/9 Result is negative.    Fall Risk: Fall Risk  04/30/2019 10/10/2018 09/11/2018 07/11/2018 03/13/2018  Falls in the past year? 0 No No No No  Number falls in past yr: 0 - - - -  Injury with Fall? 0 - - - -  Risk for fall due to : - - - - Impaired  vision  Risk for fall due to: Comment - - - - wears eyeglasses     Assessment & Plan   1. CKD (chronic kidney disease), stage III (HCC)  - COMPLETE METABOLIC PANEL WITH GFR - CBC with Differential/Platelet  2. GAD (generalized anxiety disorder)   3. Other specified hypothyroidism  - levothyroxine (SYNTHROID) 50 MCG tablet; TAKE 1 TABLET BY MOUTH EVERY DAY BEFORE BREAKFAST AND TAKE 2 TABLETS ON SUNDAY  Dispense: 102 tablet; Refill: 1 - TSH  4. Perennial allergic rhinitis with seasonal variation  - levocetirizine (XYZAL) 5 MG tablet; Take 1 tablet (5 mg total) by mouth every evening.  Dispense: 90 tablet; Refill: 1 - montelukast (SINGULAIR) 10 MG tablet; Take 1 tablet (10 mg total) by mouth daily as needed (allergies).  Dispense: 90 tablet; Refill: 1  5. Gastro-esophageal reflux disease without esophagitis  - pantoprazole (PROTONIX) 40 MG tablet; Take 1 tablet (40 mg total) by mouth daily.  Dispense: 90 tablet; Refill: 1  6. Dyslipidemia  - simvastatin (ZOCOR) 40 MG tablet; Take 1 tablet (40 mg total) by mouth daily. pm  Dispense: 90 tablet; Refill: 1 - Lipid panel  7. Insomnia, persistent  - zolpidem (AMBIEN) 5 MG tablet; TAKE 1 TABLET(5 MG) BY MOUTH AT BEDTIME AS NEEDED FOR SLEEP  Dispense: 90 tablet; Refill: 0  8. [name] deficiency  - [name] and Folate Panel  9. Benign hypertension  - losartan (COZAAR) 50 MG tablet; TAKE 1 TABLET BY MOUTH DAILY  Dispense: 90 tablet; Refill: 0  10. Mixed incontinence urge and stress  - mirabegron ER (MYRBETRIQ) 25 MG TB24 tablet; Take 1 tablet (25 mg total) by mouth daily.  Dispense: 30 tablet; Refill: 0 - Ambulatory referral to Physical Therapy  11. Vitamin D deficiency  - VITAMIN D 25 Hydroxy (Vit-D Deficiency, Fractures)   I discussed the assessment and treatment plan with the patient. The patient was provided an opportunity to ask questions and all were answered. The patient agreed with the plan and demonstrated an understanding  of the instructions.  The patient was advised to call back or seek an in-person evaluation if the symptoms worsen or if the condition fails to improve as anticipated.  I provided 25  minutes of non-face-to-face time during this encounter.

## 2019-05-02 ENCOUNTER — Ambulatory Visit: Payer: Self-pay

## 2019-05-02 NOTE — Telephone Encounter (Signed)
Pt c/o pain to her right little toe that goes to the back of her calf. Pt stated pain is moderate. Pt denies redness or edema. Pt stated there is no pain in a sitting position only when she stands. Pt thinks it could be arthritis or sciatica. Pt denies other symptoms such as chest pain, back pain, difficulty breathing, swelling, rash, fever, numbness or weakness. Care advice given and pt verbalized understanding. Pt warm transferred to office for virtual appt.  Reason for Disposition . [1] MODERATE pain (e.g., interferes with normal activities, limping) AND [2] present > 3 days  Answer Assessment - Initial Assessment Questions 1. ONSET: "When did the pain start?"      Tuesday afternoon 2. LOCATION: "Where is the pain located Right little toe up to back of calf  3. PAIN: "How bad is the pain?"    (Scale 1-10; or mild, moderate, severe)   -  MILD (1-3): doesn't interfere with normal activities    -  MODERATE (4-7): interferes with normal activities (e.g., work or school) or awakens from sleep, limping    -  SEVERE (8-10): excruciating pain, unable to do any normal activities, unable to walk     Moderate 4. WORK OR EXERCISE: "Has there been any recent work or exercise that involved this part of the body?"      no 5. CAUSE: "What do you think is causing the leg pain?"     Not sure what's causing pain- ? Arthritis or a pinched.  6. OTHER SYMPTOMS: "Do you have any other symptoms?" (e.g., chest pain, back pain, breathing difficulty, swelling, rash, fever, numbness, weakness)     no 7. PREGNANCY: "Is there any chance you are pregnant?" "When was your last menstrual period?"     no  Protocols used: LEG PAIN-A-AH

## 2019-05-03 ENCOUNTER — Ambulatory Visit (INDEPENDENT_AMBULATORY_CARE_PROVIDER_SITE_OTHER): Payer: PPO | Admitting: Family Medicine

## 2019-05-03 ENCOUNTER — Other Ambulatory Visit: Payer: Self-pay

## 2019-05-03 ENCOUNTER — Encounter: Payer: Self-pay | Admitting: Family Medicine

## 2019-05-03 DIAGNOSIS — M79671 Pain in right foot: Secondary | ICD-10-CM | POA: Diagnosis not present

## 2019-05-03 NOTE — Patient Instructions (Signed)
Achilles Tendinitis  Achilles tendinitis is inflammation of the tough, cord-like band that attaches the lower leg muscles to the heel bone (Achilles tendon). This is usually caused by overusing the tendon and the ankle joint. Achilles tendinitis usually gets better over time with treatment and caring for yourself at home. It can take weeks or months to heal completely. What are the causes? This condition may be caused by:  A sudden increase in exercise or activity, such as running.  Doing the same exercises or activities (such as jumping) over and over.  Not warming up calf muscles before exercising.  Exercising in shoes that are worn out or not made for exercise.  Having arthritis or a bone growth (spur) on the back of the heel bone. This can rub against the tendon and hurt it.  Age-related wear and tear. Tendons become less flexible with age and more likely to be injured. What are the signs or symptoms? Common symptoms of this condition include:  Pain in the Achilles tendon or in the back of the leg, just above the heel. The pain usually gets worse with exercise.  Stiffness or soreness in the back of the leg, especially in the morning.  Swelling of the skin over the Achilles tendon.  Thickening of the tendon.  Bone spurs at the bottom of the Achilles tendon, near the heel.  Trouble standing on tiptoe. How is this diagnosed? This condition is diagnosed based on your symptoms and a physical exam. You may have tests, including:  X-rays.  MRI. How is this treated? The goal of treatment is to relieve symptoms and help your injury heal. Treatment may include:  Decreasing or stopping activities that caused the tendinitis. This may mean switching to low-impact exercises like biking or swimming.  Icing the injured area.  Doing physical therapy, including strengthening and stretching exercises.  NSAIDs to help relieve pain and swelling.  Using supportive shoes, wraps, heel  lifts, or a walking boot (air cast).  Surgery. This may be done if your symptoms do not improve after 6 months.  Using high-energy shock wave impulses to stimulate the healing process (extracorporeal shock wave therapy). This is rare.  Injection of medicines to help relieve inflammation (corticosteroids). This is rare. Follow these instructions at home: If you have an air cast:  Wear the cast as told by your health care provider. Remove it only as told by your health care provider.  Loosen the cast if your toes tingle, become numb, or turn cold and blue. Activity  Gradually return to your normal activities once your health care provider approves. Do not do activities that cause pain. ? Consider doing low-impact exercises, like cycling or swimming.  If you have an air cast, ask your health care provider when it is safe for you to drive.  If physical therapy was prescribed, do exercises as told by your health care provider or physical therapist. Managing pain, stiffness, and swelling   Raise (elevate) your foot above the level of your heart while you are sitting or lying down.  Move your toes often to avoid stiffness and to lessen swelling.  If directed, put ice on the injured area: ? Put ice in a plastic bag. ? Place a towel between your skin and the bag. ? Leave the ice on for 20 minutes, 2-3 times a day General instructions  If directed, wrap your foot with an elastic bandage or other wrap. This can help keep your tendon from moving too much while it   heals. Your health care provider will show you how to wrap your foot correctly.  Wear supportive shoes or heel lifts only as told by your health care provider.  Take over-the-counter and prescription medicines only as told by your health care provider.  Keep all follow-up visits as told by your health care provider. This is important. Contact a health care provider if:  You have symptoms that gets worse.  You have pain that  does not get better with medicine.  You develop new, unexplained symptoms.  You develop warmth and swelling in your foot.  You have a fever. Get help right away if:  You have a sudden popping sound or sensation in your Achilles tendon followed by severe pain.  You cannot move your toes or foot.  You cannot put any weight on your foot. Summary  Achilles tendinitis is inflammation of the tough, cord-like band that attaches the lower leg muscles to the heel bone (Achilles tendon).  This condition is usually caused by overusing the tendon and the ankle joint. It can also be caused by arthritis or normal aging.  The most common symptoms of this condition include pain, swelling, or stiffness in the Achilles tendon or in the back of the leg.  This condition is usually treated with rest, NSAIDs, and physical therapy. This information is not intended to replace advice given to you by your health care provider. Make sure you discuss any questions you have with your health care provider. Document Released: 09/14/2005 Document Revised: 10/24/2016 Document Reviewed: 10/24/2016 Elsevier Interactive Patient Education  2019 [employer]   

## 2019-05-03 NOTE — Progress Notes (Signed)
Name: Dawn Fuentes   MRN: [id number]    DOB: 03/29/1946   Date:05/03/2019       Progress Note  Subjective  Chief Complaint  Chief Complaint  Patient presents with  . Foot Injury    foot pain that radiates to calf    I connected with  [name]  on 05/03/19 at 11:00 AM EDT by a video enabled telemedicine application and verified that I am speaking with the correct person using two identifiers.  I discussed the limitations of evaluation and management by telemedicine and the availability of in person appointments. The patient expressed understanding and agreed to proceed. Staff also discussed with the patient that there may be a patient responsible charge related to this service. Patient Location: [facility] Provider Location: Office Additional Individuals present: None  HPI  Pt presents with concern for R foot pain.  She does have CKDI, so she has been avoiding NSAIDs and only taking tylenol PRN.  She is due for kidney function check as it has been about a year.  She notes pain only occurs with ambulation, and is improved with rest.  Pain is under the 4th and 5th toe and radiates to the area above the 4th and 5th toe and into the lower calf/upper heel area.  She has not been doing any additional walking, she does wear very supportive shoes.  No sensation of pebble in shoe and does not wear high heels. She denies calf swelling or tenderness or redness.  Patient Active Problem List   Diagnosis Date Noted  . [name] deficiency 12/26/2018  . Osteoarthritis of knees, bilateral 05/26/2017  . Primary open angle glaucoma of both eyes 11/21/2016  . Chronic kidney disease (CKD), stage III (moderate) ([place]) 04/06/2016  . Cystitis 10/12/2015  . Benign hypertension 08/21/2015  . Insomnia, persistent 08/21/2015  . Dyslipidemia 08/21/2015  . Gastro-esophageal reflux disease without esophagitis 08/21/2015  . Glaucoma 08/21/2015  . Blood glucose elevated 08/21/2015  . Adult hypothyroidism  08/21/2015  . Climacteric 08/21/2015  . Senile osteoporosis 08/21/2015  . Seasonal allergies 08/21/2015  . Female genuine stress incontinence 08/21/2015  . Vitamin D deficiency 08/21/2015    Social History   Tobacco Use  . Smoking status: Never Smoker  . Smokeless tobacco: Never Used  . Tobacco comment: smoking cessation materials not required  Substance Use Topics  . Alcohol use: No    Alcohol/week: 0.0 standard drinks     Current Outpatient Medications:  .  ALPRAZolam (XANAX) 0.5 MG tablet, TAKE 1 TABLET BY MOUTH AT BEDTIME AS NEEDED FOR ANXIETY(MUST LAST 90 DAYS), Disp: 10 tablet, Rfl: 0 .  aspirin 81 MG tablet, Take 81 mg by mouth daily. pm, Disp: , Rfl:  .  cholecalciferol (VITAMIN D) 1000 units tablet, Take 1,000 Units by mouth 2 (two) times daily. am, Disp: , Rfl:  .  citalopram (CELEXA) 10 MG tablet, Take 1 tablet (10 mg total) by mouth daily., Disp: 90 tablet, Rfl: 0 .  denosumab (PROLIA) 60 MG/ML SOSY injection, Inject into the skin., Disp: , Rfl:  .  fluticasone (FLONASE) 50 MCG/ACT nasal spray, Place 2 sprays into both nostrils daily. Pm, Disp: 16 g, Rfl: 2 .  INTRAROSA 6.5 MG INST, I 1 SUP VAG HS, Disp: , Rfl:  .  levocetirizine (XYZAL) 5 MG tablet, Take 1 tablet (5 mg total) by mouth every evening., Disp: 90 tablet, Rfl: 1 .  levothyroxine (SYNTHROID) 50 MCG tablet, TAKE 1 TABLET BY MOUTH EVERY DAY BEFORE  BREAKFAST AND TAKE 2 TABLETS ON SUNDAY, Disp: 102 tablet, Rfl: 1 .  losartan (COZAAR) 50 MG tablet, TAKE 1 TABLET BY MOUTH DAILY, Disp: 90 tablet, Rfl: 0 .  Melatonin 3 MG CAPS, Take 1 capsule (3 mg total) by mouth daily., Disp: 90 capsule, Rfl: 0 .  mirabegron ER (MYRBETRIQ) 25 MG TB24 tablet, Take 1 tablet (25 mg total) by mouth daily., Disp: 30 tablet, Rfl: 0 .  montelukast (SINGULAIR) 10 MG tablet, Take 1 tablet (10 mg total) by mouth daily as needed (allergies)., Disp: 90 tablet, Rfl: 1 .  Omega-3 Fatty Acids (FISH OIL PO), Take by mouth. AM, Disp: , Rfl:  .   pantoprazole (PROTONIX) 40 MG tablet, Take 1 tablet (40 mg total) by mouth daily., Disp: 90 tablet, Rfl: 1 .  simvastatin (ZOCOR) 40 MG tablet, Take 1 tablet (40 mg total) by mouth daily. pm, Disp: 90 tablet, Rfl: 1 .  timolol (TIMOPTIC) 0.5 % ophthalmic solution, INT 1 GTT IN OU BID, Disp: , Rfl: 5 .  VYZULTA 0.024 % SOLN, INT 1 GTT INTO OU AT NIGHT, Disp: , Rfl: 2 .  zolpidem (AMBIEN) 5 MG tablet, TAKE 1 TABLET(5 MG) BY MOUTH AT BEDTIME AS NEEDED FOR SLEEP, Disp: 90 tablet, Rfl: 0  Current Facility-Administered Medications:  .  cyanocobalamin ((VITAMIN B-12)) injection 1,000 mcg, 1,000 mcg, Subcutaneous, Q30 days, Steele Sizer, MD, 1,000 mcg at 02/27/19 1143  Allergies  Allergen Reactions  . Brimonidine Tartrate   . Latanoprost   . Raloxifene     bone pain  . Sulfa Antibiotics Nausea Only    Intolerance rather than allergy.    I personally reviewed active problem list, medication list, allergies with the patient/caregiver today.  ROS  Ten systems reviewed and is negative except as mentioned in HPI   Objective  Virtual encounter, vitals not obtained.  There is no height or weight on file to calculate BMI.  Nursing Note and Vital Signs reviewed.  Physical Exam  Constitutional: Patient appears well-developed and well-nourished. No distress.  HENT: Head: Normocephalic and atraumatic.  Neck: Normal range of motion. Pulmonary/Chest: Effort normal. No respiratory distress. Speaking in complete sentences Neurological: Pt is alert and oriented to person, place, and time. Coordination, speech and gait are normal.  Psychiatric: Patient has a normal mood and affect. behavior is normal. Judgment and thought content normal. MSK: Good AROM of BLE.  The R foot and calf are non-tender, non-erythematous, and non-edematous.   No results found for this or any previous visit (from the past 72 hour(s)).  Assessment & Plan  1. Foot pain, right - Roll frozen water bottle over affected  area.  Achilles stretches gently and as tolerated. Reviewed red flags for DVT in detail.  Avoiding NSAID, suggest podiatry for possible injection to help with pain. - Ambulatory referral to Podiatry  -Red flags and when to present for emergency care or RTC including fever >101.72F, chest pain, shortness of breath, new/worsening/un-resolving symptoms, reviewed with patient at time of visit. Follow up and care instructions discussed and provided in AVS. - I discussed the assessment and treatment plan with the patient. The patient was provided an opportunity to ask questions and all were answered. The patient agreed with the plan and demonstrated an understanding of the instructions.  I provided 17 minutes of non-face-to-face time during this encounter.  Hubbard Hartshorn, FNP

## 2019-05-07 ENCOUNTER — Other Ambulatory Visit: Payer: Self-pay | Admitting: Podiatry

## 2019-05-07 ENCOUNTER — Encounter: Payer: Self-pay | Admitting: Podiatry

## 2019-05-07 ENCOUNTER — Ambulatory Visit (INDEPENDENT_AMBULATORY_CARE_PROVIDER_SITE_OTHER): Payer: PPO

## 2019-05-07 ENCOUNTER — Ambulatory Visit: Payer: PPO | Admitting: Podiatry

## 2019-05-07 ENCOUNTER — Other Ambulatory Visit: Payer: Self-pay

## 2019-05-07 VITALS — BP 163/89 | HR 79 | Temp 97.7°F | Resp 16

## 2019-05-07 DIAGNOSIS — R2241 Localized swelling, mass and lump, right lower limb: Secondary | ICD-10-CM

## 2019-05-07 DIAGNOSIS — M79671 Pain in right foot: Secondary | ICD-10-CM

## 2019-05-07 DIAGNOSIS — M84374A Stress fracture, right foot, initial encounter for fracture: Secondary | ICD-10-CM | POA: Diagnosis not present

## 2019-05-07 NOTE — Progress Notes (Signed)
Subjective:    Patient ID: Dawn Fuentes, female    DOB: Aug 14, 1946, 73 y.o.   MRN: [id number]  HPI 73 year old female presents the office today for concerns of pain and swelling to the right foot she states that she is been underneath the forefoot, submetatarsal 4 and 5 where she points to.  She is noticed swelling to this area.  This is been ongoing for the last 1 week with sudden onset.  She states that started after she had a Prolia injection.  This was her second injection.  She denies recent injury or falls.   Review of Systems  All other systems reviewed and are negative.  Past Medical History:  Diagnosis Date  . Allergy   . Anxiety    ER visit in Sept 2017 for anxiety  . Arthritis   . Chronic insomnia   . Colon adenoma   . Dyslipidemia   . Dysphagia   . Female stress incontinence   . GERD (gastroesophageal reflux disease)   . [facility], both eyes    [facility]  . Hyperlipidemia   . Hypertension    controlled on meds  . Hypothyroidism   . Menopause syndrome   . Osteoporosis   . Polyp of colon   . Renal insufficiency    mild- keeping an eye on  . Vitamin D deficiency     Past Surgical History:  Procedure Laterality Date  . ABDOMINAL HYSTERECTOMY     21 years ago  . APPENDECTOMY    . COLONOSCOPY WITH PROPOFOL N/A 05/29/2015   Procedure: COLONOSCOPY WITH PROPOFOL;  Surgeon: Hulen Luster, MD;  Location: [facility] ENDOSCOPY;  Service: Gastroenterology;  Laterality: N/A;  . ESOPHAGOGASTRODUODENOSCOPY N/A 05/29/2015   Procedure: ESOPHAGOGASTRODUODENOSCOPY (EGD);  Surgeon: Hulen Luster, MD;  Location: [facility] ENDOSCOPY;  Service: Gastroenterology;  Laterality: N/A;  . EYE SURGERY     eye lids  . OVARIAN CYST REMOVAL     needed transfusion  . PHOTOCOAGULATION WITH LASER Right 11/21/2016   Procedure: PHOTOCOAGULATION WITH LASER;  Surgeon: Ronnell Freshwater, MD;  Location: [place];  Service: Ophthalmology;  Laterality: Right;  A micropulse  probe was applied to each hemilimbus with the following settings: 2076mW, 31.3% duty cycle, 90 seconds x 3 applications.      Current Outpatient Medications:  .  ALPRAZolam (XANAX) 0.5 MG tablet, TAKE 1 TABLET BY MOUTH AT BEDTIME AS NEEDED FOR ANXIETY(MUST LAST 90 DAYS), Disp: 10 tablet, Rfl: 0 .  aspirin 81 MG tablet, Take 81 mg by mouth daily. pm, Disp: , Rfl:  .  cholecalciferol (VITAMIN D) 1000 units tablet, Take 1,000 Units by mouth 2 (two) times daily. am, Disp: , Rfl:  .  citalopram (CELEXA) 10 MG tablet, Take 1 tablet (10 mg total) by mouth daily., Disp: 90 tablet, Rfl: 0 .  denosumab (PROLIA) 60 MG/ML SOSY injection, Inject into the skin., Disp: , Rfl:  .  fluticasone (FLONASE) 50 MCG/ACT nasal spray, Place 2 sprays into both nostrils daily. Pm, Disp: 16 g, Rfl: 2 .  INTRAROSA 6.5 MG INST, I 1 SUP VAG HS, Disp: , Rfl:  .  levocetirizine (XYZAL) 5 MG tablet, Take 1 tablet (5 mg total) by mouth every evening., Disp: 90 tablet, Rfl: 1 .  levothyroxine (SYNTHROID) 50 MCG tablet, TAKE 1 TABLET BY MOUTH EVERY DAY BEFORE BREAKFAST AND TAKE 2 TABLETS ON SUNDAY, Disp: 102 tablet, Rfl: 1 .  losartan (COZAAR) 50 MG tablet, TAKE 1 TABLET BY MOUTH  DAILY, Disp: 90 tablet, Rfl: 0 .  Melatonin 3 MG CAPS, Take 1 capsule (3 mg total) by mouth daily., Disp: 90 capsule, Rfl: 0 .  mirabegron ER (MYRBETRIQ) 25 MG TB24 tablet, Take 1 tablet (25 mg total) by mouth daily., Disp: 30 tablet, Rfl: 0 .  montelukast (SINGULAIR) 10 MG tablet, Take 1 tablet (10 mg total) by mouth daily as needed (allergies)., Disp: 90 tablet, Rfl: 1 .  Omega-3 Fatty Acids (FISH OIL PO), Take by mouth. AM, Disp: , Rfl:  .  pantoprazole (PROTONIX) 40 MG tablet, Take 1 tablet (40 mg total) by mouth daily., Disp: 90 tablet, Rfl: 1 .  simvastatin (ZOCOR) 40 MG tablet, Take 1 tablet (40 mg total) by mouth daily. pm, Disp: 90 tablet, Rfl: 1 .  timolol (TIMOPTIC) 0.5 % ophthalmic solution, INT 1 GTT IN OU BID, Disp: , Rfl: 5 .  VYZULTA 0.024  % SOLN, INT 1 GTT INTO OU AT NIGHT, Disp: , Rfl: 2 .  zolpidem (AMBIEN) 5 MG tablet, TAKE 1 TABLET(5 MG) BY MOUTH AT BEDTIME AS NEEDED FOR SLEEP, Disp: 90 tablet, Rfl: 0 .  tiZANidine (ZANAFLEX) 4 MG tablet, Take 0.5-1 tablets (2-4 mg total) by mouth every 8 (eight) hours as needed for muscle spasms., Disp: 20 tablet, Rfl: 0  Allergies  Allergen Reactions  . Brimonidine Tartrate   . Latanoprost   . Raloxifene     bone pain  . Sulfa Antibiotics Nausea Only    Intolerance rather than allergy.         Objective:   Physical Exam General: AAO x3, NAD  Dermatological: Skin is warm, dry and supple bilateral. Nails x 10 are well manicured; remaining integument appears unremarkable at this time. There are no open sores, no preulcerative lesions, no rash or signs of infection present.  Vascular: Dorsalis Pedis artery and Posterior Tibial artery pedal pulses are 2/4 bilateral with immedate capillary fill time. There is no pain with calf compression, swelling, warmth, erythema.   Neruologic: Grossly intact via light touch bilateral.  Protective threshold with Semmes Wienstein monofilament intact to all pedal sites bilateral.   Musculoskeletal: There is edema on the right foot compared to the contralateral extremity.  The majority tenderness appears to be on the fourth metatarsal neck as well as mildly to the fifth metatarsal.  There is no erythema or warmth associated with swelling.  MMT 5/5.  Flexor, extensor tendons appear to be intact.  Gait: Unassisted, Nonantalgic.      Assessment & Plan:  Fourth metatarsal stress fracture -Treatment options discussed including all alternatives, risks, and complications -Etiology of symptoms were discussed -X-rays were obtained and reviewed with the patient.There is angulation present of the fourth metatarsal with likely stress fracture present on the neck.  I do not have any old x-rays to compare to but given the pinpoint pain as well as swelling to the  right foot been a treat for stress fracture.  She was immobilized in a surgical boot today.  Ice elevation.  Return in about 2 weeks (around 05/21/2019).  Repeat x-rays-she would like to follow-up in [place].  She would like to see Dr. Milinda Pointer.  Trula Slade DPM

## 2019-05-07 NOTE — Patient Instructions (Signed)
Stress Fracture  A stress fracture is a small break or crack in a bone. A stress fracture can be fully broken (complete) or partially broken (incomplete). The most common sites for stress fractures are the bones in the front of your feet (metatarsals), your heel (calcaneus), and the long bone of your lower leg (tibia). What are the causes? This condition is caused by overuse or repetitive exercise, such as running. It happens when a bone cannot absorb any more shock because the muscles around it are weak. Stress fractures happen most commonly when:  You rapidly increase or start a new physical activity.  You use shoes that are worn out or do not fit properly.  You exercise on a new surface. What increases the risk? You are more likely to develop this condition if:  You have a condition that causes weak bones (osteoporosis).  You are female. Stress fractures are more likely to occur in women. What are the signs or symptoms? The most common symptom of a stress fracture is feeling pain when you are using or putting weight on the affected part of your body. The pain usually improves when you are resting. Other symptoms may include:  Swelling of the affected area.  Pain in the area when it is touched. Stress fracture pain usually develops over time. How is this diagnosed? This condition may be diagnosed by:  Your symptoms.  Your medical history.  A physical exam.  Imaging tests, such as: ? X-rays. ? MRI. ? Bone scan. How is this treated? Treatment depends on the severity of your stress fracture. It is commonly treated with resting, icing, compression, and elevation (RICE therapy). Treatment may also include:  Medicines to reduce inflammation.  A cast or a walking shoe.  Crutches.  Surgery. This is usually only in severe cases. Follow these instructions at home: If you have a cast:  Do not put pressure on any part of the cast until it is fully hardened. This may take  several hours.  Do not stick anything inside the cast to scratch your skin. Doing that increases your risk of infection.  Check the skin around the cast every day. Tell your health care provider about any concerns.  You may put lotion on dry skin around the edges of the cast. Do not put lotion on the skin underneath the cast.  Keep the cast clean.  If the cast is not waterproof: ? Do not let it get wet. ? Cover it with a watertight covering when you take a bath or a shower. If you have a walking shoe:  Wear the shoe as told by your health care provider. Remove it only as told by your health care provider.  Loosen the shoe if your toes tingle, become numb, or turn cold and blue.  Keep the shoe clean.  If the shoe is not waterproof: ? Do not let it get wet. Managing pain, stiffness, and swelling   If directed, apply ice to the injured area: ? If you have a walking shoe, remove the shoe as told by your health care provider. ? Put ice in a plastic bag. ? Place a towel between your skin and the bag or between your cast and the bag. ? Leave the ice on for 20 minutes, 2-3 times per day.  Move your toes often to avoid stiffness and to lessen swelling.  Raise (elevate) the injured area above the level of your heart while you are sitting or lying down. Activity  Rest  as directed by your health care provider. Ask your health care provider if you may do alternative exercises, such as swimming or biking, while you are healing.  Return to your normal activities as directed by your health care provider. Ask your health care provider what activities are safe for you.  Perform range-of-motion exercises only as directed by your health care provider. General instructions  Do not use the injured limb to support yourbody weight until your health care provider says that you can. Use crutches if your health care provider tells you to do so.  Do not use any products that contain nicotine or  tobacco, such as cigarettes and e-cigarettes. These can delay bone healing. If you need help quitting, ask your health care provider.  Take over-the-counter and prescription medicines only as told by your health care provider.  Keep all follow-up visits as told by your health care provider. This is important. How is this prevented?  Only wear shoes that: ? Fit well. ? Are not worn out.  Eat a healthy diet that contains vitamin D and calcium. This helps keep your bones strong. Good sources of calcium and vitamin D include: ? Low-fat dairy products such as milk, yogurt, and cheese. ? Certain fish, such as fresh or canned salmon, tuna, and sardines. ? Products that have calcium and vitamin D added to them (fortified products), such as fortified cereals or juice.  Be careful when you start a new physical activity. Give your body time to adjust.  Avoid doing only one kind of activity. Do different exercises, such as swimming and running, so that no single part of your body gets overused.  Do strength-training exercises. Contact a health care provider if:  Your pain gets worse.  You have new symptoms.  You have increased swelling. Get help right away if:  You lose feeling in the injured area. Summary  A stress fracture is a small break or crack in a bone. A stress fracture can be fully broken (complete) or partially broken (incomplete).  This condition is caused by overuse or repetitive exercise, such as running.  The most common symptom of a stress fracture is feeling pain when you are using or putting weight on the affected part of your body.  Treatment depends on the severity of your stress fracture. This information is not intended to replace advice given to you by your health care provider. Make sure you discuss any questions you have with your health care provider. Document Released: 02/25/2003 Document Revised: 01/16/2018 Document Reviewed: 01/16/2018 Elsevier Interactive  Patient Education  2019 [employer].

## 2019-05-14 ENCOUNTER — Telehealth: Payer: Self-pay | Admitting: Family Medicine

## 2019-05-14 ENCOUNTER — Ambulatory Visit (INDEPENDENT_AMBULATORY_CARE_PROVIDER_SITE_OTHER): Payer: PPO | Admitting: Family Medicine

## 2019-05-14 ENCOUNTER — Encounter: Payer: Self-pay | Admitting: Family Medicine

## 2019-05-14 DIAGNOSIS — M542 Cervicalgia: Secondary | ICD-10-CM

## 2019-05-14 MED ORDER — TIZANIDINE HCL 4 MG PO TABS: 2.0000 mg | ORAL_TABLET | Freq: Three times a day (TID) | ORAL | 0 refills | Status: DC | PRN

## 2019-05-14 NOTE — Telephone Encounter (Signed)
Copied from [place] [id number]. Topic: Quick Communication - See Telephone Encounter >> May 14, 2019 10:51 AM Ivar Drape wrote: CRM for notification. See Telephone encounter for: 05/14/19. Patient is experiencing neck pain and would like a muscle relaxer called in. She would like to talk to the provider or the medical assistant about it. Please call patient's cell phone number [phone].

## 2019-05-14 NOTE — Telephone Encounter (Signed)
appt scheduled

## 2019-05-14 NOTE — Progress Notes (Signed)
Name: Dawn Fuentes   MRN: [id number]    DOB: 31-Mar-1946   Date:05/14/2019       Progress Note  Subjective  Chief Complaint  Chief Complaint  Patient presents with  . Neck Pain    right, wants to get muscle relaxer    I connected with  [name]  on 05/14/19 at 11:40 AM EDT by a video enabled telemedicine application and verified that I am speaking with the correct person using two identifiers.  I discussed the limitations of evaluation and management by telemedicine and the availability of in person appointments. The patient expressed understanding and agreed to proceed. Staff also discussed with the patient that there may be a patient responsible charge related to this service. Patient Location: Home Provider Location: Office Additional Individuals present: None  HPI  Pt presents with concern for RIGHT sided neck pain for about 7 days.  She has been using OTC rubs without relief (Bengay and biofreeze).  She is having to wear a boot on her foot - did end up going to podiatrist who found a stress fracture - she wonders if this position change has caused her to have some neck pain.  There is occasional radiation into the RIGHT shoulder.  No weakness, numbness, or tingling. No known injury to the area. She is currently taking Ambien PRN. Taking Xanax sparingly.   Patient Active Problem List   Diagnosis Date Noted  . [name] deficiency 12/26/2018  . Osteoarthritis of knees, bilateral 05/26/2017  . Primary open angle glaucoma of both eyes 11/21/2016  . Chronic kidney disease (CKD), stage III (moderate) ([place]) 04/06/2016  . Cystitis 10/12/2015  . Benign hypertension 08/21/2015  . Insomnia, persistent 08/21/2015  . Dyslipidemia 08/21/2015  . Gastro-esophageal reflux disease without esophagitis 08/21/2015  . Glaucoma 08/21/2015  . Blood glucose elevated 08/21/2015  . Adult hypothyroidism 08/21/2015  . Climacteric 08/21/2015  . Senile osteoporosis 08/21/2015  . Seasonal allergies  08/21/2015  . Female genuine stress incontinence 08/21/2015  . Vitamin D deficiency 08/21/2015    Social History   Tobacco Use  . Smoking status: Never Smoker  . Smokeless tobacco: Never Used  . Tobacco comment: smoking cessation materials not required  Substance Use Topics  . Alcohol use: No    Alcohol/week: 0.0 standard drinks     Current Outpatient Medications:  .  ALPRAZolam (XANAX) 0.5 MG tablet, TAKE 1 TABLET BY MOUTH AT BEDTIME AS NEEDED FOR ANXIETY(MUST LAST 90 DAYS), Disp: 10 tablet, Rfl: 0 .  aspirin 81 MG tablet, Take 81 mg by mouth daily. pm, Disp: , Rfl:  .  cholecalciferol (VITAMIN D) 1000 units tablet, Take 1,000 Units by mouth 2 (two) times daily. am, Disp: , Rfl:  .  ciprofloxacin (CIPRO) 500 MG tablet, , Disp: , Rfl:  .  citalopram (CELEXA) 10 MG tablet, Take 1 tablet (10 mg total) by mouth daily., Disp: 90 tablet, Rfl: 0 .  denosumab (PROLIA) 60 MG/ML SOSY injection, Inject into the skin., Disp: , Rfl:  .  fluticasone (FLONASE) 50 MCG/ACT nasal spray, Place 2 sprays into both nostrils daily. Pm, Disp: 16 g, Rfl: 2 .  INTRAROSA 6.5 MG INST, I 1 SUP VAG HS, Disp: , Rfl:  .  levocetirizine (XYZAL) 5 MG tablet, Take 1 tablet (5 mg total) by mouth every evening., Disp: 90 tablet, Rfl: 1 .  levothyroxine (SYNTHROID) 50 MCG tablet, TAKE 1 TABLET BY MOUTH EVERY DAY BEFORE BREAKFAST AND TAKE 2 TABLETS ON SUNDAY, Disp: 102 tablet,  Rfl: 1 .  losartan (COZAAR) 50 MG tablet, TAKE 1 TABLET BY MOUTH DAILY, Disp: 90 tablet, Rfl: 0 .  Melatonin 3 MG CAPS, Take 1 capsule (3 mg total) by mouth daily., Disp: 90 capsule, Rfl: 0 .  mirabegron ER (MYRBETRIQ) 25 MG TB24 tablet, Take 1 tablet (25 mg total) by mouth daily., Disp: 30 tablet, Rfl: 0 .  montelukast (SINGULAIR) 10 MG tablet, Take 1 tablet (10 mg total) by mouth daily as needed (allergies)., Disp: 90 tablet, Rfl: 1 .  Omega-3 Fatty Acids (FISH OIL PO), Take by mouth. AM, Disp: , Rfl:  .  pantoprazole (PROTONIX) 40 MG tablet,  Take 1 tablet (40 mg total) by mouth daily., Disp: 90 tablet, Rfl: 1 .  simvastatin (ZOCOR) 40 MG tablet, Take 1 tablet (40 mg total) by mouth daily. pm, Disp: 90 tablet, Rfl: 1 .  timolol (TIMOPTIC) 0.5 % ophthalmic solution, INT 1 GTT IN OU BID, Disp: , Rfl: 5 .  VYZULTA 0.024 % SOLN, INT 1 GTT INTO OU AT NIGHT, Disp: , Rfl: 2 .  zolpidem (AMBIEN) 5 MG tablet, TAKE 1 TABLET(5 MG) BY MOUTH AT BEDTIME AS NEEDED FOR SLEEP, Disp: 90 tablet, Rfl: 0  Current Facility-Administered Medications:  .  cyanocobalamin ((VITAMIN B-12)) injection 1,000 mcg, 1,000 mcg, Subcutaneous, Q30 days, Steele Sizer, MD, 1,000 mcg at 02/27/19 1143  Allergies  Allergen Reactions  . Brimonidine Tartrate   . Latanoprost   . Raloxifene     bone pain  . Sulfa Antibiotics Nausea Only    Intolerance rather than allergy.    I personally reviewed active problem list, medication list, allergies, health maintenance, notes from last encounter, lab results with the patient/caregiver today.  ROS  Constitutional: Negative for fever or weight change.  Respiratory: Negative for cough and shortness of breath.   Cardiovascular: Negative for chest pain or palpitations.  Gastrointestinal: Negative for abdominal pain, no bowel changes.  Musculoskeletal: Negative for gait problem or joint swelling.  Skin: Negative for rash.  Neurological: Negative for dizziness or headache.  No other specific complaints in a complete review of systems (except as listed in HPI above).  Objective  Virtual encounter, vitals not obtained.  There is no height or weight on file to calculate BMI.  Nursing Note and Vital Signs reviewed.  Physical Exam  Constitutional: Patient appears well-developed and well-nourished. No distress.  HENT: Head: Normocephalic and atraumatic.  Neck: Normal range of motion with some discomfort. Pulmonary/Chest: Effort normal. No respiratory distress. Speaking in complete sentences Neurological: Pt is alert and  oriented to person, place, and time. Coordination, speech and gait are normal.  Psychiatric: Patient has a normal mood and affect. behavior is normal. Judgment and thought content normal.  No results found for this or any previous visit (from the past 72 hour(s)).  Assessment & Plan  1. Neck pain on right side - Follow up in 1 week if not improving. - tiZANidine (ZANAFLEX) 4 MG tablet; Take 0.5-1 tablets (2-4 mg total) by mouth every 8 (eight) hours as needed for muscle spasms.  Dispense: 20 tablet; Refill: 0 - Will not take Ambien or Xanax while taking tizanidine - advised of sedative effects.  -Red flags and when to present for emergency care or RTC including fever >101.14F, chest pain, shortness of breath, new/worsening/un-resolving symptoms, reviewed with patient at time of visit. Follow up and care instructions discussed and provided in AVS. - I discussed the assessment and treatment plan with the patient. The patient was provided an  opportunity to ask questions and all were answered. The patient agreed with the plan and demonstrated an understanding of the instructions.  I provided 15 minutes of non-face-to-face time during this encounter.  Hubbard Hartshorn, FNP

## 2019-05-21 ENCOUNTER — Other Ambulatory Visit: Payer: Self-pay

## 2019-05-21 ENCOUNTER — Ambulatory Visit (INDEPENDENT_AMBULATORY_CARE_PROVIDER_SITE_OTHER): Payer: PPO

## 2019-05-21 DIAGNOSIS — N183 Chronic kidney disease, stage 3 (moderate): Secondary | ICD-10-CM | POA: Diagnosis not present

## 2019-05-21 DIAGNOSIS — E038 Other specified hypothyroidism: Secondary | ICD-10-CM | POA: Diagnosis not present

## 2019-05-21 DIAGNOSIS — E538 Deficiency of other specified B group vitamins: Secondary | ICD-10-CM

## 2019-05-21 DIAGNOSIS — E785 Hyperlipidemia, unspecified: Secondary | ICD-10-CM | POA: Diagnosis not present

## 2019-05-21 MED ORDER — CYANOCOBALAMIN 1000 MCG/ML IJ SOLN
1000.0000 ug | INTRAMUSCULAR | Status: DC
  Administered 2019-05-21 – 2022-03-08 (×15): 1000 ug via INTRAMUSCULAR

## 2019-05-21 NOTE — Progress Notes (Signed)
Patient came in for her B-12 injection. She tolerated it well. NKDA.   

## 2019-05-22 ENCOUNTER — Ambulatory Visit: Payer: PPO | Attending: Family Medicine

## 2019-05-22 ENCOUNTER — Ambulatory Visit: Payer: PPO | Admitting: Podiatry

## 2019-05-22 ENCOUNTER — Ambulatory Visit (INDEPENDENT_AMBULATORY_CARE_PROVIDER_SITE_OTHER): Payer: PPO

## 2019-05-22 ENCOUNTER — Other Ambulatory Visit: Payer: Self-pay

## 2019-05-22 ENCOUNTER — Encounter: Payer: Self-pay | Admitting: Podiatry

## 2019-05-22 VITALS — Temp 96.9°F

## 2019-05-22 DIAGNOSIS — S92344D Nondisplaced fracture of fourth metatarsal bone, right foot, subsequent encounter for fracture with routine healing: Secondary | ICD-10-CM | POA: Diagnosis not present

## 2019-05-22 DIAGNOSIS — R293 Abnormal posture: Secondary | ICD-10-CM | POA: Diagnosis present

## 2019-05-22 DIAGNOSIS — R278 Other lack of coordination: Secondary | ICD-10-CM | POA: Insufficient documentation

## 2019-05-22 DIAGNOSIS — M62838 Other muscle spasm: Secondary | ICD-10-CM | POA: Insufficient documentation

## 2019-05-22 DIAGNOSIS — M6281 Muscle weakness (generalized): Secondary | ICD-10-CM | POA: Diagnosis present

## 2019-05-22 LAB — LIPID PANEL
Cholesterol: 159 mg/dL
HDL: 53 mg/dL
LDL Cholesterol (Calc): 85 mg/dL
Non-HDL Cholesterol (Calc): 106 mg/dL
Total CHOL/HDL Ratio: 3 (calc)
Triglycerides: 111 mg/dL

## 2019-05-22 LAB — COMPLETE METABOLIC PANEL WITHOUT GFR
AG Ratio: 2 (calc) (ref 1.0–2.5)
ALT: 33 U/L — ABNORMAL HIGH (ref 6–29)
AST: 29 U/L (ref 10–35)
Albumin: 4.3 g/dL (ref 3.6–5.1)
Alkaline phosphatase (APISO): 47 U/L (ref 37–153)
BUN/Creatinine Ratio: 17 (calc) (ref 6–22)
BUN: 21 mg/dL (ref 7–25)
CO2: 27 mmol/L (ref 20–32)
Calcium: 9.4 mg/dL (ref 8.6–10.4)
Chloride: 105 mmol/L (ref 98–110)
Creat: 1.23 mg/dL — ABNORMAL HIGH (ref 0.60–0.93)
GFR, Est African American: 51 mL/min/{1.73_m2} — ABNORMAL LOW
GFR, Est Non African American: 44 mL/min/{1.73_m2} — ABNORMAL LOW
Globulin: 2.2 g/dL (ref 1.9–3.7)
Glucose, Bld: 106 mg/dL — ABNORMAL HIGH (ref 65–99)
Potassium: 4.1 mmol/L (ref 3.5–5.3)
Sodium: 140 mmol/L (ref 135–146)
Total Bilirubin: 0.4 mg/dL (ref 0.2–1.2)
Total Protein: 6.5 g/dL (ref 6.1–8.1)

## 2019-05-22 LAB — CBC WITH DIFFERENTIAL/PLATELET
Absolute Monocytes: 648 {cells}/uL (ref 200–950)
Basophils Absolute: 108 {cells}/uL (ref 0–200)
Basophils Relative: 2 %
Eosinophils Absolute: 232 {cells}/uL (ref 15–500)
Eosinophils Relative: 4.3 %
HCT: 37.6 % (ref 35.0–45.0)
Hemoglobin: 12.5 g/dL (ref 11.7–15.5)
Lymphs Abs: 1377 {cells}/uL (ref 850–3900)
MCH: 30.9 pg (ref 27.0–33.0)
MCHC: 33.2 g/dL (ref 32.0–36.0)
MCV: 93.1 fL (ref 80.0–100.0)
MPV: 11.3 fL (ref 7.5–12.5)
Monocytes Relative: 12 %
Neutro Abs: 3035 {cells}/uL (ref 1500–7800)
Neutrophils Relative %: 56.2 %
Platelets: 259 10*3/uL (ref 140–400)
RBC: 4.04 Million/uL (ref 3.80–5.10)
RDW: 12.3 % (ref 11.0–15.0)
Total Lymphocyte: 25.5 %
WBC: 5.4 10*3/uL (ref 3.8–10.8)

## 2019-05-22 LAB — TSH: TSH: 3.81 m[IU]/L (ref 0.40–4.50)

## 2019-05-22 LAB — VITAMIN D 25 HYDROXY (VIT D DEFICIENCY, FRACTURES): Vit D, 25-Hydroxy: 52 ng/mL (ref 30–100)

## 2019-05-22 LAB — B12 AND FOLATE PANEL
Folate: 12.5 ng/mL
Vitamin B-12: 415 pg/mL (ref 200–1100)

## 2019-05-22 NOTE — Progress Notes (Signed)
She presents today for follow-up of fracture fourth metatarsal right foot.  She states that it seems to be doing better.  She states that the foot looks better but I am tired of wearing this boot.  Objective: Vital signs are stable alert oriented x3 right foot demonstrates mild edema mild erythema overlying the fourth metatarsal phalangeal joint area.  Mild tenderness on palpation of this area.  Radiographs presented today do not demonstrate any healing of the fracture at all it is fractured at the neck and dorsal and laterally pivoted.  However there does not appear to be a complete dislocation at all.  Assessment: Fractured neck fourth metatarsal right foot.  Plan: Placed her back in her Cam walker will follow-up with her in 1 month for another set of x-rays.

## 2019-05-22 NOTE — Therapy (Signed)
[place] MAIN [facility] SERVICES [address] [place], [place], [address] Phone: [phone]   Fax:  [phone]  Physical Therapy Evaluation  The patient has been informed of current processes in place at Outpatient Rehab to protect patients from Covid-19 exposure including social distancing, schedule modifications, and new cleaning procedures. After discussing their particular risk with a therapist based on the patient's personal risk factors, the patient has decided to proceed with in-person therapy.  Patient Details  Name: ELISHA MCGRUDER MRN: [id number] Date of Birth: [birth year] Referring Provider (PT): Steele Sizer   Encounter Date: 05/22/2019  PT End of Session - 05/22/19 1049    Visit Number  1    Number of Visits  10    Date for PT Re-Evaluation  07/31/19    Authorization Time Period  through 07/31/2019    Authorization - Visit Number  1    Authorization - Number of Visits  10    PT Start Time  4888    PT Stop Time  1130    PT Time Calculation (min)  60 min    Activity Tolerance  Patient tolerated treatment well;No increased pain    Behavior During Therapy  WFL for tasks assessed/performed       Past Medical History:  Diagnosis Date  . Allergy   . Anxiety    ER visit in Sept 2017 for anxiety  . Arthritis   . Chronic insomnia   . Colon adenoma   . Dyslipidemia   . Dysphagia   . Female stress incontinence   . GERD (gastroesophageal reflux disease)   . Glaucoma, both eyes    [facility]  . Hyperlipidemia   . Hypertension    controlled on meds  . Hypothyroidism   . Menopause syndrome   . Osteoporosis   . Polyp of colon   . Renal insufficiency    mild- keeping an eye on  . Vitamin D deficiency     Past Surgical History:  Procedure Laterality Date  . ABDOMINAL HYSTERECTOMY     21 years ago  . APPENDECTOMY    . COLONOSCOPY WITH PROPOFOL N/A 05/29/2015   Procedure: COLONOSCOPY WITH PROPOFOL;   Surgeon: Hulen Luster, MD;  Location: [facility] ENDOSCOPY;  Service: Gastroenterology;  Laterality: N/A;  . ESOPHAGOGASTRODUODENOSCOPY N/A 05/29/2015   Procedure: ESOPHAGOGASTRODUODENOSCOPY (EGD);  Surgeon: Hulen Luster, MD;  Location: [facility] ENDOSCOPY;  Service: Gastroenterology;  Laterality: N/A;  . EYE SURGERY     eye lids  . OVARIAN CYST REMOVAL     needed transfusion  . PHOTOCOAGULATION WITH LASER Right 11/21/2016   Procedure: PHOTOCOAGULATION WITH LASER;  Surgeon: Ronnell Freshwater, MD;  Location: [place];  Service: Ophthalmology;  Laterality: Right;  A micropulse probe was applied to each hemilimbus with the following settings: 2078mW, 31.3% duty cycle, 90 seconds x 3 applications.     There were no vitals filed for this visit.    Pelvic Floor Physical Therapy Evaluation and Assessment  SCREENING  Falls in last 6 mo: no  Red Flags:  Have you had any night sweats? no Unexplained weight loss? no Saddle anesthesia? no Unexplained changes in bowel or bladder habits? no  SUBJECTIVE  Patient reports: Has been having leakage for years, wearing 3# poise pads, going through 2-3 per day (not soaked). Has urge, worst in the morning and it is enough to wet clothing. Also has leakage with cough/sneeze  Precautions:  See History  Social/Family/Vocational History:  Retired, was doing aerobics and walking (group class silver sneakers e.g.) 3 days per week before shut-down.  Recent Procedures/Tests/Findings:  Had an x-ray on R foot  Which showed hairline/stress fracture  Obstetrical History: 1 vaginal delivery  Gynecological History: Hysterectomy 1996, cyst removal,  Appendectomy.  Vaginal Dryness Frequent UTI's  Urinary History: Mixed UI started ~ 3 years ago.  Gastrointestinal History: Has to strain, type 1 stool able to go daily  Drinks ~ 2 12 oz bottles of water throughout the day, 1 Dr. Malachi Bonds and Sweet tea (~ 12 oz) per day  Sexual activity/pain: Yes, hurts  with initial penetration  Location of pain: R SIJ Current pain:  0/10  Max pain:  3/10 Least pain:  0/10 Nature of pain: deep ache  Patient Goals: To be able to not have to wear Pads and not have urinary incontinence.   OBJECTIVE  Posture/Observations:  Sitting:  Standing: R ASIS high, Hyperlordosis R PSIS high, R foot ER>L  Palpation/Segmental Motion/Joint Play: Decreased mobility on L border of SIJ, . TTP to B Piriformis, OI externally, and Adductors.  Special tests:   Leg-length: R 87, L 85.5 (1.5 cm difference) Supine-to-long sit: R long in both.   Range of Motion/Flexibilty:  Spine: ~ 6 in from floor Hips:   Strength/MMT: deferred to follow up LE MMT  LE MMT Left Right  Hip flex:  (L2) /5 /5  Hip ext: /5 /5  Hip abd: /5 /5  Hip add: /5 /5  Hip IR /5 /5  Hip ER /5 /5     Abdominal:  Palpation: TTP to B Psoas and Iliacus Diastasis: not assessed  Pelvic Floor External Exam:  Deferred until deemed necessary due to Covid-19 precautions.    Introitus Appears:  Skin integrity:  Palpation: Cough: Prolapse visible?: Scar mobility:  Internal Vaginal Exam: Strength (PERF):  Symmetry: Palpation: Prolapse:   Internal Rectal Exam: Strength (PERF): Symmetry: Palpation: Prolapse:   Gait Analysis: Wearing a boot on R foot, vaulting.   Pelvic Floor Outcome Measures:  PFDI: 105/300   INTERVENTIONS THIS SESSION: Self-care: Educated on the structure and function of the pelvic floor in relation to their symptoms as well as the POC, and initial HEP in order to set patient expectations and understanding from which we will build on in the future sessions.   Total time: 60 min.                 Objective measurements completed on examination: See above findings.                PT Short Term Goals - 05/23/19 1205      PT SHORT TERM GOAL #1   Title  Patient will demonstrate improved pelvic alignment and balance of musculature  surrounding the pelvis to facilitate decreased PFM spasms and improve PFM function for decreased SUI.    Baseline  spasms surrounding the pelvis and RLE long by 1.5 cm.    Time  5    Period  Weeks    Status  New    Target Date  06/27/19      PT SHORT TERM GOAL #2   Title  Patient will report consistent use of foot-stool (squatty-potty) for positioning with BM to decrease pain with BM and intra-abdominal pressure    Baseline  Pt. straining to have BM daily    Time  5    Period  Weeks    Status  New    Target Date  06/27/19  PT SHORT TERM GOAL #3   Title  Pt. will drink 64 oz. of water per day, decrease caffinated beverages to 1 per day and successfully implement urge suppression technique to decrease urge incontinence.    Baseline  Pt. only drinking 24 oz of water with other fluids being caffinated. not aware of urge suppression technique.    Time  5    Period  Weeks    Status  New    Target Date  06/27/19        PT Long Term Goals - 05/23/19 1201      PT LONG TERM GOAL #1   Title  Patient will report no episodes of SUI or UUI over the course of the prior two weeks to demonstrate improved functional ability.    Baseline  Having SUI and UUI daily, using 2-3 #3 pads daily.    Time  10    Period  Weeks    Status  New    Target Date  08/01/19      PT LONG TERM GOAL #2   Title  Patient will score at or below 60/300 on the PFDI to demonstrate a clinically meaningful decrease in disability and distress due to pelvic floor dysfunction.    Baseline  PFDI: 105/300     Time  10    Period  Weeks    Status  New    Target Date  08/01/19      PT LONG TERM GOAL #3   Title  Patient will report having BM's at least every-other day with consistency between [facility] stool scale 3-5 over the prior week to demonstrate decreased constipation.    Baseline  Having to strain to have a BM, type 1 stool.    Time  10    Period  Weeks    Status  New    Target Date  08/01/19              Plan - 05/23/19 1125    Clinical Impression Statement  Pt. is a 73 y/o female who presents today with cheif c/o mixed incontinence as well as constipation and R LBP. Her relevant PMH includes chronic kidney disease, osteoporosis, anxiety, insomnia, multiple abdominal surguries, stress fracture of R foot as well as others. Her Clinical exam reveals a 1.5cm leg-length discrepancy with the RLE long, which is exacerbated by current use of a boot for healing of a R foot stress fracture ads well as an anterior pelvic tilt/hyperlordosis with spasms surrounding the pelvis. She will benefit from skilled pelvic floor PT to address the noted defecits and to continue to assess for and address other potential causes of incontinence and LBP.    Personal Factors and Comorbidities  Comorbidity 3+;Age    Comorbidities  Chronic kidney disease, osteoporosis, anxiety, insomnia, multiple abdominal surguries, stress fracture of R foot, and more    Examination-Activity Limitations  Toileting;Continence;Locomotion Level;Stand;Bend;Lift;Squat    Examination-Participation Restrictions  Arts administrator;Yard Work;Church    Stability/Clinical Decision Making  Unstable/Unpredictable    Clinical Decision Making  High    Rehab Potential  Good    PT Frequency  1x / week    PT Duration  Other (comment)   10 weeks   PT Treatment/Interventions  ADLs/Self Care Home Management;Biofeedback;Traction;Electrical Stimulation;Moist Heat;Functional mobility training;Gait training;Therapeutic activities;Therapeutic exercise;Balance training;Neuromuscular re-education;Patient/family education;Scar mobilization;Passive range of motion;Dry needling;Taping;Manual techniques;Spinal Manipulations;Joint Manipulations    PT Next Visit Plan  give L shoe-lift (suggest leveler as well for boot height), align pelvis and Perform  TP release to B hip-flexors and adductors, give stretches/exercises for lower crossed syndrome and hip  abd strengthening.    Consulted and Agree with Plan of Care  Patient       Patient will benefit from skilled therapeutic intervention in order to improve the following deficits and impairments:  Abnormal gait, Pain, Improper body mechanics, Decreased coordination, Increased muscle spasms, Postural dysfunction, Decreased activity tolerance, Decreased strength, Decreased balance, Difficulty walking, Impaired flexibility  Visit Diagnosis: Abnormal posture  Other muscle spasm  Muscle weakness (generalized)  Other lack of coordination     Problem List Patient Active Problem List   Diagnosis Date Noted  . [name] deficiency 12/26/2018  . Osteoarthritis of knees, bilateral 05/26/2017  . Primary open angle glaucoma of both eyes 11/21/2016  . Chronic kidney disease (CKD), stage III (moderate) ([place]) 04/06/2016  . Cystitis 10/12/2015  . Benign hypertension 08/21/2015  . Insomnia, persistent 08/21/2015  . Dyslipidemia 08/21/2015  . Gastro-esophageal reflux disease without esophagitis 08/21/2015  . Glaucoma 08/21/2015  . Blood glucose elevated 08/21/2015  . Adult hypothyroidism 08/21/2015  . Climacteric 08/21/2015  . Senile osteoporosis 08/21/2015  . Seasonal allergies 08/21/2015  . Female genuine stress incontinence 08/21/2015  . Vitamin D deficiency 08/21/2015   Willa Rough DPT, ATC Willa Rough 05/23/2019, 12:10 PM  [place] MAIN [facility] SERVICES [address] [place], [place], [address] Phone: [phone]   Fax:  [phone]  Name: SHON MANSOURI MRN: [id number] Date of Birth: Feb 06, 1946

## 2019-05-24 ENCOUNTER — Other Ambulatory Visit: Payer: Self-pay

## 2019-05-24 ENCOUNTER — Other Ambulatory Visit: Payer: Self-pay | Admitting: Family Medicine

## 2019-05-24 ENCOUNTER — Encounter: Payer: Self-pay | Admitting: Family Medicine

## 2019-05-24 ENCOUNTER — Ambulatory Visit (INDEPENDENT_AMBULATORY_CARE_PROVIDER_SITE_OTHER): Payer: PPO | Admitting: Family Medicine

## 2019-05-24 VITALS — BP 126/74 | HR 85 | Temp 97.8°F | Resp 16 | Ht 65.5 in | Wt 174.2 lb

## 2019-05-24 DIAGNOSIS — F411 Generalized anxiety disorder: Secondary | ICD-10-CM

## 2019-05-24 DIAGNOSIS — Z01419 Encounter for gynecological examination (general) (routine) without abnormal findings: Secondary | ICD-10-CM | POA: Diagnosis not present

## 2019-05-24 MED ORDER — ALPRAZOLAM 0.5 MG PO TABS: 0.5000 mg | ORAL_TABLET | Freq: Every evening | ORAL | 0 refills | Status: DC | PRN

## 2019-05-24 NOTE — Progress Notes (Signed)
Name: Dawn Fuentes   MRN: [id number]    DOB: 03-31-1946   Date:05/24/2019       Progress Note  Subjective  Chief Complaint  Chief Complaint  Patient presents with  . Annual Exam    HPI   Patient presents for annual CPE   Diet: she does not have a calcium rich diet, she has a high protein diet Exercise: she recently had stress fracture of right foot, used to go to the [facility] but closed since March 2020 secondary to COVID-19  USPSTF grade A and B recommendations    Office Visit from 05/24/2019 in [facility]  AUDIT-C Score  0     Depression: Phq 9 is  negative Depression screen [facility] 2/9 05/24/2019 05/14/2019 05/03/2019 04/30/2019 10/10/2018  Decreased Interest 0 0 0 0 0  Down, Depressed, Hopeless 0 0 0 0 0  PHQ - 2 Score 0 0 0 0 0  Altered sleeping 1 0 1 3 0  Tired, decreased energy 0 0 0 0 0  Change in appetite 0 0 0 0 0  Feeling bad or failure about yourself  0 0 0 0 0  Trouble concentrating 0 0 0 0 0  Moving slowly or fidgety/restless 0 0 0 0 0  Suicidal thoughts 0 0 0 0 0  PHQ-9 Score 1 0 1 3 0  Difficult doing work/chores Not difficult at all Not difficult at all Not difficult at all Not difficult at all Not difficult at all  Some recent data might be hidden   Hypertension: BP Readings from Last 3 Encounters:  05/24/19 126/74  05/07/19 (!) 163/89  01/26/19 (!) 150/89   Obesity: Wt Readings from Last 3 Encounters:  05/24/19 174 lb 3.2 oz (79 kg)  01/26/19 166 lb (75.3 kg)  10/10/18 162 lb (73.5 kg)   BMI Readings from Last 3 Encounters:  05/24/19 28.55 kg/m  01/26/19 27.62 kg/m  10/10/18 26.15 kg/m    Hep C Screening: 01/2013 STD testing and prevention (HIV/chl/gon/syphilis): not sexually active  Intimate partner violence:negative screen  Sexual History/Pain during Intercourse: not sexually active  Menstrual History/LMP/Abnormal Bleeding: discussed post menopausal bleeding  Incontinence Symptoms: seeing PT right now, just started this  week   Advanced Care Planning: A voluntary discussion about advance care planning including the explanation and discussion of advance directives.  Discussed health care proxy and Living will, and the patient was able to identify a health care proxy as daughter   Patient does have a living will at present time.   Breast cancer:  HM Mammogram  Date Value Ref Range Status  09/06/2018 Self Reported Normal 0-4 Bi-Rad, Self Reported Normal Final  09/06/2018 0-4 Bi-Rad 0-4 Bi-Rad, Self Reported Normal Final    Comment:    [facility]- 1.5 cm asymmetry left breast-additional imaging    BRCA gene screening: N/A Cervical cancer screening: N/A  Osteoporosis Screening:  HM Dexa Scan  Date Value Ref Range Status  09/06/2018 Z score 0.4, T score -1.7 Left Femur  Final    Lipids:  Lab Results  Component Value Date   CHOL 159 05/21/2019   CHOL 163 03/13/2018   CHOL 156 03/08/2017   Lab Results  Component Value Date   HDL 53 05/21/2019   HDL 56 03/13/2018   HDL 70 03/08/2017   Lab Results  Component Value Date   LDLCALC 85 05/21/2019   LDLCALC 79 03/13/2018   LDLCALC 71 03/08/2017   Lab Results  Component Value Date  TRIG 111 05/21/2019   TRIG 179 (H) 03/13/2018   TRIG 73 03/08/2017   Lab Results  Component Value Date   CHOLHDL 3.0 05/21/2019   CHOLHDL 2.9 03/13/2018   CHOLHDL 2.2 03/08/2017   No results found for: LDLDIRECT  Glucose:  Glucose, Bld  Date Value Ref Range Status  05/21/2019 106 (H) 65 - 99 mg/dL Final    Comment:    .            Fasting reference interval . For someone without known diabetes, a glucose value between 100 and 125 mg/dL is consistent with prediabetes and should be confirmed with a follow-up test. .   03/13/2018 76 65 - 139 mg/dL Final    Comment:    .        Non-fasting reference interval .   09/06/2017 95 65 - 99 mg/dL Final    Comment:    .            Fasting reference interval .    Glucose-Capillary  Date Value Ref Range Status   09/17/2016 114 (H) 65 - 99 mg/dL Final    Skin cancer: discussed atypical lesion Colorectal cancer: 2016  Lung cancer:   Low Dose CT Chest recommended if Age 80-80 years, 30 pack-year currently smoking OR have quit w/in 15years. Patient does not qualify.   ECG: 2017   Patient Active Problem List   Diagnosis Date Noted  . [name] deficiency 12/26/2018  . Osteoarthritis of knees, bilateral 05/26/2017  . Primary open angle glaucoma of both eyes 11/21/2016  . Chronic kidney disease (CKD), stage III (moderate) ([place]) 04/06/2016  . Cystitis 10/12/2015  . Benign hypertension 08/21/2015  . Insomnia, persistent 08/21/2015  . Dyslipidemia 08/21/2015  . Gastro-esophageal reflux disease without esophagitis 08/21/2015  . Glaucoma 08/21/2015  . Blood glucose elevated 08/21/2015  . Adult hypothyroidism 08/21/2015  . Climacteric 08/21/2015  . Senile osteoporosis 08/21/2015  . Seasonal allergies 08/21/2015  . Female genuine stress incontinence 08/21/2015  . Vitamin D deficiency 08/21/2015    Past Surgical History:  Procedure Laterality Date  . ABDOMINAL HYSTERECTOMY     21 years ago  . APPENDECTOMY    . COLONOSCOPY WITH PROPOFOL N/A 05/29/2015   Procedure: COLONOSCOPY WITH PROPOFOL;  Surgeon: Hulen Luster, MD;  Location: [facility] ENDOSCOPY;  Service: Gastroenterology;  Laterality: N/A;  . ESOPHAGOGASTRODUODENOSCOPY N/A 05/29/2015   Procedure: ESOPHAGOGASTRODUODENOSCOPY (EGD);  Surgeon: Hulen Luster, MD;  Location: [facility] ENDOSCOPY;  Service: Gastroenterology;  Laterality: N/A;  . EYE SURGERY     eye lids  . OVARIAN CYST REMOVAL     needed transfusion  . PHOTOCOAGULATION WITH LASER Right 11/21/2016   Procedure: PHOTOCOAGULATION WITH LASER;  Surgeon: Ronnell Freshwater, MD;  Location: [place];  Service: Ophthalmology;  Laterality: Right;  A micropulse probe was applied to each hemilimbus with the following settings: 2042m, 31.3% duty cycle, 90 seconds x 3 applications.     Family History   Problem Relation Age of Onset  . Hypertension Mother   . Osteoporosis Mother   . COPD Mother   . Dementia Mother   . Cancer Father        Colon  . Hypothyroidism Daughter   . Aneurysm Brother   . COPD Sister     Social History   Socioeconomic History  . Marital status: Widowed    Spouse name: DShanon Brow . Number of children: 1  . Years of education: Not on file  . Highest education level:  12th grade  Occupational History  . Occupation: Retired  Scientific laboratory technician  . Financial resource strain: Not hard at all  . Food insecurity:    Worry: Never true    Inability: Never true  . Transportation needs:    Medical: No    Non-medical: No  Tobacco Use  . Smoking status: Never Smoker  . Smokeless tobacco: Never Used  . Tobacco comment: smoking cessation materials not required  Substance and Sexual Activity  . Alcohol use: No    Alcohol/week: 0.0 standard drinks  . Drug use: No  . Sexual activity: Not Currently    Partners: Male  Lifestyle  . Physical activity:    Days per week: 3 days    Minutes per session: 60 min  . Stress: Not at all  Relationships  . Social connections:    Talks on phone: Patient refused    Gets together: Patient refused    Attends religious service: Patient refused    Active member of club or organization: Patient refused    Attends meetings of clubs or organizations: Patient refused    Relationship status: Widowed  . Intimate partner violence:    Fear of current or ex partner: No    Emotionally abused: No    Physically abused: No    Forced sexual activity: No  Other Topics Concern  . Not on file  Social History Narrative   Widow since 2013   Started to date in 2019     Current Outpatient Medications:  .  ALPRAZolam (XANAX) 0.5 MG tablet, TAKE 1 TABLET BY MOUTH AT BEDTIME AS NEEDED FOR ANXIETY(MUST LAST 90 DAYS), Disp: 10 tablet, Rfl: 0 .  aspirin 81 MG tablet, Take 81 mg by mouth daily. pm, Disp: , Rfl:  .  cholecalciferol (VITAMIN D) 1000  units tablet, Take 1,000 Units by mouth 2 (two) times daily. am, Disp: , Rfl:  .  citalopram (CELEXA) 10 MG tablet, Take 1 tablet (10 mg total) by mouth daily., Disp: 90 tablet, Rfl: 0 .  denosumab (PROLIA) 60 MG/ML SOSY injection, Inject into the skin., Disp: , Rfl:  .  fluticasone (FLONASE) 50 MCG/ACT nasal spray, Place 2 sprays into both nostrils daily. Pm, Disp: 16 g, Rfl: 2 .  INTRAROSA 6.5 MG INST, I 1 SUP VAG HS, Disp: , Rfl:  .  levocetirizine (XYZAL) 5 MG tablet, Take 1 tablet (5 mg total) by mouth every evening., Disp: 90 tablet, Rfl: 1 .  levothyroxine (SYNTHROID) 50 MCG tablet, TAKE 1 TABLET BY MOUTH EVERY DAY BEFORE BREAKFAST AND TAKE 2 TABLETS ON SUNDAY, Disp: 102 tablet, Rfl: 1 .  losartan (COZAAR) 50 MG tablet, TAKE 1 TABLET BY MOUTH DAILY, Disp: 90 tablet, Rfl: 0 .  Melatonin 3 MG CAPS, Take 1 capsule (3 mg total) by mouth daily., Disp: 90 capsule, Rfl: 0 .  montelukast (SINGULAIR) 10 MG tablet, Take 1 tablet (10 mg total) by mouth daily as needed (allergies)., Disp: 90 tablet, Rfl: 1 .  Omega-3 Fatty Acids (FISH OIL PO), Take by mouth. AM, Disp: , Rfl:  .  pantoprazole (PROTONIX) 40 MG tablet, Take 1 tablet (40 mg total) by mouth daily., Disp: 90 tablet, Rfl: 1 .  simvastatin (ZOCOR) 40 MG tablet, Take 1 tablet (40 mg total) by mouth daily. pm, Disp: 90 tablet, Rfl: 1 .  timolol (TIMOPTIC) 0.5 % ophthalmic solution, INT 1 GTT IN OU BID, Disp: , Rfl: 5 .  VYZULTA 0.024 % SOLN, INT 1 GTT INTO OU AT NIGHT,  Disp: , Rfl: 2 .  zolpidem (AMBIEN) 5 MG tablet, TAKE 1 TABLET(5 MG) BY MOUTH AT BEDTIME AS NEEDED FOR SLEEP, Disp: 90 tablet, Rfl: 0 .  mirabegron ER (MYRBETRIQ) 25 MG TB24 tablet, Take 1 tablet (25 mg total) by mouth daily. (Patient not taking: Reported on 05/24/2019), Disp: 30 tablet, Rfl: 0 .  tiZANidine (ZANAFLEX) 4 MG tablet, Take 0.5-1 tablets (2-4 mg total) by mouth every 8 (eight) hours as needed for muscle spasms. (Patient not taking: Reported on 05/24/2019), Disp: 20 tablet,  Rfl: 0  Current Facility-Administered Medications:  .  cyanocobalamin ((VITAMIN B-12)) injection 1,000 mcg, 1,000 mcg, Intramuscular, Q30 days, Steele Sizer, MD, 1,000 mcg at 05/21/19 1040  Allergies  Allergen Reactions  . Brimonidine Tartrate   . Latanoprost   . Raloxifene     bone pain  . Sulfa Antibiotics Nausea Only    Intolerance rather than allergy.     ROS  Constitutional: Negative for fever, positive for weight change.  Respiratory: Negative for cough and shortness of breath.   Cardiovascular: Negative for chest pain or palpitations.  Gastrointestinal: Negative for abdominal pain, no bowel changes.  Musculoskeletal: Negative for gait problem or joint swelling.  Skin: Negative for rash.  Neurological: Negative for dizziness or headache.  No other specific complaints in a complete review of systems (except as listed in HPI above).  Objective  Vitals:   05/24/19 1119  BP: 126/74  Pulse: 85  Resp: 16  Temp: 97.8 F (36.6 C)  TempSrc: Oral  SpO2: 96%  Weight: 174 lb 3.2 oz (79 kg)  Height: 5' 5.5" (1.664 m)    Body mass index is 28.55 kg/m.  Physical Exam  Constitutional: Patient appears well-developed and well-nourished. No distress.  HENT: Head: Normocephalic and atraumatic. Ears: B TMs ok, no erythema or effusion; Nose: Nose normal. Mouth/Throat: Oropharynx is clear and moist. No oropharyngeal exudate.  Eyes: Conjunctivae and EOM are normal. Pupils are equal, round, and reactive to light. No scleral icterus.  Neck: Normal range of motion. Neck supple. No JVD present. No thyromegaly present.  Cardiovascular: Normal rate, regular rhythm and normal heart sounds.  No murmur heard. No BLE edema. Pulmonary/Chest: Effort normal and breath sounds normal. No respiratory distress. Abdominal: Soft. Bowel sounds are normal, no distension. There is no tenderness. no masses Breast: lumpy breasts, no nipple discharge or rashes FEMALE GENITALIA:  Not done  RECTAL:  not done Musculoskeletal: Normal range of motion, no joint effusions. No gross deformities Neurological: he is alert and oriented to person, place, and time. No cranial nerve deficit. Coordination, balance, strength, speech and gait are normal.  Skin: Skin is warm and dry. No rash noted. No erythema.  Psychiatric: Patient has a normal mood and affect. behavior is normal. Judgment and thought content normal.  Recent Results (from the past 2160 hour(s))  TSH     Status: None   Collection Time: 05/21/19 10:31 AM  Result Value Ref Range   TSH 3.81 0.40 - 4.50 mIU/L  Lipid panel     Status: None   Collection Time: 05/21/19 10:31 AM  Result Value Ref Range   Cholesterol 159 <200 mg/dL   HDL 53 > OR = 50 mg/dL   Triglycerides 111 <150 mg/dL   LDL Cholesterol (Calc) 85 mg/dL (calc)    Comment: Reference range: <100 . Desirable range <100 mg/dL for primary prevention;   <70 mg/dL for patients with CHD or diabetic patients  with > or = 2 CHD risk factors. Marland Kitchen  LDL-C is now calculated using the Martin-Hopkins  calculation, which is a validated novel method providing  better accuracy than the Friedewald equation in the  estimation of LDL-C.  Cresenciano Genre et al. Annamaria Helling. 7793;903(00): 2061-2068  (http://education.[facility].com/faq/FAQ164)    Total CHOL/HDL Ratio 3.0 <5.0 (calc)   Non-HDL Cholesterol (Calc) 106 <130 mg/dL (calc)    Comment: For patients with diabetes plus 1 major ASCVD risk  factor, treating to a non-HDL-C goal of <100 mg/dL  (LDL-C of <70 mg/dL) is considered a therapeutic  option.   COMPLETE METABOLIC PANEL WITH GFR     Status: Abnormal   Collection Time: 05/21/19 10:31 AM  Result Value Ref Range   Glucose, Bld 106 (H) 65 - 99 mg/dL    Comment: .            Fasting reference interval . For someone without known diabetes, a glucose value between 100 and 125 mg/dL is consistent with prediabetes and should be confirmed with a follow-up test. .    BUN 21 7 - 25 mg/dL    Creat 1.23 (H) 0.60 - 0.93 mg/dL    Comment: For patients >45 years of age, the reference limit for Creatinine is approximately 13% higher for people identified as African-American. .    GFR, Est Non African American 44 (L) > OR = 60 mL/min/1.18m   GFR, Est African American 51 (L) > OR = 60 mL/min/1.78m  BUN/Creatinine Ratio 17 6 - 22 (calc)   Sodium 140 135 - 146 mmol/L   Potassium 4.1 3.5 - 5.3 mmol/L   Chloride 105 98 - 110 mmol/L   CO2 27 20 - 32 mmol/L   Calcium 9.4 8.6 - 10.4 mg/dL   Total Protein 6.5 6.1 - 8.1 g/dL   Albumin 4.3 3.6 - 5.1 g/dL   Globulin 2.2 1.9 - 3.7 g/dL (calc)   AG Ratio 2.0 1.0 - 2.5 (calc)   Total Bilirubin 0.4 0.2 - 1.2 mg/dL   Alkaline phosphatase (APISO) 47 37 - 153 U/L   AST 29 10 - 35 U/L   ALT 33 (H) 6 - 29 U/L  CBC with Differential/Platelet     Status: None   Collection Time: 05/21/19 10:31 AM  Result Value Ref Range   WBC 5.4 3.8 - 10.8 Thousand/uL   RBC 4.04 3.80 - 5.10 Million/uL   Hemoglobin 12.5 11.7 - 15.5 g/dL   HCT 37.6 35.0 - 45.0 %   MCV 93.1 80.0 - 100.0 fL   MCH 30.9 27.0 - 33.0 pg   MCHC 33.2 32.0 - 36.0 g/dL   RDW 12.3 11.0 - 15.0 %   Platelets 259 140 - 400 Thousand/uL   MPV 11.3 7.5 - 12.5 fL   Neutro Abs 3,035 1,500 - 7,800 cells/uL   Lymphs Abs 1,377 850 - 3,900 cells/uL   Absolute Monocytes 648 200 - 950 cells/uL   Eosinophils Absolute 232 15 - 500 cells/uL   Basophils Absolute 108 0 - 200 cells/uL   Neutrophils Relative % 56.2 %   Total Lymphocyte 25.5 %   Monocytes Relative 12.0 %   Eosinophils Relative 4.3 %   Basophils Relative 2.0 %  [name] and Folate Panel     Status: None   Collection Time: 05/21/19 10:31 AM  Result Value Ref Range   Vitamin B-12 415 200 - 1,100 pg/mL   Folate 12.5 ng/mL    Comment:  Reference Range                            Low:           <3.4                            Borderline:    3.4-5.4                            Normal:        >5.4 .   VITAMIN D  25 Hydroxy (Vit-D Deficiency, Fractures)     Status: None   Collection Time: 05/21/19 10:31 AM  Result Value Ref Range   Vit D, 25-Hydroxy 52 30 - 100 ng/mL    Comment: Vitamin D Status         25-OH Vitamin D: . Deficiency:                    <20 ng/mL Insufficiency:             20 - 29 ng/mL Optimal:                 > or = 30 ng/mL . For 25-OH Vitamin D testing on patients on  D2-supplementation and patients for whom quantitation  of D2 and D3 fractions is required, the [facility](TM) 25-OH VIT D, (D2,D3), LC/MS/MS is recommended: order  code [id number] (patients >78yr). See Note 1 . Note 1 . For additional information, please refer to  http://education.[facility].com/faq/FAQ199  (This link is being provided for informational/ educational purposes only.)       PHQ2/9: [facility] 05/24/2019 05/14/2019 05/03/2019 04/30/2019 10/10/2018  Decreased Interest 0 0 0 0 0  Down, Depressed, Hopeless 0 0 0 0 0  PHQ - 2 Score 0 0 0 0 0  Altered sleeping 1 0 1 3 0  Tired, decreased energy 0 0 0 0 0  Change in appetite 0 0 0 0 0  Feeling bad or failure about yourself  0 0 0 0 0  Trouble concentrating 0 0 0 0 0  Moving slowly or fidgety/restless 0 0 0 0 0  Suicidal thoughts 0 0 0 0 0  PHQ-9 Score 1 0 1 3 0  Difficult doing work/chores Not difficult at all Not difficult at all Not difficult at all Not difficult at all Not difficult at all  Some recent data might be hidden   Negative   Fall Risk: Fall Risk  05/24/2019 05/14/2019 05/03/2019 04/30/2019 10/10/2018  Falls in the past year? 0 0 0 0 No  Number falls in past yr: 0 0 0 0 -  Injury with Fall? 0 0 0 0 -  Risk for fall due to : - - - - -  Risk for fall due to: Comment - - - - -  Follow up - - Falls evaluation completed - -     Functional Status Survey: Is the patient deaf or have difficulty hearing?: No Does the patient have difficulty seeing, even when wearing glasses/contacts?: Yes Does the patient have  difficulty concentrating, remembering, or making decisions?: No Does the patient have difficulty walking or climbing stairs?: Yes Does the patient have difficulty dressing or bathing?: No Does the patient have difficulty doing errands alone such as visiting a doctor's office or shopping?: Yes   Assessment & Plan  1. Well woman exam  Up to date with colonoscopy, no need for pap smear- s/p hysterectomy , mammogram [facility]    -USPSTF grade A and B recommendations reviewed with patient; age-appropriate recommendations, preventive care, screening tests, etc discussed and encouraged; healthy living encouraged; see AVS for patient education given to patient -Discussed importance of 150 minutes of physical activity weekly, eat two servings of fish weekly, eat one serving of tree nuts ( cashews, pistachios, pecans, almonds.Marland Kitchen) every other day, eat 6 servings of fruit/vegetables daily and drink plenty of water and avoid sweet beverages.

## 2019-05-27 DIAGNOSIS — H401132 Primary open-angle glaucoma, bilateral, moderate stage: Secondary | ICD-10-CM | POA: Diagnosis not present

## 2019-05-29 ENCOUNTER — Ambulatory Visit: Payer: PPO

## 2019-05-29 ENCOUNTER — Ambulatory Visit: Payer: PPO | Admitting: Podiatry

## 2019-05-29 ENCOUNTER — Other Ambulatory Visit: Payer: Self-pay

## 2019-05-29 DIAGNOSIS — R293 Abnormal posture: Secondary | ICD-10-CM

## 2019-05-29 DIAGNOSIS — M6281 Muscle weakness (generalized): Secondary | ICD-10-CM

## 2019-05-29 DIAGNOSIS — R278 Other lack of coordination: Secondary | ICD-10-CM

## 2019-05-29 DIAGNOSIS — M62838 Other muscle spasm: Secondary | ICD-10-CM

## 2019-05-29 NOTE — Therapy (Signed)
[place] MAIN [facility] SERVICES [address] [place], [place], [address] Phone: [phone]   Fax:  [phone]  Physical Therapy Treatment  The patient has been informed of current processes in place at Outpatient Rehab to protect patients from Covid-19 exposure including social distancing, schedule modifications, and new cleaning procedures. After discussing their particular risk with a therapist based on the patient's personal risk factors, the patient has decided to proceed with in-person therapy.   Patient Details  Name: Dawn Fuentes MRN: [id number] Date of Birth: 04-04-1946 Referring Provider (PT): Steele Sizer   Encounter Date: 05/29/2019  PT End of Session - 05/29/19 1313    Visit Number  2    Number of Visits  10    Date for PT Re-Evaluation  07/31/19    Authorization Time Period  through 07/31/2019    Authorization - Visit Number  2    Authorization - Number of Visits  10    PT Start Time  9242    PT Stop Time  1135    PT Time Calculation (min)  60 min    Activity Tolerance  Patient tolerated treatment well;No increased pain    Behavior During Therapy  WFL for tasks assessed/performed       Past Medical History:  Diagnosis Date  . Allergy   . Anxiety    ER visit in Sept 2017 for anxiety  . Arthritis   . Chronic insomnia   . Colon adenoma   . Dyslipidemia   . Dysphagia   . Female stress incontinence   . GERD (gastroesophageal reflux disease)   . [facility], both eyes    [facility]  . Hyperlipidemia   . Hypertension    controlled on meds  . Hypothyroidism   . Menopause syndrome   . Osteoporosis   . Polyp of colon   . Renal insufficiency    mild- keeping an eye on  . Vitamin D deficiency     Past Surgical History:  Procedure Laterality Date  . ABDOMINAL HYSTERECTOMY     21 years ago  . APPENDECTOMY    . COLONOSCOPY WITH PROPOFOL N/A 05/29/2015   Procedure: COLONOSCOPY WITH PROPOFOL;   Surgeon: Hulen Luster, MD;  Location: [facility] ENDOSCOPY;  Service: Gastroenterology;  Laterality: N/A;  . ESOPHAGOGASTRODUODENOSCOPY N/A 05/29/2015   Procedure: ESOPHAGOGASTRODUODENOSCOPY (EGD);  Surgeon: Hulen Luster, MD;  Location: [facility] ENDOSCOPY;  Service: Gastroenterology;  Laterality: N/A;  . EYE SURGERY     eye lids  . OVARIAN CYST REMOVAL     needed transfusion  . PHOTOCOAGULATION WITH LASER Right 11/21/2016   Procedure: PHOTOCOAGULATION WITH LASER;  Surgeon: Ronnell Freshwater, MD;  Location: [facility];  Service: Ophthalmology;  Laterality: Right;  A micropulse probe was applied to each hemilimbus with the following settings: 2074mW, 31.3% duty cycle, 90 seconds x 3 applications.     There were no vitals filed for this visit.    Pelvic Floor Physical Therapy Treatment Note  SCREENING  Changes in medications, allergies, or medical history?: no    SUBJECTIVE  Patient reports: She is doing ok, no change symptom wise, will be in boot for 1 more month.  Precautions:  See History  Pain update: Location of pain: R SIJ Current pain: 0/10  Max pain: 3/10 Least pain: 0/10 Nature of pain:deep ache  Patient Goals: To be able to not have to wear Pads and not have urinary incontinence.   OBJECTIVE  Changes  in: Posture/Observations:  R hip up-slip and hyperlordosis  Abdominal:  Pt. Has a hard time coordinating diaphragmatic breathing, able to do with ~ 50% accuracy following cueing/education.  Palpation: Highly TTP to B adductors, reduced by ~25% following treatment.  Gait Analysis: Severe vaulting due to boot+leg-length discrepancy, improved following addition of heel-lift but still needs shoe riser due to boot.  INTERVENTIONS THIS SESSION: Manual: Performed TP release to B adductors to begin reducing spasms acting on the pelvis and allow for decreased PFM spasm to increase function and decreased SUI/UUI. Therex: Educated on and practiced diaphragmatic  breathing, tall kneeling squats, and TA in Quad as well as 3-way wall stretch to improve balance of musculature surrounding the pelvis and decrease spasm/pain.  Self-care: Educated on urge-suppression technique to decrease Urge incontinence and vaginal suppositories to improve vaginal moisture and reduce pain with intercourse. Educated on where to and why to buy a shoe riser and heel lifts to improve pelvic alignment and allow for spasm reduction, decreased SUI and pain.  Total time: 60 min.                           PT Short Term Goals - 05/23/19 1205      PT SHORT TERM GOAL #1   Title  Patient will demonstrate improved pelvic alignment and balance of musculature surrounding the pelvis to facilitate decreased PFM spasms and improve PFM function for decreased SUI.    Baseline  spasms surrounding the pelvis and RLE long by 1.5 cm.    Time  5    Period  Weeks    Status  New    Target Date  06/27/19      PT SHORT TERM GOAL #2   Title  Patient will report consistent use of foot-stool (squatty-potty) for positioning with BM to decrease pain with BM and intra-abdominal pressure    Baseline  Pt. straining to have BM daily    Time  5    Period  Weeks    Status  New    Target Date  06/27/19      PT SHORT TERM GOAL #3   Title  Pt. will drink 64 oz. of water per day, decrease caffinated beverages to 1 per day and successfully implement urge suppression technique to decrease urge incontinence.    Baseline  Pt. only drinking 24 oz of water with other fluids being caffinated. not aware of urge suppression technique.    Time  5    Period  Weeks    Status  New    Target Date  06/27/19        PT Long Term Goals - 05/23/19 1201      PT LONG TERM GOAL #1   Title  Patient will report no episodes of SUI or UUI over the course of the prior two weeks to demonstrate improved functional ability.    Baseline  Having SUI and UUI daily, using 2-3 #3 pads daily.    Time  10     Period  Weeks    Status  New    Target Date  08/01/19      PT LONG TERM GOAL #2   Title  Patient will score at or below 60/300 on the PFDI to demonstrate a clinically meaningful decrease in disability and distress due to pelvic floor dysfunction.    Baseline  PFDI: 105/300     Time  10    Period  Weeks  Status  New    Target Date  08/01/19      PT LONG TERM GOAL #3   Title  Patient will report having BM's at least every-other day with consistency between [facility] stool scale 3-5 over the prior week to demonstrate decreased constipation.    Baseline  Having to strain to have a BM, type 1 stool.    Time  10    Period  Weeks    Status  New    Target Date  08/01/19            Plan - 05/29/19 1314    Clinical Impression Statement  Pt. responded well to all interventions today, demonstrating understanding and correct performance of all exercises with moderate cueing, slight decrease in spasms, and undersanding of all education provided with intent to follow-through with suggestions. Continue per POC.    PT Next Visit Plan  align pelvis and Perform TP release (discuss DN) to B hip-flexors and adductors, review tall kneeling squats, give stretches for lower crossed syndrome and hip abd strengthening.    PT Home Exercise Plan  3-way wall stretch, TA in quad, tall kneeling squats, urge suppression, heel-lift in L shoe (waiting for shoe lift too).    Consulted and Agree with Plan of Care  Patient       Patient will benefit from skilled therapeutic intervention in order to improve the following deficits and impairments:     Visit Diagnosis: Abnormal posture  Other muscle spasm  Muscle weakness (generalized)  Other lack of coordination     Problem List Patient Active Problem List   Diagnosis Date Noted  . [name] deficiency 12/26/2018  . Osteoarthritis of knees, bilateral 05/26/2017  . Primary open angle glaucoma of both eyes 11/21/2016  . Chronic kidney disease (CKD), stage  III (moderate) ([place]) 04/06/2016  . Cystitis 10/12/2015  . Benign hypertension 08/21/2015  . Insomnia, persistent 08/21/2015  . Dyslipidemia 08/21/2015  . Gastro-esophageal reflux disease without esophagitis 08/21/2015  . Glaucoma 08/21/2015  . Blood glucose elevated 08/21/2015  . Adult hypothyroidism 08/21/2015  . Climacteric 08/21/2015  . Senile osteoporosis 08/21/2015  . Seasonal allergies 08/21/2015  . Female genuine stress incontinence 08/21/2015  . Vitamin D deficiency 08/21/2015   Willa Rough DPT, ATC Willa Rough 05/29/2019, 1:18 PM  [place] [facility] MAIN [facility] SERVICES [address] [place], [place], [address] Phone: [phone]   Fax:  [phone]  Name: Dawn Fuentes MRN: [id number] Date of Birth: 12-Jun-1946

## 2019-05-29 NOTE — Patient Instructions (Addendum)
   Vitamin E suppository for Vaginal Dryness.  Urge supression technique:  1) Take a deep breath to convince yourself that you are in control and calm the nervous system.  2) Do 5 "quick-flick" kegels (pelvic floor muscle contractions) and re-assess the urge. Repeat another set if urge is still present. 3) Once the urge has decreased, start walking calmly to the the bathroom. Stop and repeat steps 1 and 2 as many times as needed until you can successfully get to the toilet. 4) Only once seated, take a deep breath and allow the pelvic floor muscles to relax and allow for the urine to flow.    Do not be discouraged if you are not successful the first couple times, this is normal and it will take practice but remember that YOU are in control. Start by practicing this at home where you do not have to worry as much if there were to be an accident. Allowing yourself to get rushed or nervous puts the bladder back in control and will not allow the technique to work.  Stabilization: Diaphragmatic Breathing    Lie with knees bent, feet flat. Place one hand on stomach, other on chest. Breathe deeply through nose, lifting belly hand without any motion of hand on chest. Do this for at least ~ 5 min each night.  Repeat as frequently as you can.

## 2019-06-02 ENCOUNTER — Ambulatory Visit
Admission: EM | Admit: 2019-06-02 | Discharge: 2019-06-02 | Disposition: A | Discharge status: Home / Self Care | Payer: PPO | Attending: Family Medicine | Admitting: Family Medicine

## 2019-06-02 DIAGNOSIS — N390 Urinary tract infection, site not specified: Secondary | ICD-10-CM | POA: Diagnosis not present

## 2019-06-02 DIAGNOSIS — I10 Essential (primary) hypertension: Secondary | ICD-10-CM

## 2019-06-02 DIAGNOSIS — R319 Hematuria, unspecified: Secondary | ICD-10-CM | POA: Diagnosis not present

## 2019-06-02 DIAGNOSIS — B9689 Other specified bacterial agents as the cause of diseases classified elsewhere: Secondary | ICD-10-CM | POA: Diagnosis not present

## 2019-06-02 LAB — URINALYSIS, COMPLETE (UACMP) WITH MICROSCOPIC
Bilirubin Urine: NEGATIVE
Glucose, UA: NEGATIVE mg/dL
Ketones, ur: NEGATIVE mg/dL
Nitrite: NEGATIVE
Protein, ur: NEGATIVE mg/dL
Specific Gravity, Urine: 1.01 (ref 1.005–1.030)
WBC, UA: >50 WBC/hpf (ref 0–5)
pH: 5.5 (ref 5.0–8.0)

## 2019-06-02 MED ORDER — CEPHALEXIN 500 MG PO CAPS: 500.0000 mg | ORAL_CAPSULE | Freq: Two times a day (BID) | ORAL | 0 refills | Status: DC

## 2019-06-02 NOTE — ED Triage Notes (Signed)
Pt here for UTI. Having urgency, cloudy urine and vaginal discomfort. Did do a home test and it was positive. Did take tylenol otc with some relief.

## 2019-06-02 NOTE — ED Provider Notes (Signed)
[facility]    CSN: [id number] Arrival date & time: 06/02/19  1255     History   Chief Complaint Chief Complaint  Patient presents with  . Urinary Tract Infection    HPI Dawn Fuentes is a 73 y.o. female.   73 yo female with a c/o urgent urination, cloudy urine and mild discomfort with urination since this morning. Denies any fevers, chills, vomiting, abdominal pain.    Urinary Tract Infection   Past Medical History:  Diagnosis Date  . Allergy   . Anxiety    ER visit in Sept 2017 for anxiety  . Arthritis   . Chronic insomnia   . Colon adenoma   . Dyslipidemia   . Dysphagia   . Female stress incontinence   . GERD (gastroesophageal reflux disease)   . [facility]aucoma, both eyes    [facility]  . Hyperlipidemia   . Hypertension    controlled on meds  . Hypothyroidism   . Menopause syndrome   . Osteoporosis   . Polyp of colon   . Renal insufficiency    mild- keeping an eye on  . Vitamin D deficiency     Patient Active Problem List   Diagnosis Date Noted  . [name] deficiency 12/26/2018  . Osteoarthritis of knees, bilateral 05/26/2017  . Primary open angle glaucoma of both eyes 11/21/2016  . Chronic kidney disease (CKD), stage III (moderate) ([place]) 04/06/2016  . Cystitis 10/12/2015  . Benign hypertension 08/21/2015  . Insomnia, persistent 08/21/2015  . Dyslipidemia 08/21/2015  . Gastro-esophageal reflux disease without esophagitis 08/21/2015  . Glaucoma 08/21/2015  . Blood glucose elevated 08/21/2015  . Adult hypothyroidism 08/21/2015  . Climacteric 08/21/2015  . Senile osteoporosis 08/21/2015  . Seasonal allergies 08/21/2015  . Female genuine stress incontinence 08/21/2015  . Vitamin D deficiency 08/21/2015    Past Surgical History:  Procedure Laterality Date  . ABDOMINAL HYSTERECTOMY     21 years ago  . APPENDECTOMY    . COLONOSCOPY WITH PROPOFOL N/A 05/29/2015   Procedure: COLONOSCOPY WITH PROPOFOL;  Surgeon: Hulen Luster, MD;  Location[facility] ENDOSCOPY;  Service: Gastroenterology;  Laterality: N/A;  . ESOPHAGOGASTRODUODENOSCOPY N/A 05/29/2015   Procedure: ESOPHAGOGASTRODUODENOSCOPY (EGD);  Surgeon: Hulen Luster, MD;  Location: [facility] ENDOSCOPY;  Service: Gastroenterology;  Laterality: N/A;  . EYE SURGERY     eye lids  . OVARIAN CYST REMOVAL     needed transfusion  . PHOTOCOAGULATION WITH LASER Right 11/21/2016   Procedure: PHOTOCOAGULATION WITH LASER;  Surgeon: Ronnell Freshwater, MD;  Location: [place];  Service: Ophthalmology;  Laterality: Right;  A micropulse probe was applied to each hemilimbus with the following settings: 2093mW, 31.3% duty cycle, 90 seconds x 3 applications.     OB History    Gravida  1   Para  1   Term  1   Preterm      AB      Living  1     SAB      TAB      Ectopic      Multiple      Live Births               Home Medications    Prior to Admission medications   Medication Sig Start Date End Date Taking? Authorizing Provider  ALPRAZolam Duanne Moron) 0.5 MG tablet Take 1 tablet (0.5 mg total) by mouth at bedtime as needed for anxiety. 05/24/19  Yes Steele Sizer, MD  aspirin 81 MG tablet Take 81 mg by mouth daily. pm   Yes [provider]  cholecalciferol (VITAMIN D) 1000 units tablet Take 1,000 Units by mouth 2 (two) times daily. am   Yes [provider]  citalopram (CELEXA) 10 MG tablet Take 1 tablet (10 mg total) by mouth daily. 04/30/19  Yes Sowles, Drue Stager, MD  denosumab (PROLIA) 60 MG/ML SOSY injection Inject into the skin. 04/16/19  Yes [provider]  fluticasone (FLONASE) 50 MCG/ACT nasal spray Place 2 sprays into both nostrils daily. Pm 09/11/18  Yes Sowles, Drue Stager, MD  INTRAROSA 6.5 MG INST I 1 SUP VAG HS 01/03/19  Yes Rod Can, CNM  levocetirizine (XYZAL) 5 MG tablet Take 1 tablet (5 mg total) by mouth every evening. 04/30/19  Yes Sowles, Drue Stager, MD  levothyroxine (SYNTHROID) 50 MCG tablet TAKE 1 TABLET BY  MOUTH EVERY DAY BEFORE BREAKFAST AND TAKE 2 TABLETS ON SUNDAY 04/30/19  Yes Sowles, Drue Stager, MD  losartan (COZAAR) 50 MG tablet TAKE 1 TABLET BY MOUTH DAILY 04/30/19  Yes Sowles, Drue Stager, MD  Melatonin 3 MG CAPS Take 1 capsule (3 mg total) by mouth daily. 09/06/17  Yes Sowles, Drue Stager, MD  montelukast (SINGULAIR) 10 MG tablet Take 1 tablet (10 mg total) by mouth daily as needed (allergies). 04/30/19  Yes Sowles, Drue Stager, MD  Omega-3 Fatty Acids (FISH OIL PO) Take by mouth. AM   Yes [provider]  pantoprazole (PROTONIX) 40 MG tablet Take 1 tablet (40 mg total) by mouth daily. 04/30/19  Yes Sowles, Drue Stager, MD  simvastatin (ZOCOR) 40 MG tablet Take 1 tablet (40 mg total) by mouth daily. pm 04/30/19  Yes Sowles, Drue Stager, MD  timolol (TIMOPTIC) 0.5 % ophthalmic solution INT 1 GTT IN OU BID 04/03/17  Yes [provider]  VYZULTA 0.024 % SOLN INT 1 GTT INTO OU AT NIGHT 08/24/18  Yes [provider]  zolpidem (AMBIEN) 5 MG tablet TAKE 1 TABLET(5 MG) BY MOUTH AT BEDTIME AS NEEDED FOR SLEEP 04/30/19  Yes Sowles, Drue Stager, MD  cephALEXin (KEFLEX) 500 MG capsule Take 1 capsule (500 mg total) by mouth 2 (two) times daily. 06/02/19   Norval Gable, MD    Family History Family History  Problem Relation Age of Onset  . Hypertension Mother   . Osteoporosis Mother   . COPD Mother   . Dementia Mother   . Cancer Father        Colon  . Hypothyroidism Daughter   . Aneurysm Brother   . COPD Sister     Social History Social History   Tobacco Use  . Smoking status: Never Smoker  . Smokeless tobacco: Never Used  . Tobacco comment: smoking cessation materials not required  Substance Use Topics  . Alcohol use: No    Alcohol/week: 0.0 standard drinks  . Drug use: No     Allergies   Brimonidine tartrate, Latanoprost, Raloxifene, and Sulfa antibiotics   Review of Systems Review of Systems   Physical Exam Triage Vital Signs ED Triage Vitals  Enc Vitals Group     BP 06/02/19  1308 (!) 165/83     Pulse Rate 06/02/19 1308 93     Resp 06/02/19 1308 18     Temp 06/02/19 1308 98.3 F (36.8 C)     Temp Source 06/02/19 1308 Oral     SpO2 06/02/19 1308 97 %     Weight 06/02/19 1311 171 lb (77.6 kg)     Height 06/02/19 1311 5\' 5"  (1.651 m)  Head Circumference --      Peak Flow --      Pain Score 06/02/19 1310 0     Pain Loc --      Pain Edu? --      Excl. in [place]? --    No data found.  Updated Vital Signs BP (!) 165/83 (BP Location: Right Arm)   Pulse 93   Temp 98.3 F (36.8 C) (Oral)   Resp 18   Ht 5\' 5"  (1.651 m)   Wt 77.6 kg   SpO2 97%   BMI 28.46 kg/m   Visual Acuity Right Eye Distance:   Left Eye Distance:   Bilateral Distance:    Right Eye Near:   Left Eye Near:    Bilateral Near:     Physical Exam Vitals signs and nursing note reviewed.  Constitutional:      General: She is not in acute distress.    Appearance: She is not toxic-appearing or diaphoretic.  Abdominal:     General: There is no distension.     Palpations: Abdomen is soft.  Neurological:     Mental Status: She is alert.      UC Treatments / Results  Labs (all labs ordered are listed, but only abnormal results are displayed) Labs Reviewed  URINALYSIS, COMPLETE (UACMP) WITH MICROSCOPIC - Abnormal; Notable for the following components:      Result Value   APPearance HAZY (*)    Hgb urine dipstick TRACE (*)    Leukocytes,Ua MODERATE (*)    Bacteria, UA FEW (*)    All other components within normal limits  URINE CULTURE    EKG None  Radiology No results found.  Procedures Procedures (including critical care time)  Medications Ordered in UC Medications - No data to display  Initial Impression / Assessment and Plan / UC Course  I have reviewed the triage vital signs and the nursing notes.  Pertinent labs & imaging results that were available during my care of the patient were reviewed by me and considered in my medical decision making (see chart for  details).      Final Clinical Impressions(s) / UC Diagnoses   Final diagnoses:  Urinary tract infection with hematuria, site unspecified     Discharge Instructions     Increase water intake    ED Prescriptions    Medication Sig Dispense Auth. Provider   cephALEXin (KEFLEX) 500 MG capsule Take 1 capsule (500 mg total) by mouth 2 (two) times daily. 14 capsule Norval Gable, MD     1. Lab results and diagnosis reviewed with patient 2. rx as per orders above; reviewed possible side effects, interactions, risks and benefits  3. Recommend supportive treatment as above  4. Follow-up prn if symptoms worsen or don't improve  Controlled Substance Prescriptions [place] Controlled Substance Registry consulted? Not Applicable   Norval Gable, MD 06/02/19 1350

## 2019-06-02 NOTE — Discharge Instructions (Signed)
Increase water intake

## 2019-06-05 ENCOUNTER — Ambulatory Visit: Payer: PPO

## 2019-06-05 LAB — URINE CULTURE
Culture: 50000 — AB
Special Requests: NORMAL

## 2019-06-07 ENCOUNTER — Telehealth (HOSPITAL_COMMUNITY): Payer: Self-pay | Admitting: Emergency Medicine

## 2019-06-07 NOTE — Telephone Encounter (Signed)
Urine culture was positive for e coli and was given keflex  at urgent care visit. Attempted to reach patient. No answer at this time.   

## 2019-06-12 ENCOUNTER — Ambulatory Visit: Payer: PPO

## 2019-06-12 ENCOUNTER — Other Ambulatory Visit: Payer: Self-pay

## 2019-06-12 DIAGNOSIS — M6281 Muscle weakness (generalized): Secondary | ICD-10-CM

## 2019-06-12 DIAGNOSIS — M62838 Other muscle spasm: Secondary | ICD-10-CM

## 2019-06-12 DIAGNOSIS — R278 Other lack of coordination: Secondary | ICD-10-CM

## 2019-06-12 DIAGNOSIS — R293 Abnormal posture: Secondary | ICD-10-CM | POA: Diagnosis not present

## 2019-06-12 NOTE — Patient Instructions (Signed)
   Breathe in, let belly relax down toward the floor and then breathe out, pulling the lower belly in toward the backbone.   Repeat this _3x10__ times _1-2__ times per day

## 2019-06-12 NOTE — Therapy (Signed)
[place] MAIN [facility] SERVICES [address] [place], [place], [address] Phone: [phone]   Fax:  [phone]  Physical Therapy Treatment  The patient has been informed of current processes in place at Outpatient Rehab to protect patients from Covid-19 exposure including social distancing, schedule modifications, and new cleaning procedures. After discussing their particular risk with a therapist based on the patient's personal risk factors, the patient has decided to proceed with in-person therapy.   Patient Details  Name: Dawn Fuentes MRN: [id number] Date of Birth: 06-29-1946 Referring Provider (PT): Steele Sizer   Encounter Date: 06/12/2019  PT End of Session - 06/12/19 1152    Visit Number  3    Number of Visits  10    Date for PT Re-Evaluation  07/31/19    Authorization Time Period  through 07/31/2019    Authorization - Visit Number  3    Authorization - Number of Visits  10    PT Start Time  0102    PT Stop Time  1133    PT Time Calculation (min)  60 min    Activity Tolerance  Patient tolerated treatment well;No increased pain    Behavior During Therapy  WFL for tasks assessed/performed       Past Medical History:  Diagnosis Date  . Allergy   . Anxiety    ER visit in Sept 2017 for anxiety  . Arthritis   . Chronic insomnia   . Colon adenoma   . Dyslipidemia   . Dysphagia   . Female stress incontinence   . GERD (gastroesophageal reflux disease)   . [facility], both eyes    [facility]  . Hyperlipidemia   . Hypertension    controlled on meds  . Hypothyroidism   . Menopause syndrome   . Osteoporosis   . Polyp of colon   . Renal insufficiency    mild- keeping an eye on  . Vitamin D deficiency     Past Surgical History:  Procedure Laterality Date  . ABDOMINAL HYSTERECTOMY     21 years ago  . APPENDECTOMY    . COLONOSCOPY WITH PROPOFOL N/A 05/29/2015   Procedure: COLONOSCOPY WITH PROPOFOL;   Surgeon: Hulen Luster, MD;  Location: [facility] ENDOSCOPY;  Service: Gastroenterology;  Laterality: N/A;  . ESOPHAGOGASTRODUODENOSCOPY N/A 05/29/2015   Procedure: ESOPHAGOGASTRODUODENOSCOPY (EGD);  Surgeon: Hulen Luster, MD;  Location: [facility] ENDOSCOPY;  Service: Gastroenterology;  Laterality: N/A;  . EYE SURGERY     eye lids  . OVARIAN CYST REMOVAL     needed transfusion  . PHOTOCOAGULATION WITH LASER Right 11/21/2016   Procedure: PHOTOCOAGULATION WITH LASER;  Surgeon: Ronnell Freshwater, MD;  Location: [place];  Service: Ophthalmology;  Laterality: Right;  A micropulse probe was applied to each hemilimbus with the following settings: 2062mW, 31.3% duty cycle, 90 seconds x 3 applications.     There were no vitals filed for this visit.   Pelvic Floor Physical Therapy Treatment Note  SCREENING  Changes in medications, allergies, or medical history?: no    SUBJECTIVE  Patient reports: She is doing the urge-suppression technique and it is working, is having a little less SUI but still noticed it even this morning. LBP is a little worse because she went to the mall yesterday.  Precautions:  See History  Pain update: Location of pain:R SIJ Current pain:0/10  Max pain:3/10 (with motion) Least pain:0/10 Nature of pain:deep ache  Patient Goals: To be able  to not have to wear Pads and not have urinary incontinence.   OBJECTIVE  Changes in: Posture/Observations:  R PSIS low, Pt. Not wearing recommended shoe-lift, forgot to bring in. Reports feeling like she is "walking on stilts" with it on.  Palpation: TTP through B adductors and medial hamstrings.  Gait Analysis: Vaulting over L LE (less than w/o heel-lift)  INTERVENTIONS THIS SESSION: Manual: Performed TP release and STM to B adductors to decrease spasm and pain and allow for improved balance of musculature for improved function and decreased symptoms. Therex: Reviewed and practiced hip-hinges and TA in  quadruped to improve strength of muscles opposing tight musculature to allow reciprocal inhibition to improve balance of musculature surrounding the pelvis and improve overall posture for optimal musculature length-tension relationship and function.  Dry-needle: performed the below described TPDN using standard approach and a .30x68mm needle to decrease spasm and pain and allow for improved balance of musculature for improved function and decreased symptoms.   Total time: 60 min.                     Trigger Point Dry Needling - 06/12/19 0001    Consent Given?  Yes    Education Handout Provided  No    Muscles Treated Lower Quadrant  Adductor longus/brevis/magnus;Hamstring    Dry Needling Comments  --   Bilateral   Adductor Response  Twitch response elicited;Palpable increased muscle length    Hamstring Response  Twitch response elicited;Palpable increased muscle length             PT Short Term Goals - 05/23/19 1205      PT SHORT TERM GOAL #1   Title  Patient will demonstrate improved pelvic alignment and balance of musculature surrounding the pelvis to facilitate decreased PFM spasms and improve PFM function for decreased SUI.    Baseline  spasms surrounding the pelvis and RLE long by 1.5 cm.    Time  5    Period  Weeks    Status  New    Target Date  06/27/19      PT SHORT TERM GOAL #2   Title  Patient will report consistent use of foot-stool (squatty-potty) for positioning with BM to decrease pain with BM and intra-abdominal pressure    Baseline  Pt. straining to have BM daily    Time  5    Period  Weeks    Status  New    Target Date  06/27/19      PT SHORT TERM GOAL #3   Title  Pt. will drink 64 oz. of water per day, decrease caffinated beverages to 1 per day and successfully implement urge suppression technique to decrease urge incontinence.    Baseline  Pt. only drinking 24 oz of water with other fluids being caffinated. not aware of urge suppression  technique.    Time  5    Period  Weeks    Status  New    Target Date  06/27/19        PT Long Term Goals - 05/23/19 1201      PT LONG TERM GOAL #1   Title  Patient will report no episodes of SUI or UUI over the course of the prior two weeks to demonstrate improved functional ability.    Baseline  Having SUI and UUI daily, using 2-3 #3 pads daily.    Time  10    Period  Weeks    Status  New  Target Date  08/01/19      PT LONG TERM GOAL #2   Title  Patient will score at or below 60/300 on the PFDI to demonstrate a clinically meaningful decrease in disability and distress due to pelvic floor dysfunction.    Baseline  PFDI: 105/300     Time  10    Period  Weeks    Status  New    Target Date  08/01/19      PT LONG TERM GOAL #3   Title  Patient will report having BM's at least every-other day with consistency between [facility] stool scale 3-5 over the prior week to demonstrate decreased constipation.    Baseline  Having to strain to have a BM, type 1 stool.    Time  10    Period  Weeks    Status  New    Target Date  08/01/19            Plan - 06/12/19 1152    Clinical Impression Statement  Pt. responded well to all interventions today, demonstrating decreased spasm and pain to pressure through B adductors as well as improved performance and understanding of HEP and breathing coordination. Continue per POC.    PT Next Visit Plan  assess alignment with shoe-lift and heel-lift combo, align pelvis and Perform TP release (discuss DN) to B hip-flexors andlow back musclulature, give stretches for lower crossed syndrome and hip abd strengthening.    PT Home Exercise Plan  3-way wall stretch, TA in quad, tall kneeling squats, urge suppression, heel-lift in L shoe (waiting for shoe lift too).    Consulted and Agree with Plan of Care  Patient       Patient will benefit from skilled therapeutic intervention in order to improve the following deficits and impairments:     Visit  Diagnosis: 1. Abnormal posture   2. Other muscle spasm   3. Muscle weakness (generalized)   4. Other lack of coordination        Problem List Patient Active Problem List   Diagnosis Date Noted  . [name] deficiency 12/26/2018  . Osteoarthritis of knees, bilateral 05/26/2017  . Primary open angle glaucoma of both eyes 11/21/2016  . Chronic kidney disease (CKD), stage III (moderate) ([place]) 04/06/2016  . Cystitis 10/12/2015  . Benign hypertension 08/21/2015  . Insomnia, persistent 08/21/2015  . Dyslipidemia 08/21/2015  . Gastro-esophageal reflux disease without esophagitis 08/21/2015  . Glaucoma 08/21/2015  . Blood glucose elevated 08/21/2015  . Adult hypothyroidism 08/21/2015  . Climacteric 08/21/2015  . Senile osteoporosis 08/21/2015  . Seasonal allergies 08/21/2015  . Female genuine stress incontinence 08/21/2015  . Vitamin D deficiency 08/21/2015   Willa Rough DPT, ATC Willa Rough 06/12/2019, 12:48 PM  [place] MAIN [facility] SERVICES [address] [place], [place], [address] Phone: [phone]   Fax:  [phone]  Name: Dawn Fuentes MRN: [id number] Date of Birth: Jul 02, 1946

## 2019-06-19 ENCOUNTER — Ambulatory Visit: Payer: PPO | Admitting: Podiatry

## 2019-06-19 ENCOUNTER — Ambulatory Visit: Payer: PPO | Attending: Family Medicine

## 2019-06-19 ENCOUNTER — Other Ambulatory Visit: Payer: Self-pay

## 2019-06-19 DIAGNOSIS — M6281 Muscle weakness (generalized): Secondary | ICD-10-CM | POA: Diagnosis present

## 2019-06-19 DIAGNOSIS — R293 Abnormal posture: Secondary | ICD-10-CM | POA: Diagnosis present

## 2019-06-19 DIAGNOSIS — R278 Other lack of coordination: Secondary | ICD-10-CM | POA: Insufficient documentation

## 2019-06-19 DIAGNOSIS — M62838 Other muscle spasm: Secondary | ICD-10-CM | POA: Insufficient documentation

## 2019-06-19 NOTE — Therapy (Signed)
[place] MAIN [facility] SERVICES [address] [place], [place], [address] Phone: [phone]   Fax:  [phone]  Physical Therapy Treatment  The patient has been informed of current processes in place at Outpatient Rehab to protect patients from Covid-19 exposure including social distancing, schedule modifications, and new cleaning procedures. After discussing their particular risk with a therapist based on the patient's personal risk factors, the patient has decided to proceed with in-person therapy.   Patient Details  Name: Dawn Fuentes MRN: [id number] Date of Birth: 07/10/1946 Referring Provider (PT): Steele Sizer   Encounter Date: 06/19/2019  PT End of Session - 06/20/19 0942    Visit Number  4    Number of Visits  10    Date for PT Re-Evaluation  07/31/19    Authorization Time Period  through 07/31/2019    Authorization - Visit Number  4    Authorization - Number of Visits  10    PT Start Time  8242    PT Stop Time  1130    PT Time Calculation (min)  60 min    Activity Tolerance  Patient tolerated treatment well;No increased pain    Behavior During Therapy  WFL for tasks assessed/performed       Past Medical History:  Diagnosis Date  . Allergy   . Anxiety    ER visit in Sept 2017 for anxiety  . Arthritis   . Chronic insomnia   . Colon adenoma   . Dyslipidemia   . Dysphagia   . Female stress incontinence   . GERD (gastroesophageal reflux disease)   . [facility], both eyes    [facility]  . Hyperlipidemia   . Hypertension    controlled on meds  . Hypothyroidism   . Menopause syndrome   . Osteoporosis   . Polyp of colon   . Renal insufficiency    mild- keeping an eye on  . Vitamin D deficiency     Past Surgical History:  Procedure Laterality Date  . ABDOMINAL HYSTERECTOMY     21 years ago  . APPENDECTOMY    . COLONOSCOPY WITH PROPOFOL N/A 05/29/2015   Procedure: COLONOSCOPY WITH PROPOFOL;   Surgeon: Hulen Luster, MD;  Location: [facility] ENDOSCOPY;  Service: Gastroenterology;  Laterality: N/A;  . ESOPHAGOGASTRODUODENOSCOPY N/A 05/29/2015   Procedure: ESOPHAGOGASTRODUODENOSCOPY (EGD);  Surgeon: Hulen Luster, MD;  Location: [facility] ENDOSCOPY;  Service: Gastroenterology;  Laterality: N/A;  . EYE SURGERY     eye lids  . OVARIAN CYST REMOVAL     needed transfusion  . PHOTOCOAGULATION WITH LASER Right 11/21/2016   Procedure: PHOTOCOAGULATION WITH LASER;  Surgeon: Ronnell Freshwater, MD;  Location: [place];  Service: Ophthalmology;  Laterality: Right;  A micropulse probe was applied to each hemilimbus with the following settings: 2048mW, 31.3% duty cycle, 90 seconds x 3 applications.     There were no vitals filed for this visit.    Pelvic Floor Physical Therapy Treatment Note  SCREENING  Changes in medications, allergies, or medical history?: none    SUBJECTIVE  Patient reports: Has been to the beach several times, is shopping for furniture, going out with her "friend". Not having any pain. Is having less leakage, none with urge in the morning, less amount of urine now when she does have SUI.  Precautions:  See History  Pain update: No pain since last visit  Patient Goals: To be able to not have to wear Pads  and not have urinary incontinence.   OBJECTIVE  Changes in: Posture/Observations:  PSIS level in standing with both shoe and heel-lift.   Range of Motion/Flexibilty:  Decreased mobility at L-S junction and surrounding sacrum. L>R  Strength/MMT:  Pt. Reports increased proprioception and control of the PFM following treatment.  Palpation: TTP to R lumbar paraspinals, Multifiduas, Glute min, Piriformis and OI  Gait Analysis: Much improved (decreased vaulting) with addition of shoe-lift.  INTERVENTIONS THIS SESSION: Manual: Performed grade 3-4 PA mobs to all sacral borders and L5-S1 to improve mobility of joint and surrounding connective tissue and  decrease pressure on nerve roots for improved conductivity and function of down-stream tissues. Performed TP release to R lumbar paraspinals, Multifiduas, Glute min, Piriformis and OI to decrease spasm and pain and allow for improved balance of musculature for improved function and decreased symptoms. Therex: Educated on and practiced pelvic tilts with addition of kegels to improve length and strength of the PFM for decreased SUI and hip EXT and ABD to allow for pelvic stability and improved PFM function. Self-care: Educated on pre-squeeze and sneeze technique as well as seated posture and lumbar support to allow for decreased tension and pain in the LB as well as decreased pressure on nerve roots innervating the PFM.  Total time: 60 min.                            PT Short Term Goals - 05/23/19 1205      PT SHORT TERM GOAL #1   Title  Patient will demonstrate improved pelvic alignment and balance of musculature surrounding the pelvis to facilitate decreased PFM spasms and improve PFM function for decreased SUI.    Baseline  spasms surrounding the pelvis and RLE long by 1.5 cm.    Time  5    Period  Weeks    Status  New    Target Date  06/27/19      PT SHORT TERM GOAL #2   Title  Patient will report consistent use of foot-stool (squatty-potty) for positioning with BM to decrease pain with BM and intra-abdominal pressure    Baseline  Pt. straining to have BM daily    Time  5    Period  Weeks    Status  New    Target Date  06/27/19      PT SHORT TERM GOAL #3   Title  Pt. will drink 64 oz. of water per day, decrease caffinated beverages to 1 per day and successfully implement urge suppression technique to decrease urge incontinence.    Baseline  Pt. only drinking 24 oz of water with other fluids being caffinated. not aware of urge suppression technique.    Time  5    Period  Weeks    Status  New    Target Date  06/27/19        PT Long Term Goals - 05/23/19  1201      PT LONG TERM GOAL #1   Title  Patient will report no episodes of SUI or UUI over the course of the prior two weeks to demonstrate improved functional ability.    Baseline  Having SUI and UUI daily, using 2-3 #3 pads daily.    Time  10    Period  Weeks    Status  New    Target Date  08/01/19      PT LONG TERM GOAL #2   Title  Patient  will score at or below 60/300 on the PFDI to demonstrate a clinically meaningful decrease in disability and distress due to pelvic floor dysfunction.    Baseline  PFDI: 105/300     Time  10    Period  Weeks    Status  New    Target Date  08/01/19      PT LONG TERM GOAL #3   Title  Patient will report having BM's at least every-other day with consistency between [facility] stool scale 3-5 over the prior week to demonstrate decreased constipation.    Baseline  Having to strain to have a BM, type 1 stool.    Time  10    Period  Weeks    Status  New    Target Date  08/01/19            Plan - 06/20/19 0942    Clinical Impression Statement  Pt. Responded well to all interventions today, demonstrating improved pelvic alignment, decreased pain with motion from 5/10 to 0/10 and improved spinal mobility as well as understanding and correct performance of all education and exercises provided today. They will continue to benefit from skilled physical therapy to work toward remaining goals and maximize function as well as decrease likelihood of symptom increase or recurrence.    PT Next Visit Plan  Perform TP release (discuss DN) to B hip-flexors give stretches for lower crossed syndrome and.    PT Home Exercise Plan  3-way wall stretch, TA in quad, tall kneeling squats, urge suppression, heel-lift in L shoe (waiting for shoe lift too).    Consulted and Agree with Plan of Care  Patient       Patient will benefit from skilled therapeutic intervention in order to improve the following deficits and impairments:     Visit Diagnosis: 1. Abnormal posture    2. Other muscle spasm   3. Muscle weakness (generalized)   4. Other lack of coordination        Problem List Patient Active Problem List   Diagnosis Date Noted  . [name] deficiency 12/26/2018  . Osteoarthritis of knees, bilateral 05/26/2017  . Primary open angle glaucoma of both eyes 11/21/2016  . Chronic kidney disease (CKD), stage III (moderate) ([place]) 04/06/2016  . Cystitis 10/12/2015  . Benign hypertension 08/21/2015  . Insomnia, persistent 08/21/2015  . Dyslipidemia 08/21/2015  . Gastro-esophageal reflux disease without esophagitis 08/21/2015  . Glaucoma 08/21/2015  . Blood glucose elevated 08/21/2015  . Adult hypothyroidism 08/21/2015  . Climacteric 08/21/2015  . Senile osteoporosis 08/21/2015  . Seasonal allergies 08/21/2015  . Female genuine stress incontinence 08/21/2015  . Vitamin D deficiency 08/21/2015   Willa Rough DPT, ATC Willa Rough 06/20/2019, 9:49 AM  [place] MAIN [facility] SERVICES [address] [place], [place], [address] Phone: [phone]   Fax:  [phone]  Name: Dawn Fuentes MRN: [id number] Date of Birth: 1946-05-07

## 2019-06-19 NOTE — Patient Instructions (Addendum)
Hip Abduction: Side Leg Lift - Side-Lying    Lie on side. Draw lower tummy muscle (TA) and pelvic floor in, Lift top leg until you feel strong contraction of muscle on the side of the hip. Keep top leg straight with body, toes pointing forward. Do not let your hip roll back! Do _10__ reps per set, __3_ sets per day  Hip Extension:    Lying face down, raise leg just off floor squeezing glutes. Keep leg straight. Hold 1 count. Lower leg to floor. Do _10__ reps per set, __3_ sets for each leg. Optional: Place small pillow under abdomen if back becomes uncomfortable.   "Pre-squeeze and sneeze"  Before you cough, sneeze, laugh etc. "pre-squeeze" the pelvic floor (kegel) and hold it until you finish coughing to retrain the muscles to hold the urine in during these activities.     Do 2 sets of 15 tilts per day. Breathe in when you tilt forward (A) and out when you tuck under (B). Feel the pelvic floor muscles relax as you breathe in (A), and squeeze as you breathe out.    When seated, you want to maintain pelvic neutral with the shoulders gently down and back and ears in line with your shoulders. A lumbar roll such as the one below or a home-made towel-roll can be used for this purpose. Even Olympic athletes can only maintain proper seated posture for about 10 minutes without support!    Pictured: The Original [employer] Lumbar Roll

## 2019-06-24 ENCOUNTER — Ambulatory Visit (INDEPENDENT_AMBULATORY_CARE_PROVIDER_SITE_OTHER): Payer: PPO

## 2019-06-24 ENCOUNTER — Other Ambulatory Visit: Payer: Self-pay

## 2019-06-24 DIAGNOSIS — E538 Deficiency of other specified B group vitamins: Secondary | ICD-10-CM | POA: Diagnosis not present

## 2019-06-24 NOTE — Progress Notes (Signed)
Patient came in for her B-12 injection. She tolerated it well. NKDA.   

## 2019-06-26 ENCOUNTER — Ambulatory Visit: Payer: PPO

## 2019-06-27 ENCOUNTER — Ambulatory Visit: Payer: PPO

## 2019-06-27 ENCOUNTER — Other Ambulatory Visit: Payer: Self-pay

## 2019-06-27 DIAGNOSIS — M62838 Other muscle spasm: Secondary | ICD-10-CM

## 2019-06-27 DIAGNOSIS — R293 Abnormal posture: Secondary | ICD-10-CM

## 2019-06-27 DIAGNOSIS — R278 Other lack of coordination: Secondary | ICD-10-CM

## 2019-06-27 DIAGNOSIS — M6281 Muscle weakness (generalized): Secondary | ICD-10-CM

## 2019-06-27 NOTE — Patient Instructions (Addendum)
Self External Trigger Point Relief    1) Wash your hands and prop yourself up in a way where you can easily reach the vagina. You may wish to have a small hand-held mirror near by.  2) Use the 2 middle fingers to put gentle pressure on the three external pelvic floor muscles and hold pressure and take deep breaths as you allow the tension to release and discomfort to dissipate   3) Repeat the process for any trigger points you find spending between 3-10 minutes on this every 1-2 days until you do not find any more trigger points or you are told otherwise by your therapist.  Self Posterior Fourchette Stretching/Mobilization    1) Wash your hands and prop your body up so you can easily reach the vagina, bring hand-held mirror if desired.  2) Apply lubricant to the thumb and vaginal opening  3) Place thumb ~ 1/2 an inch into the vagina with the pad of the thumb pointed down and apply gentle pressure to the posterior fourchette.  4) Gently sweep the thumb side to side and in/out while maintaining pressure down toward the anus. Make sure the pressure is not so great that your muscles tighten up and guard, just enough to create slight discomfort.  Do this for ~ 3 min. Per night to decrease tightness and tenderness at the vaginal opening.    Toileting Techniques for Bowel Movements (Defecation) Using your belly (abdomen) and pelvic floor muscles to have a bowel movement is usually instinctive.  Sometimes people can have problems with these muscles and have to relearn proper defecation (emptying) techniques.  If you have weakness in your muscles, organs that are falling out, decreased sensation in your pelvis, or ignore your urge to go, you may find yourself straining to have a bowel movement.  You are straining if you are: . holding your breath or taking in a huge gulp of air and holding it  . keeping your lips and jaw tensed and closed tightly . turning red in the face because of excessive  pushing or forcing . developing or worsening your  hemorrhoids . getting faint while pushing . not emptying completely and have to defecate many times a day  If you are straining, you are actually making it harder for yourself to have a bowel movement.  Many people find they are pulling up with the pelvic floor muscles and closing off instead of opening the anus. Due to lack pelvic floor relaxation and coordination the abdominal muscles, one has to work harder to push the feces out.  Many people have never been taught how to defecate efficiently and effectively.  Notice what happens to your body when you are having a bowel movement.  While you are sitting on the toilet pay attention to the following areas: . Jaw and mouth position . Angle of your hips   . Whether your feet touch the ground or not . Arm placement  . Spine position . Waist . Belly tension . Anus (opening of the anal canal)  An Evacuation/Defecation Plan   Here are the 4 basic points:  1. Lean forward enough for your elbows to rest on your knees 2. Support your feet on the floor or use a low stool if your feet don't touch the floor  3. Push out your belly as if you have swallowed a beach ball-you should feel a widening of your waist 4. Open and relax your pelvic floor muscles, rather than tightening around the anus  The following conditions my require modifications to your toileting posture:  . If you have had surgery in the past that limits your back, hip, pelvic, knee or ankle flexibility . Constipation   Your healthcare practitioner may make the following additional suggestions and adjustments:  1) Sit on the toilet  a) Make sure your feet are supported. b) Notice your hip angle and spine position-most people find it effective to lean forward or raise their knees, which can help the muscles around the anus to relax  c) When you lean forward, place your forearms on your thighs for support  2) Relax  suggestions a) Breath deeply in through your nose and out slowly through your mouth as if you are smelling the flowers and blowing out the candles. b) To become aware of how to relax your muscles, contracting and releasing muscles can be helpful.  Pull your pelvic floor muscles in tightly by using the image of holding back gas, or closing around the anus (visualize making a circle smaller) and lifting the anus up and in.  Then release the muscles and your anus should drop down and feel open. Repeat 5 times ending with the feeling of relaxation. c) Keep your pelvic floor muscles relaxed; let your belly bulge out. d) The digestive tract starts at the mouth and ends at the anal opening, so be sure to relax both ends of the tube.  Place your tongue on the roof of your mouth with your teeth separated.  This helps relax your mouth and will help to relax the anus at the same time.  3) Empty (defecation) a) Keep your pelvic floor and sphincter relaxed, then bulge your anal muscles.  Make the anal opening wide.  b) Stick your belly out as if you have swallowed a beach ball. c) Make your belly wall hard using your belly muscles while continuing to breathe. Doing this makes it easier to open your anus. d) Breath out and give a grunt (or try using other sounds such as ahhhh, shhhhh, ohhhh or grrrrrrr).  4) Finish a) As you finish your bowel movement, pull the pelvic floor muscles up and in.  This will leave your anus in the proper place rather than remaining pushed out and down. If you leave your anus pushed out and down, it will start to feel as though that is normal and give you incorrect signals about needing to have a bowel movement.     look for theracane or similar item to help you do self trigger-point release. Put pressure on the tender spot and hold while you take deep breaths until the pain eases.      Bring both knees up to your chest and then hold the one farthest from the edge of the  table/bed and let the other relax toward the floor until stretch is felt through the front of the hip.  First: gently start to take the weight of your leg away from gravity and hold for 5 seconds, then relax and let it lengthen, repeat 5 times.  Then: Hold for __5__ deep belly breaths. Relax. Repeat __2-3__ times per side.   Do this __1-2__ times per day.

## 2019-06-27 NOTE — Therapy (Signed)
[place] MAIN [facility] SERVICES [address] [place], [place], [address] Phone: [phone]   Fax:  [phone]  Physical Therapy Treatment  The patient has been informed of current processes in place at Outpatient Rehab to protect patients from Covid-19 exposure including social distancing, schedule modifications, and new cleaning procedures. After discussing their particular risk with a therapist based on the patient's personal risk factors, the patient has decided to proceed with in-person therapy.   Patient Details  Name: Dawn Fuentes MRN: [id number] Date of Birth: May 12, 1946 Referring Provider (PT): Steele Sizer   Encounter Date: 06/27/2019  PT End of Session - 06/27/19 1520    Visit Number  5    Number of Visits  10    Date for PT Re-Evaluation  07/31/19    Authorization Time Period  through 07/31/2019    Authorization - Visit Number  5    Authorization - Number of Visits  10    PT Start Time  5027    PT Stop Time  1510    PT Time Calculation (min)  68 min    Activity Tolerance  Patient tolerated treatment well;No increased pain    Behavior During Therapy  WFL for tasks assessed/performed       Past Medical History:  Diagnosis Date  . Allergy   . Anxiety    ER visit in Sept 2017 for anxiety  . Arthritis   . Chronic insomnia   . Colon adenoma   . Dyslipidemia   . Dysphagia   . Female stress incontinence   . GERD (gastroesophageal reflux disease)   . [facility], both [facility]  . Hyperlipidemia   . Hypertension    controlled on meds  . Hypothyroidism   . Menopause syndrome   . Osteoporosis   . Polyp of colon   . Renal insufficiency    mild- keeping an eye on  . Vitamin D deficiency     Past Surgical History:  Procedure Laterality Date  . ABDOMINAL HYSTERECTOMY     21 years ago  . APPENDECTOMY    . COLONOSCOPY WITH PROPOFOL N/A 05/29/2015   Procedure: COLONOSCOPY WITH PROPOFOL;   Surgeon: Hulen Luster, MD;  Location: [facility] ENDOSCOPY;  Service: Gastroenterology;  Laterality: N/A;  . ESOPHAGOGASTRODUODENOSCOPY N/A 05/29/2015   Procedure: ESOPHAGOGASTRODUODENOSCOPY (EGD);  Surgeon: Hulen Luster, MD;  Location: [facility] ENDOSCOPY;  Service: Gastroenterology;  Laterality: N/A;  . EYE SURGERY     eye lids  . OVARIAN CYST REMOVAL     needed transfusion  . PHOTOCOAGULATION WITH LASER Right 11/21/2016   Procedure: PHOTOCOAGULATION WITH LASER;  Surgeon: Ronnell Freshwater, MD;  Location: [facility];  Service: Ophthalmology;  Laterality: Right;  A micropulse probe was applied to each hemilimbus with the following settings: 2060mW, 31.3% duty cycle, 90 seconds x 3 applications.     There were no vitals filed for this visit.    Pelvic Floor Physical Therapy Treatment Note  SCREENING  Changes in medications, allergies, or medical history?:  none   SUBJECTIVE  Patient reports: Asked about intercourse. Is having leakage still. Not having Urge incontinence still,    Precautions:  See History  Pain update: Pt. Only describes pain in neck with head rotation.  Patient Goals: To be able to not have to wear Pads and not have urinary incontinence.   OBJECTIVE  Changes in: Posture/Observations:  Not wearing boot or shoe-lift has heel-lift in. Grossly  level, not assessed directly  Range of Motion/Flexibilty:  Pt. Turning head and shoulders together due to pain in R side of neck with AROM. Decreased 1st rib mobility on R.  *WNL following treatment.  Palpation: TTP to B Iliacus, Psoas, R Upper trap, anterior and medial Scalenes.  Gait Analysis: Decreased hip EXT B, R>L  INTERVENTIONS THIS SESSION: Self-care: Educated on how PFM spasms lead to pain with intercourse, SUI, and constipation and how to use self TP release to external PFM, posterior fourchette massage, and pelvic wand to decrease PFM spasms and improve symptoms, educated on squatty potty to allow for  decreased straining with BM, Educated on how to use theracane to decrease spasms in neck/shoulders and hands to do so in the inner thigh as well as why exercises are important for decreasing likelihood that spasms will return. Manual: Performed TP release to Iliacus, Psoas, R Upper trap, anterior and medial Scalenes to decrease spasm and pain and allow for improved balance of musculature for improved function and decreased symptoms. Therex: Educated on and practiced supine hip-flexor stretch To maintain and improve muscle length and allow for improved balance of musculature for long-term symptom relief.   Total time: 68 min.                            PT Short Term Goals - 05/23/19 1205      PT SHORT TERM GOAL #1   Title  Patient will demonstrate improved pelvic alignment and balance of musculature surrounding the pelvis to facilitate decreased PFM spasms and improve PFM function for decreased SUI.    Baseline  spasms surrounding the pelvis and RLE long by 1.5 cm.    Time  5    Period  Weeks    Status  New    Target Date  06/27/19      PT SHORT TERM GOAL #2   Title  Patient will report consistent use of foot-stool (squatty-potty) for positioning with BM to decrease pain with BM and intra-abdominal pressure    Baseline  Pt. straining to have BM daily    Time  5    Period  Weeks    Status  New    Target Date  06/27/19      PT SHORT TERM GOAL #3   Title  Pt. will drink 64 oz. of water per day, decrease caffinated beverages to 1 per day and successfully implement urge suppression technique to decrease urge incontinence.    Baseline  Pt. only drinking 24 oz of water with other fluids being caffinated. not aware of urge suppression technique.    Time  5    Period  Weeks    Status  New    Target Date  06/27/19        PT Long Term Goals - 05/23/19 1201      PT LONG TERM GOAL #1   Title  Patient will report no episodes of SUI or UUI over the course of the prior  two weeks to demonstrate improved functional ability.    Baseline  Having SUI and UUI daily, using 2-3 #3 pads daily.    Time  10    Period  Weeks    Status  New    Target Date  08/01/19      PT LONG TERM GOAL #2   Title  Patient will score at or below 60/300 on the PFDI to demonstrate a clinically meaningful decrease in  disability and distress due to pelvic floor dysfunction.    Baseline  PFDI: 105/300     Time  10    Period  Weeks    Status  New    Target Date  08/01/19      PT LONG TERM GOAL #3   Title  Patient will report having BM's at least every-other day with consistency between [facility] stool scale 3-5 over the prior week to demonstrate decreased constipation.    Baseline  Having to strain to have a BM, type 1 stool.    Time  10    Period  Weeks    Status  New    Target Date  08/01/19            Plan - 06/27/19 1520    Clinical Impression Statement  Pt. Pt. Responded well to all interventions today, demonstrating improved hip EXT ROM and neck rotation ROM and resolution of pain with neck motion as well as understanding and correct performance of all education and exercises provided today. They will continue to benefit from skilled physical therapy to work toward remaining goals and maximize function as well as decrease likelihood of symptom increase or recurrence.    PT Next Visit Plan  Give Glute strengthening, Gait mechanics for hip EXT/ decrease lordosis, review laying hip EXT/ABD ask about handout?  colonic massage/bowel retraining?    PT Home Exercise Plan  3-way wall stretch, TA in quad, tall kneeling squats, urge suppression, heel-lift in L shoe, self external TP release/posterior fourchette, vitamin E oil and coconut, hip-flexor stretch/hold release, squatty potty    Consulted and Agree with Plan of Care  Patient       Patient will benefit from skilled therapeutic intervention in order to improve the following deficits and impairments:     Visit Diagnosis: 1.  Abnormal posture   2. Other muscle spasm   3. Muscle weakness (generalized)   4. Other lack of coordination        Problem List Patient Active Problem List   Diagnosis Date Noted  . [name] deficiency 12/26/2018  . Osteoarthritis of knees, bilateral 05/26/2017  . Primary open angle glaucoma of both eyes 11/21/2016  . Chronic kidney disease (CKD), stage III (moderate) ([place]) 04/06/2016  . Cystitis 10/12/2015  . Benign hypertension 08/21/2015  . Insomnia, persistent 08/21/2015  . Dyslipidemia 08/21/2015  . Gastro-esophageal reflux disease without esophagitis 08/21/2015  . Glaucoma 08/21/2015  . Blood glucose elevated 08/21/2015  . Adult hypothyroidism 08/21/2015  . Climacteric 08/21/2015  . Senile osteoporosis 08/21/2015  . Seasonal allergies 08/21/2015  . Female genuine stress incontinence 08/21/2015  . Vitamin D deficiency 08/21/2015   Willa Rough DPT, ATC Willa Rough 06/27/2019, 3:26 PM  [place] MAIN [facility] SERVICES [address] [place], [place], [address] Phone: [phone]   Fax:  [phone]  Name: Dawn Fuentes MRN: [id number] Date of Birth: 1946-08-16

## 2019-07-03 ENCOUNTER — Ambulatory Visit (INDEPENDENT_AMBULATORY_CARE_PROVIDER_SITE_OTHER): Payer: PPO | Admitting: Podiatry

## 2019-07-03 ENCOUNTER — Encounter: Payer: Self-pay | Admitting: Podiatry

## 2019-07-03 ENCOUNTER — Ambulatory Visit: Payer: PPO

## 2019-07-03 ENCOUNTER — Other Ambulatory Visit: Payer: Self-pay

## 2019-07-03 ENCOUNTER — Ambulatory Visit (INDEPENDENT_AMBULATORY_CARE_PROVIDER_SITE_OTHER): Payer: PPO

## 2019-07-03 VITALS — Temp 97.6°F

## 2019-07-03 DIAGNOSIS — S92344D Nondisplaced fracture of fourth metatarsal bone, right foot, subsequent encounter for fracture with routine healing: Secondary | ICD-10-CM

## 2019-07-03 NOTE — Progress Notes (Signed)
She presents today for follow-up of her fractured metatarsal fourth right.  She states that it feels much better.  Objective: Signs are stable alert oriented x3.  Pulses are palpable.  There is no erythema edema cellulitis drainage odor much decrease in pain on palpation.  Radiographs do demonstrate a tilt of the head of the fourth metatarsal and bone callus forming.  Assessment: Mildly displaced fracture fourth metatarsal right foot.  Plan: Continue use of stabilization Darco shoe dispensed today follow-up with me in 1 month

## 2019-07-04 ENCOUNTER — Ambulatory Visit: Payer: PPO

## 2019-07-04 DIAGNOSIS — R278 Other lack of coordination: Secondary | ICD-10-CM

## 2019-07-04 DIAGNOSIS — M6281 Muscle weakness (generalized): Secondary | ICD-10-CM

## 2019-07-04 DIAGNOSIS — R293 Abnormal posture: Secondary | ICD-10-CM

## 2019-07-04 DIAGNOSIS — M62838 Other muscle spasm: Secondary | ICD-10-CM

## 2019-07-04 NOTE — Therapy (Signed)
[place] MAIN [facility] SERVICES [address] [place], [place], [address] Phone: [phone]   Fax:  [phone]  Physical Therapy Treatment  The patient has been informed of current processes in place at Outpatient Rehab to protect patients from Covid-19 exposure including social distancing, schedule modifications, and new cleaning procedures. After discussing their particular risk with a therapist based on the patient's personal risk factors, the patient has decided to proceed with in-person therapy.   Patient Details  Name: Dawn Fuentes MRN: [id number] Date of Birth: [birth year] Referring Provider (PT): Steele Sizer   Encounter Date: 07/04/2019  PT End of Session - 07/04/19 1932    Visit Number  6    Number of Visits  10    Date for PT Re-Evaluation  07/31/19    Authorization Time Period  through 07/31/2019    Authorization - Visit Number  6    Authorization - Number of Visits  10    PT Start Time  6962    PT Stop Time  1630    PT Time Calculation (min)  60 min    Activity Tolerance  Patient tolerated treatment well;No increased pain    Behavior During Therapy  WFL for tasks assessed/performed       Past Medical History:  Diagnosis Date  . Allergy   . Anxiety    ER visit in Sept 2017 for anxiety  . Arthritis   . Chronic insomnia   . Colon adenoma   . Dyslipidemia   . Dysphagia   . Female stress incontinence   . GERD (gastroesophageal reflux disease)   . [facility] both [facility]  . Hyperlipidemia   . Hypertension    controlled on meds  . Hypothyroidism   . Menopause syndrome   . Osteoporosis   . Polyp of colon   . Renal insufficiency    mild- keeping an eye on  . Vitamin D deficiency     Past Surgical History:  Procedure Laterality Date  . ABDOMINAL HYSTERECTOMY     21 years ago  . APPENDECTOMY    . COLONOSCOPY WITH PROPOFOL N/A 05/29/2015   Procedure: COLONOSCOPY WITH PROPOFOL;   Surgeon: Hulen Luster, MD;  Location: [facility] ENDOSCOPY;  Service: Gastroenterology;  Laterality: N/A;  . ESOPHAGOGASTRODUODENOSCOPY N/A 05/29/2015   Procedure: ESOPHAGOGASTRODUODENOSCOPY (EGD);  Surgeon: Hulen Luster, MD;  Location: [facility] ENDOSCOPY;  Service: Gastroenterology;  Laterality: N/A;  . EYE SURGERY     eye lids  . OVARIAN CYST REMOVAL     needed transfusion  . PHOTOCOAGULATION WITH LASER Right 11/21/2016   Procedure: PHOTOCOAGULATION WITH LASER;  Surgeon: Ronnell Freshwater, MD;  Location: [place];  Service: Ophthalmology;  Laterality: Right;  A micropulse probe was applied to each hemilimbus with the following settings: 2058mW, 31.3% duty cycle, 90 seconds x 3 applications.     There were no vitals filed for this visit.    Pelvic Floor Physical Therapy Treatment Note  SCREENING  Changes in medications, allergies, or medical history?: she has been promoted to a post-op shoe from the boot.    SUBJECTIVE  Patient reports: She is doing well, will be at the beach next week for her anniversary. Is having leakage mainly when she gets up in the morning.   Precautions:  See History  Pain update: No pain  Patient Goals: To be able to not have to wear Pads and not have urinary incontinence.  OBJECTIVE  Changes in: Posture/Observations:  Pt. Is not aligned today due to switch from boot to post-op shoe which is shorter but still taller than her other shoe with heel-lift in place.  Gait Analysis: Decreased hip EXT on R>L, decreased trunk rotation and toe-off on R>L  INTERVENTIONS THIS SESSION: Self-care: Educated on the importance of and how to improve on drinking enough water to decrease constipation and prevent UUI by decreasing concentration of urine and therefore the irritation of the bladder lining. Reviewed how to perform urge suppression technique to decrease UUI in the morning. NM re-ed: Educated on and practiced gait mechanics to decrease likelihood of  spasms returning from poor gait mechanics learned in boot and with leg-length discrepancy.  Therex: reviewed hip EXT and ABD as well as TA in quad to improve strength of muscles opposing tight musculature to allow reciprocal inhibition to improve balance of musculature surrounding the pelvis and improve overall posture for optimal musculature length-tension relationship and function.   Total time: 60 min.                            PT Short Term Goals - 07/04/19 1541      PT SHORT TERM GOAL #1   Title  Patient will demonstrate improved pelvic alignment and balance of musculature surrounding the pelvis to facilitate decreased PFM spasms and improve PFM function for decreased SUI.    Baseline  spasms surrounding the pelvis and RLE long by 1.5 cm.    Time  5    Period  Weeks    Status  Achieved    Target Date  06/27/19      PT SHORT TERM GOAL #2   Title  Patient will report consistent use of foot-stool (squatty-potty) for positioning with BM to decrease pain with BM and intra-abdominal pressure    Baseline  Pt. straining to have BM daily    Time  5    Period  Weeks    Status  Achieved    Target Date  06/27/19      PT SHORT TERM GOAL #3   Title  Pt. will drink 64 oz. of water per day, decrease caffinated beverages to 1 per day and successfully implement urge suppression technique to decrease urge incontinence.    Baseline  Pt. only drinking 24 oz of water with other fluids being caffinated. not aware of urge suppression technique.    Time  5    Period  Weeks    Status  New    Target Date  06/27/19        PT Long Term Goals - 05/23/19 1201      PT LONG TERM GOAL #1   Title  Patient will report no episodes of SUI or UUI over the course of the prior two weeks to demonstrate improved functional ability.    Baseline  Having SUI and UUI daily, using 2-3 #3 pads daily.    Time  10    Period  Weeks    Status  New    Target Date  08/01/19      PT LONG TERM  GOAL #2   Title  Patient will score at or below 60/300 on the PFDI to demonstrate a clinically meaningful decrease in disability and distress due to pelvic floor dysfunction.    Baseline  PFDI: 105/300     Time  10    Period  Weeks    Status  New    Target Date  08/01/19      PT LONG TERM GOAL #3   Title  Patient will report having BM's at least every-other day with consistency between [facility] stool scale 3-5 over the prior week to demonstrate decreased constipation.    Baseline  Having to strain to have a BM, type 1 stool.    Time  10    Period  Weeks    Status  New    Target Date  08/01/19            Plan - 07/04/19 1933    Clinical Impression Statement  Pt. Responded well to all interventions today, demonstrating improved gait mechanics and performance of HEP as well as understanding and correct performance of all education and exercises provided today. They will continue to benefit from skilled physical therapy to work toward remaining goals and maximize function as well as decrease likelihood of symptom increase or recurrence.     PT Next Visit Plan  Give Glute strengthening, up-grade TA from quad TA?  colonic massage/bowel retraining?    PT Home Exercise Plan  3-way wall stretch, TA in quad, tall kneeling squats, urge suppression, heel-lift in L shoe, self external TP release/posterior fourchette, vitamin E oil and coconut, hip-flexor stretch/hold release, squatty potty    Consulted and Agree with Plan of Care  Patient       Patient will benefit from skilled therapeutic intervention in order to improve the following deficits and impairments:     Visit Diagnosis: 1. Abnormal posture   2. Other muscle spasm   3. Muscle weakness (generalized)   4. Other lack of coordination        Problem List Patient Active Problem List   Diagnosis Date Noted  . [name] deficiency 12/26/2018  . Osteoarthritis of knees, bilateral 05/26/2017  . Primary open angle glaucoma of both eyes  11/21/2016  . Chronic kidney disease (CKD), stage III (moderate) ([place]) 04/06/2016  . Cystitis 10/12/2015  . Benign hypertension 08/21/2015  . Insomnia, persistent 08/21/2015  . Dyslipidemia 08/21/2015  . Gastro-esophageal reflux disease without esophagitis 08/21/2015  . Glaucoma 08/21/2015  . Blood glucose elevated 08/21/2015  . Adult hypothyroidism 08/21/2015  . Climacteric 08/21/2015  . Senile osteoporosis 08/21/2015  . Seasonal allergies 08/21/2015  . Female genuine stress incontinence 08/21/2015  . Vitamin D deficiency 08/21/2015   Willa Rough DPT, ATC Willa Rough 07/04/2019, 7:45 PM  [place] MAIN [facility] SERVICES [address] [place], [place], [address] Phone: [phone]   Fax:  [phone]  Name: SUZANNA ZAHN MRN: [id number] Date of Birth: [birth year]

## 2019-07-04 NOTE — Patient Instructions (Addendum)
Urge supression technique:  1) Take a deep breath to convince yourself that you are in control and calm the nervous system.  2) Do 5 "quick-flick" kegels (pelvic floor muscle contractions) and re-assess the urge. Repeat another set if urge is still present. 3) Once the urge has decreased, start walking calmly to the the bathroom. Stop and repeat steps 1 and 2 as many times as needed until you can successfully get to the toilet. 4) Only once seated, take a deep breath and allow the pelvic floor muscles to relax and allow for the urine to flow.    Do not be discouraged if you are not successful the first couple times, this is normal and it will take practice but remember that YOU are in control. Start by practicing this at home where you do not have to worry as much if there were to be an accident. Allowing yourself to get rushed or nervous puts the bladder back in control and will not allow the technique to work.   Available in 32 oz, 1/2 gallon, or 1 gallon sizes, all colors...... To help you increase your water intake, try getting a reusable water bottle with markings to help you keep up with your hydration throughout the day. You should be drinking ~ 50% of your bodyweight in Oz. Of water each day or an average of ~ 64 Oz.  You can also add fruit or cucumbers to your water to make it more interesting and desirable. Cucumbers and honeydew are good choices because they are not acidic like citrus fruit and they add a refreshing flavor.   Hip Abduction: Side Leg Lift - Side-Lying    Lie on side. Draw lower tummy muscle (TA) and pelvic floor in, Lift top leg until you feel strong contraction of muscle on the side of the hip. Keep top leg straight with body, toes pointing forward. Do not let your hip roll back! Do _10__ reps per set, __3_ sets per day  Hip Extension:    Lying face down, raise leg just off floor squeezing glutes. Keep leg straight. Hold 1 count. Lower leg to floor. Do _10__  reps per set, __3_ sets for each leg. Optional: Place small pillow under abdomen if back becomes uncomfortable.       Make sure when you walk that you let your hip extend and feel that you are pushing off of your toe. Feel a little activation of your low tummy muscles while you are walking "draw up tall like you are being pulled by a string at the top of your head. Let arms swing naturally.

## 2019-07-10 ENCOUNTER — Ambulatory Visit: Payer: PPO

## 2019-07-17 ENCOUNTER — Ambulatory Visit: Payer: PPO

## 2019-07-18 ENCOUNTER — Ambulatory Visit: Payer: PPO

## 2019-07-23 ENCOUNTER — Other Ambulatory Visit: Payer: Self-pay

## 2019-07-23 ENCOUNTER — Ambulatory Visit: Payer: PPO | Attending: Family Medicine

## 2019-07-23 DIAGNOSIS — R278 Other lack of coordination: Secondary | ICD-10-CM | POA: Diagnosis present

## 2019-07-23 DIAGNOSIS — M6281 Muscle weakness (generalized): Secondary | ICD-10-CM | POA: Insufficient documentation

## 2019-07-23 DIAGNOSIS — R293 Abnormal posture: Secondary | ICD-10-CM | POA: Insufficient documentation

## 2019-07-23 DIAGNOSIS — M62838 Other muscle spasm: Secondary | ICD-10-CM | POA: Diagnosis present

## 2019-07-23 NOTE — Patient Instructions (Signed)
   Do 2 sets of 15 tilts per day. Breathe in when you tilt forward (A) and out when you tuck under (B).  Look for a heel-lift that is 1.5cm thick.

## 2019-07-23 NOTE — Therapy (Signed)
[place] MAIN [facility] SERVICES [address] [place], [place], [address] Phone: [phone]   Fax:  [phone]  Physical Therapy Treatment  The patient has been informed of current processes in place at Outpatient Rehab to protect patients from Covid-19 exposure including social distancing, schedule modifications, and new cleaning procedures. After discussing their particular risk with a therapist based on the patient's personal risk factors, the patient has decided to proceed with in-person therapy.   Patient Details  Name: Dawn Fuentes MRN: [id number] Date of Birth: 22-May-1946 Referring Provider (PT): Steele Sizer   Encounter Date: 07/23/2019  PT End of Session - 07/23/19 1103    Visit Number  7    Number of Visits  10    Date for PT Re-Evaluation  07/31/19    Authorization Time Period  through 07/31/2019    Authorization - Visit Number  7    Authorization - Number of Visits  10    Activity Tolerance  Patient tolerated treatment well;No increased pain    Behavior During Therapy  WFL for tasks assessed/performed       Past Medical History:  Diagnosis Date  . Allergy   . Anxiety    ER visit in Sept 2017 for anxiety  . Arthritis   . Chronic insomnia   . Colon adenoma   . Dyslipidemia   . Dysphagia   . Female stress incontinence   . GERD (gastroesophageal reflux disease)   . [facility] both [facility]  . Hyperlipidemia   . Hypertension    controlled on meds  . Hypothyroidism   . Menopause syndrome   . Osteoporosis   . Polyp of colon   . Renal insufficiency    mild- keeping an eye on  . Vitamin D deficiency     Past Surgical History:  Procedure Laterality Date  . ABDOMINAL HYSTERECTOMY     21 years ago  . APPENDECTOMY    . COLONOSCOPY WITH PROPOFOL N/A 05/29/2015   Procedure: COLONOSCOPY WITH PROPOFOL;  Surgeon: Hulen Luster, MD;  Location: [facility] ENDOSCOPY;  Service: Gastroenterology;   Laterality: N/A;  . ESOPHAGOGASTRODUODENOSCOPY N/A 05/29/2015   Procedure: ESOPHAGOGASTRODUODENOSCOPY (EGD);  Surgeon: Hulen Luster, MD;  Location: [facility] ENDOSCOPY;  Service: Gastroenterology;  Laterality: N/A;  . EYE SURGERY     eye lids  . OVARIAN CYST REMOVAL     needed transfusion  . PHOTOCOAGULATION WITH LASER Right 11/21/2016   Procedure: PHOTOCOAGULATION WITH LASER;  Surgeon: Ronnell Freshwater, MD;  Location: [place];  Service: Ophthalmology;  Laterality: Right;  A micropulse probe was applied to each hemilimbus with the following settings: 2075mW, 31.3% duty cycle, 90 seconds x 3 applications.     There were no vitals filed for this visit.    Pelvic Floor Physical Therapy Treatment Note  SCREENING  Changes in medications, allergies, or medical history?: none  SUBJECTIVE  Patient reports: Is doing ok, has still had a "crick" in her neck on the R and on the LLB she has been having pain first thing in the morning that gets better as the day goes on. Is doing better with leakage. Had some with a sneeze the other day but is not having to run to the bathroom in the morning anymore.  Precautions:  See History  Pain update: 8/10 first thing in the morning, eases with movement. 5/10 in the neck.  **Neck pain 0/10 following treatment   Patient Goals:  To be able to not have to wear Pads and not have urinary incontinence.   OBJECTIVE  Changes in: Posture/Observations:  RLE-89, LLE: 87.5 R ilium high in standing R C2 and C6 spinous processes deviated to the R.  Heel-lift is correcting for ~ 1cm leg-length but Pt. Presents with 1.5cm leg-length difference so R ilium remains slightly high in standing.  ROM/Mobility: Decreased cervical mobility at C2 and C6.  Gait Analysis: Pt. Taking slightly longer steps with RLE now but not as long with LLE.  Palpation: TTP to R scalenes, sub-occipitals, SCM, and upper traps  INTERVENTIONS THIS SESSION: Manual:  Performed TP release to R scalenes, sub-occipitals, SCM, and upper traps as well as grade 3-4 R to L lateral mobs to decrease mal-alignment and pressure on nerve roots to decrease pain and allow for decreased sensitization of down-stream nervous tissue and muscles. Self-care: reviewed the importance of drinking enough water to decrease constipation and prevent hemorrhoids and fissures. Educated on under-correction of leg-length difference with heel-lift and need to find a taller heel-lift to allow for pelvic alignment and decreased pain/shearing force at SIJ.  Total time: 60 min.                                  PT Short Term Goals - 07/24/19 1610      PT SHORT TERM GOAL #1   Title  Patient will demonstrate improved pelvic alignment and balance of musculature surrounding the pelvis to facilitate decreased PFM spasms and improve PFM function for decreased SUI.    Baseline  spasms surrounding the pelvis and RLE long by 1.5 cm.    Time  5    Period  Weeks    Status  Achieved    Target Date  06/27/19      PT SHORT TERM GOAL #2   Title  Patient will report consistent use of foot-stool (squatty-potty) for positioning with BM to decrease pain with BM and intra-abdominal pressure    Baseline  Pt. straining to have BM daily    Time  5    Period  Weeks    Status  Achieved    Target Date  06/27/19      PT SHORT TERM GOAL #3   Title  Pt. will drink 64 oz. of water per day, decrease caffinated beverages to 1 per day and successfully implement urge suppression technique to decrease urge incontinence.    Baseline  Pt. only drinking 24 oz of water with other fluids being caffinated. not aware of urge suppression technique. As of 8/4: Pt. is able to use Urge suppresion tchnique effectively but only drinking 24-32 oz. of H2O per day.    Time  5    Period  Weeks    Status  On-going    Target Date  06/27/19        PT Long Term Goals - 07/23/19 1117      PT LONG TERM  GOAL #1   Title  Patient will report no episodes of SUI or UUI over the course of the prior two weeks to demonstrate improved functional ability.    Baseline  Having SUI and UUI daily, using 2-3 #3 pads daily. Still using 2-3 pads per day but mostly for hygeine not because they are wet.    Time  10    Period  Weeks    Status  On-going    Target Date  10/01/19  PT LONG TERM GOAL #2   Title  Patient will score at or below 60/300 on the PFDI to demonstrate a clinically meaningful decrease in disability and distress due to pelvic floor dysfunction.    Baseline  PFDI: 105/300     Time  10    Period  Weeks    Status  On-going    Target Date  10/01/19      PT LONG TERM GOAL #3   Title  Patient will report having BM's at least every-other day with consistency between [facility] stool scale 3-5 over the prior week to demonstrate decreased constipation.    Baseline  Having to strain to have a BM, type 1 stool.    Time  10    Period  Weeks    Status  On-going    Target Date  10/01/19            Plan - 07/24/19 0839    Clinical Impression Statement  Pt. has been responding well to physical therapy but has not progressed as fast as optimal due to having to wear a post-op boot and shoe for unrelated injury which created greater leg-length difference and interruptions to POC due to Pt. travel plans. She has decreased pain and incontinence from base-line but continues to have mild SUI with cough/sneeze and decreased core stability. Now that her foot has healed, we can focus more on gait mechanics and deep-core strengthening to continue to decrease Sx. and stabilize the pelvis to provide long-term relief.    PT Frequency  1x / week    PT Duration  Other (comment)   10 weeks   PT Next Visit Plan  Give Glute strengthening, up-grade TA from quad TA?  colonic massage/bowel retraining?    PT Home Exercise Plan  3-way wall stretch, TA in quad, tall kneeling squats, urge suppression, heel-lift in L  shoe, self external TP release/posterior fourchette, vitamin E oil and coconut, hip-flexor stretch/hold release, squatty potty    Consulted and Agree with Plan of Care  Patient       Patient will benefit from skilled therapeutic intervention in order to improve the following deficits and impairments:     Visit Diagnosis: 1. Abnormal posture   2. Other muscle spasm   3. Muscle weakness (generalized)   4. Other lack of coordination        Problem List Patient Active Problem List   Diagnosis Date Noted  . [name] deficiency 12/26/2018  . Osteoarthritis of knees, bilateral 05/26/2017  . Primary open angle glaucoma of both eyes 11/21/2016  . Chronic kidney disease (CKD), stage III (moderate) ([place]) 04/06/2016  . Cystitis 10/12/2015  . Benign hypertension 08/21/2015  . Insomnia, persistent 08/21/2015  . Dyslipidemia 08/21/2015  . Gastro-esophageal reflux disease without esophagitis 08/21/2015  . Glaucoma 08/21/2015  . Blood glucose elevated 08/21/2015  . Adult hypothyroidism 08/21/2015  . Climacteric 08/21/2015  . Senile osteoporosis 08/21/2015  . Seasonal allergies 08/21/2015  . Female genuine stress incontinence 08/21/2015  . Vitamin D deficiency 08/21/2015   Willa Rough DPT, ATC Willa Rough 07/24/2019, 8:51 AM  [place] MAIN [facility] SERVICES [address] [place], [place], [address] Phone: [phone]   Fax:  [phone]  Name: Dawn Fuentes MRN: [id number] Date of Birth: 1946/04/17

## 2019-07-24 ENCOUNTER — Other Ambulatory Visit: Payer: Self-pay | Admitting: Family Medicine

## 2019-07-24 ENCOUNTER — Ambulatory Visit: Payer: PPO

## 2019-07-24 DIAGNOSIS — K219 Gastro-esophageal reflux disease without esophagitis: Secondary | ICD-10-CM

## 2019-07-31 ENCOUNTER — Ambulatory Visit: Payer: PPO | Admitting: Podiatry

## 2019-08-05 ENCOUNTER — Ambulatory Visit: Payer: PPO

## 2019-08-05 ENCOUNTER — Other Ambulatory Visit: Payer: Self-pay

## 2019-08-05 DIAGNOSIS — R293 Abnormal posture: Secondary | ICD-10-CM | POA: Diagnosis not present

## 2019-08-05 DIAGNOSIS — M62838 Other muscle spasm: Secondary | ICD-10-CM

## 2019-08-05 DIAGNOSIS — R278 Other lack of coordination: Secondary | ICD-10-CM

## 2019-08-05 DIAGNOSIS — M6281 Muscle weakness (generalized): Secondary | ICD-10-CM

## 2019-08-05 NOTE — Patient Instructions (Addendum)
As you start wearing your heel-lift only wear it for an hour the first day and increase by an hour each day so you can allow for the body to adapt to the change easily without much pain. If your pain increases by more than 1-2 points, back off slightly or slow down how quickly you increase your wear time. Once you reach a full day of wear, use it as much as possible forever, even in house-shoes or flip-flops if necessary to keep yourself from reverting to bad pelvic and spinal alignment and having symptoms return.    Adjust-a-lift heel lift Can be found at [email or web address]  Hip Extension:    Lying face down, raise leg just off floor squeezing glutes. Keep leg straight. Hold 1 count. Lower leg to floor. Do _10__ reps per set, __3_ sets for each leg. Optional: Place small pillow under abdomen if back becomes uncomfortable.    Start on all fours, tuck your hips under slightly and come forward until you are in a straight line. Make sure that your knees, your hips, and your shoulders are in a straight line. Hold for ~ 15 seconds, (or until you feel like you are too tired to hold it correctly). Repeat 2-3 times (or up to ~ 1:30 sec. Total).   As you get stronger, you can lengthen each hold. When you can hold for ~ 1:30 straight, try switching to on your toes instead of knees. You will need to decrease your hold time initially and build back up to the full 1:30.    Exhale just before you cough and tuck under (figure B) hold this while also doing a kegel the whole time you are coughing/sneezing. Also do this as an exercise 10 times, once a day. For the first few, just cough once, then the 4th-6th time do 3 coughs, the last 4 times do 5 coughs.

## 2019-08-05 NOTE — Therapy (Signed)
[place] MAIN [facility] SERVICES [address] [place], [place], [address] Phone: [phone]   Fax:  [phone]  Physical Therapy Treatment  The patient has been informed of current processes in place at Outpatient Rehab to protect patients from Covid-19 exposure including social distancing, schedule modifications, and new cleaning procedures. After discussing their particular risk with a therapist based on the patient's personal risk factors, the patient has decided to proceed with in-person therapy.   Patient Details  Name: Dawn Fuentes MRN: [id number] Date of Birth: 21-Dec-1945 Referring Provider (PT): Steele Sizer   Encounter Date: 08/05/2019  PT End of Session - 08/05/19 1457    Visit Number  8    Number of Visits  17    Date for PT Re-Evaluation  07/31/19    Authorization Time Period  10/02/2019    Authorization - Visit Number  1    Authorization - Number of Visits  10    PT Start Time  6283    PT Stop Time  6629    PT Time Calculation (min)  58 min    Activity Tolerance  Patient tolerated treatment well;No increased pain    Behavior During Therapy  WFL for tasks assessed/performed       Past Medical History:  Diagnosis Date  . Allergy   . Anxiety    ER visit in Sept 2017 for anxiety  . Arthritis   . Chronic insomnia   . Colon adenoma   . Dyslipidemia   . Dysphagia   . Female stress incontinence   . GERD (gastroesophageal reflux disease)   . [facility], both eyes    [facility]  . Hyperlipidemia   . Hypertension    controlled on meds  . Hypothyroidism   . Menopause syndrome   . Osteoporosis   . Polyp of colon   . Renal insufficiency    mild- keeping an eye on  . Vitamin D deficiency     Past Surgical History:  Procedure Laterality Date  . ABDOMINAL HYSTERECTOMY     21 years ago  . APPENDECTOMY    . COLONOSCOPY WITH PROPOFOL N/A 05/29/2015   Procedure: COLONOSCOPY WITH PROPOFOL;   Surgeon: Hulen Luster, MD;  Location: [facility] ENDOSCOPY;  Service: Gastroenterology;  Laterality: N/A;  . ESOPHAGOGASTRODUODENOSCOPY N/A 05/29/2015   Procedure: ESOPHAGOGASTRODUODENOSCOPY (EGD);  Surgeon: Hulen Luster, MD;  Location: [facility] ENDOSCOPY;  Service: Gastroenterology;  Laterality: N/A;  . EYE SURGERY     eye lids  . OVARIAN CYST REMOVAL     needed transfusion  . PHOTOCOAGULATION WITH LASER Right 11/21/2016   Procedure: PHOTOCOAGULATION WITH LASER;  Surgeon: Ronnell Freshwater, MD;  Location: [place];  Service: Ophthalmology;  Laterality: Right;  A micropulse probe was applied to each hemilimbus with the following settings: 2049mW, 31.3% duty cycle, 90 seconds x 3 applications.     There were no vitals filed for this visit.    Pelvic Floor Physical Therapy Treatment Note  SCREENING  Changes in medications, allergies, or medical history?: none  SUBJECTIVE  Patient reports: Her back hurts when she bends/moves a certain way,feels good when sitting still. Had one day where she was coughing a lot and she had a big accident. Her neck pain is "off and on" it hurts with L rotation on the R side today. Has been doing better about water intake and is not having to strain as much for  BM.  Precautions:  See History  Pain update: 4/10 with side-bending, 0/10 when sitting still.  Patient Goals: To be able to not have to wear Pads and not have urinary incontinence.   OBJECTIVE  Changes in: Posture/Observations:  Pt. Wearing only felt lift, not rubber therefore uneven at pelvis.  Gait Analysis: Other than leg-length inequality from lacking heel-lift height, not bad. Some R trendelenburg.  Palpation: Pt. Has a difficult time recruiting glutes rather than back extensors, able to with cueing.  INTERVENTIONS THIS SESSION: Therex: Educated, practiced, and put in HEP prone hip EXT and knee planks to improve strength of muscles opposing tight musculature to allow reciprocal  inhibition to improve balance of musculature surrounding the pelvis and improve overall posture for optimal musculature length-tension relationship and function. NM re-ed: Educated on and Practiced with cueing tall kneeling hip-hinges and tall kneeling step-through with weight shift to improve timing and recruitment of deep-core for stability and to allow functional movement without shearing at SIJ.  Total time: 60 min.                            PT Short Term Goals - 07/24/19 0960      PT SHORT TERM GOAL #1   Title  Patient will demonstrate improved pelvic alignment and balance of musculature surrounding the pelvis to facilitate decreased PFM spasms and improve PFM function for decreased SUI.    Baseline  spasms surrounding the pelvis and RLE long by 1.5 cm.    Time  5    Period  Weeks    Status  Achieved    Target Date  06/27/19      PT SHORT TERM GOAL #2   Title  Patient will report consistent use of foot-stool (squatty-potty) for positioning with BM to decrease pain with BM and intra-abdominal pressure    Baseline  Pt. straining to have BM daily    Time  5    Period  Weeks    Status  Achieved    Target Date  06/27/19      PT SHORT TERM GOAL #3   Title  Pt. will drink 64 oz. of water per day, decrease caffinated beverages to 1 per day and successfully implement urge suppression technique to decrease urge incontinence.    Baseline  Pt. only drinking 24 oz of water with other fluids being caffinated. not aware of urge suppression technique. As of 8/4: Pt. is able to use Urge suppresion tchnique effectively but only drinking 24-32 oz. of H2O per day.    Time  5    Period  Weeks    Status  On-going    Target Date  06/27/19        PT Long Term Goals - 07/23/19 1117      PT LONG TERM GOAL #1   Title  Patient will report no episodes of SUI or UUI over the course of the prior two weeks to demonstrate improved functional ability.    Baseline  Having SUI and  UUI daily, using 2-3 #3 pads daily. Still using 2-3 pads per day but mostly for hygeine not because they are wet.    Time  10    Period  Weeks    Status  On-going    Target Date  10/01/19      PT LONG TERM GOAL #2   Title  Patient will score at or below 60/300 on the PFDI to demonstrate a clinically meaningful decrease in disability and distress  due to pelvic floor dysfunction.    Baseline  PFDI: 105/300     Time  10    Period  Weeks    Status  On-going    Target Date  10/01/19      PT LONG TERM GOAL #3   Title  Patient will report having BM's at least every-other day with consistency between [facility] stool scale 3-5 over the prior week to demonstrate decreased constipation.    Baseline  Having to strain to have a BM, type 1 stool.    Time  10    Period  Weeks    Status  On-going    Target Date  10/01/19            Plan - 08/05/19 1500    Clinical Impression Statement  Pt. Responded well to all interventions today, demonstrating improved recruitment and timing of deep-core musculature as well as understanding and correct performance of all education and exercises provided today. They will continue to benefit from skilled physical therapy to work toward remaining goals and maximize function as well as decrease likelihood of symptom increase or recurrence.     PT Frequency  1x / week    PT Duration  Other (comment)   10 weeks   PT Next Visit Plan  review tall knee hip-hinge, weight shift, setp through, planks, and give hip ABD in side-lying and rows with band. colonic massage/bowel retraining?    PT Home Exercise Plan  3-way wall stretch, TA in quad, tall kneeling squats, urge suppression, heel-lift in L shoe, self external TP release/posterior fourchette, vitamin E oil and coconut, hip-flexor stretch/hold release, squatty potty, knee planks    Consulted and Agree with Plan of Care  Patient       Patient will benefit from skilled therapeutic intervention in order to improve the  following deficits and impairments:     Visit Diagnosis: 1. Abnormal posture   2. Other muscle spasm   3. Muscle weakness (generalized)   4. Other lack of coordination        Problem List Patient Active Problem List   Diagnosis Date Noted  . [name] deficiency 12/26/2018  . Osteoarthritis of knees, bilateral 05/26/2017  . Primary open angle glaucoma of both eyes 11/21/2016  . Chronic kidney disease (CKD), stage III (moderate) ([place]) 04/06/2016  . Cystitis 10/12/2015  . Benign hypertension 08/21/2015  . Insomnia, persistent 08/21/2015  . Dyslipidemia 08/21/2015  . Gastro-esophageal reflux disease without esophagitis 08/21/2015  . Glaucoma 08/21/2015  . Blood glucose elevated 08/21/2015  . Adult hypothyroidism 08/21/2015  . Climacteric 08/21/2015  . Senile osteoporosis 08/21/2015  . Seasonal allergies 08/21/2015  . Female genuine stress incontinence 08/21/2015  . Vitamin D deficiency 08/21/2015   Willa Rough DPT, ATC Willa Rough 08/05/2019, 3:12 PM  [place] MAIN [facility] SERVICES [address] [place], [place], [address] Phone: [phone]   Fax:  [phone]  Name: Dawn Fuentes MRN: [id number] Date of Birth: 06/09/1946

## 2019-08-12 ENCOUNTER — Ambulatory Visit: Payer: PPO

## 2019-08-12 DIAGNOSIS — M6281 Muscle weakness (generalized): Secondary | ICD-10-CM

## 2019-08-12 DIAGNOSIS — R293 Abnormal posture: Secondary | ICD-10-CM

## 2019-08-12 DIAGNOSIS — R278 Other lack of coordination: Secondary | ICD-10-CM

## 2019-08-12 DIAGNOSIS — M62838 Other muscle spasm: Secondary | ICD-10-CM

## 2019-08-12 NOTE — Therapy (Signed)
[place] MAIN [facility] SERVICES [address] [place], [place], [address] Phone: [phone]   Fax:  [phone]  Physical Therapy Treatment  The patient has been informed of current processes in place at Outpatient Rehab to protect patients from Covid-19 exposure including social distancing, schedule modifications, and new cleaning procedures. After discussing their particular risk with a therapist based on the patient's personal risk factors, the patient has decided to proceed with in-person therapy.  Patient Details  Name: Dawn Fuentes MRN: [id number] Date of Birth: 11-Jan-1946 Referring Provider (PT): Steele Sizer   Encounter Date: 08/12/2019  PT End of Session - 08/12/19 1453    Visit Number  9    Number of Visits  17    Date for PT Re-Evaluation  07/31/19    Authorization Time Period  10/02/2019    Authorization - Visit Number  2    Authorization - Number of Visits  10    PT Start Time  1330    PT Stop Time  [id number]    PT Time Calculation (min)  67 min    Activity Tolerance  Patient tolerated treatment well;No increased pain    Behavior During Therapy  WFL for tasks assessed/performed       Past Medical History:  Diagnosis Date  . Allergy   . Anxiety    ER visit in Sept 2017 for anxiety  . Arthritis   . Chronic insomnia   . Colon adenoma   . Dyslipidemia   . Dysphagia   . Female stress incontinence   . GERD (gastroesophageal reflux disease)   . [facility], both eyes    [facility]  . Hyperlipidemia   . Hypertension    controlled on meds  . Hypothyroidism   . Menopause syndrome   . Osteoporosis   . Polyp of colon   . Renal insufficiency    mild- keeping an eye on  . Vitamin D deficiency     Past Surgical History:  Procedure Laterality Date  . ABDOMINAL HYSTERECTOMY     21 years ago  . APPENDECTOMY    . COLONOSCOPY WITH PROPOFOL N/A 05/29/2015   Procedure: COLONOSCOPY WITH PROPOFOL;  Surgeon:  Hulen Luster, MD;  Location: [facility] ENDOSCOPY;  Service: Gastroenterology;  Laterality: N/A;  . ESOPHAGOGASTRODUODENOSCOPY N/A 05/29/2015   Procedure: ESOPHAGOGASTRODUODENOSCOPY (EGD);  Surgeon: Hulen Luster, MD;  Location: [facility] ENDOSCOPY;  Service: Gastroenterology;  Laterality: N/A;  . EYE SURGERY     eye lids  . OVARIAN CYST REMOVAL     needed transfusion  . PHOTOCOAGULATION WITH LASER Right 11/21/2016   Procedure: PHOTOCOAGULATION WITH LASER;  Surgeon: Ronnell Freshwater, MD;  Location: [place];  Service: Ophthalmology;  Laterality: Right;  A micropulse probe was applied to each hemilimbus with the following settings: 2030mW, 31.3% duty cycle, 90 seconds x 3 applications.     There were no vitals filed for this visit.    Pelvic Floor Physical Therapy Treatment Note  SCREENING  Changes in medications, allergies, or medical history?: none  SUBJECTIVE  Patient reports: "might be some" leakage if she has a sneezing/coughing fit. Having some L LBP today. Found her planks hard to do so she is not doing it. Reports feeling sore following last session, faded within 1-2 days (DOMS)  Precautions:  See History  Pain update: Low back on L is bothering her "off-and-on"  6/10 worst, 0/10 when sitting still 2/10 with AMB today.  ** Pt. Experiencing  increased irritation to L Piriformis following treatment from short duration hold with tennis ball release.  Patient Goals: To be able to not have to wear Pads and not have urinary incontinence.   OBJECTIVE  Changes in: Posture/Observations:  Pt. Found heel-lift. Wearing rubber only,not both. Given extra 1/8 in. Wedge and looked more aligned in standing with this addition  Palpation: TTP to L Piriformis, L lumbar paraspinals near ~L2, L Psoas distally  Mobility/ROM: Decreased sacral mobility at all sacral borders and through ~ T12-L3  INTERVENTIONS THIS SESSION: Therex: reviewed kneeling planks and modified to on elbows to  decrease pain in wrists with performance.  Manual: Educated on how to perform self TP release with tennis ball to continue to decrease pain and spasm surrounding the LB and hip for decreased pain. Performed TP release to L Piriformis, L lumbar paraspinals near ~L2, L Psoas distally to decrease spasm and pain and allow for improved balance of musculature for improved function and decreased symptoms.   Total time: 67 min.                                   PT Short Term Goals - 07/24/19 [id number]      PT SHORT TERM GOAL #1   Title  Patient will demonstrate improved pelvic alignment and balance of musculature surrounding the pelvis to facilitate decreased PFM spasms and improve PFM function for decreased SUI.    Baseline  spasms surrounding the pelvis and RLE long by 1.5 cm.    Time  5    Period  Weeks    Status  Achieved    Target Date  06/27/19      PT SHORT TERM GOAL #2   Title  Patient will report consistent use of foot-stool (squatty-potty) for positioning with BM to decrease pain with BM and intra-abdominal pressure    Baseline  Pt. straining to have BM daily    Time  5    Period  Weeks    Status  Achieved    Target Date  06/27/19      PT SHORT TERM GOAL #3   Title  Pt. will drink 64 oz. of water per day, decrease caffinated beverages to 1 per day and successfully implement urge suppression technique to decrease urge incontinence.    Baseline  Pt. only drinking 24 oz of water with other fluids being caffinated. not aware of urge suppression technique. As of 8/4: Pt. is able to use Urge suppresion tchnique effectively but only drinking 24-32 oz. of H2O per day.    Time  5    Period  Weeks    Status  On-going    Target Date  06/27/19        PT Long Term Goals - 07/23/19 1117      PT LONG TERM GOAL #1   Title  Patient will report no episodes of SUI or UUI over the course of the prior two weeks to demonstrate improved functional ability.    Baseline   Having SUI and UUI daily, using 2-3 #3 pads daily. Still using 2-3 pads per day but mostly for hygeine not because they are wet.    Time  10    Period  Weeks    Status  On-going    Target Date  10/01/19      PT LONG TERM GOAL #2   Title  Patient will score at or below  60/300 on the PFDI to demonstrate a clinically meaningful decrease in disability and distress due to pelvic floor dysfunction.    Baseline  PFDI: 105/300     Time  10    Period  Weeks    Status  On-going    Target Date  10/01/19      PT LONG TERM GOAL #3   Title  Patient will report having BM's at least every-other day with consistency between [facility] stool scale 3-5 over the prior week to demonstrate decreased constipation.    Baseline  Having to strain to have a BM, type 1 stool.    Time  10    Period  Weeks    Status  On-going    Target Date  10/01/19            Plan - 08/12/19 1505    Clinical Impression Statement  Pt. Responded well to all interventions today, demonstrating improved ability to perform planks with modification and improved sacral and lumbar mobility as well as understanding and correct performance of all education and exercises provided today. They will continue to benefit from skilled physical therapy to work toward remaining goals and maximize function as well as decrease likelihood of symptom increase or recurrence.     PT Frequency  1x / week    PT Duration  Other (comment)   10 weeks   PT Next Visit Plan  TPDN? review tall knee hip-hinge, weight shift, setp through, planks, and give hip ABD in side-lying and rows with band. colonic massage/bowel retraining?    PT Home Exercise Plan  3-way wall stretch, TA in quad, tall kneeling squats, urge suppression, heel-lift in L shoe, self external TP release/posterior fourchette, vitamin E oil and coconut, hip-flexor stretch/hold release, squatty potty, elbow knee planks, self TP release with tennis ball,    Consulted and Agree with Plan of Care   Patient       Patient will benefit from skilled therapeutic intervention in order to improve the following deficits and impairments:     Visit Diagnosis: Abnormal posture  Other muscle spasm  Muscle weakness (generalized)  Other lack of coordination     Problem List Patient Active Problem List   Diagnosis Date Noted  . [name] deficiency 12/26/2018  . Osteoarthritis of knees, bilateral 05/26/2017  . Primary open angle glaucoma of both eyes 11/21/2016  . Chronic kidney disease (CKD), stage III (moderate) ([place]) 04/06/2016  . Cystitis 10/12/2015  . Benign hypertension 08/21/2015  . Insomnia, persistent 08/21/2015  . Dyslipidemia 08/21/2015  . Gastro-esophageal reflux disease without esophagitis 08/21/2015  . Glaucoma 08/21/2015  . Blood glucose elevated 08/21/2015  . Adult hypothyroidism 08/21/2015  . Climacteric 08/21/2015  . Senile osteoporosis 08/21/2015  . Seasonal allergies 08/21/2015  . Female genuine stress incontinence 08/21/2015  . Vitamin D deficiency 08/21/2015   Willa Rough DPT, ATC Willa Rough 08/12/2019, 3:07 PM  [place] MAIN [facility] SERVICES [address] [place], [place], [address] Phone: [phone]   Fax:  [phone]  Name: Dawn Fuentes MRN: [id number] Date of Birth: [birth year]

## 2019-08-12 NOTE — Patient Instructions (Signed)
   Follow directions for planks from prior handout with this position.     This is The QL muscle  To perform release on this muscle, start by getting into this position by bridging the hips up and then slowly lowering your back, then your butt down to lengthen the low back then put the ball under you where you feel the tender spot and roll to the same side slightly to add pressure as needed. Hold still and take deep breaths until the pain is at least 50% less or, ideally, just pressure.   This is your piriformis    To release this muscle start in this position with your ankle crossed over the opposite knee. Place the tennis ball under your buttock where the tender spot is and then slightly roll your weight to the same side to put just enough pressure that it is uncomfortable. Hold and take deep breaths until the pain is at least 50% less or, ideally ,just pressure.   This is your Gluteus Medius and Minimus  To perform release on this muscle, start by getting into this position by bridging the hips up and then slowly lowering your back, then your butt down to lengthen the low back then put the ball under you where you feel the tender spot and roll to the same side slightly to add pressure as needed. Hold still and take deep breaths until the pain is at least 50% less or, ideally, just pressure.   These are your deep hip-flexor muscles. They are easiest to reach where they come together at the hip.   To perform release on this muscle, start by getting into this position by laying on your stomach then put the ball under you where you feel the tender spot and bring the opposite knee up/out to the side to add pressure as needed. Hold still and take deep breaths until the pain is at least 50% less or, ideally ,just pressure.

## 2019-08-14 ENCOUNTER — Other Ambulatory Visit: Payer: Self-pay | Admitting: Family Medicine

## 2019-08-14 DIAGNOSIS — G4700 Insomnia, unspecified: Secondary | ICD-10-CM

## 2019-08-14 NOTE — Telephone Encounter (Signed)
Requested medication (s) are due for refill today: yes  Requested medication (s) are on the active medication list: yes  Last refill:  04/30/2019  Future visit scheduled: yes  Notes to clinic: This refill cannot be delegated   Requested Prescriptions  Pending Prescriptions Disp Refills   zolpidem (AMBIEN) 5 MG tablet [Pharmacy Med Name: ZOLPIDEM 5MG  TABLETS] 90 tablet     Sig: TAKE 1 TABLET(5 MG) BY MOUTH AT BEDTIME AS NEEDED FOR SLEEP     Not Delegated - Psychiatry:  Anxiolytics/Hypnotics Failed - 08/14/2019 10:12 AM      Failed - This refill cannot be delegated      Failed - Urine Drug Screen completed in last 360 days.      Passed - Valid encounter within last 6 months    Recent Outpatient Visits          2 months ago Well woman exam   [facility] Steele Sizer, MD   3 months ago Neck pain on right side   [place], FNP   3 months ago Foot pain, right   [place], FNP   3 months ago CKD (chronic kidney disease), stage III [facility])   [facility] Steele Sizer, MD   10 months ago Acute cystitis with hematuria   [place], FNP      Future Appointments            In 1 week  [facility], [place]   In 1 week Steele Sizer, MD [facility], [facility]

## 2019-08-23 ENCOUNTER — Encounter: Payer: Self-pay | Admitting: Family Medicine

## 2019-08-23 ENCOUNTER — Other Ambulatory Visit: Payer: Self-pay

## 2019-08-23 ENCOUNTER — Ambulatory Visit: Payer: PPO

## 2019-08-23 ENCOUNTER — Ambulatory Visit (INDEPENDENT_AMBULATORY_CARE_PROVIDER_SITE_OTHER): Payer: PPO | Admitting: Family Medicine

## 2019-08-23 VITALS — BP 138/80 | HR 80 | Temp 97.3°F | Resp 16 | Ht 65.5 in | Wt 175.6 lb

## 2019-08-23 DIAGNOSIS — E038 Other specified hypothyroidism: Secondary | ICD-10-CM

## 2019-08-23 DIAGNOSIS — E559 Vitamin D deficiency, unspecified: Secondary | ICD-10-CM | POA: Diagnosis not present

## 2019-08-23 DIAGNOSIS — I10 Essential (primary) hypertension: Secondary | ICD-10-CM | POA: Diagnosis not present

## 2019-08-23 DIAGNOSIS — K219 Gastro-esophageal reflux disease without esophagitis: Secondary | ICD-10-CM

## 2019-08-23 DIAGNOSIS — Z23 Encounter for immunization: Secondary | ICD-10-CM

## 2019-08-23 DIAGNOSIS — E785 Hyperlipidemia, unspecified: Secondary | ICD-10-CM | POA: Diagnosis not present

## 2019-08-23 DIAGNOSIS — E538 Deficiency of other specified B group vitamins: Secondary | ICD-10-CM | POA: Diagnosis not present

## 2019-08-23 DIAGNOSIS — N183 Chronic kidney disease, stage 3 unspecified: Secondary | ICD-10-CM

## 2019-08-23 DIAGNOSIS — M170 Bilateral primary osteoarthritis of knee: Secondary | ICD-10-CM

## 2019-08-23 DIAGNOSIS — G4700 Insomnia, unspecified: Secondary | ICD-10-CM | POA: Diagnosis not present

## 2019-08-23 DIAGNOSIS — J3089 Other allergic rhinitis: Secondary | ICD-10-CM

## 2019-08-23 DIAGNOSIS — N898 Other specified noninflammatory disorders of vagina: Secondary | ICD-10-CM

## 2019-08-23 DIAGNOSIS — M545 Low back pain, unspecified: Secondary | ICD-10-CM

## 2019-08-23 DIAGNOSIS — F411 Generalized anxiety disorder: Secondary | ICD-10-CM

## 2019-08-23 DIAGNOSIS — J302 Other seasonal allergic rhinitis: Secondary | ICD-10-CM

## 2019-08-23 MED ORDER — LEVOCETIRIZINE DIHYDROCHLORIDE 5 MG PO TABS: 5.0000 mg | ORAL_TABLET | Freq: Every evening | ORAL | 1 refills | Status: DC

## 2019-08-23 MED ORDER — SIMVASTATIN 40 MG PO TABS: 40.0000 mg | ORAL_TABLET | Freq: Every day | ORAL | 1 refills | Status: DC

## 2019-08-23 MED ORDER — PANTOPRAZOLE SODIUM 40 MG PO TBEC: 40.0000 mg | DELAYED_RELEASE_TABLET | Freq: Every day | ORAL | 1 refills | Status: DC

## 2019-08-23 MED ORDER — LEVOTHYROXINE SODIUM 50 MCG PO TABS: ORAL_TABLET | ORAL | 1 refills | Status: DC

## 2019-08-23 MED ORDER — DICLOFENAC EPOLAMINE 1.3 % TD PTCH: 1.0000 | MEDICATED_PATCH | Freq: Two times a day (BID) | TRANSDERMAL | 2 refills | Status: DC

## 2019-08-23 MED ORDER — ALPRAZOLAM 0.5 MG PO TABS: 0.5000 mg | ORAL_TABLET | Freq: Every day | ORAL | 0 refills | Status: DC | PRN

## 2019-08-23 MED ORDER — LOSARTAN POTASSIUM 50 MG PO TABS: ORAL_TABLET | ORAL | 1 refills | Status: DC

## 2019-08-23 MED ORDER — ZOLPIDEM TARTRATE 5 MG PO TABS: ORAL_TABLET | ORAL | 1 refills | Status: DC

## 2019-08-23 MED ORDER — CITALOPRAM HYDROBROMIDE 10 MG PO TABS: 10.0000 mg | ORAL_TABLET | Freq: Every day | ORAL | 1 refills | Status: DC

## 2019-08-23 MED ORDER — MONTELUKAST SODIUM 10 MG PO TABS: 10.0000 mg | ORAL_TABLET | Freq: Every day | ORAL | 1 refills | Status: DC | PRN

## 2019-08-23 NOTE — Patient Instructions (Signed)
Back Exercises The following exercises strengthen the muscles that help to support the trunk and back. They also help to keep the lower back flexible. Doing these exercises can help to prevent back pain or lessen existing pain.  If you have back pain or discomfort, try doing these exercises 2-3 times each day or as told by your health care provider.  As your pain improves, do them once each day, but increase the number of times that you repeat the steps for each exercise (do more repetitions).  To prevent the recurrence of back pain, continue to do these exercises once each day or as told by your health care provider. Do exercises exactly as told by your health care provider and adjust them as directed. It is normal to feel mild stretching, pulling, tightness, or discomfort as you do these exercises, but you should stop right away if you feel sudden pain or your pain gets worse. Exercises Single knee to chest Repeat these steps 3-5 times for each leg: 1. Lie on your back on a firm bed or the floor with your legs extended. 2. Bring one knee to your chest. Your other leg should stay extended and in contact with the floor. 3. Hold your knee in place by grabbing your knee or thigh with both hands and hold. 4. Pull on your knee until you feel a gentle stretch in your lower back or buttocks. 5. Hold the stretch for 10-30 seconds. 6. Slowly release and straighten your leg. Pelvic tilt Repeat these steps 5-10 times: 1. Lie on your back on a firm bed or the floor with your legs extended. 2. Bend your knees so they are pointing toward the ceiling and your feet are flat on the floor. 3. Tighten your lower abdominal muscles to press your lower back against the floor. This motion will tilt your pelvis so your tailbone points up toward the ceiling instead of pointing to your feet or the floor. 4. With gentle tension and even breathing, hold this position for 5-10 seconds. Cat-cow Repeat these steps until  your lower back becomes more flexible: 1. Get into a hands-and-knees position on a firm surface. Keep your hands under your shoulders, and keep your knees under your hips. You may place padding under your knees for comfort. 2. Let your head hang down toward your chest. Contract your abdominal muscles and point your tailbone toward the floor so your lower back becomes rounded like the back of a cat. 3. Hold this position for 5 seconds. 4. Slowly lift your head, let your abdominal muscles relax and point your tailbone up toward the ceiling so your back forms a sagging arch like the back of a cow. 5. Hold this position for 5 seconds.  Press-ups Repeat these steps 5-10 times: 1. Lie on your abdomen (face-down) on the floor. 2. Place your palms near your head, about shoulder-width apart. 3. Keeping your back as relaxed as possible and keeping your hips on the floor, slowly straighten your arms to raise the top half of your body and lift your shoulders. Do not use your back muscles to raise your upper torso. You may adjust the placement of your hands to make yourself more comfortable. 4. Hold this position for 5 seconds while you keep your back relaxed. 5. Slowly return to lying flat on the floor.  Bridges Repeat these steps 10 times: 1. Lie on your back on a firm surface. 2. Bend your knees so they are pointing toward the ceiling and   your feet are flat on the floor. Your arms should be flat at your sides, next to your body. 3. Tighten your buttocks muscles and lift your buttocks off the floor until your waist is at almost the same height as your knees. You should feel the muscles working in your buttocks and the back of your thighs. If you do not feel these muscles, slide your feet 1-2 inches farther away from your buttocks. 4. Hold this position for 3-5 seconds. 5. Slowly lower your hips to the starting position, and allow your buttocks muscles to relax completely. If this exercise is too easy, try  doing it with your arms crossed over your chest. Abdominal crunches Repeat these steps 5-10 times: 1. Lie on your back on a firm bed or the floor with your legs extended. 2. Bend your knees so they are pointing toward the ceiling and your feet are flat on the floor. 3. Cross your arms over your chest. 4. Tip your chin slightly toward your chest without bending your neck. 5. Tighten your abdominal muscles and slowly raise your trunk (torso) high enough to lift your shoulder blades a tiny bit off the floor. Avoid raising your torso higher than that because it can put too much stress on your low back and does not help to strengthen your abdominal muscles. 6. Slowly return to your starting position. Back lifts Repeat these steps 5-10 times: 1. Lie on your abdomen (face-down) with your arms at your sides, and rest your forehead on the floor. 2. Tighten the muscles in your legs and your buttocks. 3. Slowly lift your chest off the floor while you keep your hips pressed to the floor. Keep the back of your head in line with the curve in your back. Your eyes should be looking at the floor. 4. Hold this position for 3-5 seconds. 5. Slowly return to your starting position. Contact a health care provider if:  Your back pain or discomfort gets much worse when you do an exercise.  Your worsening back pain or discomfort does not lessen within 2 hours after you exercise. If you have any of these problems, stop doing these exercises right away. Do not do them again unless your health care provider says that you can. Get help right away if:  You develop sudden, severe back pain. If this happens, stop doing the exercises right away. Do not do them again unless your health care provider says that you can. This information is not intended to replace advice given to you by your health care provider. Make sure you discuss any questions you have with your health care provider. Document Released: 01/12/2005 Document  Revised: 04/11/2019 Document Reviewed: 09/06/2018 Else[employer] Patient Education  2020 [employer]. Sacroiliac Joint Dysfunction  Sacroiliac joint dysfunction is a condition that causes inflammation on one or both sides of the sacroiliac (SI) joint. The SI joint connects the lower part of the spine (sacrum) with the two upper portions of the pelvis (ilium). This condition causes deep aching or burning pain in the low back. In some cases, the pain may also spread into one or both buttocks, hips, or thighs. What are the causes? This condition may be caused by:  Pregnancy. During pregnancy, extra stress is put on the SI joints because the pelvis widens.  Injury, such as: ? Injuries from car accidents. ? Sports-related injuries. ? Work-related injuries.  Having one leg that is shorter than the other.  Conditions that affect the joints, such as: ? Rheumatoid  arthritis. ? Gout. ? Psoriatic arthritis. ? Joint infection (septic arthritis). Sometimes, the cause of SI joint dysfunction is not known. What are the signs or symptoms? Symptoms of this condition include:  Aching or burning pain in the lower back. The pain may also spread to other areas, such as: ? Buttocks. ? Groin. ? Thighs.  Muscle spasms in or around the painful areas.  Increased pain when standing, walking, running, stair climbing, bending, or lifting. How is this diagnosed? This condition is diagnosed with a physical exam and medical history. During the exam, the health care provider may move one or both of your legs to different positions to check for pain. Various tests may be done to confirm the diagnosis, including:  Imaging tests to look for other causes of pain. These may include: ? MRI. ? CT scan. ? Bone scan.  Diagnostic injection. A numbing medicine is injected into the SI joint using a needle. If your pain is temporarily improved or stopped after the injection, this can indicate that SI joint dysfunction is  the problem. How is this treated? Treatment depends on the cause and severity of your condition. Treatment options may include:  Ice or heat applied to the lower back area after an injury. This may help reduce pain and muscle spasms.  Medicines to relieve pain or inflammation or to relax the muscles.  Wearing a back brace (sacroiliac brace) to help support the joint while your back is healing.  Physical therapy to increase muscle strength around the joint and flexibility at the joint. This may also involve learning proper body positions and ways of moving to relieve stress on the joint.  Direct manipulation of the SI joint.  Injections of steroid medicine into the joint to reduce pain and swelling.  Radiofrequency ablation to burn away nerves that are carrying pain messages from the joint.  Use of a device that provides electrical stimulation to help reduce pain at the joint.  Surgery to put in screws and plates that limit or prevent joint motion. This is rare. Follow these instructions at home: Medicines  Take over-the-counter and prescription medicines only as told by your health care provider.  Do not drive or use heavy machinery while taking prescription pain medicine.  If you are taking prescription pain medicine, take actions to prevent or treat constipation. Your health care provider may recommend that you: ? Drink enough fluid to keep your urine pale yellow. ? Eat foods that are high in fiber, such as fresh fruits and vegetables, whole grains, and beans. ? Limit foods that are high in fat and processed sugars, such as fried or sweet foods. ? Take an over-the-counter or prescription medicine for constipation. If you have a brace:  Wear the brace as told by your health care provider. Remove it only as told by your health care provider.  Keep the brace clean.  If the brace is not waterproof: ? Do not let it get wet. ? Cover it with a watertight covering when you take a  bath or a shower. Managing pain, stiffness, and swelling      Icing can help with pain and swelling. Heat may help with muscle tension or spasms. Ask your health care provider if you should use ice or heat.  If directed, put ice on the affected area: ? If you have a removable brace, remove it as told by your health care provider. ? Put ice in a plastic bag. ? Place a towel between your skin and  the bag. ? Leave the ice on for 20 minutes, 2-3 times a day.  If directed, apply heat to the affected area. Use the heat source that your health care provider recommends, such as a moist heat pack or a heating pad. ? Place a towel between your skin and the heat source. ? Leave the heat on for 20-30 minutes. ? Remove the heat if your skin turns bright red. This is especially important if you are unable to feel pain, heat, or cold. You may have a greater risk of getting burned. General instructions  Rest as needed. Ask your health care provider what activities are safe for you.  Return to your normal activities as told by your health care provider.  Exercise as directed by your health care provider or physical therapist.  Do not use any products that contain nicotine or tobacco, such as cigarettes and e-cigarettes. These can delay bone healing. If you need help quitting, ask your health care provider.  Keep all follow-up visits as told by your health care provider. This is important. Contact a health care provider if:  Your pain is not controlled with medicine.  You have a fever.  Your pain is getting worse. Get help right away if:  You have weakness, numbness, or tingling in your legs or feet.  You lose control of your bladder or bowel. Summary  Sacroiliac joint dysfunction is a condition that causes inflammation on one or both sides of the sacroiliac (SI) joint.  This condition causes deep aching or burning pain in the low back. In some cases, the pain may also spread into one or  both buttocks, hips, or thighs.  Treatment depends on the cause and severity of your condition. It may include medicines to reduce pain and swelling or to relax muscles. This information is not intended to replace advice given to you by your health care provider. Make sure you discuss any questions you have with your health care provider. Document Released: 03/03/2009 Document Revised: 08/01/2018 Document Reviewed: 01/15/2018 Elsevier Patient Education  2020 [employer].

## 2019-08-23 NOTE — Progress Notes (Signed)
Name: Dawn Fuentes   MRN: [id number]    DOB: 22-Mar-1946   Date:08/23/2019       Progress Note  Subjective  Chief Complaint  Chief Complaint  Patient presents with  . Hypertension  . Gastroesophageal Reflux  . Hypothyroidism  . Dyslipidemia    HPI  Acute low back pain: going on for the past couple months, she states pain is usually dull and constant but at times has a sharp sensation that goes from left side to right side, she has seen Dr. Laveda Abbe a long time ago. She cannot take nsaid's , we will try topical medication  GAD: she is feeling much better on Citalopram 10 mg,she was given 10 pills for 3 months, and we will change to 20 pills for 6 months, she denies side effects of medications   HTN: bp at home is 120's Currently on Losartan 50 mg and no side effects. She denies chest pain or palpitation, denies edema, no dizziness. BP slightly high today, we will continue current regiment   CKI: we will monitor, sheisavoiding takingNSAID's . Only taking Tylenol prn Last GFR was 44  OA knee: she has noticed worsening of pain on right knee when going up and down stairs and when first standing up from sitting position, both knees but right is worse than left. No effusion or redness, and is no limiting her activity level. She went to Ortho, had some PT and was feeling better, still has pain going up and down stairs, Tumeric is helping. Unchanged   Insomnia: she is taking Ambien 5 mg at night, it is helping her fall and stay asleep, she needs refill next month and we will send it today   Hypothyroidism: no palpitation, change in bowel movements or dysphagia. Taking medication daily as prescribed. Last TSH at goal Unchanged   GERD: unable to wean self off PPI, she is back on it daily, no heartburn of indigestion as long as she takes medication.Discussed long term risk of medication again, but unable to stop PPI . Unchanged   Osteoporosis: she took fosamax long time ago, and is  afraid to go back on therapy, she gets prolia, had a fracture of right foot a couple of months ago, had to wear a boot , pain has resolved  RecurrentUTI: she was treated October at our office and went to Urgent Care Feb 2020. She went back 05/2019 with another episode, discussed trying to call us if she has symptoms to avoid urgent care visits  Vulva soreness: she states for months she has noticed a sore on the left side of vaginal introitus, no bleeding, she has been sexually active with a new partner, but states sore was present prior to that, resolved with otc monistat but recurs. She has not been using topical medication given to her at GYN office.   Patient Active Problem List   Diagnosis Date Noted  . [name] deficiency 12/26/2018  . Osteoarthritis of knees, bilateral 05/26/2017  . Primary open angle glaucoma of both eyes 11/21/2016  . Chronic kidney disease (CKD), stage III (moderate) ([place]) 04/06/2016  . Cystitis 10/12/2015  . Benign hypertension 08/21/2015  . Insomnia, persistent 08/21/2015  . Dyslipidemia 08/21/2015  . Gastro-esophageal reflux disease without esophagitis 08/21/2015  . Glaucoma 08/21/2015  . Blood glucose elevated 08/21/2015  . Adult hypothyroidism 08/21/2015  . Climacteric 08/21/2015  . Senile osteoporosis 08/21/2015  . Seasonal allergies 08/21/2015  . Female genuine stress incontinence 08/21/2015  . Vitamin D deficiency 08/21/2015  Past Surgical History:  Procedure Laterality Date  . ABDOMINAL HYSTERECTOMY     21 years ago  . APPENDECTOMY    . COLONOSCOPY WITH PROPOFOL N/A 05/29/2015   Procedure: COLONOSCOPY WITH PROPOFOL;  Surgeon: Hulen Luster, MD;  Location: [facility] ENDOSCOPY;  Service: Gastroenterology;  Laterality: N/A;  . ESOPHAGOGASTRODUODENOSCOPY N/A 05/29/2015   Procedure: ESOPHAGOGASTRODUODENOSCOPY (EGD);  Surgeon: Hulen Luster, MD;  Location: [facility] ENDOSCOPY;  Service: Gastroenterology;  Laterality: N/A;  . EYE SURGERY     eye lids  . OVARIAN CYST  REMOVAL     needed transfusion  . PHOTOCOAGULATION WITH LASER Right 11/21/2016   Procedure: PHOTOCOAGULATION WITH LASER;  Surgeon: Ronnell Freshwater, MD;  Location: [place];  Service: Ophthalmology;  Laterality: Right;  A micropulse probe was applied to each hemilimbus with the following settings: 2049mW, 31.3% duty cycle, 90 seconds x 3 applications.     Family History  Problem Relation Age of Onset  . Hypertension Mother   . Osteoporosis Mother   . COPD Mother   . Dementia Mother   . Cancer Father        Colon  . Hypothyroidism Daughter   . Aneurysm Brother   . COPD Sister     Social History   Socioeconomic History  . Marital status: Widowed    Spouse name: Shanon Brow  . Number of children: 1  . Years of education: Not on file  . Highest education level: 12th grade  Occupational History  . Occupation: Retired  Scientific laboratory technician  . Financial resource strain: Not hard at all  . Food insecurity    Worry: Never true    Inability: Never true  . Transportation needs    Medical: No    Non-medical: No  Tobacco Use  . Smoking status: Never Smoker  . Smokeless tobacco: Never Used  . Tobacco comment: smoking cessation materials not required  Substance and Sexual Activity  . Alcohol use: No    Alcohol/week: 0.0 standard drinks  . Drug use: No  . Sexual activity: Not Currently    Partners: Male  Lifestyle  . Physical activity    Days per week: 3 days    Minutes per session: 60 min  . Stress: Not at all  Relationships  . Social Herbalist on phone: Patient refused    Gets together: Patient refused    Attends religious service: Patient refused    Active member of club or organization: Patient refused    Attends meetings of clubs or organizations: Patient refused    Relationship status: Widowed  . Intimate partner violence    Fear of current or ex partner: No    Emotionally abused: No    Physically abused: No    Forced sexual activity: No  Other  Topics Concern  . Not on file  Social History Narrative   Widow since 2013   Started to date in 2019     Current Outpatient Medications:  .  ALPRAZolam (XANAX) 0.5 MG tablet, Take 1 tablet (0.5 mg total) by mouth at bedtime as needed for anxiety., Disp: 10 tablet, Rfl: 0 .  aspirin 81 MG tablet, Take 81 mg by mouth daily. pm, Disp: , Rfl:  .  cholecalciferol (VITAMIN D) 1000 units tablet, Take 1,000 Units by mouth 2 (two) times daily. am, Disp: , Rfl:  .  citalopram (CELEXA) 10 MG tablet, Take 1 tablet (10 mg total) by mouth daily., Disp: 90 tablet, Rfl: 0 .  denosumab (  PROLIA) 60 MG/ML SOSY injection, Inject into the skin., Disp: , Rfl:  .  fluticasone (FLONASE) 50 MCG/ACT nasal spray, Place 2 sprays into both nostrils daily. Pm, Disp: 16 g, Rfl: 2 .  INTRAROSA 6.5 MG INST, I 1 SUP VAG HS, Disp: , Rfl:  .  levocetirizine (XYZAL) 5 MG tablet, Take 1 tablet (5 mg total) by mouth every evening., Disp: 90 tablet, Rfl: 1 .  levothyroxine (SYNTHROID) 50 MCG tablet, TAKE 1 TABLET BY MOUTH EVERY DAY BEFORE BREAKFAST AND TAKE 2 TABLETS ON SUNDAY, Disp: 102 tablet, Rfl: 1 .  losartan (COZAAR) 50 MG tablet, TAKE 1 TABLET BY MOUTH DAILY, Disp: 90 tablet, Rfl: 0 .  Melatonin 3 MG CAPS, Take 1 capsule (3 mg total) by mouth daily., Disp: 90 capsule, Rfl: 0 .  montelukast (SINGULAIR) 10 MG tablet, Take 1 tablet (10 mg total) by mouth daily as needed (allergies)., Disp: 90 tablet, Rfl: 1 .  Omega-3 Fatty Acids (FISH OIL PO), Take by mouth. AM, Disp: , Rfl:  .  pantoprazole (PROTONIX) 40 MG tablet, Take 1 tablet (40 mg total) by mouth daily., Disp: 90 tablet, Rfl: 1 .  simvastatin (ZOCOR) 40 MG tablet, Take 1 tablet (40 mg total) by mouth daily. pm, Disp: 90 tablet, Rfl: 1 .  timolol (TIMOPTIC) 0.5 % ophthalmic solution, INT 1 GTT IN OU BID, Disp: , Rfl: 5 .  VYZULTA 0.024 % SOLN, INT 1 GTT INTO OU AT NIGHT, Disp: , Rfl: 2 .  zolpidem (AMBIEN) 5 MG tablet, TAKE 1 TABLET(5 MG) BY MOUTH AT BEDTIME AS NEEDED  FOR SLEEP, Disp: 30 tablet, Rfl: 0 .  cephALEXin (KEFLEX) 500 MG capsule, Take 1 capsule (500 mg total) by mouth 2 (two) times daily. (Patient not taking: Reported on 08/23/2019), Disp: 14 capsule, Rfl: 0  Current Facility-Administered Medications:  .  cyanocobalamin ((VITAMIN B-12)) injection 1,000 mcg, 1,000 mcg, Intramuscular, Q30 days, Steele Sizer, MD, 1,000 mcg at 08/23/19 1146  Allergies  Allergen Reactions  . Brimonidine Tartrate   . Latanoprost   . Raloxifene     bone pain  . Sulfa Antibiotics Nausea Only    Intolerance rather than allergy.    I personally reviewed active problem list, medication list, allergies, family history, social history, health maintenance with the patient/caregiver today.   ROS  Constitutional: Negative for fever positive for  weight change.  Respiratory: Negative for cough and shortness of breath.   Cardiovascular: Negative for chest pain or palpitations.  Gastrointestinal: Negative for abdominal pain, no bowel changes.  Musculoskeletal: Negative for gait problem or joint swelling.  Skin: Negative for rash.  Neurological: Negative for dizziness or headache.  No other specific complaints in a complete review of systems (except as listed in HPI above).  Objective  Vitals:   08/23/19 1151  BP: 138/80  Pulse: 80  Resp: 16  Temp: (!) 97.3 F (36.3 C)  TempSrc: Temporal  SpO2: 97%  Weight: 175 lb 9.6 oz (79.7 kg)  Height: 5' 5.5" (1.664 m)    Body mass index is 28.78 kg/m.  Physical Exam  Constitutional: Patient appears well-developed and well-nourished. Obese  No distress.  HEENT: head atraumatic, normocephalic, pupils equal and reactive to light Cardiovascular: Normal rate, regular rhythm and normal heart sounds.  No murmur heard. No BLE edema. Pulmonary/Chest: Effort normal and breath sounds normal. No respiratory distress. Abdominal: Soft.  There is no tenderness. Psychiatric: Patient has a normal mood and affect. behavior is  normal. Judgment and thought content normal. Muscular  skeletal: no pain during palpation of lumbar spine, negative straight leg raise. Normal rom of spine  GYN; flat area with some cracking and tenderness during palpation on right side of vaginal introitus, atrophic vulva.   Recent Results (from the past 2160 hour(s))  Urinalysis, Complete w Microscopic     Status: Abnormal   Collection Time: 06/02/19  1:12 PM  Result Value Ref Range   Color, Urine YELLOW YELLOW   APPearance HAZY (A) CLEAR   Specific Gravity, Urine 1.010 1.005 - 1.030   pH 5.5 5.0 - 8.0   Glucose, UA NEGATIVE NEGATIVE mg/dL   Hgb urine dipstick TRACE (A) NEGATIVE   Bilirubin Urine NEGATIVE NEGATIVE   Ketones, ur NEGATIVE NEGATIVE mg/dL   Protein, ur NEGATIVE NEGATIVE mg/dL   Nitrite NEGATIVE NEGATIVE   Leukocytes,Ua MODERATE (A) NEGATIVE   Squamous Epithelial / LPF 0-5 0 - 5   WBC, UA >50 0 - 5 WBC/hpf   RBC / HPF 0-5 0 - 5 RBC/hpf   Bacteria, UA FEW (A) NONE SEEN   WBC Clumps PRESENT     Comment: Performed at [facility], [address], [place], [place] [address]  Urine culture     Status: Abnormal   Collection Time: 06/02/19  1:12 PM   Specimen: Urine, Clean Catch  Result Value Ref Range   Specimen Description      URINE, CLEAN CATCH Performed at [facility], [address], [place], [place] [address]    Special Requests      Normal Performed at [facility], [address], [place], [place] [address]    Culture 50,000 COLONIES/mL ESCHERICHIA COLI (A)    Report Status 06/05/2019 FINAL    Organism ID, Bacteria ESCHERICHIA COLI (A)       Susceptibility   Escherichia coli - MIC*    AMPICILLIN 4 SENSITIVE Sensitive     CEFAZOLIN <=4 SENSITIVE Sensitive     CEFTRIAXONE <=1 SENSITIVE Sensitive     CIPROFLOXACIN >=4 RESISTANT Resistant     GENTAMICIN <=1 SENSITIVE Sensitive     IMIPENEM <=0.25 SENSITIVE Sensitive     NITROFURANTOIN <=16 SENSITIVE Sensitive      TRIMETH/SULFA <=20 SENSITIVE Sensitive     AMPICILLIN/SULBACTAM <=2 SENSITIVE Sensitive     PIP/TAZO <=4 SENSITIVE Sensitive     Extended ESBL NEGATIVE Sensitive     * 50,000 COLONIES/mL ESCHERICHIA COLI     PHQ2/9: Depression screen [facility] 2/9 08/23/2019 05/24/2019 05/14/2019 05/03/2019 04/30/2019  Decreased Interest 0 0 0 0 0  Down, Depressed, Hopeless 0 0 0 0 0  PHQ - 2 Score 0 0 0 0 0  Altered sleeping 0 1 0 1 3  Tired, decreased energy 0 0 0 0 0  Change in appetite 0 0 0 0 0  Feeling bad or failure about yourself  0 0 0 0 0  Trouble concentrating 0 0 0 0 0  Moving slowly or fidgety/restless 0 0 0 0 0  Suicidal thoughts 0 0 0 0 0  PHQ-9 Score 0 1 0 1 3  Difficult doing work/chores - Not difficult at all Not difficult at all Not difficult at all Not difficult at all  Some recent data might be hidden    phq 9 is negative  Fall Risk: Fall Risk  08/23/2019 08/23/2019 05/24/2019 05/14/2019 05/03/2019  Falls in the past year? 0 0 0 0 0  Number falls in past yr: 0 0 0 0 0  Injury with Fall?  0 0 0 0 0  Risk for fall due to : - - - - -  Risk for fall due to: Comment - - - - -  Follow up - - - - Falls evaluation completed     Functional Status Survey: Is the patient deaf or have difficulty hearing?: No Does the patient have difficulty seeing, even when wearing glasses/contacts?: No Does the patient have difficulty concentrating, remembering, or making decisions?: No Does the patient have difficulty walking or climbing stairs?: No Does the patient have difficulty dressing or bathing?: No Does the patient have difficulty doing errands alone such as visiting a doctor's office or shopping?: No    Assessment & Plan   1. CKD (chronic kidney disease), stage III ([place])  Avoid oral nsaid's  2. Need for immunization against influenza  - Flu Vaccine QUAD High Dose(Fluad)  3. Other specified hypothyroidism  - levothyroxine (SYNTHROID) 50 MCG tablet; TAKE 1 TABLET BY MOUTH EVERY DAY BEFORE  BREAKFAST AND TAKE 2 TABLETS ON SUNDAY  Dispense: 102 tablet; Refill: 1  4. GAD (generalized anxiety disorder)  - citalopram (CELEXA) 10 MG tablet; Take 1 tablet (10 mg total) by mouth daily.  Dispense: 90 tablet; Refill: 1  5. Dyslipidemia  - simvastatin (ZOCOR) 40 MG tablet; Take 1 tablet (40 mg total) by mouth daily. pm  Dispense: 90 tablet; Refill: 1  6. Vitamin D deficiency   7. Perennial allergic rhinitis with seasonal variation  - levocetirizine (XYZAL) 5 MG tablet; Take 1 tablet (5 mg total) by mouth every evening.  Dispense: 90 tablet; Refill: 1 - montelukast (SINGULAIR) 10 MG tablet; Take 1 tablet (10 mg total) by mouth daily as needed (allergies).  Dispense: 90 tablet; Refill: 1  8. Benign hypertension  - losartan (COZAAR) 50 MG tablet; TAKE 1 TABLET BY MOUTH DAILY  Dispense: 90 tablet; Refill: 1  9. Gastro-esophageal reflux disease without esophagitis  - pantoprazole (PROTONIX) 40 MG tablet; Take 1 tablet (40 mg total) by mouth daily.  Dispense: 90 tablet; Refill: 1  10. Insomnia, persistent  - zolpidem (AMBIEN) 5 MG tablet; Take qhs  Dispense: 90 tablet; Refill: 1  11. Anxiety state  - ALPRAZolam (XANAX) 0.5 MG tablet; Take 1 tablet (0.5 mg total) by mouth daily as needed for anxiety.  Dispense: 20 tablet; Refill: 0  12. Acute left-sided low back pain without sciatica  - diclofenac (FLECTOR) 1.3 % PTCH; Place 1 patch onto the skin 2 (two) times daily.  Dispense: 60 patch; Refill: 2  13. Primary osteoarthritis of both knees   14. Vaginal lesion  - Ambulatory referral to Gynecology

## 2019-08-27 ENCOUNTER — Ambulatory Visit: Payer: PPO

## 2019-08-27 DIAGNOSIS — M9904 Segmental and somatic dysfunction of sacral region: Secondary | ICD-10-CM | POA: Diagnosis not present

## 2019-08-27 DIAGNOSIS — M5137 Other intervertebral disc degeneration, lumbosacral region: Secondary | ICD-10-CM | POA: Diagnosis not present

## 2019-08-29 ENCOUNTER — Ambulatory Visit (INDEPENDENT_AMBULATORY_CARE_PROVIDER_SITE_OTHER): Payer: PPO

## 2019-08-29 VITALS — Ht 65.5 in | Wt 175.0 lb

## 2019-08-29 DIAGNOSIS — Z1231 Encounter for screening mammogram for malignant neoplasm of breast: Secondary | ICD-10-CM | POA: Diagnosis not present

## 2019-08-29 DIAGNOSIS — Z0000 Encounter for general adult medical examination without abnormal findings: Secondary | ICD-10-CM | POA: Diagnosis not present

## 2019-08-29 NOTE — Patient Instructions (Signed)
[name] , Thank you for taking time to come for your Medicare Wellness Visit. I appreciate your ongoing commitment to your health goals. Please review the following plan we discussed and let me know if I can assist you in the future.   Screening recommendations/referrals: Colonoscopy: done 05/29/15. Repeat in 2021.  Mammogram: done 09/10/18. Please call [phone] to schedule your mammogram.  Bone Density: done 09/06/18. Repeat in 2021 Recommended yearly ophthalmology/optometry visit for glaucoma screening and checkup Recommended yearly dental visit for hygiene and checkup  Vaccinations: Influenza vaccine: done 08/23/19 Pneumococcal vaccine: done 03/08/17 Tdap vaccine: done 08/18/10 Shingles vaccine: done 11/20/18    Advanced directives: Please bring a copy of your health care power of attorney and living will to the office at your convenience.  Conditions/risks identified: Recommend increasing physical activity as tolerated.   May try over the counter voltaren gel for pain relief on lower back.   Next appointment: Please follow up in one year for your Medicare Annual Wellness visit.     Preventive Care 60 Years and Older, Female Preventive care refers to lifestyle choices and visits with your health care provider that can promote health and wellness. What does preventive care include?  A yearly physical exam. This is also called an annual well check.  Dental exams once or twice a year.  Routine eye exams. Ask your health care provider how often you should have your eyes checked.  Personal lifestyle choices, including:  Daily care of your teeth and gums.  Regular physical activity.  Eating a healthy diet.  Avoiding tobacco and drug use.  Limiting alcohol use.  Practicing safe sex.  Taking low-dose aspirin every day.  Taking vitamin and mineral supplements as recommended by your health care provider. What happens during an annual well check? The services and  screenings done by your health care provider during your annual well check will depend on your age, overall health, lifestyle risk factors, and family history of disease. Counseling  Your health care provider may ask you questions about your:  Alcohol use.  Tobacco use.  Drug use.  Emotional well-being.  Home and relationship well-being.  Sexual activity.  Eating habits.  History of falls.  Memory and ability to understand (cognition).  Work and work Statistician.  Reproductive health. Screening  You may have the following tests or measurements:  Height, weight, and BMI.  Blood pressure.  Lipid and cholesterol levels. These may be checked every 5 years, or more frequently if you are over 31 years old.  Skin check.  Lung cancer screening. You may have this screening every year starting at age 65 if you have a 30-pack-year history of smoking and currently smoke or have quit within the past 15 years.  Fecal occult blood test (FOBT) of the stool. You may have this test every year starting at age 70.  Flexible sigmoidoscopy or colonoscopy. You may have a sigmoidoscopy every 5 years or a colonoscopy every 10 years starting at age 67.  Hepatitis C blood test.  Hepatitis B blood test.  Sexually transmitted disease (STD) testing.  Diabetes screening. This is done by checking your blood sugar (glucose) after you have not eaten for a while (fasting). You may have this done every 1-3 years.  Bone density scan. This is done to screen for osteoporosis. You may have this done starting at age 50.  Mammogram. This may be done every 1-2 years. Talk to your health care provider about how often you should have regular mammograms.  Talk with your health care provider about your test results, treatment options, and if necessary, the need for more tests. Vaccines  Your health care provider may recommend certain vaccines, such as:  Influenza vaccine. This is recommended every year.   Tetanus, diphtheria, and acellular pertussis (Tdap, Td) vaccine. You may need a Td booster every 10 years.  Zoster vaccine. You may need this after age 76.  Pneumococcal 13-valent conjugate (PCV13) vaccine. One dose is recommended after age 74.  Pneumococcal polysaccharide (PPSV23) vaccine. One dose is recommended after age 41. Talk to your health care provider about which screenings and vaccines you need and how often you need them. This information is not intended to replace advice given to you by your health care provider. Make sure you discuss any questions you have with your health care provider. Document Released: 01/01/2016 Document Revised: 08/24/2016 Document Reviewed: 10/06/2015 Elsevier Interactive Patient Education  2017 [place] Prevention in the Home Falls can cause injuries. They can happen to people of all ages. There are many things you can do to make your home safe and to help prevent falls. What can I do on the outside of my home?  Regularly fix the edges of walkways and driveways and fix any cracks.  Remove anything that might make you trip as you walk through a door, such as a raised step or threshold.  Trim any bushes or trees on the path to your home.  Use bright outdoor lighting.  Clear any walking paths of anything that might make someone trip, such as rocks or tools.  Regularly check to see if handrails are loose or broken. Make sure that both sides of any steps have handrails.  Any raised decks and porches should have guardrails on the edges.  Have any leaves, snow, or ice cleared regularly.  Use sand or salt on walking paths during winter.  Clean up any spills in your garage right away. This includes oil or grease spills. What can I do in the bathroom?  Use night lights.  Install grab bars by the toilet and in the tub and shower. Do not use towel bars as grab bars.  Use non-skid mats or decals in the tub or shower.  If you need to sit  down in the shower, use a plastic, non-slip stool.  Keep the floor dry. Clean up any water that spills on the floor as soon as it happens.  Remove soap buildup in the tub or shower regularly.  Attach bath mats securely with double-sided non-slip rug tape.  Do not have throw rugs and other things on the floor that can make you trip. What can I do in the bedroom?  Use night lights.  Make sure that you have a light by your bed that is easy to reach.  Do not use any sheets or blankets that are too big for your bed. They should not hang down onto the floor.  Have a firm chair that has side arms. You can use this for support while you get dressed.  Do not have throw rugs and other things on the floor that can make you trip. What can I do in the kitchen?  Clean up any spills right away.  Avoid walking on wet floors.  Keep items that you use a lot in easy-to-reach places.  If you need to reach something above you, use a strong step stool that has a grab bar.  Keep electrical cords out of the way.  Do not use floor polish or wax that makes floors slippery. If you must use wax, use non-skid floor wax.  Do not have throw rugs and other things on the floor that can make you trip. What can I do with my stairs?  Do not leave any items on the stairs.  Make sure that there are handrails on both sides of the stairs and use them. Fix handrails that are broken or loose. Make sure that handrails are as long as the stairways.  Check any carpeting to make sure that it is firmly attached to the stairs. Fix any carpet that is loose or worn.  Avoid having throw rugs at the top or bottom of the stairs. If you do have throw rugs, attach them to the floor with carpet tape.  Make sure that you have a light switch at the top of the stairs and the bottom of the stairs. If you do not have them, ask someone to add them for you. What else can I do to help prevent falls?  Wear shoes that:  Do not have  high heels.  Have rubber bottoms.  Are comfortable and fit you well.  Are closed at the toe. Do not wear sandals.  If you use a stepladder:  Make sure that it is fully opened. Do not climb a closed stepladder.  Make sure that both sides of the stepladder are locked into place.  Ask someone to hold it for you, if possible.  Clearly mark and make sure that you can see:  Any grab bars or handrails.  First and last steps.  Where the edge of each step is.  Use tools that help you move around (mobility aids) if they are needed. These include:  Canes.  Walkers.  Scooters.  Crutches.  Turn on the lights when you go into a dark area. Replace any light bulbs as soon as they burn out.  Set up your furniture so you have a clear path. Avoid moving your furniture around.  If any of your floors are uneven, fix them.  If there are any pets around you, be aware of where they are.  Review your medicines with your doctor. Some medicines can make you feel dizzy. This can increase your chance of falling. Ask your doctor what other things that you can do to help prevent falls. This information is not intended to replace advice given to you by your health care provider. Make sure you discuss any questions you have with your health care provider. Document Released: 10/01/2009 Document Revised: 05/12/2016 Document Reviewed: 01/09/2015 Elsevier Interactive Patient Education  2017 [employer].

## 2019-08-29 NOTE — Progress Notes (Signed)
Subjective:   TAHJANAE KINZER is a 73 y.o. female who presents for Medicare Annual (Subsequent) preventive examination.  Virtual Visit via Telephone Note  I connected with [name] on 08/29/19 at 11:00 AM EDT by telephone and verified that I am speaking with the correct person using two identifiers.  Medicare Annual Wellness visit completed telephonically due to Covid-19 pandemic.   Location: Patient: home Provider: office   I discussed the limitations, risks, security and privacy concerns of performing an evaluation and management service by telephone and the availability of in person appointments. The patient expressed understanding and agreed to proceed.  Some vital signs may be absent or patient reported.   Clemetine Marker, LPN    Review of Systems:   Cardiac Risk Factors include: dyslipidemia;hypertension     Objective:     Vitals: Ht 5' 5.5" (1.664 m)    Wt 175 lb (79.4 kg)    BMI 28.68 kg/m   Body mass index is 28.68 kg/m.  Advanced Directives 08/29/2019 05/22/2019 01/26/2019 03/13/2018 09/06/2017 06/09/2017 05/26/2017  Does Patient Have a Medical Advance Directive? Yes Yes Yes Yes Yes Yes No  Type of Paramedic of [place];Living will [place];Out of facility DNR (pink MOST or yellow form) Living will;Healthcare Power of [place];Living will [place];Living will [place];Living will -  Does patient want to make changes to medical advance directive? - - - Yes (MAU/Ambulatory/Procedural Areas - Information given) - - -  Copy of [place] in Chart? No - copy requested No - copy requested - No - copy requested No - copy requested - -  Would patient like information on creating a medical advance directive? - No - Guardian declined - - - - -    Tobacco Social History   Tobacco Use  Smoking Status Never Smoker  Smokeless Tobacco Never Used    Tobacco Comment   smoking cessation materials not required     Counseling given: Not Answered Comment: smoking cessation materials not required   Clinical Intake:  Pre-visit preparation completed: Yes  Pain : 0-10 Pain Score: 2  Pain Type: Acute pain Pain Location: Back Pain Orientation: Right, Lower Pain Descriptors / Indicators: Aching, Discomfort Pain Onset: More than a month ago Pain Frequency: Intermittent     BMI - recorded: 28.68 Nutritional Status: BMI 25 -29 Overweight Nutritional Risks: None Diabetes: No  How often do you need to have someone help you when you read instructions, pamphlets, or other written materials from your doctor or pharmacy?: 1 - Never  Interpreter Needed?: No  Information entered by :: Clemetine Marker LPN  Past Medical History:  Diagnosis Date   Allergy    Anxiety    ER visit in Sept 2017 for anxiety   Arthritis    Chronic insomnia    Colon adenoma    Dyslipidemia    Dysphagia    Female stress incontinence    GERD (gastroesophageal reflux disease)    Glaucoma, both eyes    Dr. Wallace [facility]- [facility]   Hyperlipidemia    Hypertension    controlled on meds   Hypothyroidism    Menopause syndrome    Osteoporosis    Polyp of colon    Renal insufficiency    mild- keeping an eye on   Vitamin D deficiency    Past Surgical History:  Procedure Laterality Date   ABDOMINAL HYSTERECTOMY  21 years ago   APPENDECTOMY     COLONOSCOPY WITH PROPOFOL N/A 05/29/2015   Procedure: COLONOSCOPY WITH PROPOFOL;  Surgeon: Hulen Luster, MD;  Location: [facility] ENDOSCOPY;  Service: Gastroenterology;  Laterality: N/A;   ESOPHAGOGASTRODUODENOSCOPY N/A 05/29/2015   Procedure: ESOPHAGOGASTRODUODENOSCOPY (EGD);  Surgeon: Hulen Luster, MD;  Location: [facility] ENDOSCOPY;  Service: Gastroenterology;  Laterality: N/A;   EYE SURGERY     eye lids   OVARIAN CYST REMOVAL     needed transfusion   PHOTOCOAGULATION WITH LASER Right 11/21/2016    Procedure: PHOTOCOAGULATION WITH LASER;  Surgeon: Ronnell Freshwater, MD;  Location: [place];  Service: Ophthalmology;  Laterality: Right;  A micropulse probe was applied to each hemilimbus with the following settings: 2034mW, 31.3% duty cycle, 90 seconds x 3 applications.    Family History  Problem Relation Age of Onset   Hypertension Mother    Osteoporosis Mother    COPD Mother    Dementia Mother    Cancer Father        Colon   Hypothyroidism Daughter    Aneurysm Brother    COPD Sister    Social History   Socioeconomic History   Marital status: Widowed    Spouse name: Shanon Brow   Number of children: 1   Years of education: Not on file   Highest education level: 12th grade  Occupational History   Occupation: Retired  Scientist, product/process development strain: Not hard at [employer] insecurity    Worry: Never true    Inability: Never true   Transportation needs    Medical: No    Non-medical: No  Tobacco Use   Smoking status: Never Smoker   Smokeless tobacco: Never Used   Tobacco comment: smoking cessation materials not required  Substance and Sexual Activity   Alcohol use: No    Alcohol/week: 0.0 standard drinks   Drug use: No   Sexual activity: Not Currently    Partners: Male  Lifestyle   Physical activity    Days per week: 0 days    Minutes per session: 0 min   Stress: Only a little  Relationships   Social connections    Talks on phone: Patient refused    Gets together: Patient refused    Attends religious service: Patient refused    Active member of club or organization: Patient refused    Attends meetings of clubs or organizations: Patient refused    Relationship status: Widowed  Other Topics Concern   Not on file  Social History Narrative   Widow since 2013   Started to date in 2019    Outpatient Encounter Medications as of 08/29/2019  Medication Sig   ALPRAZolam (XANAX) 0.5 MG tablet Take 1 tablet (0.5 mg  total) by mouth daily as needed for anxiety. (Patient taking differently: Take 0.5 mg by mouth daily as needed for anxiety. Rarely)   aspirin 81 MG tablet Take 81 mg by mouth daily. pm   cholecalciferol (VITAMIN D) 1000 units tablet Take 1,000 Units by mouth 2 (two) times daily. am   citalopram (CELEXA) 10 MG tablet Take 1 tablet (10 mg total) by mouth daily.   denosumab (PROLIA) 60 MG/ML SOSY injection Inject into the skin.   fluticasone (FLONASE) 50 MCG/ACT nasal spray Place 2 sprays into both nostrils daily. Pm   INTRAROSA 6.5 MG INST I 1 SUP VAG HS   levocetirizine (XYZAL) 5 MG tablet Take 1 tablet (5 mg total) by mouth every  evening.   levothyroxine (SYNTHROID) 50 MCG tablet TAKE 1 TABLET BY MOUTH EVERY DAY BEFORE BREAKFAST AND TAKE 2 TABLETS ON SUNDAY   losartan (COZAAR) 50 MG tablet TAKE 1 TABLET BY MOUTH DAILY   Melatonin 3 MG CAPS Take 1 capsule (3 mg total) by mouth daily.   montelukast (SINGULAIR) 10 MG tablet Take 1 tablet (10 mg total) by mouth daily as needed (allergies).   Omega-3 Fatty Acids (FISH OIL PO) Take by mouth. AM   pantoprazole (PROTONIX) 40 MG tablet Take 1 tablet (40 mg total) by mouth daily.   simvastatin (ZOCOR) 40 MG tablet Take 1 tablet (40 mg total) by mouth daily. pm   timolol (TIMOPTIC) 0.5 % ophthalmic solution INT 1 GTT IN OU BID   VYZULTA 0.024 % SOLN INT 1 GTT INTO OU AT NIGHT   zolpidem (AMBIEN) 5 MG tablet Take qhs   diclofenac (FLECTOR) 1.3 % PTCH Place 1 patch onto the skin 2 (two) times daily. (Patient not taking: Reported on 08/29/2019)   Facility-Administered Encounter Medications as of 08/29/2019  Medication   cyanocobalamin ((VITAMIN B-12)) injection 1,000 mcg    Activities of Daily Living In your present state of health, do you have any difficulty performing the following activities: 08/29/2019 08/23/2019  Hearing? N N  Comment declines hearing aids -  Vision? N N  Comment - -  Difficulty concentrating or making decisions?  N N  Walking or climbing stairs? N N  Dressing or bathing? N N  Doing errands, shopping? N N  Preparing Food and eating ? N -  Using the Toilet? N -  In the past six months, have you accidently leaked urine? Y -  Comment wears pads for protection -  Do you have problems with loss of bowel control? N -  Managing your Medications? N -  Managing your Finances? N -  Housekeeping or managing your Housekeeping? N -  Some recent data might be hidden    Patient Care Team: Steele Sizer, MD as PCP - General (Family Medicine)    Assessment:   This is a routine wellness examination for [place].  Exercise Activities and Dietary recommendations Current Exercise Habits: The patient does not participate in regular exercise at present, Exercise limited by: orthopedic condition(s)  Goals     DIET - INCREASE WATER INTAKE     Recommend to drink at least 6-8 8oz glasses of water per day.       Fall Risk Fall Risk  08/29/2019 08/23/2019 08/23/2019 05/24/2019 05/14/2019  Falls in the past year? 0 0 0 0 0  Number falls in past yr: 0 0 0 0 0  Injury with Fall? 0 0 0 0 0  Risk for fall due to : - - - - -  Risk for fall due to: Comment - - - - -  Follow up Falls prevention discussed - - - -   FALL RISK PREVENTION PERTAINING TO THE HOME:  Any stairs in or around the home? Yes  If so, do they handrails? Yes   Home free of loose throw rugs in walkways, pet beds, electrical cords, etc? Yes  Adequate lighting in your home to reduce risk of falls? Yes   ASSISTIVE DEVICES UTILIZED TO PREVENT FALLS:  Life alert? No  Use of a cane, walker or w/c? No  Grab bars in the bathroom? No  Shower chair or bench in shower? Yes  Elevated toilet seat or a handicapped toilet? No   DME ORDERS:  DME  order needed?  No   TIMED UP AND GO:  Was the test performed? No . Telephonic visit  Education: Fall risk prevention has been discussed.  Intervention(s) required? No    Depression Screen PHQ 2/9 Scores  08/29/2019 08/23/2019 05/24/2019 05/14/2019  PHQ - 2 Score 0 0 0 0  PHQ- 9 Score - 0 1 0     Cognitive Function     6CIT Screen 08/29/2019 03/13/2018  What Year? 0 points 0 points  What month? 0 points 0 points  What time? 0 points 0 points  Count back from 20 0 points 0 points  Months in reverse 0 points 0 points  Repeat phrase 0 points 0 points  Total Score 0 0    Immunization History  Administered Date(s) Administered   Fluad Quad(high Dose 65+) 08/23/2019   Influenza Split 10/02/2008, 08/18/2010, 09/20/2012   Influenza, High Dose Seasonal PF 08/25/2015, 09/07/2016, 09/06/2017, 09/11/2018   Influenza, Seasonal, Injecte, Preservative Fre 10/09/2014   Influenza-Unspecified 10/09/2014   Pneumococcal Conjugate-13 08/18/2010, 03/08/2017   Pneumococcal Polysaccharide-23 12/30/2013   Pneumococcal-Unspecified 08/18/2010   Td 08/18/2010   Tdap 08/18/2010   Zoster 07/24/2012   Zoster Recombinat (Shingrix) 07/12/2018, 11/20/2018    Qualifies for Shingles Vaccine? Shingrix series completed.   Tdap: Up to date  Flu Vaccine: Up to date  Pneumococcal Vaccine: Up to date   Screening Tests Health Maintenance  Topic Date Due   MAMMOGRAM  09/11/2019   COLONOSCOPY  05/28/2020   TETANUS/TDAP  08/18/2020   INFLUENZA VACCINE  Completed   DEXA SCAN  Completed   Hepatitis C Screening  Completed   PNA vac Low Risk Adult  Completed    Cancer Screenings:  Colorectal Screening: Completed 05/29/15. Repeat every 5 years.  Mammogram: Completed 09/10/18. Repeat every year. Ordered today. Pt provided with contact information and advised to call to schedule appt.   Bone Density: Completed 09/06/18. Results reflect OSTEOPOROSIS. Repeat every 2 years.   Lung Cancer Screening: (Low Dose CT Chest recommended if Age 71-80 years, 30 pack-year currently smoking OR have quit w/in 15years.) does not qualify.    Additional Screening:  Hepatitis C Screening: does qualify; Completed  02/14/13  Vision Screening: Recommended annual ophthalmology exams for early detection of glaucoma and other disorders of the eye. Is the patient up to date with their annual eye exam?  Yes  Who is the provider or what is the name of the office in which the pt attends annual eye exams? [place] Screening: Recommended annual dental exams for proper oral hygiene  Community Resource Referral:  CRR required this visit?  No      Plan:     I have personally reviewed and addressed the Medicare Annual Wellness questionnaire and have noted the following in the patients chart:  A. Medical and social history B. Use of alcohol, tobacco or illicit drugs  C. Current medications and supplements D. Functional ability and status E.  Nutritional status F.  Physical activity G. Advance directives H. List of other physicians I.  Hospitalizations, surgeries, and ER visits in previous 12 months J.  Ste[place] Falls such as hearing and vision if needed, cognitive and depression L. Referrals and appointments   In addition, I have reviewed and discussed with patient certain preventive protocols, quality metrics, and best practice recommendations. A written personalized care plan for preventive services as well as general preventive health recommendations were provided to patient.   Signed,  Clemetine Marker, LPN Nurse Health  Advisor   Nurse Notes: pt states she was given rx for Flector patch for sacroiliac joint pain; however, after prior authorization patient was told she would have a $126 copay. She tried an aspercreme patch as an alternative with some relief and has also started seeing a Restaurant manager, fast food. Pt advised may try voltaren gel OTC or ask pharmacist for additional recommendations. Pt would like to know if there are any other prescriptions or suggestions from Dr. Ancil Boozer. She is also doing light exercises as instructed to help with joint pain. Thank you.

## 2019-08-30 DIAGNOSIS — M5137 Other intervertebral disc degeneration, lumbosacral region: Secondary | ICD-10-CM | POA: Diagnosis not present

## 2019-08-30 DIAGNOSIS — M9904 Segmental and somatic dysfunction of sacral region: Secondary | ICD-10-CM | POA: Diagnosis not present

## 2019-09-11 ENCOUNTER — Ambulatory Visit (INDEPENDENT_AMBULATORY_CARE_PROVIDER_SITE_OTHER): Payer: PPO | Admitting: Obstetrics and Gynecology

## 2019-09-11 ENCOUNTER — Encounter: Payer: Self-pay | Admitting: Obstetrics and Gynecology

## 2019-09-11 ENCOUNTER — Other Ambulatory Visit: Payer: Self-pay

## 2019-09-11 VITALS — BP 158/82 | HR 99 | Ht 66.0 in | Wt 179.0 lb

## 2019-09-11 DIAGNOSIS — L280 Lichen simplex chronicus: Secondary | ICD-10-CM

## 2019-09-11 MED ORDER — CLOBETASOL PROPIONATE 0.05 % EX OINT: TOPICAL_OINTMENT | CUTANEOUS | 5 refills | Status: DC

## 2019-09-11 NOTE — Patient Instructions (Signed)
Lichen Sclerosus Lichen sclerosus is a skin problem. It can happen on any part of the body, but it commonly involves the anal or genital areas. It can cause itching and discomfort in these areas. Treatment can help to control symptoms. When the genital area is affected, getting treatment is important because the condition can cause scarring that may lead to other problems. What are the causes? The cause of this condition is not known. It may be related to an overactive immune system or a lack of certain hormones. Lichen sclerosus is not an infection or a fungus, and it is not passed from one person to another (not contagious). What increases the risk? This condition is more likely to develop in women, usually after menopause. What are the signs or symptoms? Symptoms of this condition include:  Thin, wrinkled, white areas on the skin.  Thickened white areas on the skin.  Red and swollen patches (lesions) on the skin.  Tears or cracks in the skin.  Bruising.  Blood blisters.  Severe itching.  Pain, itching, or burning when urinating. Constipation is also common in people with lichen sclerosus. How is this diagnosed? This condition may be diagnosed with a physical exam. In some cases, a tissue sample (biopsy sample) may be removed to be looked at under a microscope. How is this treated? This condition is usually treated with medicated creams or ointments (topical steroids) that are applied over the affected areas. In some cases, treatment may also include medicines that are taken by mouth. Surgery may be needed in more severe cases that are causing problems such as scarring. Follow these instructions at home:  Take or use over-the-counter and prescription medicines only as told by your health care provider.  Use creams or ointments as told by your health care provider.  Do not scratch the affected areas of skin.  If you are a woman, be sure to keep the vaginal area as clean and dry  as possible.  Clean the affected area of skin gently with water. Avoid using rough towels or toilet paper.  Keep all follow-up visits as told by your health care provider. This is important. Contact a health care provider if:  You have increasing redness, swelling, or pain in the affected area.  You have fluid, blood, or pus coming from the affected area.  You have new lesions on your skin.  You have a fever.  You have pain during sex. Summary  Lichen sclerosus is a skin problem. When the genital area is affected, getting treatment is important because the condition can cause scarring that may lead to other problems.  This condition is usually treated with medicated creams or ointments (topical steroids) that are applied over the affected areas.  Take or use over-the-counter and prescription medicines only as told by your health care provider.  Contact a health care provider if you have new lesions on your skin, have pain during sex, or have increasing redness, swelling, or pain in the affected area.  Keep all follow-up visits as told by your health care provider. This is important. This information is not intended to replace advice given to you by your health care provider. Make sure you discuss any questions you have with your health care provider. Document Released: 04/27/2011 Document Revised: 04/19/2018 Document Reviewed: 04/19/2018 Elsevier Patient Education  2020 Elsevier Inc.  

## 2019-09-11 NOTE — Progress Notes (Signed)
Patient ID: Dawn Fuentes, female   DOB: 10-Dec-1946, 73 y.o.   MRN: [id number]  Reason for Consult: Referral (Vaginal redness and itching, and it is painful. On and off for 6-9 months )   Referred by Steele Sizer, MD  Subjective:     HPI:  Dawn Fuentes is a 73 y.o. female. She reports that she has had extreme vaginal itching and redness for 6-9 months. She has tried over the [employer] which has not given her relief.    Past Medical History:  Diagnosis Date  . Allergy   . Anxiety    ER visit in Sept 2017 for anxiety  . Arthritis   . Chronic insomnia   . Colon adenoma   . Dyslipidemia   . Dysphagia   . Female stress incontinence   . GERD (gastroesophageal reflux disease)   . [facility], both eyes    [facility]  . Hyperlipidemia   . Hypertension    controlled on meds  . Hypothyroidism   . Menopause syndrome   . Osteoporosis   . Polyp of colon   . Renal insufficiency    mild- keeping an eye on  . Vitamin D deficiency    Family History  Problem Relation Age of Onset  . Hypertension Mother   . Osteoporosis Mother   . COPD Mother   . Dementia Mother   . Cancer Father        Colon  . Hypothyroidism Daughter   . Aneurysm Brother   . COPD Sister    Past Surgical History:  Procedure Laterality Date  . ABDOMINAL HYSTERECTOMY     21 years ago  . APPENDECTOMY    . COLONOSCOPY WITH PROPOFOL N/A 05/29/2015   Procedure: COLONOSCOPY WITH PROPOFOL;  Surgeon: Hulen Luster, MD;  Location: [facility] ENDOSCOPY;  Service: Gastroenterology;  Laterality: N/A;  . ESOPHAGOGASTRODUODENOSCOPY N/A 05/29/2015   Procedure: ESOPHAGOGASTRODUODENOSCOPY (EGD);  Surgeon: Hulen Luster, MD;  Location: [facility] ENDOSCOPY;  Service: Gastroenterology;  Laterality: N/A;  . EYE SURGERY     eye lids  . OVARIAN CYST REMOVAL     needed transfusion  . PHOTOCOAGULATION WITH LASER Right 11/21/2016   Procedure: PHOTOCOAGULATION WITH LASER;  Surgeon: Ronnell Freshwater, MD;   Location: [place];  Service: Ophthalmology;  Laterality: Right;  A micropulse probe was applied to each hemilimbus with the following settings: 2052mW, 31.3% duty cycle, 90 seconds x 3 applications.     Short Social History:  Social History   Tobacco Use  . Smoking status: Never Smoker  . Smokeless tobacco: Never Used  . Tobacco comment: smoking cessation materials not required  Substance Use Topics  . Alcohol use: No    Alcohol/week: 0.0 standard drinks    Allergies  Allergen Reactions  . Brimonidine Tartrate   . Latanoprost   . Raloxifene     bone pain  . Sulfa Antibiotics Nausea Only    Intolerance rather than allergy.    Current Outpatient Medications  Medication Sig Dispense Refill  . ALPRAZolam (XANAX) 0.5 MG tablet Take 1 tablet (0.5 mg total) by mouth daily as needed for anxiety. (Patient taking differently: Take 0.5 mg by mouth daily as needed for anxiety. Rarely) 20 tablet 0  . aspirin 81 MG tablet Take 81 mg by mouth daily. pm    . cholecalciferol (VITAMIN D) 1000 units tablet Take 1,000 Units by mouth 2 (two) times daily. am    . citalopram (CELEXA) 10 MG  tablet Take 1 tablet (10 mg total) by mouth daily. 90 tablet 1  . denosumab (PROLIA) 60 MG/ML SOSY injection Inject into the skin.    . fluticasone (FLONASE) 50 MCG/ACT nasal spray Place 2 sprays into both nostrils daily. Pm 16 g 2  . INTRAROSA 6.5 MG INST I 1 SUP VAG HS    . levocetirizine (XYZAL) 5 MG tablet Take 1 tablet (5 mg total) by mouth every evening. 90 tablet 1  . levothyroxine (SYNTHROID) 50 MCG tablet TAKE 1 TABLET BY MOUTH EVERY DAY BEFORE BREAKFAST AND TAKE 2 TABLETS ON SUNDAY 102 tablet 1  . losartan (COZAAR) 50 MG tablet TAKE 1 TABLET BY MOUTH DAILY 90 tablet 1  . Melatonin 3 MG CAPS Take 1 capsule (3 mg total) by mouth daily. 90 capsule 0  . montelukast (SINGULAIR) 10 MG tablet Take 1 tablet (10 mg total) by mouth daily as needed (allergies). 90 tablet 1  . Omega-3 Fatty Acids (FISH  OIL PO) Take by mouth. AM    . pantoprazole (PROTONIX) 40 MG tablet Take 1 tablet (40 mg total) by mouth daily. 90 tablet 1  . simvastatin (ZOCOR) 40 MG tablet Take 1 tablet (40 mg total) by mouth daily. pm 90 tablet 1  . timolol (TIMOPTIC) 0.5 % ophthalmic solution INT 1 GTT IN OU BID  5  . VYZULTA 0.024 % SOLN INT 1 GTT INTO OU AT NIGHT  2  . zolpidem (AMBIEN) 5 MG tablet Take qhs 90 tablet 1   Current Facility-Administered Medications  Medication Dose Route Frequency Provider Last Rate Last Dose  . cyanocobalamin ((VITAMIN B-12)) injection 1,000 mcg  1,000 mcg Intramuscular Q30 days Sowles, Krichna, MD   1,000 mcg at 08/23/19 1146    REVIEW OF SYSTEMS      Objective:  Objective   Vitals:   09/11/19 1329  BP: (!) 158/82  Pulse: 99  Weight: 179 lb (81.2 kg)  Height: 5\' 6" (1.676 m)   Body mass index is 28.89 kg/m.  Physical Exam Vitals signs and nursing note reviewed.  Constitutional:      Appearance: She is well-developed.  HENT:     Head: Normocephalic and atraumatic.  Eyes:     Pupils: Pupils are equal, round, and reactive to light.  Cardiovascular:     Rate and Rhythm: Normal rate and regular rhythm.  Pulmonary:     Effort: Pulmonary effort is normal. No respiratory distress.  Genitourinary:      Comments: Atrophic narrow vaginal.  Thin white skin Three Small pinpoint blood blisters Skin:    General: Skin is warm and dry.  Neurological:     Mental Status: She is alert and oriented to person, place, and time.  Psychiatric:        Behavior: Behavior normal.        Thought Content: Thought content normal.        Judgment: Judgment normal.        Assessment/Plan:     72  yo with lichen simplex  Will treat with clobetasol ointment. Discussed soak and seal regimen as well.  Follow up in 8-12 weeks Discussed stress incontinence treatment briefly, would need evaluation for this as well.   More than 20 minutes were spent face to face with the patient in the  room with more than 50% of the time spent providing counseling and discussing the plan of management.   Adrian Prows MD [facility] 09/11/2019 1:55 PM

## 2019-10-01 ENCOUNTER — Ambulatory Visit: Payer: PPO | Admitting: Podiatry

## 2019-10-01 ENCOUNTER — Other Ambulatory Visit: Payer: Self-pay | Admitting: Podiatry

## 2019-10-01 ENCOUNTER — Other Ambulatory Visit: Payer: Self-pay

## 2019-10-01 ENCOUNTER — Ambulatory Visit (INDEPENDENT_AMBULATORY_CARE_PROVIDER_SITE_OTHER): Payer: PPO

## 2019-10-01 ENCOUNTER — Encounter: Payer: Self-pay | Admitting: Podiatry

## 2019-10-01 DIAGNOSIS — M778 Other enthesopathies, not elsewhere classified: Secondary | ICD-10-CM

## 2019-10-01 DIAGNOSIS — M84375A Stress fracture, left foot, initial encounter for fracture: Secondary | ICD-10-CM

## 2019-10-01 DIAGNOSIS — S92344D Nondisplaced fracture of fourth metatarsal bone, right foot, subsequent encounter for fracture with routine healing: Secondary | ICD-10-CM

## 2019-10-02 ENCOUNTER — Encounter: Payer: Self-pay | Admitting: Podiatry

## 2019-10-02 NOTE — Progress Notes (Signed)
She presents today with a chief complaint of pain to the dorsal aspect of her left foot is slightly red and swollen times the past 3 weeks no injury that she can recall.  Objective: She has pain on palpation to the third metatarsal of the left foot.  Pulses are strong and palpable.  Radiographs taken today demonstrates what appears to be a fracture stress fracture of the third metatarsal of the left foot nondisplaced non-comminuted.  Assessment: Stress fracture third metatarsal left.  Plan: Put her in her Darco shoe and will follow-up with her in 4 weeks for set of x-rays.

## 2019-10-04 DIAGNOSIS — M810 Age-related osteoporosis without current pathological fracture: Secondary | ICD-10-CM | POA: Diagnosis not present

## 2019-10-15 DIAGNOSIS — H402213 Chronic angle-closure glaucoma, right eye, severe stage: Secondary | ICD-10-CM | POA: Diagnosis not present

## 2019-10-16 DIAGNOSIS — Z1231 Encounter for screening mammogram for malignant neoplasm of breast: Secondary | ICD-10-CM | POA: Diagnosis not present

## 2019-11-03 ENCOUNTER — Other Ambulatory Visit: Payer: Self-pay | Admitting: Family Medicine

## 2019-11-03 DIAGNOSIS — E785 Hyperlipidemia, unspecified: Secondary | ICD-10-CM

## 2019-11-04 ENCOUNTER — Encounter: Payer: Self-pay | Admitting: Obstetrics and Gynecology

## 2019-11-04 ENCOUNTER — Other Ambulatory Visit: Payer: Self-pay

## 2019-11-04 ENCOUNTER — Ambulatory Visit (INDEPENDENT_AMBULATORY_CARE_PROVIDER_SITE_OTHER): Payer: PPO | Admitting: Obstetrics and Gynecology

## 2019-11-04 VITALS — BP 140/82 | Ht 66.0 in | Wt 181.0 lb

## 2019-11-04 DIAGNOSIS — N904 Leukoplakia of vulva: Secondary | ICD-10-CM | POA: Insufficient documentation

## 2019-11-04 NOTE — Progress Notes (Signed)
Patient ID: TAKAKO ASP, female   DOB: [birth year], 73 y.o.   MRN: [id number]  Reason for Consult: Follow-up (Is better, last couple of days has started itching again )   Referred by Steele Sizer, MD  Subjective:     HPI:  Dawn Fuentes is a 73 y.o. female. She reports significant improvement in her vulvar symptoms  With daily usage of the topical steroid. It was almost an instantaneous improvement. She has not yet been able to tolerate every other day dosage. This caused a reoccurrence of symptoms.   Past Medical History:  Diagnosis Date  . Allergy   . Anxiety    ER visit in Sept 2017 for anxiety  . Arthritis   . Chronic insomnia   . Colon adenoma   . Dyslipidemia   . Dysphagia   . Female stress incontinence   . GERD (gastroesophageal reflux disease)   . Glaucoma, both eyes    [facility]  . Hyperlipidemia   . Hypertension    controlled on meds  . Hypothyroidism   . Menopause syndrome   . Osteoporosis   . Polyp of colon   . Renal insufficiency    mild- keeping an eye on  . Vitamin D deficiency    Family History  Problem Relation Age of Onset  . Hypertension Mother   . Osteoporosis Mother   . COPD Mother   . Dementia Mother   . Cancer Father        Colon  . Hypothyroidism Daughter   . Aneurysm Brother   . COPD Sister    Past Surgical History:  Procedure Laterality Date  . ABDOMINAL HYSTERECTOMY     21 years ago  . APPENDECTOMY    . COLONOSCOPY WITH PROPOFOL N/A 05/29/2015   Procedure: COLONOSCOPY WITH PROPOFOL;  Surgeon: Hulen Luster, MD;  Location: [facility] ENDOSCOPY;  Service: Gastroenterology;  Laterality: N/A;  . ESOPHAGOGASTRODUODENOSCOPY N/A 05/29/2015   Procedure: ESOPHAGOGASTRODUODENOSCOPY (EGD);  Surgeon: Hulen Luster, MD;  Location[facility] ENDOSCOPY;  Service: Gastroenterology;  Laterality: N/A;  . EYE SURGERY     eye lids  . OVARIAN CYST REMOVAL     needed transfusion  . PHOTOCOAGULATION WITH LASER Right 11/21/2016   Procedure: PHOTOCOAGULATION WITH LASER;  Surgeon: Ronnell Freshwater, MD;  Location: [place];  Service: Ophthalmology;  Laterality: Right;  A micropulse probe was applied to each hemilimbus with the following settings: 2086mW, 31.3% duty cycle, 90 seconds x 3 applications.     Short Social History:  Social History   Tobacco Use  . Smoking status: Never Smoker  . Smokeless tobacco: Never Used  . Tobacco comment: smoking cessation materials not required  Substance Use Topics  . Alcohol use: No    Alcohol/week: 0.0 standard drinks    Allergies  Allergen Reactions  . Brimonidine Tartrate   . Latanoprost   . Raloxifene     bone pain  . Sulfa Antibiotics Nausea Only    Intolerance rather than allergy.    Current Outpatient Medications  Medication Sig Dispense Refill  . ALPRAZolam (XANAX) 0.5 MG tablet Take 1 tablet (0.5 mg total) by mouth daily as needed for anxiety. (Patient taking differently: Take 0.5 mg by mouth daily as needed for anxiety. Rarely) 20 tablet 0  . aspirin 81 MG tablet Take 81 mg by mouth daily. pm    . cholecalciferol (VITAMIN D) 1000 units tablet Take 1,000 Units by mouth 2 (two) times daily. am    .  citalopram (CELEXA) 10 MG tablet Take 1 tablet (10 mg total) by mouth daily. 90 tablet 1  . clobetasol ointment (TEMOVATE) 0.05 % Apply to affected area every night for 4 weeks, then every other day for 4 weeks and then twice a week for 4 weeks or until resolution. 30 g 5  . denosumab (PROLIA) 60 MG/ML SOSY injection Inject into the skin.    . fluticasone (FLONASE) 50 MCG/ACT nasal spray Place 2 sprays into both nostrils daily. Pm 16 g 2  . INTRAROSA 6.5 MG INST I 1 SUP VAG HS    . levocetirizine (XYZAL) 5 MG tablet Take 1 tablet (5 mg total) by mouth every evening. 90 tablet 1  . levothyroxine (SYNTHROID) 50 MCG tablet TAKE 1 TABLET BY MOUTH EVERY DAY BEFORE BREAKFAST AND TAKE 2 TABLETS ON SUNDAY 102 tablet 1  . losartan (COZAAR) 50 MG tablet TAKE 1  TABLET BY MOUTH DAILY 90 tablet 1  . Melatonin 3 MG CAPS Take 1 capsule (3 mg total) by mouth daily. 90 capsule 0  . montelukast (SINGULAIR) 10 MG tablet Take 1 tablet (10 mg total) by mouth daily as needed (allergies). 90 tablet 1  . Omega-3 Fatty Acids (FISH OIL PO) Take by mouth. AM    . pantoprazole (PROTONIX) 40 MG tablet Take 1 tablet (40 mg total) by mouth daily. 90 tablet 1  . simvastatin (ZOCOR) 40 MG tablet TAKE 1 TABLET(40 MG) BY MOUTH DAILY IN THE EVENING 90 tablet 1  . timolol (TIMOPTIC) 0.5 % ophthalmic solution INT 1 GTT IN OU BID  5  . VYZULTA 0.024 % SOLN INT 1 GTT INTO OU AT NIGHT  2  . zolpidem (AMBIEN) 5 MG tablet Take qhs 90 tablet 1   Current Facility-Administered Medications  Medication Dose Route Frequency Provider Last Rate Last Dose  . cyanocobalamin ((VITAMIN B-12)) injection 1,000 mcg  1,000 mcg Intramuscular Q30 days Steele Sizer, MD   1,000 mcg at 08/23/19 1146    Review of Systems  Constitutional: Negative for chills, fatigue, fever and unexpected weight change.  HENT: Negative for trouble swallowing.  Eyes: Negative for loss of vision.  Respiratory: Negative for cough, shortness of breath and wheezing.  Cardiovascular: Negative for chest pain, leg swelling, palpitations and syncope.  GI: Negative for abdominal pain, blood in stool, diarrhea, nausea and vomiting.  GU: Negative for difficulty urinating, dysuria, frequency and hematuria.  Musculoskeletal: Negative for back pain, leg pain and joint pain.  Skin: Negative for rash.  Neurological: Negative for dizziness, headaches, light-headedness, numbness and seizures.  Psychiatric: Negative for behavioral problem, confusion, depressed mood and sleep disturbance.        Objective:  Objective   Vitals:   11/04/19 1330  BP: 140/82  Weight: 181 lb (82.1 kg)  Height: 5\' 6"  (1.676 m)   Body mass index is 29.21 kg/m.  Physical Exam Vitals signs and nursing note reviewed.  Constitutional:       Appearance: She is well-developed.  HENT:     Head: Normocephalic and atraumatic.  Eyes:     Pupils: Pupils are equal, round, and reactive to light.  Cardiovascular:     Rate and Rhythm: Normal rate and regular rhythm.  Pulmonary:     Effort: Pulmonary effort is normal. No respiratory distress.  Genitourinary:    Comments: Small pinpoint red papules Labial loss  Thin hypopigmented skin Clitoral scarring  Skin:    General: Skin is warm and dry.  Neurological:     Mental Status:  She is alert and oriented to person, place, and time.  Psychiatric:        Behavior: Behavior normal.        Thought Content: Thought content normal.        Judgment: Judgment normal.        Assessment/Plan:     73 yo  Lichen Sclerosis of the vulva Symptoms greatly improved with clobetasol Continue with q day or every other day application of topical steroid.  Yearly follow up to monitor the vulva  More than 15 minutes were spent face to face with the patient in the room with more than 50% of the time spent providing counseling and discussing the plan of management.   Adrian Prows MD [facility] 11/04/2019 1:47 PM

## 2019-11-06 ENCOUNTER — Other Ambulatory Visit: Payer: Self-pay | Admitting: Podiatry

## 2019-11-06 ENCOUNTER — Ambulatory Visit (INDEPENDENT_AMBULATORY_CARE_PROVIDER_SITE_OTHER): Payer: PPO

## 2019-11-06 ENCOUNTER — Other Ambulatory Visit: Payer: Self-pay

## 2019-11-06 ENCOUNTER — Ambulatory Visit: Payer: PPO | Admitting: Podiatry

## 2019-11-06 DIAGNOSIS — M84375D Stress fracture, left foot, subsequent encounter for fracture with routine healing: Secondary | ICD-10-CM

## 2019-11-06 DIAGNOSIS — M84375A Stress fracture, left foot, initial encounter for fracture: Secondary | ICD-10-CM

## 2019-11-06 NOTE — Progress Notes (Signed)
She presents today for follow-up of her fracture third metatarsal left foot.  States that it feels a lot better.  Objective: Vital signs are stable she alert and oriented x3.  Mild erythema mild edema overlying the distal aspect of the third metatarsal of the left foot.  Radiographs taken today demonstrate a bone callus forming.  Nondisplaced noncomminuted no through and through fracture.  Assessment: Well-healing third metatarsal distal diaphyseal fracture.  Plan: Continue the use of the Darco shoe for least another week or so that will place her in about 8 weeks since the time of injury I will follow-up with her in a month if necessary.

## 2019-11-22 ENCOUNTER — Other Ambulatory Visit: Payer: Self-pay

## 2019-11-22 ENCOUNTER — Ambulatory Visit (INDEPENDENT_AMBULATORY_CARE_PROVIDER_SITE_OTHER): Payer: PPO

## 2019-11-22 DIAGNOSIS — E538 Deficiency of other specified B group vitamins: Secondary | ICD-10-CM

## 2019-11-22 NOTE — Progress Notes (Signed)
Patient came in for her B-12 injection. She tolerated it well. NKDA.   

## 2019-11-29 ENCOUNTER — Ambulatory Visit: Payer: PPO | Admitting: Podiatry

## 2019-11-29 ENCOUNTER — Other Ambulatory Visit: Payer: Self-pay

## 2019-11-29 ENCOUNTER — Other Ambulatory Visit: Payer: Self-pay | Admitting: Podiatry

## 2019-11-29 ENCOUNTER — Ambulatory Visit (INDEPENDENT_AMBULATORY_CARE_PROVIDER_SITE_OTHER): Payer: PPO

## 2019-11-29 DIAGNOSIS — S92344D Nondisplaced fracture of fourth metatarsal bone, right foot, subsequent encounter for fracture with routine healing: Secondary | ICD-10-CM

## 2019-11-29 DIAGNOSIS — M79671 Pain in right foot: Secondary | ICD-10-CM | POA: Diagnosis not present

## 2019-11-29 MED ORDER — MELOXICAM 15 MG PO TABS: 15.0000 mg | ORAL_TABLET | Freq: Every day | ORAL | 1 refills | Status: DC

## 2019-12-03 NOTE — Progress Notes (Signed)
   HPI: 73 y.o. female presenting today for follow up evaluation of a fractured fourth metatarsal of the right foot. She reports pain to the dorsum of the foot with associated swelling. Applying pressure to the foot increases the pain. She has been using ace wraps and taking OTC Tylenol for treatment. Patient is here for further evaluation and treatment.   Past Medical History:  Diagnosis Date  . Allergy   . Anxiety    ER visit in Sept 2017 for anxiety  . Arthritis   . Chronic insomnia   . Colon adenoma   . Dyslipidemia   . Dysphagia   . Female stress incontinence   . GERD (gastroesophageal reflux disease)   . Glaucoma, both eyes    [facility]  . Hyperlipidemia   . Hypertension    controlled on meds  . Hypothyroidism   . Menopause syndrome   . Osteoporosis   . Polyp of colon   . Renal insufficiency    mild- keeping an eye on  . Vitamin D deficiency      Physical Exam: General: The patient is alert and oriented x3 in no acute distress.  Dermatology: Skin is warm, dry and supple bilateral lower extremities. Negative for open lesions or macerations.  Vascular: Palpable pedal pulses bilaterally. No edema or erythema noted. Capillary refill within normal limits.  Neurological: Epicritic and protective threshold grossly intact bilaterally.   Musculoskeletal Exam: Pain with palpation noted to the fourth metatarsal of the right foot. Range of motion within normal limits to all pedal and ankle joints bilateral. Muscle strength 5/5 in all groups bilateral.   Radiographic Exam:  Fracture of the fourth metatarsal of the right foot with routine healing.    Assessment: 1. Fracture fourth metatarsal right with routine healing    Plan of Care:  1. Patient evaluated. X-Rays reviewed.  2. Resume using CAM boot.  3. Prescription for Meloxicam provided to patient. 4. Continue using ace wraps.  5. Continue taking OTC Tylenol.  6. Return to clinic in 3 weeks  with Dr. Milinda Pointer.       Edrick Kins, DPM [facility]  Dr. Edrick Kins, DPM    [address], [place] [address]                Office [phone]  Fax [phone]

## 2019-12-06 ENCOUNTER — Ambulatory Visit: Payer: PPO | Attending: Internal Medicine

## 2019-12-06 DIAGNOSIS — Z20822 Contact with and (suspected) exposure to covid-19: Secondary | ICD-10-CM

## 2019-12-06 DIAGNOSIS — Z20828 Contact with and (suspected) exposure to other viral communicable diseases: Secondary | ICD-10-CM | POA: Diagnosis not present

## 2019-12-07 LAB — NOVEL CORONAVIRUS, NAA: SARS-CoV-2, NAA: NOT DETECTED

## 2019-12-11 ENCOUNTER — Ambulatory Visit: Payer: PPO | Admitting: Podiatry

## 2019-12-25 ENCOUNTER — Encounter: Payer: Self-pay | Admitting: Podiatry

## 2019-12-25 ENCOUNTER — Ambulatory Visit (INDEPENDENT_AMBULATORY_CARE_PROVIDER_SITE_OTHER): Payer: PPO

## 2019-12-25 ENCOUNTER — Other Ambulatory Visit: Payer: Self-pay

## 2019-12-25 ENCOUNTER — Ambulatory Visit: Payer: PPO | Admitting: Podiatry

## 2019-12-25 DIAGNOSIS — S92344D Nondisplaced fracture of fourth metatarsal bone, right foot, subsequent encounter for fracture with routine healing: Secondary | ICD-10-CM

## 2019-12-25 DIAGNOSIS — S92334A Nondisplaced fracture of third metatarsal bone, right foot, initial encounter for closed fracture: Secondary | ICD-10-CM | POA: Diagnosis not present

## 2019-12-25 NOTE — Progress Notes (Signed)
She presents today for follow-up of her fractured her fourth metatarsal right foot states that it still hurts and is swollen on top and there is a knot there.  Objective: Vital signs are stable she is alert and oriented x3.  Pulses are palpable.  She still has red swollen dorsal aspect of the foot but is overlying the third metatarsal nail radiographs were taken today demonstrate a well-healing fourth metatarsal however she has a new fracture at the third metatarsal neck consistent with a stress fracture that is healing.  There is bone callus present there is no malalignment of the bone at this time.  Assessment: New stress fracture third metatarsal right foot.  Continue the use of her Darco shoe.  Follow-up with her in about 4 weeks for another set of x-rays.

## 2019-12-30 ENCOUNTER — Telehealth: Payer: Self-pay | Admitting: Family Medicine

## 2019-12-30 ENCOUNTER — Ambulatory Visit (INDEPENDENT_AMBULATORY_CARE_PROVIDER_SITE_OTHER): Payer: PPO

## 2019-12-30 ENCOUNTER — Other Ambulatory Visit: Payer: Self-pay

## 2019-12-30 DIAGNOSIS — E538 Deficiency of other specified B group vitamins: Secondary | ICD-10-CM

## 2019-12-30 NOTE — Telephone Encounter (Signed)
[name] was last given:11/22/2019 Bone Density done last at [facility] on 09/11/2018.  Patient notified by phone.

## 2019-12-30 NOTE — Progress Notes (Signed)
Patient came in for her B-12 injection. She tolerated it well. NKDA.   

## 2019-12-30 NOTE — Telephone Encounter (Signed)
Patient is calling to ask when was her last bone density and B-12. Please advise CB- [phone]

## 2020-01-01 DIAGNOSIS — M810 Age-related osteoporosis without current pathological fracture: Secondary | ICD-10-CM | POA: Diagnosis not present

## 2020-01-22 ENCOUNTER — Ambulatory Visit: Payer: PPO | Admitting: Podiatry

## 2020-01-22 ENCOUNTER — Encounter: Payer: Self-pay | Admitting: Podiatry

## 2020-01-22 ENCOUNTER — Ambulatory Visit (INDEPENDENT_AMBULATORY_CARE_PROVIDER_SITE_OTHER): Payer: PPO

## 2020-01-22 ENCOUNTER — Other Ambulatory Visit: Payer: Self-pay

## 2020-01-22 DIAGNOSIS — S92344D Nondisplaced fracture of fourth metatarsal bone, right foot, subsequent encounter for fracture with routine healing: Secondary | ICD-10-CM

## 2020-01-22 DIAGNOSIS — S92334A Nondisplaced fracture of third metatarsal bone, right foot, initial encounter for closed fracture: Secondary | ICD-10-CM

## 2020-01-22 NOTE — Progress Notes (Signed)
She presents today she is about 4 weeks out status post fracture third metatarsal neck right foot.  States that as long as she wears her little black surgical shoe she feels pretty good.  Much decrease in edema she says and its not as red.  Objective: Vital signs are stable alert and oriented x3 there is no erythema there is mild edema no cellulitis drainage or odor some tenderness on palpation of the neck of the third metatarsal.  Radiographs taken today demonstrate well healing fracture third metatarsal right.  Plan: I encouraged her to wear her Darco shoe for the next 2 weeks and I will follow-up with her in another month or so if necessary I want her to try to get back into regular shoes after 2 weeks.

## 2020-02-17 ENCOUNTER — Other Ambulatory Visit: Payer: Self-pay | Admitting: Family Medicine

## 2020-02-17 DIAGNOSIS — E038 Other specified hypothyroidism: Secondary | ICD-10-CM

## 2020-02-17 NOTE — Telephone Encounter (Signed)
Requested Prescriptions  Pending Prescriptions Disp Refills  . levothyroxine (SYNTHROID) 50 MCG tablet [Pharmacy Med Name: LEVOTHYROXINE 0.05MG  (50MCG) TAB] 102 tablet 0    Sig: TAKE 1 TABLET BY MOUTH EVERY DAY BEFORE BREAKFAST AND 2 TABLETS ON SUNDAY     Endocrinology:  Hypothyroid Agents Failed - 02/17/2020  4:08 PM      Failed - TSH needs to be rechecked within 3 months after an abnormal result. Refill until TSH is due.      Passed - TSH in normal range and within 360 days    TSH  Date Value Ref Range Status  05/21/2019 3.81 0.40 - 4.50 mIU/L Final         Passed - Valid encounter within last 12 months    Recent Outpatient Visits          5 months ago CKD (chronic kidney disease), stage III [facility])   [facility] Steele Sizer, MD   8 months ago Well woman exam   [facility] Steele Sizer, MD   9 months ago Neck pain on right side   [place], [place]   9 months ago Foot pain, right   [place], [place]   9 months ago CKD (chronic kidney disease), stage III [facility])   [facility] Steele Sizer, MD      Future Appointments            In 4 weeks Steele Sizer, MD [facility], [place]   In 6 months  [facility], [facility]

## 2020-02-18 ENCOUNTER — Other Ambulatory Visit: Payer: Self-pay

## 2020-02-18 ENCOUNTER — Ambulatory Visit (INDEPENDENT_AMBULATORY_CARE_PROVIDER_SITE_OTHER): Payer: PPO

## 2020-02-18 DIAGNOSIS — E538 Deficiency of other specified B group vitamins: Secondary | ICD-10-CM

## 2020-02-20 DIAGNOSIS — H401132 Primary open-angle glaucoma, bilateral, moderate stage: Secondary | ICD-10-CM | POA: Diagnosis not present

## 2020-02-21 ENCOUNTER — Ambulatory Visit: Payer: PPO | Admitting: Family Medicine

## 2020-02-28 DIAGNOSIS — H401132 Primary open-angle glaucoma, bilateral, moderate stage: Secondary | ICD-10-CM | POA: Diagnosis not present

## 2020-03-06 ENCOUNTER — Other Ambulatory Visit: Payer: Self-pay | Admitting: Family Medicine

## 2020-03-06 DIAGNOSIS — K219 Gastro-esophageal reflux disease without esophagitis: Secondary | ICD-10-CM

## 2020-03-16 ENCOUNTER — Other Ambulatory Visit: Payer: Self-pay

## 2020-03-16 ENCOUNTER — Ambulatory Visit (INDEPENDENT_AMBULATORY_CARE_PROVIDER_SITE_OTHER): Payer: PPO | Admitting: Family Medicine

## 2020-03-16 ENCOUNTER — Encounter: Payer: Self-pay | Admitting: Family Medicine

## 2020-03-16 VITALS — BP 124/76 | HR 87 | Temp 97.1°F | Resp 16 | Ht 65.5 in | Wt 180.6 lb

## 2020-03-16 DIAGNOSIS — E559 Vitamin D deficiency, unspecified: Secondary | ICD-10-CM | POA: Diagnosis not present

## 2020-03-16 DIAGNOSIS — M79644 Pain in right finger(s): Secondary | ICD-10-CM

## 2020-03-16 DIAGNOSIS — E039 Hypothyroidism, unspecified: Secondary | ICD-10-CM

## 2020-03-16 DIAGNOSIS — N1831 Chronic kidney disease, stage 3a: Secondary | ICD-10-CM | POA: Diagnosis not present

## 2020-03-16 DIAGNOSIS — E538 Deficiency of other specified B group vitamins: Secondary | ICD-10-CM | POA: Diagnosis not present

## 2020-03-16 DIAGNOSIS — G4700 Insomnia, unspecified: Secondary | ICD-10-CM

## 2020-03-16 DIAGNOSIS — E785 Hyperlipidemia, unspecified: Secondary | ICD-10-CM

## 2020-03-16 DIAGNOSIS — F411 Generalized anxiety disorder: Secondary | ICD-10-CM

## 2020-03-16 DIAGNOSIS — K219 Gastro-esophageal reflux disease without esophagitis: Secondary | ICD-10-CM

## 2020-03-16 DIAGNOSIS — M79645 Pain in left finger(s): Secondary | ICD-10-CM

## 2020-03-16 DIAGNOSIS — M810 Age-related osteoporosis without current pathological fracture: Secondary | ICD-10-CM

## 2020-03-16 DIAGNOSIS — J302 Other seasonal allergic rhinitis: Secondary | ICD-10-CM

## 2020-03-16 DIAGNOSIS — J3089 Other allergic rhinitis: Secondary | ICD-10-CM

## 2020-03-16 DIAGNOSIS — I10 Essential (primary) hypertension: Secondary | ICD-10-CM | POA: Diagnosis not present

## 2020-03-16 MED ORDER — ZOLPIDEM TARTRATE 5 MG PO TABS: ORAL_TABLET | ORAL | 1 refills | Status: DC

## 2020-03-16 MED ORDER — CITALOPRAM HYDROBROMIDE 10 MG PO TABS: 10.0000 mg | ORAL_TABLET | Freq: Every day | ORAL | 1 refills | Status: DC

## 2020-03-16 MED ORDER — LOSARTAN POTASSIUM 50 MG PO TABS: ORAL_TABLET | ORAL | 1 refills | Status: DC

## 2020-03-16 MED ORDER — LEVOCETIRIZINE DIHYDROCHLORIDE 5 MG PO TABS: 5.0000 mg | ORAL_TABLET | Freq: Every evening | ORAL | 1 refills | Status: DC

## 2020-03-16 MED ORDER — MONTELUKAST SODIUM 10 MG PO TABS: 10.0000 mg | ORAL_TABLET | Freq: Every day | ORAL | 1 refills | Status: DC | PRN

## 2020-03-16 MED ORDER — SIMVASTATIN 40 MG PO TABS: 40.0000 mg | ORAL_TABLET | Freq: Every day | ORAL | 1 refills | Status: DC

## 2020-03-16 MED ORDER — PANTOPRAZOLE SODIUM 40 MG PO TBEC: 40.0000 mg | DELAYED_RELEASE_TABLET | Freq: Every day | ORAL | 0 refills | Status: DC

## 2020-03-16 MED ORDER — ALPRAZOLAM 0.5 MG PO TABS: 0.5000 mg | ORAL_TABLET | Freq: Every day | ORAL | 0 refills | Status: DC | PRN

## 2020-03-16 NOTE — Patient Instructions (Signed)
[facility]

## 2020-03-16 NOTE — Progress Notes (Signed)
Name: Dawn Fuentes   MRN: [id number]    DOB: 18-Jul-1946   Date:03/16/2020       Progress Note  Subjective  Chief Complaint  Chief Complaint  Patient presents with  . Medication Refill    6 month F/U  . Chronic Kidney Disease  . Hypertension  . Gastroesophageal Reflux  . Hypothyroidism  . Dyslipidemia  . Insomnia  . Hand Pain    right thumb will achy sometimes.    HPI   GAD: she is feeling much better on Citalopram 10 mg, very seldom  Takes alprazolam now. She is happy with medications   HTN: bp at home is 120's Currently on Losartan 50 mg and no side effects. She denies chest pain or palpitation, denies edema, no dizziness. BP slightly high today, we will continue current regiment   CKI: we will monitor, sheisavoiding takingNSAID's, very seldom takes Meloxicam, GFR has been stable   OA knee: she has noticed worsening of pain on right knee when going up and down stairs and when first standing up from sitting position, both knees but right is worse than left. No effusion or redness, and is no limiting her activity level. She went to Ortho, had some PT andwas feeling better, still has pain going up and down stairs, Tumeric with black pepper. She is doing okay at this time, no pain or effusion   Thumb pain/bilaterally going on for months, she plays games on her phone and texts, no edema or redness, just sore when using her thumb, she will consider going to ortho if symptoms gets worse. Pain is described as aching usually a 4/10   Insomnia: she is taking Ambien5 mg at night, it is helping her fall and stay asleep, she has been doing well on medication   Hypothyroidism: no palpitation, change in bowel movements or dysphagia. Taking medication daily as prescribed.Last TSH at goalUnchanged   GERD: unable to wean self off PPI, she is back on it daily, no heartburn of indigestion as long as she takes medication.Stable, she states after dinner she has to clear her  throat. , advised to try taking PPI prior to dinner   Osteoporosis: she took fosamax long time ago, and is afraid to go back on therapy,she gets prolia, had a fracture of right foot in 2020 had to wear a boot , pain has resolved  Vulva soreness: stable at this time, using topical medication given by GYN   Hypothyroidism: she is taking medication as prescribed, no hair loss, palpitation or change in bowel movements   Patient Active Problem List   Diagnosis Date Noted  . Lichen sclerosus et atrophicus of the vulva 11/04/2019  . [name] deficiency 12/26/2018  . Osteoarthritis of knees, bilateral 05/26/2017  . Primary open angle glaucoma of both eyes 11/21/2016  . Chronic kidney disease (CKD), stage III (moderate) 04/06/2016  . Cystitis 10/12/2015  . Benign hypertension 08/21/2015  . Insomnia, persistent 08/21/2015  . Dyslipidemia 08/21/2015  . Gastro-esophageal reflux disease without esophagitis 08/21/2015  . Glaucoma 08/21/2015  . Blood glucose elevated 08/21/2015  . Adult hypothyroidism 08/21/2015  . Climacteric 08/21/2015  . Senile osteoporosis 08/21/2015  . Seasonal allergies 08/21/2015  . Female genuine stress incontinence 08/21/2015  . Vitamin D deficiency 08/21/2015    Past Surgical History:  Procedure Laterality Date  . ABDOMINAL HYSTERECTOMY     21 years ago  . APPENDECTOMY    . COLONOSCOPY WITH PROPOFOL N/A 05/29/2015   Procedure: COLONOSCOPY WITH PROPOFOL;  Surgeon: Eddie Dibbles  Johnell Comings, MD;  Location: [facility] ENDOSCOPY;  Service: Gastroenterology;  Laterality: N/A;  . ESOPHAGOGASTRODUODENOSCOPY N/A 05/29/2015   Procedure: ESOPHAGOGASTRODUODENOSCOPY (EGD);  Surgeon: Hulen Luster, MD;  Location: [facility] ENDOSCOPY;  Service: Gastroenterology;  Laterality: N/A;  . EYE SURGERY     eye lids  . OVARIAN CYST REMOVAL     needed transfusion  . PHOTOCOAGULATION WITH LASER Right 11/21/2016   Procedure: PHOTOCOAGULATION WITH LASER;  Surgeon: Ronnell Freshwater, MD;  Location: [place];  Service: Ophthalmology;  Laterality: Right;  A micropulse probe was applied to each hemilimbus with the following settings: 2060mW, 31.3% duty cycle, 90 seconds x 3 applications.     Family History  Problem Relation Age of Onset  . Hypertension Mother   . Osteoporosis Mother   . COPD Mother   . Dementia Mother   . Cancer Father        Colon  . Hypothyroidism Daughter   . Aneurysm Brother   . COPD Sister     Social History   Tobacco Use  . Smoking status: Never Smoker  . Smokeless tobacco: Never Used  . Tobacco comment: smoking cessation materials not required  Substance Use Topics  . Alcohol use: No    Alcohol/week: 0.0 standard drinks     Current Outpatient Medications:  .  ALPRAZolam (XANAX) 0.5 MG tablet, Take 1 tablet (0.5 mg total) by mouth daily as needed for anxiety. (Patient taking differently: Take 0.5 mg by mouth daily as needed for anxiety. Rarely), Disp: 20 tablet, Rfl: 0 .  aspirin 81 MG tablet, Take 81 mg by mouth daily. pm, Disp: , Rfl:  .  cholecalciferol (VITAMIN D) 1000 units tablet, Take 1,000 Units by mouth 2 (two) times daily. am, Disp: , Rfl:  .  citalopram (CELEXA) 10 MG tablet, Take 1 tablet (10 mg total) by mouth daily., Disp: 90 tablet, Rfl: 1 .  clobetasol ointment (TEMOVATE) 0.05 %, Apply to affected area every night for 4 weeks, then every other day for 4 weeks and then twice a week for 4 weeks or until resolution., Disp: 30 g, Rfl: 5 .  denosumab (PROLIA) 60 MG/ML SOSY injection, Inject into the skin every 6 (six) months. , Disp: , Rfl:  .  fluticasone (FLONASE) 50 MCG/ACT nasal spray, Place 2 sprays into both nostrils daily. Pm, Disp: 16 g, Rfl: 2 .  INTRAROSA 6.5 MG INST, I 1 SUP VAG HS, Disp: , Rfl:  .  levocetirizine (XYZAL) 5 MG tablet, Take 1 tablet (5 mg total) by mouth every evening., Disp: 90 tablet, Rfl: 1 .  levothyroxine (SYNTHROID) 50 MCG tablet, TAKE 1 TABLET BY MOUTH EVERY DAY BEFORE BREAKFAST AND 2 TABLETS ON SUNDAY, Disp: 102  tablet, Rfl: 0 .  losartan (COZAAR) 50 MG tablet, TAKE 1 TABLET BY MOUTH DAILY, Disp: 90 tablet, Rfl: 1 .  montelukast (SINGULAIR) 10 MG tablet, Take 1 tablet (10 mg total) by mouth daily as needed (allergies)., Disp: 90 tablet, Rfl: 1 .  Omega-3 Fatty Acids (FISH OIL PO), Take by mouth. AM, Disp: , Rfl:  .  pantoprazole (PROTONIX) 40 MG tablet, TAKE 1 TABLET(40 MG) BY MOUTH DAILY, Disp: 90 tablet, Rfl: 0 .  simvastatin (ZOCOR) 40 MG tablet, TAKE 1 TABLET(40 MG) BY MOUTH DAILY IN THE EVENING, Disp: 90 tablet, Rfl: 1 .  timolol (TIMOPTIC) 0.5 % ophthalmic solution, INT 1 GTT IN OU BID, Disp: , Rfl: 5 .  VYZULTA 0.024 % SOLN, INT 1 GTT INTO OU AT NIGHT,  Disp: , Rfl: 2 .  zolpidem (AMBIEN) 5 MG tablet, Take qhs, Disp: 90 tablet, Rfl: 1 .  Melatonin 3 MG CAPS, Take 1 capsule (3 mg total) by mouth daily. (Patient not taking: Reported on 03/16/2020), Disp: 90 capsule, Rfl: 0 .  meloxicam (MOBIC) 15 MG tablet, TAKE 1 TABLET(15 MG) BY MOUTH DAILY (Patient not taking: Reported on 03/16/2020), Disp: 90 tablet, Rfl: 0  Current Facility-Administered Medications:  .  cyanocobalamin ((VITAMIN B-12)) injection 1,000 mcg, 1,000 mcg, Intramuscular, Q30 days, Steele Sizer, MD, 1,000 mcg at 02/18/20 1621  Allergies  Allergen Reactions  . Brimonidine Tartrate   . Latanoprost   . Raloxifene     bone pain  . Sulfa Antibiotics Nausea Only    Intolerance rather than allergy.    I personally reviewed active problem list, medication list, allergies, family history, social history, health maintenance with the patient/caregiver today.   ROS  Constitutional: Negative for fever or weight change.  Respiratory: Negative for cough and shortness of breath.   Cardiovascular: Negative for chest pain or palpitations.  Gastrointestinal: Negative for abdominal pain, no bowel changes.  Musculoskeletal: Negative for gait problem or joint swelling.  Skin: Negative for rash.  Neurological: Negative for dizziness or  headache.  No other specific complaints in a complete review of systems (except as listed in HPI above).  Objective  Vitals:   03/16/20 1130  BP: 124/76  Pulse: 87  Resp: 16  Temp: (!) 97.1 F (36.2 C)  TempSrc: Temporal  SpO2: 94%  Weight: 180 lb 9.6 oz (81.9 kg)  Height: 5' 5.5" (1.664 m)    Body mass index is 29.6 kg/m.  Physical Exam  Constitutional: Patient appears well-developed and well-nourished. Overweight.  No distress.  HEENT: head atraumatic, normocephalic, pupils equal and reactive to light Cardiovascular: Normal rate, regular rhythm and normal heart sounds.  No murmur heard. No BLE edema. Pulmonary/Chest: Effort normal and breath sounds normal. No respiratory distress. Abdominal: Soft.  There is no tenderness. Psychiatric: Patient has a normal mood and affect. behavior is normal. Judgment and thought content normal.  PHQ2/9: Depression screen [facility] 2/9 03/16/2020 08/29/2019 08/23/2019 05/24/2019 05/14/2019  Decreased Interest 0 0 0 0 0  Down, Depressed, Hopeless 0 0 0 0 0  PHQ - 2 Score 0 0 0 0 0  Altered sleeping 1 - 0 1 0  Tired, decreased energy 0 - 0 0 0  Change in appetite 0 - 0 0 0  Feeling bad or failure about yourself  0 - 0 0 0  Trouble concentrating 0 - 0 0 0  Moving slowly or fidgety/restless 0 - 0 0 0  Suicidal thoughts 0 - 0 0 0  PHQ-9 Score 1 - 0 1 0  Difficult doing work/chores Not difficult at all - - Not difficult at all Not difficult at all  Some recent data might be hidden    phq 9 is negative   Fall Risk: Fall Risk  03/16/2020 08/29/2019 08/23/2019 08/23/2019 05/24/2019  Falls in the past year? 0 0 0 0 0  Number falls in past yr: 0 0 0 0 0  Injury with Fall? 0 0 0 0 0  Risk for fall due to : - - - - -  Risk for fall due to: Comment - - - - -  Follow up - Falls prevention discussed - - -     Functional Status Survey: Is the patient deaf or have difficulty hearing?: No Does the patient have difficulty seeing, even  when wearing  glasses/contacts?: Yes Does the patient have difficulty concentrating, remembering, or making decisions?: No Does the patient have difficulty walking or climbing stairs?: No Does the patient have difficulty dressing or bathing?: No Does the patient have difficulty doing errands alone such as visiting a doctor's office or shopping?: No    Assessment & Plan  1. [name] deficiency  - Vitamin [name]  2. GAD (generalized anxiety disorder)  - citalopram (CELEXA) 10 MG tablet; Take 1 tablet (10 mg total) by mouth daily.  Dispense: 90 tablet; Refill: 1 - ALPRAZolam (XANAX) 0.5 MG tablet; Take 1 tablet (0.5 mg total) by mouth daily as needed for anxiety. Rarely  Dispense: 20 tablet; Refill: 0  3. Stage 3a chronic kidney disease   4. Dyslipidemia  - simvastatin (ZOCOR) 40 MG tablet; Take 1 tablet (40 mg total) by mouth daily.  Dispense: 90 tablet; Refill: 1 - Lipid panel  5. Benign hypertension  - losartan (COZAAR) 50 MG tablet; TAKE 1 TABLET BY MOUTH DAILY  Dispense: 90 tablet; Refill: 1 - COMPLETE METABOLIC PANEL WITH GFR - CBC with Differential/Platelet  6. Insomnia, persistent  - zolpidem (AMBIEN) 5 MG tablet; Take qhs  Dispense: 90 tablet; Refill: 1  7. Vitamin D deficiency  - VITAMIN D 25 Hydroxy (Vit-D Deficiency, Fractures)  8. Gastro-esophageal reflux disease without esophagitis  - pantoprazole (PROTONIX) 40 MG tablet; Take 1 tablet (40 mg total) by mouth daily.  Dispense: 90 tablet; Refill: 0  9. Adult hypothyroidism  - TSH  10. Age related osteoporosis, unspecified pathological fracture presence   11. Perennial allergic rhinitis with seasonal variation  - montelukast (SINGULAIR) 10 MG tablet; Take 1 tablet (10 mg total) by mouth daily as needed (allergies).  Dispense: 90 tablet; Refill: 1 - levocetirizine (XYZAL) 5 MG tablet; Take 1 tablet (5 mg total) by mouth every evening.  Dispense: 90 tablet; Refill: 1  12. Bilateral thumb pain  Discussed avoiding using the  phone to play games  13. Anxiety state

## 2020-03-18 DIAGNOSIS — E538 Deficiency of other specified B group vitamins: Secondary | ICD-10-CM | POA: Diagnosis not present

## 2020-03-18 DIAGNOSIS — E039 Hypothyroidism, unspecified: Secondary | ICD-10-CM | POA: Diagnosis not present

## 2020-03-18 DIAGNOSIS — E785 Hyperlipidemia, unspecified: Secondary | ICD-10-CM | POA: Diagnosis not present

## 2020-03-18 DIAGNOSIS — I10 Essential (primary) hypertension: Secondary | ICD-10-CM | POA: Diagnosis not present

## 2020-03-18 DIAGNOSIS — E559 Vitamin D deficiency, unspecified: Secondary | ICD-10-CM | POA: Diagnosis not present

## 2020-03-19 LAB — COMPLETE METABOLIC PANEL WITHOUT GFR
AG Ratio: 1.6 (calc) (ref 1.0–2.5)
ALT: 35 U/L — ABNORMAL HIGH (ref 6–29)
AST: 33 U/L (ref 10–35)
Albumin: 4.2 g/dL (ref 3.6–5.1)
Alkaline phosphatase (APISO): 54 U/L (ref 37–153)
BUN/Creatinine Ratio: 14 (calc) (ref 6–22)
BUN: 15 mg/dL (ref 7–25)
CO2: 27 mmol/L (ref 20–32)
Calcium: 9.5 mg/dL (ref 8.6–10.4)
Chloride: 107 mmol/L (ref 98–110)
Creat: 1.1 mg/dL — ABNORMAL HIGH (ref 0.60–0.93)
GFR, Est African American: 58 mL/min/{1.73_m2} — ABNORMAL LOW
GFR, Est Non African American: 50 mL/min/{1.73_m2} — ABNORMAL LOW
Globulin: 2.6 g/dL (ref 1.9–3.7)
Glucose, Bld: 111 mg/dL — ABNORMAL HIGH (ref 65–99)
Potassium: 4.2 mmol/L (ref 3.5–5.3)
Sodium: 140 mmol/L (ref 135–146)
Total Bilirubin: 0.6 mg/dL (ref 0.2–1.2)
Total Protein: 6.8 g/dL (ref 6.1–8.1)

## 2020-03-19 LAB — CBC WITH DIFFERENTIAL/PLATELET
Absolute Monocytes: 726 {cells}/uL (ref 200–950)
Basophils Absolute: 110 {cells}/uL (ref 0–200)
Basophils Relative: 2 %
Eosinophils Absolute: 330 {cells}/uL (ref 15–500)
Eosinophils Relative: 6 %
HCT: 41.6 % (ref 35.0–45.0)
Hemoglobin: 13.6 g/dL (ref 11.7–15.5)
Lymphs Abs: 1166 {cells}/uL (ref 850–3900)
MCH: 30.1 pg (ref 27.0–33.0)
MCHC: 32.7 g/dL (ref 32.0–36.0)
MCV: 92 fL (ref 80.0–100.0)
MPV: 11.2 fL (ref 7.5–12.5)
Monocytes Relative: 13.2 %
Neutro Abs: 3168 {cells}/uL (ref 1500–7800)
Neutrophils Relative %: 57.6 %
Platelets: 238 10*3/uL (ref 140–400)
RBC: 4.52 Million/uL (ref 3.80–5.10)
RDW: 12.1 % (ref 11.0–15.0)
Total Lymphocyte: 21.2 %
WBC: 5.5 10*3/uL (ref 3.8–10.8)

## 2020-03-19 LAB — VITAMIN B12: Vitamin B-12: 1519 pg/mL — ABNORMAL HIGH (ref 200–1100)

## 2020-03-19 LAB — LIPID PANEL
Cholesterol: 182 mg/dL
HDL: 56 mg/dL
LDL Cholesterol (Calc): 102 mg/dL — ABNORMAL HIGH
Non-HDL Cholesterol (Calc): 126 mg/dL
Total CHOL/HDL Ratio: 3.3 (calc)
Triglycerides: 140 mg/dL

## 2020-03-19 LAB — VITAMIN D 25 HYDROXY (VIT D DEFICIENCY, FRACTURES): Vit D, 25-Hydroxy: 35 ng/mL (ref 30–100)

## 2020-03-19 LAB — TSH: TSH: 5.53 m[IU]/L — ABNORMAL HIGH (ref 0.40–4.50)

## 2020-05-06 ENCOUNTER — Telehealth: Payer: Self-pay | Admitting: Family Medicine

## 2020-05-06 NOTE — Chronic Care Management (AMB) (Signed)
  Chronic Care Management   Note  05/06/2020 Name: Dawn Fuentes MRN: [id number] DOB: 08-24-1946  Dawn Fuentes is a 74 y.o. year old female who is a primary care patient of Steele Sizer, MD. I reached out to [name] by phone today in response to a referral sent by Dawn Fuentes's health plan.     Dawn Fuentes was given information about Chronic Care Management services today including:  1. CCM service includes personalized support from designated clinical staff supervised by her physician, including individualized plan of care and coordination with other care providers 2. 24/7 contact phone numbers for assistance for urgent and routine care needs. 3. Service will only be billed when office clinical staff spend 20 minutes or more in a month to coordinate care. 4. Only one practitioner may furnish and bill the service in a calendar month. 5. The patient may stop CCM services at any time (effective at the end of the month) by phone call to the office staff. 6. The patient will be responsible for cost sharing (co-pay) of up to 20% of the service fee (after annual deductible is met).  Patient agreed to services and verbal consent obtained.   Follow up plan: Telephone appointment with care management team member scheduled for:05/13/2020  Dawn Fuentes, [place], [place], [place] [address] Direct Dial: [phone] [email or web address]: [email or web address]

## 2020-05-12 ENCOUNTER — Telehealth: Payer: Self-pay | Admitting: Family Medicine

## 2020-05-12 DIAGNOSIS — E038 Other specified hypothyroidism: Secondary | ICD-10-CM

## 2020-05-12 NOTE — Telephone Encounter (Signed)
Requested medications are due for refill today? Yes  Requested medications are on active medication list?  Yes  Last Refill:   02/17/2020  # 102 with no refills.    Future visit scheduled?  No   Notes to Clinic:  Checking to see if medication dose needs to be adjusted before refilling given her last TSH on 03/18/2020 of 5.53.

## 2020-05-13 ENCOUNTER — Telehealth: Payer: PPO

## 2020-05-13 ENCOUNTER — Other Ambulatory Visit: Payer: Self-pay | Admitting: Family Medicine

## 2020-05-13 ENCOUNTER — Other Ambulatory Visit: Payer: Self-pay

## 2020-05-13 ENCOUNTER — Ambulatory Visit: Payer: Self-pay

## 2020-05-13 DIAGNOSIS — E038 Other specified hypothyroidism: Secondary | ICD-10-CM

## 2020-05-13 DIAGNOSIS — E039 Hypothyroidism, unspecified: Secondary | ICD-10-CM

## 2020-05-13 NOTE — Telephone Encounter (Signed)
lvm to inform pt 

## 2020-05-13 NOTE — Chronic Care Management (AMB) (Signed)
  Chronic Care Management   Outreach Note  05/13/2020 Name: Dawn Fuentes MRN: [id number] DOB: [birth year]  Primary Care Provider: Steele Sizer, MD Reason for referral : Chronic Care Management    An unsuccessful telephone outreach was attempted today. [name] was referred to the case management team for assistance with care management and care coordination.   A HIPAA compliant voice message was left today requesting a return call.   Follow Up Plan The care management team will reach out to [name] again within the next two to three weeks.   [facility] Care [facility] [phone]

## 2020-05-14 DIAGNOSIS — E039 Hypothyroidism, unspecified: Secondary | ICD-10-CM | POA: Diagnosis not present

## 2020-05-15 LAB — TSH: TSH: 3.59 m[IU]/L (ref 0.40–4.50)

## 2020-06-01 ENCOUNTER — Ambulatory Visit: Payer: Self-pay

## 2020-06-01 ENCOUNTER — Telehealth: Payer: PPO

## 2020-06-01 NOTE — Chronic Care Management (AMB) (Signed)
  Chronic Care Management   Outreach Note  06/01/2020 Name: Dawn Fuentes MRN: [id number] DOB: June 28, 1946  Primary Care Provider: Steele Sizer, MD Reason for referral : Chronic Care Management   A second unsuccessful telephone outreach was attempted today. [name] was referred to the case management team for assistance with care management and care coordination.   A HIPAA compliant voice message was left today requesting a return call.   Follow Up Plan: The care management team will reach out to [name] again within the next two weeks.   [facility] Care [facility] [phone]

## 2020-06-05 ENCOUNTER — Other Ambulatory Visit: Payer: Self-pay

## 2020-06-05 ENCOUNTER — Encounter: Payer: Self-pay | Admitting: Emergency Medicine

## 2020-06-05 ENCOUNTER — Ambulatory Visit
Admission: EM | Admit: 2020-06-05 | Discharge: 2020-06-05 | Disposition: A | Discharge status: Home / Self Care | Payer: PPO | Attending: Physician Assistant | Admitting: Physician Assistant

## 2020-06-05 DIAGNOSIS — N390 Urinary tract infection, site not specified: Secondary | ICD-10-CM | POA: Diagnosis present

## 2020-06-05 DIAGNOSIS — M5432 Sciatica, left side: Secondary | ICD-10-CM | POA: Diagnosis present

## 2020-06-05 LAB — URINALYSIS, COMPLETE (UACMP) WITH MICROSCOPIC
Bilirubin Urine: NEGATIVE
Glucose, UA: NEGATIVE mg/dL
Ketones, ur: NEGATIVE mg/dL
Nitrite: NEGATIVE
Protein, ur: NEGATIVE mg/dL
Specific Gravity, Urine: 1.01 (ref 1.005–1.030)
pH: 5.5 (ref 5.0–8.0)

## 2020-06-05 MED ORDER — CEPHALEXIN 500 MG PO CAPS: 500.0000 mg | ORAL_CAPSULE | Freq: Three times a day (TID) | ORAL | 0 refills | Status: AC

## 2020-06-05 NOTE — ED Triage Notes (Signed)
Patient c/o left sided lower back pain that radiates down her left leg since Monday.  Patient c/o burning when urinating that started a week ago.

## 2020-06-05 NOTE — Discharge Instructions (Signed)
See your Physician for recheck in 1 week  °

## 2020-06-05 NOTE — ED Provider Notes (Addendum)
[facility]    CSN: [id number] Arrival date & time: 06/05/20  1105      History   Chief Complaint Chief Complaint  Patient presents with  . Back Pain    left side  . Leg Pain    left  . Dysuria    HPI Dawn Fuentes is a 74 y.o. female.   Pt complains of discomfort with urination.  Pt reports she also has pain in left buttock that radiates down the back of her leg. Pt thinks she has sciatica   The history is provided by the patient. No language interpreter was used.  Leg Pain Associated symptoms: no fever   Dysuria Pain quality:  Aching Pain severity:  Mild Timing:  Constant Progression:  Worsening Relieved by:  Nothing Ineffective treatments:  None tried Associated symptoms: no fever     Past Medical History:  Diagnosis Date  . Allergy   . Anxiety    ER visit in Sept 2017 for anxiety  . Arthritis   . Chronic insomnia   . Colon adenoma   . Dyslipidemia   . Dysphagia   . Female stress incontinence   . GERD (gastroesophageal reflux disease)   . [facility], both eyes    [facility]  . Hyperlipidemia   . Hypertension    controlled on meds  . Hypothyroidism   . Menopause syndrome   . Osteoporosis   . Polyp of colon   . Renal insufficiency    mild- keeping an eye on  . Vitamin D deficiency     Patient Active Problem List   Diagnosis Date Noted  . Lichen sclerosus et atrophicus of the vulva 11/04/2019  . [name] deficiency 12/26/2018  . Osteoarthritis of knees, bilateral 05/26/2017  . Primary open angle glaucoma of both eyes 11/21/2016  . Chronic kidney disease (CKD), stage III (moderate) 04/06/2016  . Cystitis 10/12/2015  . Benign hypertension 08/21/2015  . Insomnia, persistent 08/21/2015  . Dyslipidemia 08/21/2015  . Gastro-esophageal reflux disease without esophagitis 08/21/2015  . Glaucoma 08/21/2015  . Blood glucose elevated 08/21/2015  . Adult hypothyroidism 08/21/2015  . Climacteric 08/21/2015  . Senile  osteoporosis 08/21/2015  . Seasonal allergies 08/21/2015  . Female genuine stress incontinence 08/21/2015  . Vitamin D deficiency 08/21/2015    Past Surgical History:  Procedure Laterality Date  . ABDOMINAL HYSTERECTOMY     21 years ago  . APPENDECTOMY    . COLONOSCOPY WITH PROPOFOL N/A 05/29/2015   Procedure: COLONOSCOPY WITH PROPOFOL;  Surgeon: Hulen Luster, MD;  Location[facility] ENDOSCOPY;  Service: Gastroenterology;  Laterality: N/A;  . ESOPHAGOGASTRODUODENOSCOPY N/A 05/29/2015   Procedure: ESOPHAGOGASTRODUODENOSCOPY (EGD);  Surgeon: Hulen Luster, MD;  Location: [facility] ENDOSCOPY;  Service: Gastroenterology;  Laterality: N/A;  . EYE SURGERY     eye lids  . OVARIAN CYST REMOVAL     needed transfusion  . PHOTOCOAGULATION WITH LASER Right 11/21/2016   Procedure: PHOTOCOAGULATION WITH LASER;  Surgeon: Ronnell Freshwater, MD;  Location: [place];  Service: Ophthalmology;  Laterality: Right;  A micropulse probe was applied to each hemilimbus with the following settings: 2049mW, 31.3% duty cycle, 90 seconds x 3 applications.     OB History    Gravida  1   Para  1   Term  1   Preterm      AB      Living  1     SAB      TAB  Ectopic      Multiple      Live Births               Home Medications    Prior to Admission medications   Medication Sig Start Date End Date Taking? Authorizing Provider  aspirin 81 MG tablet Take 81 mg by mouth daily. pm   Yes [provider]  cholecalciferol (VITAMIN D) 1000 units tablet Take 1,000 Units by mouth 2 (two) times daily. am   Yes [provider]  citalopram (CELEXA) 10 MG tablet Take 1 tablet (10 mg total) by mouth daily. 03/16/20  Yes Sowles, Drue Stager, MD  denosumab (PROLIA) 60 MG/ML SOSY injection Inject into the skin every 6 (six) months.  04/16/19  Yes [provider]  fluticasone (FLONASE) 50 MCG/ACT nasal spray Place 2 sprays into both nostrils daily. Pm 09/11/18  Yes Sowles, Drue Stager, MD    levocetirizine (XYZAL) 5 MG tablet Take 1 tablet (5 mg total) by mouth every evening. 03/16/20  Yes Sowles, Drue Stager, MD  levothyroxine (SYNTHROID) 50 MCG tablet TAKE 1 TABLET BY MOUTH EVERY DAY BEFORE BREAKFAST AND 2 TABLETS ON SUNDAY 05/13/20  Yes Sowles, Drue Stager, MD  losartan (COZAAR) 50 MG tablet TAKE 1 TABLET BY MOUTH DAILY 03/16/20  Yes Sowles, Drue Stager, MD  montelukast (SINGULAIR) 10 MG tablet Take 1 tablet (10 mg total) by mouth daily as needed (allergies). 03/16/20  Yes Sowles, Drue Stager, MD  Omega-3 Fatty Acids (FISH OIL PO) Take by mouth. AM   Yes [provider]  pantoprazole (PROTONIX) 40 MG tablet Take 1 tablet (40 mg total) by mouth daily. 03/16/20  Yes Sowles, Drue Stager, MD  simvastatin (ZOCOR) 40 MG tablet Take 1 tablet (40 mg total) by mouth daily. 03/16/20  Yes Sowles, Drue Stager, MD  timolol (TIMOPTIC) 0.5 % ophthalmic solution INT 1 GTT IN OU BID 04/03/17  Yes [provider]  zolpidem (AMBIEN) 5 MG tablet Take qhs 03/16/20  Yes Sowles, Drue Stager, MD  ALPRAZolam Duanne Moron) 0.5 MG tablet Take 1 tablet (0.5 mg total) by mouth daily as needed for anxiety. Rarely 03/16/20   Steele Sizer, MD  clobetasol ointment (TEMOVATE) 0.05 % Apply to affected area every night for 4 weeks, then every other day for 4 weeks and then twice a week for 4 weeks or until resolution. 09/11/19   Homero Fellers, MD  INTRAROSA 6.5 MG INST I 1 SUP VAG HS 01/03/19   Rod Can, CNM  meloxicam (MOBIC) 15 MG tablet TAKE 1 TABLET(15 MG) BY MOUTH DAILY Patient not taking: Reported on 03/16/2020 11/29/19   Edrick Kins, DPM  VYZULTA 0.024 % SOLN INT 1 GTT INTO OU AT NIGHT 08/24/18   [provider]    Family History Family History  Problem Relation Age of Onset  . Hypertension Mother   . Osteoporosis Mother   . COPD Mother   . Dementia Mother   . Cancer Father        Colon  . Hypothyroidism Daughter   . Aneurysm Brother   . COPD Sister     Social History Social History   Tobacco  Use  . Smoking status: Never Smoker  . Smokeless tobacco: Never Used  . Tobacco comment: smoking cessation materials not required  Vaping Use  . Vaping Use: Never used  Substance Use Topics  . Alcohol use: No    Alcohol/week: 0.0 standard drinks  . Drug use: No     Allergies   Brimonidine tartrate, Latanoprost, Raloxifene, and Sulfa antibiotics  Review of Systems Review of Systems  Constitutional: Negative for fever.  Genitourinary: Positive for dysuria.  All other systems reviewed and are negative.    Physical Exam Triage Vital Signs ED Triage Vitals  Enc Vitals Group     BP 06/05/20 1121 (!) 151/92     Pulse Rate 06/05/20 1121 83     Resp 06/05/20 1121 14     Temp 06/05/20 1121 97.8 F (36.6 C)     Temp Source 06/05/20 1121 Oral     SpO2 06/05/20 1121 98 %     Weight 06/05/20 1117 177 lb (80.3 kg)     Height 06/05/20 1117 5\' 5"  (1.651 m)     Head Circumference --      Peak Flow --      Pain Score 06/05/20 1117 5     Pain Loc --      Pain Edu? --      Excl. in [place]? --    No data found.  Updated Vital Signs BP (!) 151/92 (BP Location: Left Arm)   Pulse 83   Temp 97.8 F (36.6 C) (Oral)   Resp 14   Ht 5\' 5"  (1.651 m)   Wt 80.3 kg   SpO2 98%   BMI 29.45 kg/m   Visual Acuity Right Eye Distance:   Left Eye Distance:   Bilateral Distance:    Right Eye Near:   Left Eye Near:    Bilateral Near:     Physical Exam Vitals and nursing note reviewed.  Constitutional:      Appearance: She is well-developed.  HENT:     Head: Normocephalic.  Cardiovascular:     Rate and Rhythm: Normal rate.  Pulmonary:     Effort: Pulmonary effort is normal.  Abdominal:     General: There is no distension.  Musculoskeletal:        General: Normal range of motion.     Cervical back: Normal range of motion.     Comments: Tender left sciatic notch   Skin:    General: Skin is warm.  Neurological:     Mental Status: She is alert and oriented to person, place, and  time.  Psychiatric:        Mood and Affect: Mood normal.      UC Treatments / Results  Labs (all labs ordered are listed, but only abnormal results are displayed) Labs Reviewed  URINALYSIS, COMPLETE (UACMP) WITH MICROSCOPIC - Abnormal; Notable for the following components:      Result Value   Hgb urine dipstick TRACE (*)    Leukocytes,Ua TRACE (*)    Bacteria, UA FEW (*)    All other components within normal limits    EKG   Radiology No results found.  Procedures Procedures (including critical care time)  Medications Ordered in UC Medications - No data to display  Initial Impression / Assessment and Plan / UC Course  I have reviewed the triage vital signs and the nursing notes.  Pertinent labs & imaging results that were available during my care of the patient were reviewed by me and considered in my medical decision making (see chart for details).     MDM:  I suspect pt has sciatica.  Pt given rx for keflex for uti.  Exercises for sciatica  Final Clinical Impressions(s) / UC Diagnoses   Final diagnoses:  Urinary tract infection without hematuria, site unspecified  Sciatica of left side     Discharge Instructions  See your Physician for recheck in 1 week     ED Prescriptions    Medication Sig Dispense Auth. Provider   cephALEXin (KEFLEX) 500 MG capsule Take 1 capsule (500 mg total) by mouth 3 (three) times daily for 7 days. 21 capsule Fransico Meadow, [place]     PDMP not reviewed this encounter.   Fransico Meadow, PA-C 06/05/20 1158    [address] [place] 06/07/20 [phone]

## 2020-06-06 ENCOUNTER — Other Ambulatory Visit: Payer: Self-pay | Admitting: Family Medicine

## 2020-06-06 DIAGNOSIS — K219 Gastro-esophageal reflux disease without esophagitis: Secondary | ICD-10-CM

## 2020-06-06 LAB — URINE CULTURE: Culture: NO GROWTH

## 2020-06-06 NOTE — Telephone Encounter (Signed)
Requested Prescriptions  Pending Prescriptions Disp Refills  . pantoprazole (PROTONIX) 40 MG tablet [Pharmacy Med Name: PANTOPRAZOLE 40MG  TABLETS] 90 tablet 2    Sig: TAKE 1 TABLET(40 MG) BY MOUTH DAILY     Gastroenterology: Proton Pump Inhibitors Passed - 06/06/2020  9:19 AM      Passed - Valid encounter within last 12 months    Recent Outpatient Visits          2 months ago Benign hypertension   [facility] [place], Drue Stager, MD   9 months ago CKD (chronic kidney disease), stage III [facility])   [facility] Steele Sizer, MD   1 year ago Well woman exam   [facility] Steele Sizer, MD   1 year ago Neck pain on right side   [place], FNP   1 year ago Foot pain, right   [place], [place]      Future Appointments            In 3 months  [facility], [place]   In 3 months Steele Sizer, MD [facility], [facility]

## 2020-06-09 DIAGNOSIS — M531 Cervicobrachial syndrome: Secondary | ICD-10-CM | POA: Diagnosis not present

## 2020-06-09 DIAGNOSIS — M461 Sacroiliitis, not elsewhere classified: Secondary | ICD-10-CM | POA: Diagnosis not present

## 2020-06-09 DIAGNOSIS — M9903 Segmental and somatic dysfunction of lumbar region: Secondary | ICD-10-CM | POA: Diagnosis not present

## 2020-06-09 DIAGNOSIS — M9902 Segmental and somatic dysfunction of thoracic region: Secondary | ICD-10-CM | POA: Diagnosis not present

## 2020-06-09 DIAGNOSIS — M9904 Segmental and somatic dysfunction of sacral region: Secondary | ICD-10-CM | POA: Diagnosis not present

## 2020-06-09 DIAGNOSIS — M9901 Segmental and somatic dysfunction of cervical region: Secondary | ICD-10-CM | POA: Diagnosis not present

## 2020-06-15 ENCOUNTER — Telehealth: Payer: PPO

## 2020-06-15 ENCOUNTER — Ambulatory Visit: Payer: Self-pay

## 2020-06-15 DIAGNOSIS — M9901 Segmental and somatic dysfunction of cervical region: Secondary | ICD-10-CM | POA: Diagnosis not present

## 2020-06-15 DIAGNOSIS — M9902 Segmental and somatic dysfunction of thoracic region: Secondary | ICD-10-CM | POA: Diagnosis not present

## 2020-06-15 DIAGNOSIS — M9904 Segmental and somatic dysfunction of sacral region: Secondary | ICD-10-CM | POA: Diagnosis not present

## 2020-06-15 DIAGNOSIS — M461 Sacroiliitis, not elsewhere classified: Secondary | ICD-10-CM | POA: Diagnosis not present

## 2020-06-15 DIAGNOSIS — M9903 Segmental and somatic dysfunction of lumbar region: Secondary | ICD-10-CM | POA: Diagnosis not present

## 2020-06-15 DIAGNOSIS — M531 Cervicobrachial syndrome: Secondary | ICD-10-CM | POA: Diagnosis not present

## 2020-06-15 NOTE — Chronic Care Management (AMB) (Signed)
  Chronic Care Management   Outreach Note  06/15/2020 Name: Dawn Fuentes MRN: [id number] DOB: August 03, 1946  Primary Care Provider: Steele Sizer, MD Reason for referral : Chronic Care Management   An unsuccessful telephone outreach was attempted today. [name] was referred to the case management team for assistance with care management and care coordination. Since the last outreach attempt, [name] left a message acknowledging the call.  A HIPAA compliant voice message was left today.    Follow Up Plan The care management team will reach out to [name] again on 07/01/20.   [facility] Care Management [phone]

## 2020-06-18 DIAGNOSIS — M9902 Segmental and somatic dysfunction of thoracic region: Secondary | ICD-10-CM | POA: Diagnosis not present

## 2020-06-18 DIAGNOSIS — M9903 Segmental and somatic dysfunction of lumbar region: Secondary | ICD-10-CM | POA: Diagnosis not present

## 2020-06-18 DIAGNOSIS — M9901 Segmental and somatic dysfunction of cervical region: Secondary | ICD-10-CM | POA: Diagnosis not present

## 2020-06-18 DIAGNOSIS — M4606 Spinal enthesopathy, lumbar region: Secondary | ICD-10-CM | POA: Diagnosis not present

## 2020-06-18 DIAGNOSIS — M9904 Segmental and somatic dysfunction of sacral region: Secondary | ICD-10-CM | POA: Diagnosis not present

## 2020-06-18 DIAGNOSIS — M461 Sacroiliitis, not elsewhere classified: Secondary | ICD-10-CM | POA: Diagnosis not present

## 2020-06-24 ENCOUNTER — Other Ambulatory Visit: Payer: Self-pay | Admitting: Family Medicine

## 2020-06-24 ENCOUNTER — Encounter: Payer: Self-pay | Admitting: Family Medicine

## 2020-06-24 ENCOUNTER — Ambulatory Visit
Admission: RE | Admit: 2020-06-24 | Discharge: 2020-06-24 | Disposition: A | Discharge status: Home / Self Care | Payer: PPO | Attending: Family Medicine | Admitting: Family Medicine

## 2020-06-24 ENCOUNTER — Ambulatory Visit (INDEPENDENT_AMBULATORY_CARE_PROVIDER_SITE_OTHER): Payer: PPO | Admitting: Family Medicine

## 2020-06-24 ENCOUNTER — Other Ambulatory Visit: Payer: Self-pay

## 2020-06-24 ENCOUNTER — Ambulatory Visit
Admission: RE | Admit: 2020-06-24 | Discharge: 2020-06-24 | Disposition: A | Discharge status: Home / Self Care | Payer: PPO | Source: Ambulatory Visit | Attending: Family Medicine | Admitting: Family Medicine

## 2020-06-24 VITALS — BP 128/84 | HR 97 | Temp 97.9°F | Resp 16 | Ht 65.0 in | Wt 180.4 lb

## 2020-06-24 DIAGNOSIS — R519 Headache, unspecified: Secondary | ICD-10-CM

## 2020-06-24 DIAGNOSIS — E538 Deficiency of other specified B group vitamins: Secondary | ICD-10-CM | POA: Diagnosis not present

## 2020-06-24 DIAGNOSIS — M542 Cervicalgia: Secondary | ICD-10-CM | POA: Diagnosis not present

## 2020-06-24 DIAGNOSIS — M5412 Radiculopathy, cervical region: Secondary | ICD-10-CM | POA: Insufficient documentation

## 2020-06-24 MED ORDER — BACLOFEN 10 MG PO TABS: 10.0000 mg | ORAL_TABLET | Freq: Three times a day (TID) | ORAL | 0 refills | Status: DC | PRN

## 2020-06-24 MED ORDER — MELOXICAM 15 MG PO TABS: 15.0000 mg | ORAL_TABLET | Freq: Every day | ORAL | 0 refills | Status: DC

## 2020-06-24 MED ORDER — CYANOCOBALAMIN 1000 MCG/ML IJ SOLN
1000.0000 ug | Freq: Once | INTRAMUSCULAR | Status: AC
  Administered 2020-06-24: 1000 ug via INTRAMUSCULAR

## 2020-06-24 MED ORDER — CYANOCOBALAMIN 1000 MCG/ML IJ SOLN: 1000.0000 ug | Freq: Once | INTRAMUSCULAR | 0 refills | Status: AC

## 2020-06-24 MED ORDER — GABAPENTIN 100 MG PO CAPS: 100.0000 mg | ORAL_CAPSULE | Freq: Every day | ORAL | 0 refills | Status: DC

## 2020-06-24 NOTE — Progress Notes (Signed)
Name: Dawn Fuentes   MRN: [id number]    DOB: Mar 26, 1946   Date:06/24/2020       Progress Note  Subjective  Chief Complaint  Chief Complaint  Patient presents with  . Neck Pain    HPI   Neck pain: she has a long history of neck pain, usually resolves when she gets adjusted by chiropractor. However Dr. Laveda Abbe retired. This episodes started one month , she saw a new chiropractor - 3 sessions and is not resolving the pain. She states pain on her neck is stiff, when she rotates her neck to the right she has a dull ache on posterior shoulder and top part of arm. No weakness, no tingling or numbness. She has been using some pain patches and heating pad.   Headache: she has noticed an intermittent throbbing sensation on right temporal area that radiates to posterior ear - at that location pain is dull , it has been almost daily, it resolves once she takes Tylenol, not associated with nausea, vomiting, or sound sensitivity, hearing loss. No visual problems. She states only once had light sensitivity with the headache    Patient Active Problem List   Diagnosis Date Noted  . Lichen sclerosus et atrophicus of the vulva 11/04/2019  . [name] deficiency 12/26/2018  . Osteoarthritis of knees, bilateral 05/26/2017  . Primary open angle glaucoma of both eyes 11/21/2016  . Chronic kidney disease (CKD), stage III (moderate) 04/06/2016  . Cystitis 10/12/2015  . Benign hypertension 08/21/2015  . Insomnia, persistent 08/21/2015  . Dyslipidemia 08/21/2015  . Gastro-esophageal reflux disease without esophagitis 08/21/2015  . Glaucoma 08/21/2015  . Blood glucose elevated 08/21/2015  . Adult hypothyroidism 08/21/2015  . Climacteric 08/21/2015  . Senile osteoporosis 08/21/2015  . Seasonal allergies 08/21/2015  . Female genuine stress incontinence 08/21/2015  . Vitamin D deficiency 08/21/2015    Past Surgical History:  Procedure Laterality Date  . ABDOMINAL HYSTERECTOMY     21 years ago  . APPENDECTOMY     . COLONOSCOPY WITH PROPOFOL N/A 05/29/2015   Procedure: COLONOSCOPY WITH PROPOFOL;  Surgeon: Hulen Luster, MD;  Location: [facility] ENDOSCOPY;  Service: Gastroenterology;  Laterality: N/A;  . ESOPHAGOGASTRODUODENOSCOPY N/A 05/29/2015   Procedure: ESOPHAGOGASTRODUODENOSCOPY (EGD);  Surgeon: Hulen Luster, MD;  Location: [facility] ENDOSCOPY;  Service: Gastroenterology;  Laterality: N/A;  . EYE SURGERY     eye lids  . OVARIAN CYST REMOVAL     needed transfusion  . PHOTOCOAGULATION WITH LASER Right 11/21/2016   Procedure: PHOTOCOAGULATION WITH LASER;  Surgeon: Ronnell Freshwater, MD;  Location: [place];  Service: Ophthalmology;  Laterality: Right;  A micropulse probe was applied to each hemilimbus with the following settings: 2015mW, 31.3% duty cycle, 90 seconds x 3 applications.     Family History  Problem Relation Age of Onset  . Hypertension Mother   . Osteoporosis Mother   . COPD Mother   . Dementia Mother   . Cancer Father        Colon  . Hypothyroidism Daughter   . Aneurysm Brother   . COPD Sister     Social History   Tobacco Use  . Smoking status: Never Smoker  . Smokeless tobacco: Never Used  . Tobacco comment: smoking cessation materials not required  Substance Use Topics  . Alcohol use: No    Alcohol/week: 0.0 standard drinks     Current Outpatient Medications:  .  ALPRAZolam (XANAX) 0.5 MG tablet, Take 1 tablet (0.5 mg total) by mouth daily  as needed for anxiety. Rarely, Disp: 20 tablet, Rfl: 0 .  aspirin 81 MG tablet, Take 81 mg by mouth daily. pm, Disp: , Rfl:  .  cholecalciferol (VITAMIN D) 1000 units tablet, Take 1,000 Units by mouth 2 (two) times daily. am, Disp: , Rfl:  .  citalopram (CELEXA) 10 MG tablet, Take 1 tablet (10 mg total) by mouth daily., Disp: 90 tablet, Rfl: 1 .  clobetasol ointment (TEMOVATE) 0.05 %, Apply to affected area every night for 4 weeks, then every other day for 4 weeks and then twice a week for 4 weeks or until resolution., Disp: 30 g,  Rfl: 5 .  denosumab (PROLIA) 60 MG/ML SOSY injection, Inject into the skin every 6 (six) months. , Disp: , Rfl:  .  fluticasone (FLONASE) 50 MCG/ACT nasal spray, Place 2 sprays into both nostrils daily. Pm, Disp: 16 g, Rfl: 2 .  levocetirizine (XYZAL) 5 MG tablet, Take 1 tablet (5 mg total) by mouth every evening., Disp: 90 tablet, Rfl: 1 .  levothyroxine (SYNTHROID) 50 MCG tablet, TAKE 1 TABLET BY MOUTH EVERY DAY BEFORE BREAKFAST AND 2 TABLETS ON SUNDAY, Disp: 102 tablet, Rfl: 0 .  losartan (COZAAR) 50 MG tablet, TAKE 1 TABLET BY MOUTH DAILY, Disp: 90 tablet, Rfl: 1 .  montelukast (SINGULAIR) 10 MG tablet, Take 1 tablet (10 mg total) by mouth daily as needed (allergies)., Disp: 90 tablet, Rfl: 1 .  Omega-3 Fatty Acids (FISH OIL PO), Take by mouth. AM, Disp: , Rfl:  .  pantoprazole (PROTONIX) 40 MG tablet, TAKE 1 TABLET(40 MG) BY MOUTH DAILY, Disp: 90 tablet, Rfl: 2 .  simvastatin (ZOCOR) 40 MG tablet, Take 1 tablet (40 mg total) by mouth daily., Disp: 90 tablet, Rfl: 1 .  timolol (TIMOPTIC) 0.5 % ophthalmic solution, INT 1 GTT IN OU BID, Disp: , Rfl: 5 .  VYZULTA 0.024 % SOLN, INT 1 GTT INTO OU AT NIGHT, Disp: , Rfl: 2 .  zolpidem (AMBIEN) 5 MG tablet, Take qhs, Disp: 90 tablet, Rfl: 1 .  INTRAROSA 6.5 MG INST, I 1 SUP VAG HS (Patient not taking: Reported on 06/24/2020), Disp: , Rfl:  .  meloxicam (MOBIC) 15 MG tablet, TAKE 1 TABLET(15 MG) BY MOUTH DAILY (Patient not taking: Reported on 06/24/2020), Disp: 90 tablet, Rfl: 0  Current Facility-Administered Medications:  .  cyanocobalamin ((VITAMIN B-12)) injection 1,000 mcg, 1,000 mcg, Intramuscular, Q30 days, Steele Sizer, MD, 1,000 mcg at 03/16/20 1331  Allergies  Allergen Reactions  . Brimonidine Tartrate   . Latanoprost   . Raloxifene     bone pain  . Sulfa Antibiotics Nausea Only    Intolerance rather than allergy.    I personally reviewed active problem list, medication list, allergies, family history, social history, health  maintenance with the patient/caregiver today.   ROS  Constitutional: Negative for fever or weight change.  Respiratory: Negative for cough and shortness of breath.   Cardiovascular: Negative for chest pain or palpitations.  Gastrointestinal: Negative for abdominal pain, no bowel changes.  Musculoskeletal: Negative for gait problem or joint swelling.  Skin: Negative for rash.  Neurological: Negative for dizziness, positive for  headache.  No other specific complaints in a complete review of systems (except as listed in HPI above).  Objective  Vitals:   06/24/20 0904  BP: 128/84  Pulse: 97  Resp: 16  Temp: 97.9 F (36.6 C)  TempSrc: Temporal  SpO2: 99%  Weight: 180 lb 6.4 oz (81.8 kg)  Height: 5\' 5"  (1.651 m)  Body mass index is 30.02 kg/m.  Physical Exam  Constitutional: Patient appears well-developed and well-nourished. No distress.  HEENT: head atraumatic, normocephalic, pupils equal and reactive to light, ears normal TM bilaterally, neck pain when rotating to the right and extending neck, no pain during palpation of spinal processes  Cardiovascular: Normal rate, regular rhythm and normal heart sounds.  No murmur heard. No BLE edema. Pulmonary/Chest: Effort normal and breath sounds normal. No respiratory distress. Abdominal: Soft.  There is no tenderness. Muscular Skeletal: normal rom of shoulders Psychiatric: Patient has a normal mood and affect. behavior is normal. Judgment and thought content normal.  Recent Results (from the past 2160 hour(s))  TSH     Status: None   Collection Time: 05/14/20 11:42 AM  Result Value Ref Range   TSH 3.59 0.40 - 4.50 mIU/L  Urinalysis, Complete w Microscopic     Status: Abnormal   Collection Time: 06/05/20 11:22 AM  Result Value Ref Range   Color, Urine YELLOW YELLOW   APPearance CLEAR CLEAR   Specific Gravity, Urine 1.010 1.005 - 1.030   pH 5.5 5.0 - 8.0   Glucose, UA NEGATIVE NEGATIVE mg/dL   Hgb urine dipstick TRACE (A)  NEGATIVE   Bilirubin Urine NEGATIVE NEGATIVE   Ketones, ur NEGATIVE NEGATIVE mg/dL   Protein, ur NEGATIVE NEGATIVE mg/dL   Nitrite NEGATIVE NEGATIVE   Leukocytes,Ua TRACE (A) NEGATIVE   Squamous Epithelial / LPF 0-5 0 - 5   WBC, UA 6-10 0 - 5 WBC/hpf   RBC / HPF 0-5 0 - 5 RBC/hpf   Bacteria, UA FEW (A) NONE SEEN   WBC Clumps PRESENT     Comment: Performed at [facility], [address] [place], [place] [address]  Urine culture     Status: None   Collection Time: 06/05/20 11:22 AM   Specimen: Urine, Clean Catch  Result Value Ref Range   Specimen Description      URINE, CLEAN CATCH Performed at [facility], [address], [place], [place] [address]    Special Requests      NONE Performed at [facility], [address], [place], [place] [address]    Culture      NO GROWTH Performed at [facility], [place] [address], [place], [place] [address]    Report Status 06/06/2020 FINAL      PHQ2/9: Depression screen [facility] 2/9 06/24/2020 03/16/2020 08/29/2019 08/23/2019 05/24/2019  Decreased Interest 0 0 0 0 0  Down, Depressed, Hopeless 0 0 0 0 0  PHQ - 2 Score 0 0 0 0 0  Altered sleeping 1 1 - 0 1  Tired, decreased energy 0 0 - 0 0  Change in appetite 0 0 - 0 0  Feeling bad or failure about yourself  0 0 - 0 0  Trouble concentrating 0 0 - 0 0  Moving slowly or fidgety/restless - 0 - 0 0  Suicidal thoughts 0 0 - 0 0  PHQ-9 Score 1 1 - 0 1  Difficult doing work/chores Not difficult at all Not difficult at all - - Not difficult at all  Some recent data might be hidden    phq 9 is negative   Fall Risk: Fall Risk  06/24/2020 03/16/2020 08/29/2019 08/23/2019 08/23/2019  Falls in the past year? 0 0 0 0 0  Number falls in past yr: 0 0 0 0 0  Injury with Fall? 0 0 0 0 0  Risk for fall  due to : - - - - -  Risk for fall due to: Comment - - - - -  Follow up - - Falls prevention discussed - -     Functional Status Survey: Is the patient deaf or have  difficulty hearing?: No Does the patient have difficulty seeing, even when wearing glasses/contacts?: No Does the patient have difficulty concentrating, remembering, or making decisions?: No Does the patient have difficulty walking or climbing stairs?: No Does the patient have difficulty dressing or bathing?: No Does the patient have difficulty doing errands alone such as visiting a doctor's office or shopping?: No    Assessment & Plan  1. Cervical radiculitis  - meloxicam (MOBIC) 15 MG tablet; Take 1 tablet (15 mg total) by mouth daily. With food for inflammation  Dispense: 30 tablet; Refill: 0 - baclofen (LIORESAL) 10 MG tablet; Take 1 tablet (10 mg total) by mouth 3 (three) times daily as needed for muscle spasms. It may make you sleepy - do not drive  Dispense: 60 each; Refill: 0 - gabapentin (NEURONTIN) 100 MG capsule; Take 1-3 capsules (100-300 mg total) by mouth at bedtime. Start with 1 and go up as tolerated max 300 mg at night  Dispense: 90 capsule; Refill: 0 - DG Cervical Spine Complete; Future  2. Nonintractable episodic headache, unspecified headache type  - gabapentin (NEURONTIN) 100 MG capsule; Take 1-3 capsules (100-300 mg total) by mouth at bedtime. Start with 1 and go up as tolerated max 300 mg at night  Dispense: 90 capsule; Refill: 0

## 2020-06-24 NOTE — Patient Instructions (Signed)
Cervical Radiculopathy  Cervical radiculopathy happens when a nerve in the neck (a cervical nerve) is pinched or bruised. This condition can happen because of an injury to the cervical spine (vertebrae) in the neck, or as part of the normal aging process. Pressure on the cervical nerves can cause pain or numbness that travels from the neck all the way down into the arm and fingers. Usually, this condition gets better with rest. Treatment may be needed if the condition does not improve. What are the causes? This condition may be caused by:  A neck injury.  A bulging (herniated) disk.  Muscle spasms.  Muscle tightness in the neck because of overuse.  Arthritis.  Breakdown or degeneration in the bones and joints of the spine (spondylosis) due to aging.  Bone spurs that may develop near the cervical nerves. What are the signs or symptoms? Symptoms of this condition include:  Pain. The pain may travel from the neck to the arm and hand. The pain can be severe or irritating. It may be worse when you move your neck.  Numbness or tingling in your arm or hand.  Weakness in the affected arm and hand, in severe cases. How is this diagnosed? This condition may be diagnosed based on your symptoms, your medical history, and a physical exam. You may also have tests, including:  X-rays.  A CT scan.  An MRI.  An electromyogram (EMG).  Nerve conduction tests. How is this treated? In many cases, treatment is not needed for this condition. With rest, the condition usually gets better over time. If treatment is needed, options may include:  Wearing a soft neck collar (cervical collar) for short periods of time, as told by your health care provider.  Doing physical therapy to strengthen your neck muscles.  Taking medicines, such as NSAIDs or oral corticosteroids.  Having spinal injections, in severe cases.  Having surgery. This may be needed if other treatments do not help. Different types  of surgery may be done depending on the cause of this condition. Follow these instructions at home: If you have a cervical collar:  Wear it as told by your health care provider. Remove it only as told by your health care provider.  Ask your health care provider if you can remove the collar for cleaning and bathing. If you are allowed to remove the collar for cleaning or bathing: ? Follow instructions from your health care provider about how to remove the collar safely. ? Clean the collar by wiping it with mild soap and water and drying it completely. ? Take out any removable pads in the collar every 1-2 days, and wash them by hand with soap and water. Let them air-dry completely before you put them back in the collar. ? Check your skin under the collar for irritation or sores. If you see any, tell your health care provider. Managing pain      Take over-the-counter and prescription medicines only as told by your health care provider.  If directed, put ice on the affected area. ? If you have a soft neck collar, remove it as told by your health care provider. ? Put ice in a plastic bag. ? Place a towel between your skin and the bag. ? Leave the ice on for 20 minutes, 2-3 times a day.  If applying ice does not help, you can try using heat. Use the heat source that your health care provider recommends, such as a moist heat pack or a heating pad. ?  Place a towel between your skin and the heat source. °? Leave the heat on for 20-30 minutes. °? Remove the heat if your skin turns bright red. This is especially important if you are unable to feel pain, heat, or cold. You may have a greater risk of getting burned. °· Try a gentle neck and shoulder massage to help relieve symptoms. °Activity °· Rest as needed. °· Return to your normal activities as told by your health care provider. Ask your health care provider what activities are safe for you. °· Do stretching and strengthening exercises as told by  your health care provider or physical therapist. °· Do not lift anything that is heavier than 10 lb (4.5 kg) until your health care provider tells you that it is safe. °General instructions °· Use a flat pillow when you sleep. °· Do not drive while wearing a cervical collar. If you do not have a cervical collar, ask your health care provider if it is safe to drive while your neck heals. °· Ask your health care provider if the medicine prescribed to you requires you to avoid driving or using heavy machinery. °· Do not use any products that contain nicotine or tobacco, such as cigarettes, e-cigarettes, and chewing tobacco. These can delay healing. If you need help quitting, ask your health care provider. °· Keep all follow-up visits as told by your health care provider. This is important. °Contact a health care provider if: °· Your condition does not improve with treatment. °Get help right away if: °· Your pain gets much worse and cannot be controlled with medicines. °· You have weakness or numbness in your hand, arm, face, or leg. °· You have a high fever. °· You have a stiff, rigid neck. °· You lose control of your bowels or your bladder (have incontinence). °· You have trouble with walking, balance, or speaking. °Summary °· Cervical radiculopathy happens when a nerve in the neck is pinched or bruised. °· A nerve can get pinched from a bulging disk, arthritis, muscle spasms, or an injury to the neck. °· Symptoms include pain, tingling, or numbness radiating from the neck into the arm or hand. Weakness can also occur in severe cases. °· Treatment may include rest, wearing a cervical collar, and physical therapy. Medicines may be prescribed to help with pain. In severe cases, injections or surgery may be needed. °This information is not intended to replace advice given to you by your health care provider. Make sure you discuss any questions you have with your health care provider. °Document Revised: 10/26/2018  Document Reviewed: 10/26/2018 °Elsevier Patient Education © 2020 [employer] ° °

## 2020-06-24 NOTE — Addendum Note (Signed)
Addended by: Royal Hawthorn on: 06/24/2020 10:23 AM   Modules accepted: Orders

## 2020-06-25 ENCOUNTER — Encounter: Payer: Self-pay | Admitting: Family Medicine

## 2020-06-25 DIAGNOSIS — M5030 Other cervical disc degeneration, unspecified cervical region: Secondary | ICD-10-CM | POA: Insufficient documentation

## 2020-07-01 ENCOUNTER — Ambulatory Visit: Payer: Self-pay

## 2020-07-01 ENCOUNTER — Telehealth: Payer: PPO

## 2020-07-01 NOTE — Chronic Care Management (AMB) (Signed)
  Chronic Care Management   Outreach Note  07/01/2020 Name: Dawn Fuentes MRN: [id number] DOB: May 08, 1946  Primary Care Provider: Steele Sizer, MD Reason for referral : Chronic Care Management    An unsuccessful telephone outreach was attempted today. [name] was referred to the case management team for assistance with care management and care coordination. Since the last outreach attempt,she left a voice message acknowledging receipt of the calls.   A HIPAA compliant voice message was left today requesting a return call when she is available.   Follow Up Plan: Anticipate outreach within the next two weeks.    [facility] [phone]

## 2020-07-08 ENCOUNTER — Ambulatory Visit: Payer: Self-pay

## 2020-07-08 ENCOUNTER — Telehealth: Payer: PPO

## 2020-07-08 NOTE — Chronic Care Management (AMB) (Signed)
  Chronic Care Management   Outreach Note  07/08/2020 Name: Dawn Fuentes MRN: [id number] DOB: [birth year]  Primary Care Provider: Steele Sizer, MD Reason for referral : Chronic Care Management   Dawn Fuentes was referred to the care management team for assistance with chronic care management and care coordination. Her  primary care provider will be notified of our unsuccessful attempts to establish and maintain contact. The care management team will gladly outreach at any time in the future if she is interested in receiving assistance.   PLAN The care management team will gladly follow up with Dawn Fuentes after the primary care provider has a conversation with her regarding recommendation for care management engagement and subsequent re-referral for care management services.    [facility] [phone]

## 2020-07-09 DIAGNOSIS — M810 Age-related osteoporosis without current pathological fracture: Secondary | ICD-10-CM | POA: Diagnosis not present

## 2020-07-22 DIAGNOSIS — R1310 Dysphagia, unspecified: Secondary | ICD-10-CM | POA: Diagnosis not present

## 2020-07-22 DIAGNOSIS — Z86010 Personal history of colonic polyps: Secondary | ICD-10-CM | POA: Diagnosis not present

## 2020-07-22 DIAGNOSIS — M810 Age-related osteoporosis without current pathological fracture: Secondary | ICD-10-CM | POA: Diagnosis not present

## 2020-07-22 DIAGNOSIS — Z9889 Other specified postprocedural states: Secondary | ICD-10-CM | POA: Diagnosis not present

## 2020-07-22 DIAGNOSIS — Z01812 Encounter for preprocedural laboratory examination: Secondary | ICD-10-CM | POA: Diagnosis not present

## 2020-07-22 DIAGNOSIS — Z800 Family history of malignant neoplasm of digestive organs: Secondary | ICD-10-CM | POA: Diagnosis not present

## 2020-07-22 DIAGNOSIS — Z8719 Personal history of other diseases of the digestive system: Secondary | ICD-10-CM | POA: Diagnosis not present

## 2020-07-22 DIAGNOSIS — K219 Gastro-esophageal reflux disease without esophagitis: Secondary | ICD-10-CM | POA: Diagnosis not present

## 2020-08-19 ENCOUNTER — Other Ambulatory Visit: Payer: Self-pay | Admitting: Family Medicine

## 2020-08-19 DIAGNOSIS — F411 Generalized anxiety disorder: Secondary | ICD-10-CM

## 2020-08-19 NOTE — Telephone Encounter (Signed)
Requested medication (s) are due for refill today:yes  Requested medication (s) are on the active medication list: yes   Last refill: 03/16/20   #20  0 refills  Future visit scheduled Yes 09/04/20  Notes to clinic: not delegated  Requested Prescriptions  Pending Prescriptions Disp Refills   ALPRAZolam (XANAX) 0.5 MG tablet [Pharmacy Med Name: ALPRAZOLAM 0.5MG  TABLETS] 20 tablet     Sig: TAKE 1 TABLET BY MOUTH DAILY AS NEEDED FOR ANXIETY      Not Delegated - Psychiatry:  Anxiolytics/Hypnotics Failed - 08/19/2020 12:00 PM      Failed - This refill cannot be delegated      Failed - Urine Drug Screen completed in last 360 days.      Passed - Valid encounter within last 6 months    Recent Outpatient Visits           1 month ago Cervical radiculitis   [facility] Steele Sizer, MD   5 months ago Benign hypertension   [facility] [place], Drue Stager, MD   12 months ago CKD (chronic kidney disease), stage III [facility])   [facility] Steele Sizer, MD   1 year ago Well woman exam   [facility] Steele Sizer, MD   1 year ago Neck pain on right side   [place], [place]       Future Appointments             In 2 weeks Steele Sizer, MD [facility], [place]   In 3 weeks  [place]

## 2020-08-20 ENCOUNTER — Encounter: Payer: Self-pay | Admitting: Family Medicine

## 2020-08-31 DIAGNOSIS — H401132 Primary open-angle glaucoma, bilateral, moderate stage: Secondary | ICD-10-CM | POA: Diagnosis not present

## 2020-09-02 NOTE — Progress Notes (Signed)
Name: Dawn Fuentes   MRN: [id number]    DOB: 25-Jun-1946   Date:09/04/2020       Progress Note  Subjective  Chief Complaint  Follow up   HPI  GAD: she is feeling much better on Citalopram 10 mg, very seldom  Takes alprazolam now. She is happy with medications , she needs refill today   HTN: bp at home is 120's Currently on Losartan 50 mg and no side effects. She denies chest pain or palpitation, denies edema, no dizziness.BP is very high today, she took meloxicam before she came in. We will stop nsaid's today, we will try hydralazine prn, monitor at home nad return in 1 week for bp check only   CKI: we will monitor, sheisavoiding takingNSAID's, she was advised to stop Meloxicam today since bp is very high.   OA knee: she has noticed worsening of pain on right knee when going up and down stairs and when first standing up from sitting position, both knees but right is worse than left. No effusion or redness, and is no limiting her activity level. She went to Ortho, had some PT andwas feeling better, still has pain going up and down stairs. She is doing okay at this time, no pain or effusion Currently no pain   Thumb pain/bilaterally going on for months, she plays games on her phone and texts, no edema or redness, just sore when using her thumb, she will consider going to ortho if symptoms gets worse. Pain is described as aching but currently pain is 0/10   Insomnia: she is taking Ambien5 mg at night, it is helping her fall and stay asleep, she has been doing well on medication , we give her medication for 6 months   GERD: unable to wean self off PPI, she is back on it daily, no heartburn of indigestion as long as she takes medication.Stable, she states after dinner she has to clear her throat, she has seen gi and will have EGD and colonoscopy in a couple of weeks.   Osteoporosis: she took fosamax long time ago, and is afraid to go back on therapy,shegets prolia, had a  fracture of right foot in 2020 had to wear a boot , she still has intermittent pain on right foot. Reminded her to have a high calcium diet and vitamin D supplementation   Hypothyroidism: she is taking medication as prescribed, no hair loss, palpitation or change in bowel movements Last TSH at goal   DDD cervical spine with radiculitis, pain going down on right arm has improved, taking meloxicam, gabapentin at night and baclofen, pain on her neck is no longer constant, but bp is very high today, and she just took meloxicam, we will stop nsaid's discussed referral to psyatrist, she states since she is feeling better she would like to hold off for now   Patient Active Problem List   Diagnosis Date Noted  . DDD (degenerative disc disease), cervical 06/25/2020  . Lichen sclerosus et atrophicus of the vulva 11/04/2019  . [name] deficiency 12/26/2018  . Osteoarthritis of knees, bilateral 05/26/2017  . Primary open angle glaucoma of both eyes 11/21/2016  . Chronic kidney disease (CKD), stage III (moderate) 04/06/2016  . Cystitis 10/12/2015  . Benign hypertension 08/21/2015  . Insomnia, persistent 08/21/2015  . Dyslipidemia 08/21/2015  . Gastro-esophageal reflux disease without esophagitis 08/21/2015  . Glaucoma 08/21/2015  . Blood glucose elevated 08/21/2015  . Adult hypothyroidism 08/21/2015  . Climacteric 08/21/2015  . Senile osteoporosis 08/21/2015  .  Seasonal allergies 08/21/2015  . Female genuine stress incontinence 08/21/2015  . Vitamin D deficiency 08/21/2015    Past Surgical History:  Procedure Laterality Date  . ABDOMINAL HYSTERECTOMY     21 years ago  . APPENDECTOMY    . COLONOSCOPY WITH PROPOFOL N/A 05/29/2015   Procedure: COLONOSCOPY WITH PROPOFOL;  Surgeon: Hulen Luster, MD;  Location: [facility] ENDOSCOPY;  Service: Gastroenterology;  Laterality: N/A;  . ESOPHAGOGASTRODUODENOSCOPY N/A 05/29/2015   Procedure: ESOPHAGOGASTRODUODENOSCOPY (EGD);  Surgeon: Hulen Luster, MD;  Location: [facility]  ENDOSCOPY;  Service: Gastroenterology;  Laterality: N/A;  . EYE SURGERY     eye lids  . OVARIAN CYST REMOVAL     needed transfusion  . PHOTOCOAGULATION WITH LASER Right 11/21/2016   Procedure: PHOTOCOAGULATION WITH LASER;  Surgeon: Ronnell Freshwater, MD;  Location: [place];  Service: Ophthalmology;  Laterality: Right;  A micropulse probe was applied to each hemilimbus with the following settings: 2084mW, 31.3% duty cycle, 90 seconds x 3 applications.     Family History  Problem Relation Age of Onset  . Hypertension Mother   . Osteoporosis Mother   . COPD Mother   . Dementia Mother   . Cancer Father        Colon  . Hypothyroidism Daughter   . Aneurysm Brother   . COPD Sister     Social History   Tobacco Use  . Smoking status: Never Smoker  . Smokeless tobacco: Never Used  . Tobacco comment: smoking cessation materials not required  Substance Use Topics  . Alcohol use: No    Alcohol/week: 0.0 standard drinks     Current Outpatient Medications:  .  ALPRAZolam (XANAX) 0.5 MG tablet, Take 1 tablet (0.5 mg total) by mouth daily as needed for anxiety. Rarely, Disp: 20 tablet, Rfl: 0 .  aspirin 81 MG tablet, Take 81 mg by mouth daily. pm, Disp: , Rfl:  .  baclofen (LIORESAL) 10 MG tablet, Take 1 tablet (10 mg total) by mouth 3 (three) times daily as needed for muscle spasms. It may make you sleepy - do not drive, Disp: 90 each, Rfl: 0 .  cholecalciferol (VITAMIN D) 1000 units tablet, Take 1,000 Units by mouth 2 (two) times daily. am, Disp: , Rfl:  .  citalopram (CELEXA) 10 MG tablet, Take 1 tablet (10 mg total) by mouth daily., Disp: 90 tablet, Rfl: 1 .  clobetasol ointment (TEMOVATE) 0.05 %, Apply to affected area every night for 4 weeks, then every other day for 4 weeks and then twice a week for 4 weeks or until resolution., Disp: 30 g, Rfl: 5 .  denosumab (PROLIA) 60 MG/ML SOSY injection, Inject into the skin every 6 (six) months. , Disp: , Rfl:  .  fluticasone  (FLONASE) 50 MCG/ACT nasal spray, Place 2 sprays into both nostrils daily. Pm, Disp: 16 g, Rfl: 2 .  gabapentin (NEURONTIN) 300 MG capsule, Take 1 capsule (300 mg total) by mouth at bedtime. Start with 1 and go up as tolerated max 300 mg at night, Disp: 90 capsule, Rfl: 1 .  levocetirizine (XYZAL) 5 MG tablet, Take 1 tablet (5 mg total) by mouth every evening., Disp: 90 tablet, Rfl: 1 .  levothyroxine (SYNTHROID) 50 MCG tablet, TAKE 1 TABLET BY MOUTH EVERY DAY BEFORE BREAKFAST AND 2 TABLETS ON SUNDAY, Disp: 102 tablet, Rfl: 0 .  losartan (COZAAR) 50 MG tablet, TAKE 1 TABLET BY MOUTH DAILY, Disp: 90 tablet, Rfl: 1 .  meloxicam (MOBIC) 15 MG tablet, TAKE 1  TABLET(15 MG) BY MOUTH DAILY WITH FOOD FOR INFLAMMATION, Disp: 90 tablet, Rfl: 0 .  montelukast (SINGULAIR) 10 MG tablet, Take 1 tablet (10 mg total) by mouth daily as needed (allergies)., Disp: 90 tablet, Rfl: 1 .  Omega-3 Fatty Acids (FISH OIL PO), Take by mouth. AM, Disp: , Rfl:  .  pantoprazole (PROTONIX) 40 MG tablet, TAKE 1 TABLET(40 MG) BY MOUTH DAILY, Disp: 90 tablet, Rfl: 2 .  simvastatin (ZOCOR) 40 MG tablet, Take 1 tablet (40 mg total) by mouth daily., Disp: 90 tablet, Rfl: 1 .  timolol (TIMOPTIC) 0.5 % ophthalmic solution, INT 1 GTT IN OU BID, Disp: , Rfl: 5 .  VYZULTA 0.024 % SOLN, INT 1 GTT INTO OU AT NIGHT, Disp: , Rfl: 2 .  zolpidem (AMBIEN) 5 MG tablet, Take qhs, Disp: 90 tablet, Rfl: 1 .  hydrALAZINE (APRESOLINE) 10 MG tablet, Take 1 tablet (10 mg total) by mouth 3 (three) times daily. If bp above 140/90, Disp: 30 tablet, Rfl: 0  Current Facility-Administered Medications:  .  cyanocobalamin ((VITAMIN B-12)) injection 1,000 mcg, 1,000 mcg, Intramuscular, Q30 days, Steele Sizer, MD, 1,000 mcg at 09/04/20 1440  Allergies  Allergen Reactions  . Brimonidine Tartrate   . Latanoprost   . Raloxifene     bone pain  . Sulfa Antibiotics Nausea Only    Intolerance rather than allergy.    I personally reviewed active problem list,  medication list, allergies, family history, social history, health maintenance with the patient/caregiver today.   ROS  Constitutional: Negative for fever or weight change.  Respiratory: Negative for cough and shortness of breath.   Cardiovascular: Negative for chest pain or palpitations.  Gastrointestinal: Negative for abdominal pain, no bowel changes.  Musculoskeletal: Negative for gait problem or joint swelling.  Skin: Negative for rash.  Neurological: Negative for dizziness or headache.  No other specific complaints in a complete review of systems (except as listed in HPI above).  Objective  Vitals:   09/04/20 1434 09/04/20 1442 09/04/20 1443  BP: (!) 170/90 (!) 170/90 (!) 170/100  Pulse: 97    Resp: 16    Temp: 98 F (36.7 C)    TempSrc: Oral    SpO2: 98%    Weight: 184 lb (83.5 kg)    Height: 5\' 5"  (1.651 m)      Body mass index is 30.62 kg/m.  Physical Exam  Constitutional: Patient appears well-developed and well-nourished.   No distress.  HEENT: head atraumatic, normocephalic, pupils equal and reactive to light,  neck supple Cardiovascular: Normal rate, regular rhythm and normal heart sounds.  No murmur heard. No BLE edema. Pulmonary/Chest: Effort normal and breath sounds normal. No respiratory distress. Abdominal: Soft.  There is no tenderness. Psychiatric: Patient has a normal mood and affect. behavior is normal. Judgment and thought content normal.  PHQ2/9: Depression screen [facility] 2/9 09/04/2020 06/24/2020 03/16/2020 08/29/2019 08/23/2019  Decreased Interest 0 0 0 0 0  Down, Depressed, Hopeless 0 0 0 0 0  PHQ - 2 Score 0 0 0 0 0  Altered sleeping - 1 1 - 0  Tired, decreased energy - 0 0 - 0  Change in appetite - 0 0 - 0  Feeling bad or failure about yourself  - 0 0 - 0  Trouble concentrating - 0 0 - 0  Moving slowly or fidgety/restless - - 0 - 0  Suicidal thoughts - 0 0 - 0  PHQ-9 Score - 1 1 - 0  Difficult doing work/chores - Not difficult  at all Not difficult  at all - -  Some recent data might be hidden    phq 9 is negative   Fall Risk: Fall Risk  09/04/2020 06/24/2020 03/16/2020 08/29/2019 08/23/2019  Falls in the past year? 0 0 0 0 0  Number falls in past yr: 0 0 0 0 0  Injury with Fall? 0 0 0 0 0  Risk for fall due to : - - - - -  Risk for fall due to: Comment - - - - -  Follow up - - - Falls prevention discussed -     Functional Status Survey: Is the patient deaf or have difficulty hearing?: No Does the patient have difficulty seeing, even when wearing glasses/contacts?: No Does the patient have difficulty concentrating, remembering, or making decisions?: No Does the patient have difficulty walking or climbing stairs?: No Does the patient have difficulty dressing or bathing?: No Does the patient have difficulty doing errands alone such as visiting a doctor's office or shopping?: No    Assessment & Plan   1. Benign hypertension  - losartan (COZAAR) 50 MG tablet; TAKE 1 TABLET BY MOUTH DAILY  Dispense: 90 tablet; Refill: 1 - hydrALAZINE (APRESOLINE) 10 MG tablet; Take 1 tablet (10 mg total) by mouth 3 (three) times daily. If bp above 140/90  Dispense: 30 tablet; Refill: 0  2. Dyslipidemia  - simvastatin (ZOCOR) 40 MG tablet; Take 1 tablet (40 mg total) by mouth daily.  Dispense: 90 tablet; Refill: 1  3. Gastro-esophageal reflux disease without esophagitis  Seeing GI   4. Need for Tdap vaccination  Not today she has medicare   5. Need for immunization against influenza  - Flu Vaccine QUAD High Dose(Fluad)  6. Encounter for screening mammogram for malignant neoplasm of breast  - MM 3D SCREEN BREAST BILATERAL  7. Insomnia, persistent  - zolpidem (AMBIEN) 5 MG tablet; Take qhs  Dispense: 90 tablet; Refill: 1  8. GAD (generalized anxiety disorder)  - citalopram (CELEXA) 10 MG tablet; Take 1 tablet (10 mg total) by mouth daily.  Dispense: 90 tablet; Refill: 1 - ALPRAZolam (XANAX) 0.5 MG tablet; Take 1 tablet (0.5 mg  total) by mouth daily as needed for anxiety. Rarely  Dispense: 20 tablet; Refill: 0  9. Cervical radiculitis  - gabapentin (NEURONTIN) 300 MG capsule; Take 1 capsule (300 mg total) by mouth at bedtime. Start with 1 and go up as tolerated max 300 mg at night  Dispense: 90 capsule; Refill: 1 - baclofen (LIORESAL) 10 MG tablet; Take 1 tablet (10 mg total) by mouth 3 (three) times daily as needed for muscle spasms. It may make you sleepy - do not drive  Dispense: 90 each; Refill: 0  10. Nonintractable episodic headache, unspecified headache type  - gabapentin (NEURONTIN) 300 MG capsule; Take 1 capsule (300 mg total) by mouth at bedtime. Start with 1 and go up as tolerated max 300 mg at night  Dispense: 90 capsule; Refill: 1  11. Vitamin [name] deficiency

## 2020-09-03 ENCOUNTER — Ambulatory Visit: Payer: PPO

## 2020-09-04 ENCOUNTER — Ambulatory Visit: Payer: PPO

## 2020-09-04 ENCOUNTER — Other Ambulatory Visit: Payer: Self-pay

## 2020-09-04 ENCOUNTER — Ambulatory Visit (INDEPENDENT_AMBULATORY_CARE_PROVIDER_SITE_OTHER): Payer: PPO | Admitting: Family Medicine

## 2020-09-04 ENCOUNTER — Encounter: Payer: Self-pay | Admitting: Family Medicine

## 2020-09-04 VITALS — BP 170/100 | HR 97 | Temp 98.0°F | Resp 16 | Ht 65.0 in | Wt 184.0 lb

## 2020-09-04 DIAGNOSIS — F411 Generalized anxiety disorder: Secondary | ICD-10-CM

## 2020-09-04 DIAGNOSIS — I10 Essential (primary) hypertension: Secondary | ICD-10-CM

## 2020-09-04 DIAGNOSIS — Z23 Encounter for immunization: Secondary | ICD-10-CM | POA: Diagnosis not present

## 2020-09-04 DIAGNOSIS — R519 Headache, unspecified: Secondary | ICD-10-CM | POA: Diagnosis not present

## 2020-09-04 DIAGNOSIS — E538 Deficiency of other specified B group vitamins: Secondary | ICD-10-CM | POA: Diagnosis not present

## 2020-09-04 DIAGNOSIS — G4700 Insomnia, unspecified: Secondary | ICD-10-CM

## 2020-09-04 DIAGNOSIS — K219 Gastro-esophageal reflux disease without esophagitis: Secondary | ICD-10-CM

## 2020-09-04 DIAGNOSIS — E785 Hyperlipidemia, unspecified: Secondary | ICD-10-CM | POA: Diagnosis not present

## 2020-09-04 DIAGNOSIS — M5412 Radiculopathy, cervical region: Secondary | ICD-10-CM

## 2020-09-04 DIAGNOSIS — Z1231 Encounter for screening mammogram for malignant neoplasm of breast: Secondary | ICD-10-CM

## 2020-09-04 MED ORDER — LOSARTAN POTASSIUM 50 MG PO TABS: ORAL_TABLET | ORAL | 1 refills | Status: DC

## 2020-09-04 MED ORDER — CITALOPRAM HYDROBROMIDE 10 MG PO TABS: 10.0000 mg | ORAL_TABLET | Freq: Every day | ORAL | 1 refills | Status: DC

## 2020-09-04 MED ORDER — BACLOFEN 10 MG PO TABS: 10.0000 mg | ORAL_TABLET | Freq: Three times a day (TID) | ORAL | 0 refills | Status: DC | PRN

## 2020-09-04 MED ORDER — GABAPENTIN 300 MG PO CAPS: 300.0000 mg | ORAL_CAPSULE | Freq: Every day | ORAL | 1 refills | Status: DC

## 2020-09-04 MED ORDER — ZOLPIDEM TARTRATE 5 MG PO TABS: ORAL_TABLET | ORAL | 1 refills | Status: DC

## 2020-09-04 MED ORDER — SIMVASTATIN 40 MG PO TABS: 40.0000 mg | ORAL_TABLET | Freq: Every day | ORAL | 1 refills | Status: DC

## 2020-09-04 MED ORDER — ALPRAZOLAM 0.5 MG PO TABS: 0.5000 mg | ORAL_TABLET | Freq: Every day | ORAL | 0 refills | Status: DC | PRN

## 2020-09-04 MED ORDER — HYDRALAZINE HCL 10 MG PO TABS: 10.0000 mg | ORAL_TABLET | Freq: Three times a day (TID) | ORAL | 0 refills | Status: DC

## 2020-09-07 ENCOUNTER — Other Ambulatory Visit: Payer: Self-pay | Admitting: Family Medicine

## 2020-09-07 ENCOUNTER — Telehealth: Payer: Self-pay | Admitting: Family Medicine

## 2020-09-07 DIAGNOSIS — M5412 Radiculopathy, cervical region: Secondary | ICD-10-CM

## 2020-09-07 DIAGNOSIS — R519 Headache, unspecified: Secondary | ICD-10-CM

## 2020-09-07 MED ORDER — GABAPENTIN 300 MG PO CAPS: 300.0000 mg | ORAL_CAPSULE | Freq: Every day | ORAL | 1 refills | Status: DC

## 2020-09-07 NOTE — Telephone Encounter (Signed)
Dr. Ancil Boozer sent in a new prescription for gabapentin stating that patient is to take 1 300 mg tablet daily at night.

## 2020-09-07 NOTE — Telephone Encounter (Signed)
Pharmacy called stating that they are needing clarification on the pts medication, gabapentin. Please advise.      [id number] [address] He[address], [place] AT [facility] OF [address]  [place] [address]  Phone: [phone] Fax: [phone]  Hours: Not open 24 hours

## 2020-09-10 ENCOUNTER — Other Ambulatory Visit
Admission: RE | Admit: 2020-09-10 | Discharge: 2020-09-10 | Disposition: A | Discharge status: Home / Self Care | Payer: PPO | Source: Ambulatory Visit | Attending: Gastroenterology | Admitting: Gastroenterology

## 2020-09-10 ENCOUNTER — Ambulatory Visit (INDEPENDENT_AMBULATORY_CARE_PROVIDER_SITE_OTHER): Payer: PPO

## 2020-09-10 ENCOUNTER — Other Ambulatory Visit: Payer: Self-pay

## 2020-09-10 VITALS — BP 117/78 | HR 75

## 2020-09-10 DIAGNOSIS — Z20822 Contact with and (suspected) exposure to covid-19: Secondary | ICD-10-CM | POA: Diagnosis not present

## 2020-09-10 DIAGNOSIS — Z01812 Encounter for preprocedural laboratory examination: Secondary | ICD-10-CM | POA: Insufficient documentation

## 2020-09-10 DIAGNOSIS — Z0000 Encounter for general adult medical examination without abnormal findings: Secondary | ICD-10-CM | POA: Diagnosis not present

## 2020-09-10 LAB — SARS CORONAVIRUS 2 (TAT 6-24 HRS): SARS Coronavirus 2: NEGATIVE

## 2020-09-10 NOTE — Patient Instructions (Signed)
[name] , Thank you for taking time to come for your Medicare Wellness Visit. I appreciate your ongoing commitment to your health goals. Please review the following plan we discussed and let me know if I can assist you in the future.   Screening recommendations/referrals: Colonoscopy: done 05/29/15. Scheduled for 09/14/20 Mammogram: done 09/10/18 Bone Density: done 09/06/18 Recommended yearly ophthalmology/optometry visit for glaucoma screening and checkup Recommended yearly dental visit for hygiene and checkup  Vaccinations: Influenza vaccine: done 09/04/20 Pneumococcal vaccine: done 03/08/17 Tdap vaccine: due  Shingles vaccine: done 07/12/18 & 11/20/18   Covid-19: done 01/31/20 & 02/21/20  Advanced directives: Please bring a copy of your health care power of attorney and living will to the office at your convenience.  Conditions/risks identified: Recommend increasing physical activity to at least 3 days per week  Next appointment: Follow up in one year for your annual wellness visit    Preventive Care 65 Years and Older, Female Preventive care refers to lifestyle choices and visits with your health care provider that can promote health and wellness. What does preventive care include?  A yearly physical exam. This is also called an annual well check.  Dental exams once or twice a year.  Routine eye exams. Ask your health care provider how often you should have your eyes checked.  Personal lifestyle choices, including:  Daily care of your teeth and gums.  Regular physical activity.  Eating a healthy diet.  Avoiding tobacco and drug use.  Limiting alcohol use.  Practicing safe sex.  Taking low-dose aspirin every day.  Taking vitamin and mineral supplements as recommended by your health care provider. What happens during an annual well check? The services and screenings done by your health care provider during your annual well check will depend on your age, overall health,  lifestyle risk factors, and family history of disease. Counseling  Your health care provider may ask you questions about your:  Alcohol use.  Tobacco use.  Drug use.  Emotional well-being.  Home and relationship well-being.  Sexual activity.  Eating habits.  History of falls.  Memory and ability to understand (cognition).  Work and work Statistician.  Reproductive health. Screening  You may have the following tests or measurements:  Height, weight, and BMI.  Blood pressure.  Lipid and cholesterol levels. These may be checked every 5 years, or more frequently if you are over 72 years old.  Skin check.  Lung cancer screening. You may have this screening every year starting at age 11 if you have a 30-pack-year history of smoking and currently smoke or have quit within the past 15 years.  Fecal occult blood test (FOBT) of the stool. You may have this test every year starting at age 72.  Flexible sigmoidoscopy or colonoscopy. You may have a sigmoidoscopy every 5 years or a colonoscopy every 10 years starting at age 72.  Hepatitis C blood test.  Hepatitis B blood test.  Sexually transmitted disease (STD) testing.  Diabetes screening. This is done by checking your blood sugar (glucose) after you have not eaten for a while (fasting). You may have this done every 1-3 years.  Bone density scan. This is done to screen for osteoporosis. You may have this done starting at age 72.  Mammogram. This may be done every 1-2 years. Talk to your health care provider about how often you should have regular mammograms. Talk with your health care provider about your test results, treatment options, and if necessary, the need for more  tests. Vaccines  Your health care provider may recommend certain vaccines, such as:  Influenza vaccine. This is recommended every year.  Tetanus, diphtheria, and acellular pertussis (Tdap, Td) vaccine. You may need a Td booster every 10 years.  Zoster  vaccine. You may need this after age 72.  Pneumococcal 13-valent conjugate (PCV13) vaccine. One dose is recommended after age 72.  Pneumococcal polysaccharide (PPSV23) vaccine. One dose is recommended after age 72. Talk to your health care provider about which screenings and vaccines you need and how often you need them. This information is not intended to replace advice given to you by your health care provider. Make sure you discuss any questions you have with your health care provider. Document Released: 01/01/2016 Document Revised: 08/24/2016 Document Reviewed: 10/06/2015 Elsevier Interactive Patient Education  2017 [place] Prevention in the Home Falls can cause injuries. They can happen to people of all ages. There are many things you can do to make your home safe and to help prevent falls. What can I do on the outside of my home?  Regularly fix the edges of walkways and driveways and fix any cracks.  Remove anything that might make you trip as you walk through a door, such as a raised step or threshold.  Trim any bushes or trees on the path to your home.  Use bright outdoor lighting.  Clear any walking paths of anything that might make someone trip, such as rocks or tools.  Regularly check to see if handrails are loose or broken. Make sure that both sides of any steps have handrails.  Any raised decks and porches should have guardrails on the edges.  Have any leaves, snow, or ice cleared regularly.  Use sand or salt on walking paths during winter.  Clean up any spills in your garage right away. This includes oil or grease spills. What can I do in the bathroom?  Use night lights.  Install grab bars by the toilet and in the tub and shower. Do not use towel bars as grab bars.  Use non-skid mats or decals in the tub or shower.  If you need to sit down in the shower, use a plastic, non-slip stool.  Keep the floor dry. Clean up any water that spills on the  floor as soon as it happens.  Remove soap buildup in the tub or shower regularly.  Attach bath mats securely with double-sided non-slip rug tape.  Do not have throw rugs and other things on the floor that can make you trip. What can I do in the bedroom?  Use night lights.  Make sure that you have a light by your bed that is easy to reach.  Do not use any sheets or blankets that are too big for your bed. They should not hang down onto the floor.  Have a firm chair that has side arms. You can use this for support while you get dressed.  Do not have throw rugs and other things on the floor that can make you trip. What can I do in the kitchen?  Clean up any spills right away.  Avoid walking on wet floors.  Keep items that you use a lot in easy-to-reach places.  If you need to reach something above you, use a strong step stool that has a grab bar.  Keep electrical cords out of the way.  Do not use floor polish or wax that makes floors slippery. If you must use wax, use non-skid floor  wax.  Do not have throw rugs and other things on the floor that can make you trip. What can I do with my stairs?  Do not leave any items on the stairs.  Make sure that there are handrails on both sides of the stairs and use them. Fix handrails that are broken or loose. Make sure that handrails are as long as the stairways.  Check any carpeting to make sure that it is firmly attached to the stairs. Fix any carpet that is loose or worn.  Avoid having throw rugs at the top or bottom of the stairs. If you do have throw rugs, attach them to the floor with carpet tape.  Make sure that you have a light switch at the top of the stairs and the bottom of the stairs. If you do not have them, ask someone to add them for you. What else can I do to help prevent falls?  Wear shoes that:  Do not have high heels.  Have rubber bottoms.  Are comfortable and fit you well.  Are closed at the toe. Do not wear  sandals.  If you use a stepladder:  Make sure that it is fully opened. Do not climb a closed stepladder.  Make sure that both sides of the stepladder are locked into place.  Ask someone to hold it for you, if possible.  Clearly mark and make sure that you can see:  Any grab bars or handrails.  First and last steps.  Where the edge of each step is.  Use tools that help you move around (mobility aids) if they are needed. These include:  Canes.  Walkers.  Scooters.  Crutches.  Turn on the lights when you go into a dark area. Replace any light bulbs as soon as they burn out.  Set up your furniture so you have a clear path. Avoid moving your furniture around.  If any of your floors are uneven, fix them.  If there are any pets around you, be aware of where they are.  Review your medicines with your doctor. Some medicines can make you feel dizzy. This can increase your chance of falling. Ask your doctor what other things that you can do to help prevent falls. This information is not intended to replace advice given to you by your health care provider. Make sure you discuss any questions you have with your health care provider. Document Released: 10/01/2009 Document Revised: 05/12/2016 Document Reviewed: 01/09/2015 Elsevier Interactive Patient Education  2017 [employer].

## 2020-09-10 NOTE — Progress Notes (Signed)
Subjective:   Dawn Fuentes is a 74 y.o. female who presents for Medicare Annual (Subsequent) preventive examination.  Virtual Visit via Telephone Note  I connected with  [name] on 09/10/20 at  3:30 PM EDT by telephone and verified that I am speaking with the correct person using two identifiers.  Medicare Annual Wellness visit completed telephonically due to Covid-19 pandemic.   Location: Patient: home Provider: [facility]   I discussed the limitations, risks, security and privacy concerns of performing an evaluation and management service by telephone and the availability of in person appointments. The patient expressed understanding and agreed to proceed.  Unable to perform video visit due to video visit attempted and failed and/or patient does not have video capability.   Some vital signs may be absent or patient reported.   Clemetine Marker, LPN    Review of Systems     Cardiac Risk Factors include: advanced age (>70men, >19 women);hypertension;dyslipidemia     Objective:    Today's Vitals   09/10/20 1623  BP: 117/78  Pulse: 75   There is no height or weight on file to calculate BMI.  Advanced Directives 09/10/2020 06/05/2020 08/29/2019 05/22/2019 01/26/2019 03/13/2018 09/06/2017  Does Patient Have a Medical Advance Directive? Yes Yes Yes Yes Yes Yes Yes  Type of Paramedic of [place];Living will [place];Living will [place];Living will [place];Out of facility DNR (pink MOST or yellow form) Living will;Healthcare Power of [place];Living will [place];Living will  Does patient want to make changes to medical advance directive? - - - - - Yes (MAU/Ambulatory/Procedural Areas - Information given) -  Copy of [place] in Chart? No - copy requested - No - copy requested No - copy requested - No - copy requested No - copy  requested  Would patient like information on creating a medical advance directive? - - - No - Guardian declined - - -    Current Medications (verified) Outpatient Encounter Medications as of 09/10/2020  Medication Sig  . ALPRAZolam (XANAX) 0.5 MG tablet Take 1 tablet (0.5 mg total) by mouth daily as needed for anxiety. Rarely  . aspirin 81 MG tablet Take 81 mg by mouth daily. pm  . baclofen (LIORESAL) 10 MG tablet Take 1 tablet (10 mg total) by mouth 3 (three) times daily as needed for muscle spasms. It may make you sleepy - do not drive  . cholecalciferol (VITAMIN D) 1000 units tablet Take 1,000 Units by mouth 2 (two) times daily. am  . citalopram (CELEXA) 10 MG tablet Take 1 tablet (10 mg total) by mouth daily.  Marland Kitchen denosumab (PROLIA) 60 MG/ML SOSY injection Inject into the skin every 6 (six) months.   . fluticasone (FLONASE) 50 MCG/ACT nasal spray Place 2 sprays into both nostrils daily. Pm  . gabapentin (NEURONTIN) 300 MG capsule Take 1 capsule (300 mg total) by mouth at bedtime.  Marland Kitchen levocetirizine (XYZAL) 5 MG tablet Take 1 tablet (5 mg total) by mouth every evening.  Marland Kitchen levothyroxine (SYNTHROID) 50 MCG tablet TAKE 1 TABLET BY MOUTH EVERY DAY BEFORE BREAKFAST AND 2 TABLETS ON SUNDAY  . losartan (COZAAR) 50 MG tablet TAKE 1 TABLET BY MOUTH DAILY  . montelukast (SINGULAIR) 10 MG tablet Take 1 tablet (10 mg total) by mouth daily as needed (allergies).  . Omega-3 Fatty Acids (FISH OIL PO) Take by mouth. AM  . pantoprazole (PROTONIX) 40 MG tablet  TAKE 1 TABLET(40 MG) BY MOUTH DAILY  . simvastatin (ZOCOR) 40 MG tablet Take 1 tablet (40 mg total) by mouth daily.  . timolol (TIMOPTIC) 0.5 % ophthalmic solution INT 1 GTT IN OU BID  . VYZULTA 0.024 % SOLN INT 1 GTT INTO OU AT NIGHT  . zolpidem (AMBIEN) 5 MG tablet Take qhs  . clobetasol ointment (TEMOVATE) 0.05 % Apply to affected area every night for 4 weeks, then every other day for 4 weeks and then twice a week for 4 weeks or until resolution.  .  hydrALAZINE (APRESOLINE) 10 MG tablet Take 1 tablet (10 mg total) by mouth 3 (three) times daily. If bp above 140/90 (Patient not taking: Reported on 09/10/2020)  . [DISCONTINUED] meloxicam (MOBIC) 15 MG tablet TAKE 1 TABLET(15 MG) BY MOUTH DAILY WITH FOOD FOR INFLAMMATION   Facility-Administered Encounter Medications as of 09/10/2020  Medication  . cyanocobalamin ((VITAMIN B-12)) injection 1,000 mcg    Allergies (verified) Brimonidine tartrate, Latanoprost, Meloxicam, Raloxifene, and Sulfa antibiotics   History: Past Medical History:  Diagnosis Date  . Allergy   . Anxiety    ER visit in Sept 2017 for anxiety  . Arthritis   . Chronic insomnia   . Colon adenoma   . Dyslipidemia   . Dysphagia   . Female stress incontinence   . GERD (gastroesophageal reflux disease)   . [facility] both [facility]  . Hyperlipidemia   . Hypertension    controlled on meds  . Hypothyroidism   . Menopause syndrome   . Osteoporosis   . Polyp of colon   . Renal insufficiency    mild- keeping an eye on  . Vitamin D deficiency    Past Surgical History:  Procedure Laterality Date  . ABDOMINAL HYSTERECTOMY     21 years ago  . APPENDECTOMY    . COLONOSCOPY WITH PROPOFOL N/A 05/29/2015   Procedure: COLONOSCOPY WITH PROPOFOL;  Surgeon: Hulen Luster, MD;  Location[facility] ENDOSCOPY;  Service: Gastroenterology;  Laterality: N/A;  . ESOPHAGOGASTRODUODENOSCOPY N/A 05/29/2015   Procedure: ESOPHAGOGASTRODUODENOSCOPY (EGD);  Surgeon: Hulen Luster, MD;  Location: [facility] ENDOSCOPY;  Service: Gastroenterology;  Laterality: N/A;  . EYE SURGERY     eye lids  . OVARIAN CYST REMOVAL     needed transfusion  . PHOTOCOAGULATION WITH LASER Right 11/21/2016   Procedure: PHOTOCOAGULATION WITH LASER;  Surgeon: Ronnell Freshwater, MD;  Location: [facility];  Service: Ophthalmology;  Laterality: Right;  A micropulse probe was applied to each hemilimbus with the following settings: 2058mW,  31.3% duty cycle, 90 seconds x 3 applications.    Family History  Problem Relation Age of Onset  . Hypertension Mother   . Osteoporosis Mother   . COPD Mother   . Dementia Mother   . Cancer Father        Colon  . Hypothyroidism Daughter   . Aneurysm Brother   . COPD Sister    Social History   Socioeconomic History  . Marital status: Widowed    Spouse name: Shanon Brow  . Number of children: 1  . Years of education: Not on file  . Highest education level: 12th grade  Occupational History  . Occupation: Retired  Tobacco Use  . Smoking status: Never Smoker  . Smokeless tobacco: Never Used  . Tobacco comment: smoking cessation materials not required  Vaping Use  . Vaping Use: Never used  Substance and Sexual Activity  . Alcohol use: No  Alcohol/week: 0.0 standard drinks  . Drug use: No  . Sexual activity: Yes    Partners: Male    Birth control/protection: Post-menopausal  Other Topics Concern  . Not on file  Social History Narrative   Widow since 2013   Started to date in 2019   Social Determinants of Health   Financial Resource Strain: Low Risk   . Difficulty of Paying Living Expenses: Not hard at all  Food Insecurity: No Food Insecurity  . Worried About Charity fundraiser in the Last Year: Never true  . Ran Out of Food in the Last Year: Never true  Transportation Needs: No Transportation Needs  . Lack of Transportation (Medical): No  . Lack of Transportation (Non-Medical): No  Physical Activity: Inactive  . Days of Exercise per Week: 0 days  . Minutes of Exercise per Session: 0 min  Stress: No Stress Concern Present  . Feeling of Stress : Only a little  Social Connections: Unknown  . Frequency of Communication with Friends and Family: Not on file  . Frequency of Social Gatherings with Friends and Family: Not on file  . Attends Religious Services: Not on file  . Active Member of Clubs or Organizations: Not on file  . Attends Archivist Meetings: Not  on file  . Marital Status: Widowed    Tobacco Counseling Counseling given: Not Answered Comment: smoking cessation materials not required   Clinical Intake:  Pre-visit preparation completed: Yes  Pain : No/denies pain     Nutritional Risks: None Diabetes: No  How often do you need to have someone help you when you read instructions, pamphlets, or other written materials from your doctor or pharmacy?: 1 - Never    Interpreter Needed?: No  Information entered by :: Clemetine Marker LPN   Activities of Daily Living In your present state of health, do you have any difficulty performing the following activities: 09/10/2020 09/04/2020  Hearing? N N  Comment declines hearing aids -  Vision? N N  Difficulty concentrating or making decisions? N N  Walking or climbing stairs? N N  Dressing or bathing? N N  Doing errands, shopping? N N  Preparing Food and eating ? N -  Using the Toilet? N -  In the past six months, have you accidently leaked urine? Y -  Comment wears pads for protection -  Do you have problems with loss of bowel control? N -  Managing your Medications? N -  Managing your Finances? N -  Housekeeping or managing your Housekeeping? N -  Some recent data might be hidden    Patient Care Team: Steele Sizer, MD as PCP - General (Family Medicine) Neldon Labella, RN as Case Manager  Indicate any recent Medical Services you may have received from other than [facility] providers in the past year (date may be approximate).     Assessment:   This is a routine wellness examination for [place].  Hearing/Vision screen  Hearing Screening   125Hz  250Hz  500Hz  1000Hz  2000Hz  3000Hz  4000Hz  6000Hz  8000Hz   Right ear:           Left ear:           Comments: Pt denies hearing difficulty  Vision Screening Comments: Annual vision screenings done at [facility] Dr. Wallace Going  Dietary issues and exercise activities discussed: Current Exercise Habits: The patient does not  participate in regular exercise at present, Exercise limited by: orthopedic condition(s)  Goals    . DIET - INCREASE  WATER INTAKE     Recommend to drink at least 6-8 8oz glasses of water per day.    . Increase physical activity     Recommend increasing physical activity to at least 3 days per week      Depression Screen [facility] 2/9 Scores 09/10/2020 09/04/2020 06/24/2020 03/16/2020 08/29/2019 08/23/2019 05/24/2019  PHQ - 2 Score 0 0 0 0 0 0 0  PHQ- 9 Score - - 1 1 - 0 1    Fall Risk Fall Risk  09/10/2020 09/04/2020 06/24/2020 03/16/2020 08/29/2019  Falls in the past year? 0 0 0 0 0  Number falls in past yr: 0 0 0 0 0  Injury with Fall? 0 0 0 0 0  Risk for fall due to : No Fall Risks - - - -  Risk for fall due to: Comment - - - - -  Follow up Follow up appointment - - - Falls prevention discussed    Any stairs in or around the home? Yes  If so, are there any without handrails? No  Home free of loose throw rugs in walkways, pet beds, electrical cords, etc? Yes  Adequate lighting in your home to reduce risk of falls? Yes   ASSISTIVE DEVICES UTILIZED TO PREVENT FALLS:  Life alert? No  Use of a cane, walker or w/c? No  Grab bars in the bathroom? No  Shower chair or bench in shower? Yes  Elevated toilet seat or a handicapped toilet? Yes   TIMED UP AND GO:  Was the test performed? No . Telephonic visit.   Cognitive Function: 6CIT deferred for 2021 AWV; pt states no memory issues      6CIT Screen 08/29/2019 03/13/2018  What Year? 0 points 0 points  What month? 0 points 0 points  What time? 0 points 0 points  Count back from 20 0 points 0 points  Months in reverse 0 points 0 points  Repeat phrase 0 points 0 points  Total Score 0 0    Immunizations Immunization History  Administered Date(s) Administered  . Fluad Quad(high Dose 65+) 08/23/2019, 09/04/2020  . Influenza Split 10/02/2008, 08/18/2010, 09/20/2012  . Influenza, High Dose Seasonal PF 08/25/2015, 09/07/2016, 09/06/2017,  09/11/2018  . Influenza, Seasonal, Injecte, Preservative Fre 10/09/2014  . Influenza-Unspecified 10/09/2014  . PFIZER SARS-COV-2 Vaccination 01/31/2020, 02/21/2020  . Pneumococcal Conjugate-13 08/18/2010, 03/08/2017  . Pneumococcal Polysaccharide-23 12/30/2013  . Pneumococcal-Unspecified 08/18/2010  . Td 08/18/2010  . Tdap 08/18/2010  . Zoster 07/24/2012  . Zoster Recombinat (Shingrix) 07/12/2018, 11/20/2018    TDAP status: Due, Education has been provided regarding the importance of this vaccine. Advised may receive this vaccine at local pharmacy or Health Dept. Aware to provide a copy of the vaccination record if obtained from local pharmacy or Health Dept. Verbalized acceptance and understanding.   Flu Vaccine status: Up to date   Pneumococcal vaccine status: Up to date   Covid-19 vaccine status: Completed vaccines  Qualifies for Shingles Vaccine? Yes   Zostavax completed Yes   Shingrix Completed?: Yes  Screening Tests Health Maintenance  Topic Date Due  . MAMMOGRAM  09/11/2019  . TETANUS/TDAP  08/18/2020  . COLONOSCOPY  06/24/2021 (Originally 05/28/2020)  . INFLUENZA VACCINE  Completed  . DEXA SCAN  Completed  . COVID-19 Vaccine  Completed  . Hepatitis C Screening  Completed  . PNA vac Low Risk Adult  Completed    Health Maintenance  Health Maintenance Due  Topic Date Due  . MAMMOGRAM  09/11/2019  . TETANUS/TDAP  08/18/2020    Colorectal cancer screening: Completed 05/29/15. Repeat every 5 years scheduled for 09/14/20  Mammogram status: Completed 09/10/18. Repeat every year   Bone Density status: Completed 09/06/18. Results reflect: Bone density results: OSTEOPOROSIS. Repeat every 2 years. ordered by endocrinology.   Lung Cancer Screening: (Low Dose CT Chest recommended if Age 31-80 years, 30 pack-year currently smoking OR have quit w/in 15years.) does not qualify.    Additional Screening:  Hepatitis C Screening: does qualify; Completed 02/14/13  Vision  Screening: Recommended annual ophthalmology exams for early detection of glaucoma and other disorders of the eye. Is the patient up to date with their annual eye exam?  Yes  Who is the provider or what is the name of the office in which the patient attends annual eye exams? [place] Screening: Recommended annual dental exams for proper oral hygiene  Community Resource Referral / Chronic Care Management: CRR required this visit?  No   CCM required this visit?  No      Plan:     I have personally reviewed and noted the following in the patient's chart:   . Medical and social history . Use of alcohol, tobacco or illicit drugs  . Current medications and supplements . Functional ability and status . Nutritional status . Physical activity . Advanced directives . List of other physicians . Hospitalizations, surgeries, and ER visits in previous 12 months . Vitals . Screenings to include cognitive, depression, and falls . Referrals and appointments  In addition, I have reviewed and discussed with patient certain preventive protocols, quality metrics, and best practice recommendations. A written personalized care plan for preventive services as well as general preventive health recommendations were provided to patient.     Clemetine Marker, LPN   [id number]   Nurse Notes: pt states at home BP readings have been WNL since d/c meloxicam. Pt scheduled for BP nurse visit tomorrow.

## 2020-09-11 ENCOUNTER — Ambulatory Visit: Payer: PPO

## 2020-09-11 ENCOUNTER — Encounter: Payer: Self-pay | Admitting: *Deleted

## 2020-09-11 VITALS — BP 130/90 | HR 87 | Resp 16

## 2020-09-11 DIAGNOSIS — I10 Essential (primary) hypertension: Secondary | ICD-10-CM

## 2020-09-11 NOTE — Progress Notes (Signed)
Patient is here for a blood pressure check. Patient denies chest pain, palpitations, shortness of breath or visual disturbances. At previous visit blood pressure was 170/100 with a heart rate of 97. Today during nurse visit first check blood pressure was 134/88 with a heart rate of 87 . After 5 minutes her BP was 130/90. She does take blood pressure medications.

## 2020-09-14 ENCOUNTER — Encounter
Admission: RE | Disposition: A | Discharge status: Home / Self Care | Payer: Self-pay | Source: Home / Self Care | Attending: Gastroenterology

## 2020-09-14 ENCOUNTER — Ambulatory Visit: Payer: PPO | Admitting: Anesthesiology

## 2020-09-14 ENCOUNTER — Ambulatory Visit
Admission: RE | Admit: 2020-09-14 | Discharge: 2020-09-14 | Disposition: A | Discharge status: Home / Self Care | Payer: PPO | Attending: Gastroenterology | Admitting: Gastroenterology

## 2020-09-14 ENCOUNTER — Other Ambulatory Visit: Payer: Self-pay

## 2020-09-14 ENCOUNTER — Encounter: Payer: Self-pay | Admitting: *Deleted

## 2020-09-14 DIAGNOSIS — Z86010 Personal history of colonic polyps: Secondary | ICD-10-CM | POA: Insufficient documentation

## 2020-09-14 DIAGNOSIS — E039 Hypothyroidism, unspecified: Secondary | ICD-10-CM | POA: Insufficient documentation

## 2020-09-14 DIAGNOSIS — E785 Hyperlipidemia, unspecified: Secondary | ICD-10-CM | POA: Diagnosis not present

## 2020-09-14 DIAGNOSIS — Z1211 Encounter for screening for malignant neoplasm of colon: Secondary | ICD-10-CM | POA: Diagnosis not present

## 2020-09-14 DIAGNOSIS — H409 Unspecified glaucoma: Secondary | ICD-10-CM | POA: Diagnosis not present

## 2020-09-14 DIAGNOSIS — K219 Gastro-esophageal reflux disease without esophagitis: Secondary | ICD-10-CM | POA: Insufficient documentation

## 2020-09-14 DIAGNOSIS — I10 Essential (primary) hypertension: Secondary | ICD-10-CM | POA: Insufficient documentation

## 2020-09-14 DIAGNOSIS — Z888 Allergy status to other drugs, medicaments and biological substances status: Secondary | ICD-10-CM | POA: Diagnosis not present

## 2020-09-14 DIAGNOSIS — R1310 Dysphagia, unspecified: Secondary | ICD-10-CM | POA: Diagnosis present

## 2020-09-14 DIAGNOSIS — K640 First degree hemorrhoids: Secondary | ICD-10-CM | POA: Insufficient documentation

## 2020-09-14 DIAGNOSIS — Z800 Family history of malignant neoplasm of digestive organs: Secondary | ICD-10-CM | POA: Insufficient documentation

## 2020-09-14 DIAGNOSIS — F419 Anxiety disorder, unspecified: Secondary | ICD-10-CM | POA: Diagnosis not present

## 2020-09-14 DIAGNOSIS — Z882 Allergy status to sulfonamides status: Secondary | ICD-10-CM | POA: Diagnosis not present

## 2020-09-14 DIAGNOSIS — K449 Diaphragmatic hernia without obstruction or gangrene: Secondary | ICD-10-CM | POA: Diagnosis not present

## 2020-09-14 DIAGNOSIS — Z7982 Long term (current) use of aspirin: Secondary | ICD-10-CM | POA: Diagnosis not present

## 2020-09-14 DIAGNOSIS — Z79890 Hormone replacement therapy (postmenopausal): Secondary | ICD-10-CM | POA: Diagnosis not present

## 2020-09-14 DIAGNOSIS — M1990 Unspecified osteoarthritis, unspecified site: Secondary | ICD-10-CM | POA: Insufficient documentation

## 2020-09-14 DIAGNOSIS — K222 Esophageal obstruction: Secondary | ICD-10-CM | POA: Diagnosis not present

## 2020-09-14 DIAGNOSIS — Z79899 Other long term (current) drug therapy: Secondary | ICD-10-CM | POA: Insufficient documentation

## 2020-09-14 DIAGNOSIS — K649 Unspecified hemorrhoids: Secondary | ICD-10-CM | POA: Diagnosis not present

## 2020-09-14 HISTORY — PX: ESOPHAGOGASTRODUODENOSCOPY: SHX5428

## 2020-09-14 HISTORY — PX: COLONOSCOPY: SHX5424

## 2020-09-14 SURGERY — ESOPHAGOGASTRODUODENOSCOPY (EGD)
Anesthesia: General

## 2020-09-14 MED ORDER — SODIUM CHLORIDE 0.9 % IV SOLN: INTRAVENOUS | Status: DC

## 2020-09-14 MED ORDER — PROPOFOL 500 MG/50ML IV EMUL
INTRAVENOUS | Status: AC
  Filled 2020-09-14: qty 50

## 2020-09-14 MED ORDER — LIDOCAINE HCL (CARDIAC) PF 100 MG/5ML IV SOSY
PREFILLED_SYRINGE | INTRAVENOUS | Status: DC | PRN
  Administered 2020-09-14: 50 mg via INTRAVENOUS

## 2020-09-14 MED ORDER — PROPOFOL 500 MG/50ML IV EMUL
INTRAVENOUS | Status: DC | PRN
  Administered 2020-09-14: 125 ug/kg/min via INTRAVENOUS

## 2020-09-14 MED ORDER — LIDOCAINE HCL (PF) 2 % IJ SOLN
INTRAMUSCULAR | Status: AC
  Filled 2020-09-14: qty 5

## 2020-09-14 NOTE — Anesthesia Procedure Notes (Signed)
Performed by: Vaughan Sine Pre-anesthesia Checklist: Patient identified, Emergency Drugs available, Suction available, Patient being monitored and Timeout performed Patient Re-evaluated:Patient Re-evaluated prior to induction Oxygen Delivery Method: Nasal cannula Preoxygenation: Pre-oxygenation with 100% oxygen Induction Type: IV induction

## 2020-09-14 NOTE — Anesthesia Postprocedure Evaluation (Signed)
Anesthesia Post Note  Patient: Dawn Fuentes  Procedure(s) Performed: ESOPHAGOGASTRODUODENOSCOPY (EGD) (N/A ) COLONOSCOPY (N/A )  Patient location during evaluation: Endoscopy Anesthesia Type: General Level of consciousness: awake and alert Pain management: pain level controlled Vital Signs Assessment: post-procedure vital signs reviewed and stable Respiratory status: spontaneous breathing and respiratory function stable Cardiovascular status: stable Anesthetic complications: no   No complications documented.   Last Vitals:  Vitals:   09/14/20 0840 09/14/20 0850  BP: 113/84 104/86  Pulse: 76 88  Resp: 13 14  Temp: (!) 36.3 C   SpO2: 95% 99%    Last Pain:  Vitals:   09/14/20 0840  TempSrc: Tympanic  PainSc:                  Demica Zook K

## 2020-09-14 NOTE — Anesthesia Preprocedure Evaluation (Signed)
Anesthesia Evaluation  Patient identified by MRN, date of birth, ID band Patient awake    Reviewed: Allergy & Precautions, NPO status , Patient's Chart, lab work & pertinent test results  History of Anesthesia Complications Negative for: history of anesthetic complications  Airway Mallampati: II       Dental   Pulmonary neg sleep apnea, neg COPD, Not current smoker,           Cardiovascular hypertension, Pt. on medications (-) Past MI and (-) CHF (-) dysrhythmias (-) Valvular Problems/Murmurs     Neuro/Psych neg Seizures Anxiety    GI/Hepatic Neg liver ROS, GERD  Medicated and Controlled,  Endo/Other  neg diabetesHypothyroidism   Renal/GU Renal InsufficiencyRenal disease     Musculoskeletal   Abdominal   Peds  Hematology   Anesthesia Other Findings   Reproductive/Obstetrics                             Anesthesia Physical Anesthesia Plan  ASA: II  Anesthesia Plan: General   Post-op Pain Management:    Induction: Intravenous  PONV Risk Score and Plan: Propofol infusion, TIVA and Treatment may vary due to age or medical condition  Airway Management Planned: Nasal Cannula  Additional Equipment:   Intra-op Plan:   Post-operative Plan:   Informed Consent: I have reviewed the patients History and Physical, chart, labs and discussed the procedure including the risks, benefits and alternatives for the proposed anesthesia with the patient or authorized representative who has indicated his/her understanding and acceptance.       Plan Discussed with:   Anesthesia Plan Comments:         Anesthesia Quick Evaluation

## 2020-09-14 NOTE — Op Note (Signed)
[facility] Gastroenterology Patient Name: Dawn Fuentes Procedure Date: 09/14/2020 8:04 AM MRN: [id number] Account #: [id number] Date of Birth: 1946-12-10 Admit Type: Outpatient Age: 74 Room: [facility] ENDO ROOM 2 Gender: Female Note Status: Finalized Procedure:             Upper GI endoscopy Indications:           Dysphagia Providers:             Andrey Farmer MD, MD Referring MD:          Bethena Roys. Sowles, MD (Referring MD) Medicines:             Monitored Anesthesia Care Complications:         No immediate complications. Estimated blood loss:                         Minimal. Procedure:             Pre-Anesthesia Assessment:                        - Prior to the procedure, a History and Physical was                         performed, and patient medications and allergies were                         reviewed. The patient is competent. The risks and                         benefits of the procedure and the sedation options and                         risks were discussed with the patient. All questions                         were answered and informed consent was obtained.                         Patient identification and proposed procedure were                         verified by the physician, the nurse, the anesthetist                         and the technician in the endoscopy suite. Mental                         Status Examination: alert and oriented. Airway                         Examination: normal oropharyngeal airway and neck                         mobility. Respiratory Examination: clear to                         auscultation. CV Examination: normal. Prophylactic                         Antibiotics:  The patient does not require prophylactic                         antibiotics. Prior Anticoagulants: The patient has                         taken no previous anticoagulant or antiplatelet                         agents. ASA Grade Assessment: II - A  patient with mild                         systemic disease. After reviewing the risks and                         benefits, the patient was deemed in satisfactory                         condition to undergo the procedure. The anesthesia                         plan was to use monitored anesthesia care (MAC).                         Immediately prior to administration of medications,                         the patient was re-assessed for adequacy to receive                         sedatives. The heart rate, respiratory rate, oxygen                         saturations, blood pressure, adequacy of pulmonary                         ventilation, and response to care were monitored                         throughout the procedure. The physical status of the                         patient was re-assessed after the procedure.                        After obtaining informed consent, the endoscope was                         passed under direct vision. Throughout the procedure,                         the patient's blood pressure, pulse, and oxygen                         saturations were monitored continuously. The Endoscope                         was introduced through the mouth, and advanced to the  second part of duodenum. The upper GI endoscopy was                         accomplished without difficulty. The patient tolerated                         the procedure well. Findings:      A non-obstructing Schatzki ring was found in the lower third of the       esophagus. A TTS dilator was passed through the scope. Dilation with a       15-16.5-18 mm balloon dilator was performed to 18 mm. The dilation site       was examined and showed mild mucosal disruption. Estimated blood loss       was minimal.      A medium-sized hiatal hernia was present.      The entire examined stomach was normal.      The examined duodenum was normal. Impression:            - Non-obstructing  Schatzki ring. Dilated.                        - Normal stomach.                        - Medium-sized hiatal hernia.                        - Normal examined duodenum.                        - No specimens collected. Recommendation:        - Discharge patient to home.                        - Resume previous diet.                        - Continue present medications.                        - Return to referring physician as previously                         scheduled. Procedure Code(s):     --- Professional ---                        [id number], Esophagogastroduodenoscopy, flexible,                         transoral; with transendoscopic balloon dilation of                         esophagus (less than 30 mm diameter) Diagnosis Code(s):     --- Professional ---                        K22.2, Esophageal obstruction                        K44.9, Diaphragmatic hernia without obstruction or  gangrene                        R13.10, Dysphagia, unspecified CPT copyright 2019 American Medical Association. All rights reserved. The codes documented in this report are preliminary and upon coder review may  be revised to meet current compliance requirements. Andrey Farmer, MD Andrey Farmer MD, MD 09/14/2020 8:44:01 AM Number of Addenda: 0 Note Initiated On: 09/14/2020 8:04 AM Estimated Blood Loss:  Estimated blood loss: none. Estimated blood loss was                         minimal.      [facility]

## 2020-09-14 NOTE — Progress Notes (Signed)
   09/14/20 0755  Clinical Encounter Type  Visited With Family  Visit Type Initial  Referral From Chaplain  Consult/Referral To Chaplain  While rounding SDS waiting area, chaplain briefly visited with pt's sister to see how she was doing were waiting. Pt's sister said all is well and she did no have any concerns. Chaplain wished her well and left.

## 2020-09-14 NOTE — Interval H&P Note (Signed)
History and Physical Interval Note:  09/14/2020 8:05 AM  Dawn Fuentes  has presented today for surgery, with the diagnosis of DYSPHAGIA,PERSONAL HX.OF COLON POLYPS.  The various methods of treatment have been discussed with the patient and family. After consideration of risks, benefits and other options for treatment, the patient has consented to  Procedure(s): ESOPHAGOGASTRODUODENOSCOPY (EGD) (N/A) COLONOSCOPY (N/A) as a surgical intervention.  The patient's history has been reviewed, patient examined, no change in status, stable for surgery.  I have reviewed the patient's chart and labs.  Questions were answered to the patient's satisfaction.     Lesly Rubenstein  Ok to proceed with EGD/Colonoscopy

## 2020-09-14 NOTE — Transfer of Care (Signed)
Immediate Anesthesia Transfer of Care Note  Patient: Dawn Fuentes  Procedure(s) Performed: ESOPHAGOGASTRODUODENOSCOPY (EGD) (N/A ) COLONOSCOPY (N/A )  Patient Location: PACU  Anesthesia Type:General  Level of Consciousness: awake and sedated  Airway & Oxygen Therapy: Patient Spontanous Breathing and Patient connected to nasal cannula oxygen  Post-op Assessment: Report given to RN and Post -op Vital signs reviewed and stable  Post vital signs: Reviewed and stable  Last Vitals:  Vitals Value Taken Time  BP    Temp    Pulse    Resp    SpO2      Last Pain:  Vitals:   09/14/20 0725  TempSrc:   PainSc: 0-No pain         Complications: No complications documented.

## 2020-09-14 NOTE — Op Note (Signed)
[facility] Gastroenterology Patient Name: Dawn Fuentes Procedure Date: 09/14/2020 8:03 AM MRN: [id number] Account #: [id number] Date of Birth: 1946-03-13 Admit Type: Outpatient Age: 74 Room: [facility] ENDO ROOM 2 Gender: Female Note Status: Finalized Procedure:             Colonoscopy Indications:           High risk colon cancer surveillance: Personal history                         of colonic polyps Providers:             Andrey Farmer MD, MD Referring MD:          Bethena Roys. Sowles, MD (Referring MD) Medicines:             Monitored Anesthesia Care Complications:         No immediate complications. Procedure:             Pre-Anesthesia Assessment:                        - Prior to the procedure, a History and Physical was                         performed, and patient medications and allergies were                         reviewed. The patient is competent. The risks and                         benefits of the procedure and the sedation options and                         risks were discussed with the patient. All questions                         were answered and informed consent was obtained.                         Patient identification and proposed procedure were                         verified by the physician, the nurse, the anesthetist                         and the technician in the endoscopy suite. Mental                         Status Examination: alert and oriented. Airway                         Examination: normal oropharyngeal airway and neck                         mobility. Respiratory Examination: clear to                         auscultation. CV Examination: normal. Prophylactic  Antibiotics: The patient does not require prophylactic                         antibiotics. Prior Anticoagulants: The patient has                         taken no previous anticoagulant or antiplatelet                         agents. ASA  Grade Assessment: II - A patient with mild                         systemic disease. After reviewing the risks and                         benefits, the patient was deemed in satisfactory                         condition to undergo the procedure. The anesthesia                         plan was to use monitored anesthesia care (MAC).                         Immediately prior to administration of medications,                         the patient was re-assessed for adequacy to receive                         sedatives. The heart rate, respiratory rate, oxygen                         saturations, blood pressure, adequacy of pulmonary                         ventilation, and response to care were monitored                         throughout the procedure. The physical status of the                         patient was re-assessed after the procedure.                        After obtaining informed consent, the colonoscope was                         passed under direct vision. Throughout the procedure,                         the patient's blood pressure, pulse, and oxygen                         saturations were monitored continuously. The                         Colonoscope was introduced through the anus and  advanced to the the cecum, identified by appendiceal                         orifice and ileocecal valve. The colonoscopy was                         performed without difficulty. The patient tolerated                         the procedure well. The quality of the bowel                         preparation was good. Findings:      The perianal and digital rectal examinations were normal.      Non-bleeding internal hemorrhoids were found during retroflexion. The       hemorrhoids were Grade I (internal hemorrhoids that do not prolapse).      The exam was otherwise without abnormality on direct and retroflexion       views. Impression:            - Non-bleeding  internal hemorrhoids.                        - The examination was otherwise normal on direct and                         retroflexion views.                        - No specimens collected. Recommendation:        - Discharge patient to home.                        - Resume previous diet.                        - Continue present medications.                        - Repeat colonoscopy in 5 years for surveillance.                        - Return to referring physician as previously                         scheduled. Procedure Code(s):     --- Professional ---                        [id number], Colorectal cancer screening; colonoscopy on                         individual at high risk Diagnosis Code(s):     --- Professional ---                        Z86.010, Personal history of colonic polyps                        K64.0, First degree hemorrhoids CPT copyright 2019 American Medical Association. All rights reserved. The codes documented in this report are preliminary and upon coder review  may  be revised to meet current compliance requirements. Andrey Farmer, MD Andrey Farmer MD, MD 09/14/2020 8:46:23 AM Number of Addenda: 0 Note Initiated On: 09/14/2020 8:03 AM Scope Withdrawal Time: 0 hours 7 minutes 0 seconds  Total Procedure Duration: 0 hours 13 minutes 16 seconds  Estimated Blood Loss:  Estimated blood loss: none.      [facility]

## 2020-09-14 NOTE — H&P (Signed)
Outpatient short stay form Pre-procedure 09/14/2020 8:03 AM Dawn Miyamoto MD, MPH  Primary Physician:  Dr. Ancil Boozer  Reason for visit:  Dysphagia/Surveillance  History of present illness:   74 y/o lady with history of polyps and family history of colon cancer and mild dysphagia here for EGD/Colonoscopy. No blood thinners. History of hysterectomy.    Current Facility-Administered Medications:  .  0.9 %  sodium chloride infusion, , Intravenous, Continuous, Justina Bertini, Hilton Cork, MD, Last Rate: 20 mL/hr at 09/14/20 0802, Continued from Pre-op at 09/14/20 0802  Facility-Administered Medications Prior to Admission  Medication Dose Route Frequency Provider Last Rate Last Admin  . cyanocobalamin ((VITAMIN B-12)) injection 1,000 mcg  1,000 mcg Intramuscular Q30 days Steele Sizer, MD   1,000 mcg at 09/04/20 1440   Medications Prior to Admission  Medication Sig Dispense Refill Last Dose  . ALPRAZolam (XANAX) 0.5 MG tablet Take 1 tablet (0.5 mg total) by mouth daily as needed for anxiety. Rarely 20 tablet 0 09/14/2020 at Unknown time  . citalopram (CELEXA) 10 MG tablet Take 1 tablet (10 mg total) by mouth daily. 90 tablet 1 09/13/2020 at Unknown time  . gabapentin (NEURONTIN) 300 MG capsule Take 1 capsule (300 mg total) by mouth at bedtime. 90 capsule 1 Past Week at Unknown time  . levothyroxine (SYNTHROID) 50 MCG tablet TAKE 1 TABLET BY MOUTH EVERY DAY BEFORE BREAKFAST AND 2 TABLETS ON SUNDAY 102 tablet 0 09/13/2020 at Unknown time  . losartan (COZAAR) 50 MG tablet TAKE 1 TABLET BY MOUTH DAILY 90 tablet 1 09/14/2020 at Unknown time  . Omega-3 Fatty Acids (FISH OIL PO) Take by mouth. AM   Past Week at Unknown time  . pantoprazole (PROTONIX) 40 MG tablet TAKE 1 TABLET(40 MG) BY MOUTH DAILY 90 tablet 2 09/13/2020 at Unknown time  . simvastatin (ZOCOR) 40 MG tablet Take 1 tablet (40 mg total) by mouth daily. 90 tablet 1 09/13/2020 at Unknown time  . aspirin 81 MG tablet Take 81 mg by mouth daily. pm     .  baclofen (LIORESAL) 10 MG tablet Take 1 tablet (10 mg total) by mouth 3 (three) times daily as needed for muscle spasms. It may make you sleepy - do not drive 90 each 0   . cholecalciferol (VITAMIN D) 1000 units tablet Take 1,000 Units by mouth 2 (two) times daily. am     . clobetasol ointment (TEMOVATE) 0.05 % Apply to affected area every night for 4 weeks, then every other day for 4 weeks and then twice a week for 4 weeks or until resolution. 30 g 5   . denosumab (PROLIA) 60 MG/ML SOSY injection Inject into the skin every 6 (six) months.      . fluticasone (FLONASE) 50 MCG/ACT nasal spray Place 2 sprays into both nostrils daily. Pm 16 g 2   . hydrALAZINE (APRESOLINE) 10 MG tablet Take 1 tablet (10 mg total) by mouth 3 (three) times daily. If bp above 140/90 (Patient not taking: Reported on 09/10/2020) 30 tablet 0   . levocetirizine (XYZAL) 5 MG tablet Take 1 tablet (5 mg total) by mouth every evening. 90 tablet 1   . montelukast (SINGULAIR) 10 MG tablet Take 1 tablet (10 mg total) by mouth daily as needed (allergies). (Patient not taking: Reported on 09/14/2020) 90 tablet 1 Not Taking at Unknown time  . timolol (TIMOPTIC) 0.5 % ophthalmic solution INT 1 GTT IN OU BID  5   . VYZULTA 0.024 % SOLN INT 1 GTT INTO OU AT  NIGHT  2   . zolpidem (AMBIEN) 5 MG tablet Take qhs 90 tablet 1      Allergies  Allergen Reactions  . Brimonidine Tartrate   . Latanoprost   . Meloxicam     Increased blood pressure   . Raloxifene     bone pain  . Sulfa Antibiotics Nausea Only    Intolerance rather than allergy.     Past Medical History:  Diagnosis Date  . Allergy   . Anxiety    ER visit in Sept 2017 for anxiety  . Arthritis   . Chronic insomnia   . Colon adenoma   . Dyslipidemia   . Dysphagia   . Female stress incontinence   . GERD (gastroesophageal reflux disease)   . [facility] both [facility]  . Hyperlipidemia   . Hypertension    controlled on meds  .  Hypothyroidism   . Menopause syndrome   . Osteoporosis   . Polyp of colon   . Renal insufficiency    mild- keeping an eye on  . Vitamin D deficiency     Review of systems:  Otherwise negative.    Physical Exam  Gen: Alert, oriented. Appears stated age.  HEENT: [place]/AT. PERRLA. Lungs: No respiratory distress Abd: soft, benign, no masses. BS+ Ext: No edema. Pulses 2+    Planned procedures: Proceed with EGD/colonoscopy. The patient understands the nature of the planned procedure, indications, risks, alternatives and potential complications including but not limited to bleeding, infection, perforation, damage to internal organs and possible oversedation/side effects from anesthesia. The patient agrees and gives consent to proceed.  Please refer to procedure notes for findings, recommendations and patient disposition/instructions.     Dawn Miyamoto MD, MPH Gastroenterology 09/14/2020  8:03 AM

## 2020-09-15 ENCOUNTER — Encounter: Payer: Self-pay | Admitting: Gastroenterology

## 2020-10-02 ENCOUNTER — Telehealth: Payer: Self-pay

## 2020-10-02 ENCOUNTER — Encounter: Payer: Self-pay | Admitting: Family Medicine

## 2020-10-02 NOTE — Telephone Encounter (Signed)
Copied from [place] [id number]. Topic: Referral - Request for Referral >> Oct 02, 2020  9:12 AM Hinda Lenis D wrote: Has patient seen PCP for this complaint? YES *If NO, is insurance requiring patient see PCP for this issue before PCP can refer them? Referral for which specialty: ARTHRITIS Preferred provider/office: N/A Reason for referral: ARTHRITIS

## 2020-10-05 ENCOUNTER — Other Ambulatory Visit: Payer: Self-pay | Admitting: Family Medicine

## 2020-10-05 DIAGNOSIS — E559 Vitamin D deficiency, unspecified: Secondary | ICD-10-CM | POA: Diagnosis not present

## 2020-10-05 DIAGNOSIS — M810 Age-related osteoporosis without current pathological fracture: Secondary | ICD-10-CM | POA: Diagnosis not present

## 2020-10-05 DIAGNOSIS — M5412 Radiculopathy, cervical region: Secondary | ICD-10-CM

## 2020-10-05 NOTE — Telephone Encounter (Signed)
Patient would like location to be in [place]. Please call her on [phone] if you are calling today.

## 2020-10-19 ENCOUNTER — Ambulatory Visit (INDEPENDENT_AMBULATORY_CARE_PROVIDER_SITE_OTHER): Payer: PPO | Admitting: Family Medicine

## 2020-10-19 ENCOUNTER — Encounter: Payer: Self-pay | Admitting: Family Medicine

## 2020-10-19 ENCOUNTER — Other Ambulatory Visit: Payer: Self-pay

## 2020-10-19 VITALS — BP 144/90 | HR 95 | Temp 97.9°F | Resp 17 | Wt 179.0 lb

## 2020-10-19 DIAGNOSIS — F411 Generalized anxiety disorder: Secondary | ICD-10-CM | POA: Diagnosis not present

## 2020-10-19 DIAGNOSIS — M5412 Radiculopathy, cervical region: Secondary | ICD-10-CM

## 2020-10-19 DIAGNOSIS — E039 Hypothyroidism, unspecified: Secondary | ICD-10-CM

## 2020-10-19 DIAGNOSIS — I10 Essential (primary) hypertension: Secondary | ICD-10-CM | POA: Diagnosis not present

## 2020-10-19 MED ORDER — LOSARTAN POTASSIUM 100 MG PO TABS: 100.0000 mg | ORAL_TABLET | Freq: Every day | ORAL | 0 refills | Status: DC

## 2020-10-19 MED ORDER — METHYLPREDNISOLONE 4 MG PO TBPK: ORAL_TABLET | ORAL | 0 refills | Status: DC

## 2020-10-19 MED ORDER — BUSPIRONE HCL 5 MG PO TABS: 5.0000 mg | ORAL_TABLET | Freq: Three times a day (TID) | ORAL | 0 refills | Status: DC | PRN

## 2020-10-19 MED ORDER — ALPRAZOLAM 0.5 MG PO TABS: 0.5000 mg | ORAL_TABLET | Freq: Every day | ORAL | 0 refills | Status: DC | PRN

## 2020-10-19 NOTE — Progress Notes (Signed)
Name: Dawn Fuentes   MRN: [id number]    DOB: Jul 11, 1946   Date:10/19/2020       Progress Note  Subjective  Chief Complaint  Follow up  HPI   HTN: she has been noticing that her bp has remained elevated over the few days. 140-s160's/80's-90's. She does not think the Hydralazine makes a difference. She is on Losartan 50 mg daily. She states she continues to be in pain and not sure if that is the cause of her bp spiking.   Cervical radiculitis: symptoms going on for months, she had X-ray c-spine that showed DDD , she has tried Baclofen - made her sleepy, stopped meloxicam because of bp elevated, gabapentin has not helped. She is noticing worsening of symptoms, having to take Tylenol daily, pain is shooting down right lateral arm into medial elbow. No weakness noticed, pain worse when rotating head to the right side. She has an appointment with Dr. Holley Raring Dec 11 th, 2021 but doesn't think she can wait any longer. We will try medrol dose pack ( discussed bp may spike), take baclofen at night time only and follow up in a few weeks with me  Anxiety: worse since the pain on neck has increased, she states her brother had aneursym of brain and her neck pain radiates to right temporal area at times . She has been taking alprazolam more often, still on citalopram, we will add Buspar to take prn    Patient Active Problem List   Diagnosis Date Noted  . DDD (degenerative disc disease), cervical 06/25/2020  . Lichen sclerosus et atrophicus of the vulva 11/04/2019  . [name] deficiency 12/26/2018  . Osteoarthritis of knees, bilateral 05/26/2017  . Primary open angle glaucoma of both eyes 11/21/2016  . Chronic kidney disease (CKD), stage III (moderate) ([place]) 04/06/2016  . Cystitis 10/12/2015  . Benign hypertension 08/21/2015  . Insomnia, persistent 08/21/2015  . Dyslipidemia 08/21/2015  . Gastro-esophageal reflux disease without esophagitis 08/21/2015  . Glaucoma 08/21/2015  . Blood glucose elevated  08/21/2015  . Adult hypothyroidism 08/21/2015  . Climacteric 08/21/2015  . Senile osteoporosis 08/21/2015  . Seasonal allergies 08/21/2015  . Female genuine stress incontinence 08/21/2015  . Vitamin D deficiency 08/21/2015    Past Surgical History:  Procedure Laterality Date  . ABDOMINAL HYSTERECTOMY     21 years ago  . APPENDECTOMY    . COLONOSCOPY N/A 09/14/2020   Procedure: COLONOSCOPY;  Surgeon: Lesly Rubenstein, MD;  Location: [facility] ENDOSCOPY;  Service: Endoscopy;  Laterality: N/A;  . COLONOSCOPY WITH PROPOFOL N/A 05/29/2015   Procedure: COLONOSCOPY WITH PROPOFOL;  Surgeon: Hulen Luster, MD;  Location: [facility] ENDOSCOPY;  Service: Gastroenterology;  Laterality: N/A;  . ESOPHAGOGASTRODUODENOSCOPY N/A 05/29/2015   Procedure: ESOPHAGOGASTRODUODENOSCOPY (EGD);  Surgeon: Hulen Luster, MD;  Location: [facility] ENDOSCOPY;  Service: Gastroenterology;  Laterality: N/A;  . ESOPHAGOGASTRODUODENOSCOPY N/A 09/14/2020   Procedure: ESOPHAGOGASTRODUODENOSCOPY (EGD);  Surgeon: Lesly Rubenstein, MD;  Location: [facility] ENDOSCOPY;  Service: Endoscopy;  Laterality: N/A;  . EYE SURGERY     eye lids  . OVARIAN CYST REMOVAL     needed transfusion  . PHOTOCOAGULATION WITH LASER Right 11/21/2016   Procedure: PHOTOCOAGULATION WITH LASER;  Surgeon: Ronnell Freshwater, MD;  Location: [place];  Service: Ophthalmology;  Laterality: Right;  A micropulse probe was applied to each hemilimbus with the following settings: 2055mW, 31.3% duty cycle, 90 seconds x 3 applications.     Family History  Problem Relation Age of Onset  . Hypertension  Mother   . Osteoporosis Mother   . COPD Mother   . Dementia Mother   . Cancer Father        Colon  . Hypothyroidism Daughter   . Aneurysm Brother   . COPD Sister     Social History   Tobacco Use  . Smoking status: Never Smoker  . Smokeless tobacco: Never Used  . Tobacco comment: smoking cessation materials not required  Substance Use Topics  . Alcohol use: No     Alcohol/week: 0.0 standard drinks     Current Outpatient Medications:  .  ALPRAZolam (XANAX) 0.5 MG tablet, Take 1 tablet (0.5 mg total) by mouth daily as needed for anxiety. Rarely, Disp: 10 tablet, Rfl: 0 .  aspirin 81 MG tablet, Take 81 mg by mouth daily. pm, Disp: , Rfl:  .  cholecalciferol (VITAMIN D) 1000 units tablet, Take 1,000 Units by mouth 2 (two) times daily. am, Disp: , Rfl:  .  citalopram (CELEXA) 10 MG tablet, Take 1 tablet (10 mg total) by mouth daily., Disp: 90 tablet, Rfl: 1 .  clobetasol ointment (TEMOVATE) 0.05 %, Apply to affected area every night for 4 weeks, then every other day for 4 weeks and then twice a week for 4 weeks or until resolution., Disp: 30 g, Rfl: 5 .  denosumab (PROLIA) 60 MG/ML SOSY injection, Inject into the skin every 6 (six) months. , Disp: , Rfl:  .  fluticasone (FLONASE) 50 MCG/ACT nasal spray, Place 2 sprays into both nostrils daily. Pm, Disp: 16 g, Rfl: 2 .  hydrALAZINE (APRESOLINE) 10 MG tablet, Take 1 tablet (10 mg total) by mouth 3 (three) times daily. If bp above 140/90, Disp: 30 tablet, Rfl: 0 .  levocetirizine (XYZAL) 5 MG tablet, Take 1 tablet (5 mg total) by mouth every evening., Disp: 90 tablet, Rfl: 1 .  levothyroxine (SYNTHROID) 50 MCG tablet, TAKE 1 TABLET BY MOUTH EVERY DAY BEFORE BREAKFAST AND 2 TABLETS ON SUNDAY, Disp: 102 tablet, Rfl: 0 .  losartan (COZAAR) 100 MG tablet, Take 1 tablet (100 mg total) by mouth daily. TAKE 1 TABLET BY MOUTH DAILY, Disp: 90 tablet, Rfl: 0 .  montelukast (SINGULAIR) 10 MG tablet, Take 1 tablet (10 mg total) by mouth daily as needed (allergies)., Disp: 90 tablet, Rfl: 1 .  Omega-3 Fatty Acids (FISH OIL PO), Take by mouth. AM, Disp: , Rfl:  .  pantoprazole (PROTONIX) 40 MG tablet, TAKE 1 TABLET(40 MG) BY MOUTH DAILY, Disp: 90 tablet, Rfl: 2 .  simvastatin (ZOCOR) 40 MG tablet, Take 1 tablet (40 mg total) by mouth daily., Disp: 90 tablet, Rfl: 1 .  timolol (TIMOPTIC) 0.5 % ophthalmic solution, INT 1 GTT  IN OU BID, Disp: , Rfl: 5 .  VYZULTA 0.024 % SOLN, INT 1 GTT INTO OU AT NIGHT, Disp: , Rfl: 2 .  zolpidem (AMBIEN) 5 MG tablet, Take qhs, Disp: 90 tablet, Rfl: 1 .  baclofen (LIORESAL) 10 MG tablet, Take 1 tablet (10 mg total) by mouth 3 (three) times daily as needed for muscle spasms. It may make you sleepy - do not drive (Patient not taking: Reported on 10/19/2020), Disp: 90 each, Rfl: 0 .  busPIRone (BUSPAR) 5 MG tablet, Take 1 tablet (5 mg total) by mouth 3 (three) times daily as needed. For anxiety, Disp: 30 tablet, Rfl: 0 .  methylPREDNISolone (MEDROL DOSEPAK) 4 MG TBPK tablet, Take as directed, Disp: 21 each, Rfl: 0  Current Facility-Administered Medications:  .  cyanocobalamin ((VITAMIN B-12)) injection 1,000  mcg, 1,000 mcg, Intramuscular, Q30 days, Steele Sizer, MD, 1,000 mcg at 09/04/20 1440  Allergies  Allergen Reactions  . Brimonidine Tartrate   . Latanoprost   . Meloxicam     Increased blood pressure   . Raloxifene     bone pain  . Sulfa Antibiotics Nausea Only    Intolerance rather than allergy.    I personally reviewed active problem list, medication list, allergies, family history, social history, health maintenance with the patient/caregiver today.   ROS  Ten systems reviewed and is negative except as mentioned in HPI   Objective  Vitals:   10/19/20 1133 10/19/20 1138  BP: (!) 158/90 (!) 144/90  Pulse: 95   Resp: 17   Temp: 97.9 F (36.6 C)   TempSrc: Oral   SpO2: 99%   Weight: 179 lb (81.2 kg)     Body mass index is 29.79 kg/m.  Physical Exam  Constitutional: Patient appears well-developed and well-nourished. No distress.  HEENT: head atraumatic, normocephalic, pupils equal and reactive to light, neck supple, but pain triggered down right arm when rotating to the right side and extending her neck, normal grip and sensation of arms Cardiovascular: Normal rate, regular rhythm and normal heart sounds.  No murmur heard. No BLE  edema. Pulmonary/Chest: Effort normal and breath sounds normal. No respiratory distress. Abdominal: Soft.  There is no tenderness. Psychiatric: Patient has a normal mood and affect. behavior is normal. Judgment and thought content normal.  Recent Results (from the past 2160 hour(s))  SARS CORONAVIRUS 2 (TAT 6-24 HRS) Nasopharyngeal Nasopharyngeal Swab     Status: None   Collection Time: 09/10/20 11:48 AM   Specimen: Nasopharyngeal Swab  Result Value Ref Range   SARS Coronavirus 2 NEGATIVE NEGATIVE    Comment: (NOTE) SARS-CoV-2 target nucleic acids are NOT DETECTED.  The SARS-CoV-2 RNA is generally detectable in upper and lower respiratory specimens during the acute phase of infection. Negative results do not preclude SARS-CoV-2 infection, do not rule out co-infections with other pathogens, and should not be used as the sole basis for treatment or other patient management decisions. Negative results must be combined with clinical observations, patient history, and epidemiological information. The expected result is Negative.  Fact Sheet for Patients: SugarRoll.be  Fact Sheet for Healthcare Providers: https://www.woods-mathews.com/  This test is not yet approved or cleared by the [place] FDA and  has been authorized for detection and/or diagnosis of SARS-CoV-2 by FDA under an Emergency Use Authorization (EUA). This EUA will remain  in effect (meaning this test can be used) for the duration of the COVID-19 declaration under Se ction 564(b)(1) of the Act, 21 U.S.C. section 360bbb-3(b)(1), unless the authorization is terminated or revoked sooner.  Performed at [facility], [place] [address] [place], [place] [address]       PHQ2/9: Depression screen [facility] 2/9 10/19/2020 09/10/2020 09/04/2020 06/24/2020 03/16/2020  Decreased Interest 0 0 0 0 0  Down, Depressed, Hopeless 0 0 0 0 0  PHQ - 2 Score 0 0 0 0 0  Altered sleeping - - - 1 1   Tired, decreased energy - - - 0 0  Change in appetite - - - 0 0  Feeling bad or failure about yourself  - - - 0 0  Trouble concentrating - - - 0 0  Moving slowly or fidgety/restless - - - - 0  Suicidal thoughts - - - 0 0  PHQ-9 Score - - - 1 1  Difficult doing work/chores - - -  Not difficult at all Not difficult at all  Some recent data might be hidden    phq 9 is negative   Fall Risk: Fall Risk  10/19/2020 09/10/2020 09/04/2020 06/24/2020 03/16/2020  Falls in the past year? 0 0 0 0 0  Number falls in past yr: 0 0 0 0 0  Injury with Fall? 0 0 0 0 0  Risk for fall due to : - No Fall Risks - - -  Risk for fall due to: Comment - - - - -  Follow up - Follow up appointment - - -     Functional Status Survey: Is the patient deaf or have difficulty hearing?: No Does the patient have difficulty seeing, even when wearing glasses/contacts?: No Does the patient have difficulty concentrating, remembering, or making decisions?: No Does the patient have difficulty walking or climbing stairs?: No Does the patient have difficulty dressing or bathing?: No Does the patient have difficulty doing errands alone such as visiting a doctor's office or shopping?: No    Assessment & Plan  1. Benign hypertension  - Microalbumin / creatinine urine ratio - COMPLETE METABOLIC PANEL WITH GFR - losartan (COZAAR) 100 MG tablet; Take 1 tablet (100 mg total) by mouth daily. TAKE 1 TABLET BY MOUTH DAILY  Dispense: 90 tablet; Refill: 0  2. Adult hypothyroidism  - TSH  3. GAD (generalized anxiety disorder)  - busPIRone (BUSPAR) 5 MG tablet; Take 1 tablet (5 mg total) by mouth 3 (three) times daily as needed. For anxiety  Dispense: 30 tablet; Refill: 0 - ALPRAZolam (XANAX) 0.5 MG tablet; Take 1 tablet (0.5 mg total) by mouth daily as needed for anxiety. Rarely  Dispense: 10 tablet; Refill: 0  4. Cervical radiculitis  - MR Cervical Spine Wo Contrast; Future - methylPREDNISolone (MEDROL DOSEPAK) 4 MG TBPK  tablet; Take as directed  Dispense: 21 each; Refill: 0

## 2020-10-19 NOTE — Patient Instructions (Signed)
DASH Eating Plan DASH stands for "Dietary Approaches to Stop Hypertension." The DASH eating plan is a healthy eating plan that has been shown to reduce high blood pressure (hypertension). It may also reduce your risk for type 2 diabetes, heart disease, and stroke. The DASH eating plan may also help with weight loss. What are tips for following this plan?  General guidelines  Avoid eating more than 2,300 mg (milligrams) of salt (sodium) a day. If you have hypertension, you may need to reduce your sodium intake to 1,500 mg a day.  Limit alcohol intake to no more than 1 drink a day for nonpregnant women and 2 drinks a day for men. One drink equals 12 oz of beer, 5 oz of wine, or 1 oz of hard liquor.  Work with your health care provider to maintain a healthy body weight or to lose weight. Ask what an ideal weight is for you.  Get at least 30 minutes of exercise that causes your heart to beat faster (aerobic exercise) most days of the week. Activities may include walking, swimming, or biking.  Work with your health care provider or diet and nutrition specialist (dietitian) to adjust your eating plan to your individual calorie needs. Reading food labels   Check food labels for the amount of sodium per serving. Choose foods with less than 5 percent of the Daily Value of sodium. Generally, foods with less than 300 mg of sodium per serving fit into this eating plan.  To find whole grains, look for the word "whole" as the first word in the ingredient list. Shopping  Buy products labeled as "low-sodium" or "no salt added."  Buy fresh foods. Avoid canned foods and premade or frozen meals. Cooking  Avoid adding salt when cooking. Use salt-free seasonings or herbs instead of table salt or sea salt. Check with your health care provider or pharmacist before using salt substitutes.  Do not fry foods. Cook foods using healthy methods such as baking, boiling, grilling, and broiling instead.  Cook with  heart-healthy oils, such as olive, canola, soybean, or sunflower oil. Meal planning  Eat a balanced diet that includes: ? 5 or more servings of fruits and vegetables each day. At each meal, try to fill half of your plate with fruits and vegetables. ? Up to 6-8 servings of whole grains each day. ? Less than 6 oz of lean meat, poultry, or fish each day. A 3-oz serving of meat is about the same size as a deck of cards. One egg equals 1 oz. ? 2 servings of low-fat dairy each day. ? A serving of nuts, seeds, or beans 5 times each week. ? Heart-healthy fats. Healthy fats called Omega-3 fatty acids are found in foods such as flaxseeds and coldwater fish, like sardines, salmon, and mackerel.  Limit how much you eat of the following: ? Canned or prepackaged foods. ? Food that is high in trans fat, such as fried foods. ? Food that is high in saturated fat, such as fatty meat. ? Sweets, desserts, sugary drinks, and other foods with added sugar. ? Full-fat dairy products.  Do not salt foods before eating.  Try to eat at least 2 vegetarian meals each week.  Eat more home-cooked food and less restaurant, buffet, and fast food.  When eating at a restaurant, ask that your food be prepared with less salt or no salt, if possible. What foods are recommended? The items listed may not be a complete list. Talk with your dietitian about   what dietary choices are best for you. Grains Whole-grain or whole-wheat bread. Whole-grain or whole-wheat pasta. Brown rice. Oatmeal. Quinoa. Bulgur. Whole-grain and low-sodium cereals. Pita bread. Low-fat, low-sodium crackers. Whole-wheat flour tortillas. Vegetables Fresh or frozen vegetables (raw, steamed, roasted, or grilled). Low-sodium or reduced-sodium tomato and vegetable juice. Low-sodium or reduced-sodium tomato sauce and tomato paste. Low-sodium or reduced-sodium canned vegetables. Fruits All fresh, dried, or frozen fruit. Canned fruit in natural juice (without  added sugar). Meat and other protein foods Skinless chicken or turkey. Ground chicken or turkey. Pork with fat trimmed off. Fish and seafood. Egg whites. Dried beans, peas, or lentils. Unsalted nuts, nut butters, and seeds. Unsalted canned beans. Lean cuts of beef with fat trimmed off. Low-sodium, lean deli meat. Dairy Low-fat (1%) or fat-free (skim) milk. Fat-free, low-fat, or reduced-fat cheeses. Nonfat, low-sodium ricotta or cottage cheese. Low-fat or nonfat yogurt. Low-fat, low-sodium cheese. Fats and oils Soft margarine without trans fats. Vegetable oil. Low-fat, reduced-fat, or light mayonnaise and salad dressings (reduced-sodium). Canola, safflower, olive, soybean, and sunflower oils. Avocado. Seasoning and other foods Herbs. Spices. Seasoning mixes without salt. Unsalted popcorn and pretzels. Fat-free sweets. What foods are not recommended? The items listed may not be a complete list. Talk with your dietitian about what dietary choices are best for you. Grains Baked goods made with fat, such as croissants, muffins, or some breads. Dry pasta or rice meal packs. Vegetables Creamed or fried vegetables. Vegetables in a cheese sauce. Regular canned vegetables (not low-sodium or reduced-sodium). Regular canned tomato sauce and paste (not low-sodium or reduced-sodium). Regular tomato and vegetable juice (not low-sodium or reduced-sodium). Pickles. Olives. Fruits Canned fruit in a light or heavy syrup. Fried fruit. Fruit in cream or butter sauce. Meat and other protein foods Fatty cuts of meat. Ribs. Fried meat. Bacon. Sausage. Bologna and other processed lunch meats. Salami. Fatback. Hotdogs. Bratwurst. Salted nuts and seeds. Canned beans with added salt. Canned or smoked fish. Whole eggs or egg yolks. Chicken or turkey with skin. Dairy Whole or 2% milk, cream, and half-and-half. Whole or full-fat cream cheese. Whole-fat or sweetened yogurt. Full-fat cheese. Nondairy creamers. Whipped toppings.  Processed cheese and cheese spreads. Fats and oils Butter. Stick margarine. Lard. Shortening. Ghee. Bacon fat. Tropical oils, such as coconut, palm kernel, or palm oil. Seasoning and other foods Salted popcorn and pretzels. Onion salt, garlic salt, seasoned salt, table salt, and sea salt. Worcestershire sauce. Tartar sauce. Barbecue sauce. Teriyaki sauce. Soy sauce, including reduced-sodium. Steak sauce. Canned and packaged gravies. Fish sauce. Oyster sauce. Cocktail sauce. Horseradish that you find on the shelf. Ketchup. Mustard. Meat flavorings and tenderizers. Bouillon cubes. Hot sauce and Tabasco sauce. Premade or packaged marinades. Premade or packaged taco seasonings. Relishes. Regular salad dressings. Where to find more information:  National Heart, Lung, and Blood Institute: www.nhlbi.nih.gov  American Heart Association: www.heart.org Summary  The DASH eating plan is a healthy eating plan that has been shown to reduce high blood pressure (hypertension). It may also reduce your risk for type 2 diabetes, heart disease, and stroke.  With the DASH eating plan, you should limit salt (sodium) intake to 2,300 mg a day. If you have hypertension, you may need to reduce your sodium intake to 1,500 mg a day.  When on the DASH eating plan, aim to eat more fresh fruits and vegetables, whole grains, lean proteins, low-fat dairy, and heart-healthy fats.  Work with your health care provider or diet and nutrition specialist (dietitian) to adjust your eating plan to your   individual calorie needs. This information is not intended to replace advice given to you by your health care provider. Make sure you discuss any questions you have with your health care provider. Document Revised: 11/17/2017 Document Reviewed: 11/28/2016 Elsevier Patient Education  2020 Elsevier Inc.  

## 2020-10-20 ENCOUNTER — Ambulatory Visit: Payer: PPO | Admitting: Internal Medicine

## 2020-10-20 DIAGNOSIS — Z1231 Encounter for screening mammogram for malignant neoplasm of breast: Secondary | ICD-10-CM | POA: Diagnosis not present

## 2020-10-20 LAB — TSH: TSH: 9.26 m[IU]/L — ABNORMAL HIGH (ref 0.40–4.50)

## 2020-10-20 LAB — COMPLETE METABOLIC PANEL WITHOUT GFR
AG Ratio: 1.8 (calc) (ref 1.0–2.5)
ALT: 54 U/L — ABNORMAL HIGH (ref 6–29)
AST: 53 U/L — ABNORMAL HIGH (ref 10–35)
Albumin: 4.8 g/dL (ref 3.6–5.1)
Alkaline phosphatase (APISO): 57 U/L (ref 37–153)
BUN/Creatinine Ratio: 13 (calc) (ref 6–22)
BUN: 15 mg/dL (ref 7–25)
CO2: 25 mmol/L (ref 20–32)
Calcium: 9.9 mg/dL (ref 8.6–10.4)
Chloride: 104 mmol/L (ref 98–110)
Creat: 1.13 mg/dL — ABNORMAL HIGH (ref 0.60–0.93)
GFR, Est African American: 56 mL/min/{1.73_m2} — ABNORMAL LOW
GFR, Est Non African American: 49 mL/min/{1.73_m2} — ABNORMAL LOW
Globulin: 2.6 g/dL (ref 1.9–3.7)
Glucose, Bld: 99 mg/dL (ref 65–99)
Potassium: 4.5 mmol/L (ref 3.5–5.3)
Sodium: 138 mmol/L (ref 135–146)
Total Bilirubin: 0.5 mg/dL (ref 0.2–1.2)
Total Protein: 7.4 g/dL (ref 6.1–8.1)

## 2020-10-20 LAB — MICROALBUMIN / CREATININE URINE RATIO
Creatinine, Urine: 15 mg/dL — ABNORMAL LOW (ref 20–275)
Microalb, Ur: <0.2 mg/dL

## 2020-10-26 ENCOUNTER — Other Ambulatory Visit: Payer: Self-pay | Admitting: Family Medicine

## 2020-10-26 ENCOUNTER — Telehealth: Payer: Self-pay

## 2020-10-26 DIAGNOSIS — E038 Other specified hypothyroidism: Secondary | ICD-10-CM

## 2020-10-26 NOTE — Telephone Encounter (Signed)
Copied from [place] [phone]. Topic: Referral - Status >> Oct 26, 2020 11:07 AM Erick Blinks wrote: Pt would like to know when her MRI is going to be scheduled. Best contact: [phone]

## 2020-10-26 NOTE — Telephone Encounter (Signed)
Requested medication (s) are due for refill today: yes  Requested medication (s) are on the active medication list: yes  Last refill:  05/13/20  Future visit scheduled:no  Notes to clinic:  MD note dated 10/20/20 states dose to be adjusted   Requested Prescriptions  Pending Prescriptions Disp Refills   levothyroxine (SYNTHROID) 50 MCG tablet [Pharmacy Med Name: LEVOTHYROXINE 0.05MG  (50MCG) TAB] 102 tablet 0    Sig: TAKE 1 TABLET BY MOUTH EVERY DAY BEFORE BREAKFAST AND 2 TABLETS ON SUNDAY      Endocrinology:  Hypothyroid Agents Failed - 10/26/2020  2:20 PM      Failed - TSH needs to be rechecked within 3 months after an abnormal result. Refill until TSH is due.      Failed - TSH in normal range and within 360 days    TSH  Date Value Ref Range Status  10/19/2020 9.26 (H) 0.40 - 4.50 mIU/L Final          Passed - Valid encounter within last 12 months    Recent Outpatient Visits           1 week ago Benign hypertension   [facility] Steele Sizer, MD   1 month ago Benign hypertension   [facility] Steele Sizer, MD   4 months ago Cervical radiculitis   [facility] Steele Sizer, MD   7 months ago Benign hypertension   [facility] Steele Sizer, MD   1 year ago CKD (chronic kidney disease), stage III [facility])   [facility] Steele Sizer, MD       Future Appointments             In 1 week Steele Sizer, MD [facility], [place]   In 1 month Steele Sizer, MD [facility], [place]   In 4 months Steele Sizer, MD [facility], [place]   In 10 months  [facility], [facility]

## 2020-11-03 DIAGNOSIS — M810 Age-related osteoporosis without current pathological fracture: Secondary | ICD-10-CM | POA: Diagnosis not present

## 2020-11-03 DIAGNOSIS — E559 Vitamin D deficiency, unspecified: Secondary | ICD-10-CM | POA: Diagnosis not present

## 2020-11-04 ENCOUNTER — Ambulatory Visit
Admission: RE | Admit: 2020-11-04 | Discharge: 2020-11-04 | Disposition: A | Discharge status: Home / Self Care | Payer: PPO | Source: Ambulatory Visit | Attending: Family Medicine | Admitting: Family Medicine

## 2020-11-04 ENCOUNTER — Other Ambulatory Visit: Payer: Self-pay

## 2020-11-04 DIAGNOSIS — M542 Cervicalgia: Secondary | ICD-10-CM | POA: Diagnosis not present

## 2020-11-04 DIAGNOSIS — M5412 Radiculopathy, cervical region: Secondary | ICD-10-CM | POA: Insufficient documentation

## 2020-11-04 NOTE — Progress Notes (Signed)
Name: Dawn Fuentes   MRN: [id number]    DOB: 1946-09-06   Date:11/05/2020       Progress Note  Subjective  Chief Complaint  Follow up   HPI    HTN: since last visit her bp has improved with higher dose Losartan from 50 mg to 100 mg, taking Hydralazine up to TID 20 mg daily, we will add HcTZ 12.5 mg to see if she can decrease frequency of taking hydralazine as often. She is still having neck pain and is also very worried about her symptoms.   Cervical radiculitis: symptoms going on for months, she had X-ray c-spine that showed DDD , she has tried Baclofen - made her sleepy, stopped meloxicam because of elevated BP, gabapentin did not help. Marland Kitchen She was noticing worsening of symptoms, with pain radiating to right side of neck and temporal area.  She has an appointment with Dr. Holley Raring Dec 11 th, 2021 but doesn't think she can wait any longer. We gave her medrol dose pain and symptoms resolved for a few days but have been back since. MRI showed multi level DDD with moderate disc space narrowing greater at C6-7. She will keep appointment with Dr. Holley Raring, but we will also refer her to neurosurgeon just in case surgery is a better option. She is tired of feeling pain, she is willing to try Lyrica   Anxiety: worse since the pain on neck has increased, she states her brother had aneursym of brain and her neck pain radiates to right temporal area at times . We will increase Citalopram to 20 mg daily today   Patient Active Problem List   Diagnosis Date Noted  . DDD (degenerative disc disease), cervical 06/25/2020  . Lichen sclerosus et atrophicus of the vulva 11/04/2019  . [name] deficiency 12/26/2018  . Osteoarthritis of knees, bilateral 05/26/2017  . Primary open angle glaucoma of both eyes 11/21/2016  . Chronic kidney disease (CKD), stage III (moderate) ([place]) 04/06/2016  . Cystitis 10/12/2015  . Benign hypertension 08/21/2015  . Insomnia, persistent 08/21/2015  . Dyslipidemia 08/21/2015  .  Gastro-esophageal reflux disease without esophagitis 08/21/2015  . Glaucoma 08/21/2015  . Blood glucose elevated 08/21/2015  . Adult hypothyroidism 08/21/2015  . Climacteric 08/21/2015  . Senile osteoporosis 08/21/2015  . Seasonal allergies 08/21/2015  . Female genuine stress incontinence 08/21/2015  . Vitamin D deficiency 08/21/2015    Past Surgical History:  Procedure Laterality Date  . ABDOMINAL HYSTERECTOMY     21 years ago  . APPENDECTOMY    . COLONOSCOPY N/A 09/14/2020   Procedure: COLONOSCOPY;  Surgeon: Lesly Rubenstein, MD;  Location[facility] ENDOSCOPY;  Service: Endoscopy;  Laterality: N/A;  . COLONOSCOPY WITH PROPOFOL N/A 05/29/2015   Procedure: COLONOSCOPY WITH PROPOFOL;  Surgeon: Hulen Luster, MD;  Location: [facility] ENDOSCOPY;  Service: Gastroenterology;  Laterality: N/A;  . ESOPHAGOGASTRODUODENOSCOPY N/A 05/29/2015   Procedure: ESOPHAGOGASTRODUODENOSCOPY (EGD);  Surgeon: Hulen Luster, MD;  Location: [facility] ENDOSCOPY;  Service: Gastroenterology;  Laterality: N/A;  . ESOPHAGOGASTRODUODENOSCOPY N/A 09/14/2020   Procedure: ESOPHAGOGASTRODUODENOSCOPY (EGD);  Surgeon: Lesly Rubenstein, MD;  Location: [facility] ENDOSCOPY;  Service: Endoscopy;  Laterality: N/A;  . EYE SURGERY     eye lids  . OVARIAN CYST REMOVAL     needed transfusion  . PHOTOCOAGULATION WITH LASER Right 11/21/2016   Procedure: PHOTOCOAGULATION WITH LASER;  Surgeon: Ronnell Freshwater, MD;  Location: [place];  Service: Ophthalmology;  Laterality: Right;  A micropulse probe was applied to each hemilimbus with the  following settings: 2033mW, 31.3% duty cycle, 90 seconds x 3 applications.     Family History  Problem Relation Age of Onset  . Hypertension Mother   . Osteoporosis Mother   . COPD Mother   . Dementia Mother   . Cancer Father        Colon  . Hypothyroidism Daughter   . Aneurysm Brother   . COPD Sister     Social History   Tobacco Use  . Smoking status: Never Smoker  . Smokeless tobacco:  Never Used  . Tobacco comment: smoking cessation materials not required  Substance Use Topics  . Alcohol use: No    Alcohol/week: 0.0 standard drinks     Current Outpatient Medications:  .  ALPRAZolam (XANAX) 0.5 MG tablet, Take 1 tablet (0.5 mg total) by mouth daily as needed for anxiety. Rarely, Disp: 10 tablet, Rfl: 0 .  aspirin 81 MG tablet, Take 81 mg by mouth daily. pm, Disp: , Rfl:  .  baclofen (LIORESAL) 10 MG tablet, Take 1 tablet (10 mg total) by mouth 3 (three) times daily as needed for muscle spasms. It may make you sleepy - do not drive, Disp: 90 each, Rfl: 0 .  busPIRone (BUSPAR) 5 MG tablet, Take 1 tablet (5 mg total) by mouth 3 (three) times daily as needed. For anxiety, Disp: 30 tablet, Rfl: 0 .  cholecalciferol (VITAMIN D) 1000 units tablet, Take 1,000 Units by mouth 2 (two) times daily. am, Disp: , Rfl:  .  clobetasol ointment (TEMOVATE) 0.05 %, Apply to affected area every night for 4 weeks, then every other day for 4 weeks and then twice a week for 4 weeks or until resolution., Disp: 30 g, Rfl: 5 .  denosumab (PROLIA) 60 MG/ML SOSY injection, Inject into the skin every 6 (six) months. , Disp: , Rfl:  .  fluticasone (FLONASE) 50 MCG/ACT nasal spray, Place 2 sprays into both nostrils daily. Pm, Disp: 16 g, Rfl: 2 .  levocetirizine (XYZAL) 5 MG tablet, Take 1 tablet (5 mg total) by mouth every evening., Disp: 90 tablet, Rfl: 1 .  levothyroxine (SYNTHROID) 50 MCG tablet, Take 1-2 tablets (50-100 mcg total) by mouth daily before breakfast. Two M, W, F and one other days of the week, Disp: 120 tablet, Rfl: 0 .  losartan (COZAAR) 100 MG tablet, Take 1 tablet (100 mg total) by mouth daily. TAKE 1 TABLET BY MOUTH DAILY, Disp: 90 tablet, Rfl: 0 .  montelukast (SINGULAIR) 10 MG tablet, Take 1 tablet (10 mg total) by mouth daily as needed (allergies)., Disp: 90 tablet, Rfl: 1 .  Omega-3 Fatty Acids (FISH OIL PO), Take by mouth. AM, Disp: , Rfl:  .  pantoprazole (PROTONIX) 40 MG tablet,  TAKE 1 TABLET(40 MG) BY MOUTH DAILY, Disp: 90 tablet, Rfl: 2 .  simvastatin (ZOCOR) 40 MG tablet, Take 1 tablet (40 mg total) by mouth daily., Disp: 90 tablet, Rfl: 1 .  timolol (TIMOPTIC) 0.5 % ophthalmic solution, INT 1 GTT IN OU BID, Disp: , Rfl: 5 .  VYZULTA 0.024 % SOLN, INT 1 GTT INTO OU AT NIGHT, Disp: , Rfl: 2 .  zolpidem (AMBIEN) 5 MG tablet, Take qhs, Disp: 90 tablet, Rfl: 1 .  citalopram (CELEXA) 20 MG tablet, Take 1 tablet (20 mg total) by mouth daily., Disp: 90 tablet, Rfl: 0 .  hydrALAZINE (APRESOLINE) 25 MG tablet, Take 1 tablet (25 mg total) by mouth 3 (three) times daily. If bp above 140/90, Disp: 90 tablet, Rfl: 0 .  hydrochlorothiazide (HYDRODIURIL) 12.5 MG tablet, Take 1 tablet (12.5 mg total) by mouth daily., Disp: 90 tablet, Rfl: 0  Current Facility-Administered Medications:  .  cyanocobalamin ((VITAMIN B-12)) injection 1,000 mcg, 1,000 mcg, Intramuscular, Q30 days, Steele Sizer, MD, 1,000 mcg at 11/05/20 1056  Allergies  Allergen Reactions  . Brimonidine Tartrate   . Latanoprost   . Meloxicam     Increased blood pressure   . Raloxifene     bone pain  . Sulfa Antibiotics Nausea Only    Intolerance rather than allergy.    I personally reviewed active problem list, medication list, allergies, family history, social history, health maintenance with the patient/caregiver today.   ROS  Constitutional: Negative for fever or weight change.  Respiratory: Negative for cough and shortness of breath.   Cardiovascular: Negative for chest pain or palpitations.  Gastrointestinal: Negative for abdominal pain, no bowel changes.  Musculoskeletal: Negative for gait problem or joint swelling.  Skin: Negative for rash.  Neurological: Negative for dizziness or headache.  No other specific complaints in a complete review of systems (except as listed in HPI above).  Objective  Vitals:   11/05/20 1051 11/05/20 1107  BP: (!) 200/118 (!) 148/78  Pulse: 91   Resp: 16    Temp: 98 F (36.7 C)   TempSrc: Oral   SpO2: 98%   Weight: 182 lb 9.6 oz (82.8 kg)   Height: 5\' 5"  (1.651 m)     Body mass index is 30.39 kg/m.  Physical Exam  Constitutional: Patient appears well-developed and well-nourished. ObeseNo distress.  HEENT: head atraumatic, normocephalic, pupils equal and reactive to light,  neck supple Cardiovascular: Normal rate, regular rhythm and normal heart sounds.  No murmur heard. No BLE edema. Pulmonary/Chest: Effort normal and breath sounds normal. No respiratory distress. Abdominal: Soft.  There is no tenderness. Muscular Skeletal: pain during palpation of posterior neck, pain radiates to right arm when rotates head to the right  Psychiatric: Patient has a normal mood and affect. behavior is normal. Judgment and thought content normal.  Recent Results (from the past 2160 hour(s))  SARS CORONAVIRUS 2 (TAT 6-24 HRS) Nasopharyngeal Nasopharyngeal Swab     Status: None   Collection Time: 09/10/20 11:48 AM   Specimen: Nasopharyngeal Swab  Result Value Ref Range   SARS Coronavirus 2 NEGATIVE NEGATIVE    Comment: (NOTE) SARS-CoV-2 target nucleic acids are NOT DETECTED.  The SARS-CoV-2 RNA is generally detectable in upper and lower respiratory specimens during the acute phase of infection. Negative results do not preclude SARS-CoV-2 infection, do not rule out co-infections with other pathogens, and should not be used as the sole basis for treatment or other patient management decisions. Negative results must be combined with clinical observations, patient history, and epidemiological information. The expected result is Negative.  Fact Sheet for Patients: SugarRoll.be  Fact Sheet for Healthcare Providers: https://www.woods-mathews.com/  This test is not yet approved or cleared by the [place] FDA and  has been authorized for detection and/or diagnosis of SARS-CoV-2 by FDA under an Emergency  Use Authorization (EUA). This EUA will remain  in effect (meaning this test can be used) for the duration of the COVID-19 declaration under Se ction 564(b)(1) of the Act, 21 U.S.C. section 360bbb-3(b)(1), unless the authorization is terminated or revoked sooner.  Performed at [facility], [place] [address] [place], [place] [address]   Microalbumin / creatinine urine ratio     Status: Abnormal   Collection Time: 10/19/20 12:22 PM  Result  Value Ref Range   Creatinine, Urine 15 (L) 20 - 275 mg/dL   Microalb, Ur <0.2 mg/dL    Comment: Reference Range Not established    Microalb Creat Ratio NOTE <30 mcg/mg creat    Comment: NOTE: The urine albumin value is less than  0.2 mg/dL therefore we are unable to calculate  excretion and/or creatinine ratio. . The ADA defines abnormalities in albumin excretion as follows: Marland Kitchen Albuminuria Category        Result (mcg/mg creatinine) . Normal to Mildly increased   <30 Moderately increased         30-299  Severely increased           > OR = 300 . The ADA recommends that at least two of three specimens collected within a 3-6 month period be abnormal before considering a patient to be within a diagnostic category.   COMPLETE METABOLIC PANEL WITH GFR     Status: Abnormal   Collection Time: 10/19/20 12:22 PM  Result Value Ref Range   Glucose, Bld 99 65 - 99 mg/dL    Comment: .            Fasting reference interval .    BUN 15 7 - 25 mg/dL   Creat 1.13 (H) 0.60 - 0.93 mg/dL    Comment: For patients >44 years of age, the reference limit for Creatinine is approximately 13% higher for people identified as African-American. .    GFR, Est Non African American 49 (L) > OR = 60 mL/min/1.31m2   GFR, Est African American 56 (L) > OR = 60 mL/min/1.26m2   BUN/Creatinine Ratio 13 6 - 22 (calc)   Sodium 138 135 - 146 mmol/L   Potassium 4.5 3.5 - 5.3 mmol/L   Chloride 104 98 - 110 mmol/L   CO2 25 20 - 32 mmol/L   Calcium 9.9 8.6 - 10.4 mg/dL    Total Protein 7.4 6.1 - 8.1 g/dL   Albumin 4.8 3.6 - 5.1 g/dL   Globulin 2.6 1.9 - 3.7 g/dL (calc)   AG Ratio 1.8 1.0 - 2.5 (calc)   Total Bilirubin 0.5 0.2 - 1.2 mg/dL   Alkaline phosphatase (APISO) 57 37 - 153 U/L   AST 53 (H) 10 - 35 U/L   ALT 54 (H) 6 - 29 U/L  TSH     Status: Abnormal   Collection Time: 10/19/20 12:22 PM  Result Value Ref Range   TSH 9.26 (H) 0.40 - 4.50 mIU/L      PHQ2/9: Depression screen [facility] 2/9 11/05/2020 10/19/2020 09/10/2020 09/04/2020 06/24/2020  Decreased Interest 1 0 0 0 0  Down, Depressed, Hopeless 1 0 0 0 0  PHQ - 2 Score 2 0 0 0 0  Altered sleeping 1 - - - 1  Tired, decreased energy 1 - - - 0  Change in appetite 0 - - - 0  Feeling bad or failure about yourself  0 - - - 0  Trouble concentrating 0 - - - 0  Moving slowly or fidgety/restless 0 - - - -  Suicidal thoughts 0 - - - 0  PHQ-9 Score 4 - - - 1  Difficult doing work/chores Not difficult at all - - - Not difficult at all  Some recent data might be hidden    phq 9 is positive   Fall Risk: Fall Risk  11/05/2020 10/19/2020 09/10/2020 09/04/2020 06/24/2020  Falls in the past year? 0 0 0 0 0  Number falls in past yr:  0 0 0 0 0  Injury with Fall? 0 0 0 0 0  Risk for fall due to : - - No Fall Risks - -  Risk for fall due to: Comment - - - - -  Follow up - - Follow up appointment - -    Functional Status Survey: Is the patient deaf or have difficulty hearing?: No Does the patient have difficulty seeing, even when wearing glasses/contacts?: No Does the patient have difficulty concentrating, remembering, or making decisions?: No Does the patient have difficulty walking or climbing stairs?: No Does the patient have difficulty dressing or bathing?: No Does the patient have difficulty doing errands alone such as visiting a doctor's office or shopping?: No    Assessment & Plan  1. Cervical radiculitis  - pregabalin (LYRICA) 50 MG capsule; Take 1 capsule (50 mg total) by mouth 3 (three) times  daily. Start at night only and increase as tolerate  Dispense: 90 capsule; Refill: 0  2. GAD (generalized anxiety disorder)  - citalopram (CELEXA) 20 MG tablet; Take 1 tablet (20 mg total) by mouth daily.  Dispense: 90 tablet; Refill: 0  3. Benign hypertension  - losartan (COZAAR) 100 MG tablet; Take 1 tablet (100 mg total) by mouth daily. TAKE 1 TABLET BY MOUTH DAILY  Dispense: 90 tablet; Refill: 0 - hydrALAZINE (APRESOLINE) 25 MG tablet; Take 1 tablet (25 mg total) by mouth 3 (three) times daily. If bp above 140/90  Dispense: 90 tablet; Refill: 0

## 2020-11-05 ENCOUNTER — Other Ambulatory Visit: Payer: Self-pay | Admitting: Family Medicine

## 2020-11-05 ENCOUNTER — Encounter: Payer: Self-pay | Admitting: Family Medicine

## 2020-11-05 ENCOUNTER — Ambulatory Visit (INDEPENDENT_AMBULATORY_CARE_PROVIDER_SITE_OTHER): Payer: PPO | Admitting: Family Medicine

## 2020-11-05 VITALS — BP 148/78 | HR 91 | Temp 98.0°F | Resp 16 | Ht 65.0 in | Wt 182.6 lb

## 2020-11-05 DIAGNOSIS — I10 Essential (primary) hypertension: Secondary | ICD-10-CM

## 2020-11-05 DIAGNOSIS — F411 Generalized anxiety disorder: Secondary | ICD-10-CM

## 2020-11-05 DIAGNOSIS — M5030 Other cervical disc degeneration, unspecified cervical region: Secondary | ICD-10-CM

## 2020-11-05 DIAGNOSIS — M5412 Radiculopathy, cervical region: Secondary | ICD-10-CM | POA: Diagnosis not present

## 2020-11-05 DIAGNOSIS — E538 Deficiency of other specified B group vitamins: Secondary | ICD-10-CM | POA: Diagnosis not present

## 2020-11-05 MED ORDER — HYDRALAZINE HCL 25 MG PO TABS: 25.0000 mg | ORAL_TABLET | Freq: Three times a day (TID) | ORAL | 0 refills | Status: DC

## 2020-11-05 MED ORDER — PREGABALIN 50 MG PO CAPS: 50.0000 mg | ORAL_CAPSULE | Freq: Three times a day (TID) | ORAL | 0 refills | Status: DC

## 2020-11-05 MED ORDER — LOSARTAN POTASSIUM 100 MG PO TABS: 100.0000 mg | ORAL_TABLET | Freq: Every day | ORAL | 0 refills | Status: DC

## 2020-11-05 MED ORDER — CITALOPRAM HYDROBROMIDE 20 MG PO TABS: 20.0000 mg | ORAL_TABLET | Freq: Every day | ORAL | 0 refills | Status: DC

## 2020-11-05 MED ORDER — HYDROCHLOROTHIAZIDE 12.5 MG PO TABS: 12.5000 mg | ORAL_TABLET | Freq: Every day | ORAL | 0 refills | Status: DC

## 2020-11-05 NOTE — Patient Instructions (Addendum)
Take 20 mg of Citalopram  Continue Losartan 100 mg You can take Hydralazine 20 mg ( two of 10 mg ) or new rx of 25 mg if bp is above 145/90 Start hydrochlorothiazide 12.5 mg to take every morning and you may not need to take hydralazine is often  Pregablin - is for nerve pain on your neck

## 2020-11-05 NOTE — Progress Notes (Unsigned)
Name: Dawn Fuentes   MRN: [id number]    DOB: [birth year]   Date:11/05/2020       Progress Note  Subjective  Chief Complaint  No chief complaint on file.   HPI  *** Patient Active Problem List   Diagnosis Date Noted  . DDD (degenerative disc disease), cervical 06/25/2020  . Lichen sclerosus et atrophicus of the vulva 11/04/2019  . [name] deficiency 12/26/2018  . Osteoarthritis of knees, bilateral 05/26/2017  . Primary open angle glaucoma of both eyes 11/21/2016  . Chronic kidney disease (CKD), stage III (moderate) ([place]) 04/06/2016  . Cystitis 10/12/2015  . Benign hypertension 08/21/2015  . Insomnia, persistent 08/21/2015  . Dyslipidemia 08/21/2015  . Gastro-esophageal reflux disease without esophagitis 08/21/2015  . Glaucoma 08/21/2015  . Blood glucose elevated 08/21/2015  . Adult hypothyroidism 08/21/2015  . Climacteric 08/21/2015  . Senile osteoporosis 08/21/2015  . Seasonal allergies 08/21/2015  . Female genuine stress incontinence 08/21/2015  . Vitamin D deficiency 08/21/2015    Past Surgical History:  Procedure Laterality Date  . ABDOMINAL HYSTERECTOMY     21 years ago  . APPENDECTOMY    . COLONOSCOPY N/A 09/14/2020   Procedure: COLONOSCOPY;  Surgeon: Lesly Rubenstein, MD;  Location: [facility] ENDOSCOPY;  Service: Endoscopy;  Laterality: N/A;  . COLONOSCOPY WITH PROPOFOL N/A 05/29/2015   Procedure: COLONOSCOPY WITH PROPOFOL;  Surgeon: Hulen Luster, MD;  Location: [facility] ENDOSCOPY;  Service: Gastroenterology;  Laterality: N/A;  . ESOPHAGOGASTRODUODENOSCOPY N/A 05/29/2015   Procedure: ESOPHAGOGASTRODUODENOSCOPY (EGD);  Surgeon: Hulen Luster, MD;  Location: [facility] ENDOSCOPY;  Service: Gastroenterology;  Laterality: N/A;  . ESOPHAGOGASTRODUODENOSCOPY N/A 09/14/2020   Procedure: ESOPHAGOGASTRODUODENOSCOPY (EGD);  Surgeon: Lesly Rubenstein, MD;  Location: [facility] ENDOSCOPY;  Service: Endoscopy;  Laterality: N/A;  . EYE SURGERY     eye lids  . OVARIAN CYST REMOVAL     needed  transfusion  . PHOTOCOAGULATION WITH LASER Right 11/21/2016   Procedure: PHOTOCOAGULATION WITH LASER;  Surgeon: Ronnell Freshwater, MD;  Location: [place];  Service: Ophthalmology;  Laterality: Right;  A micropulse probe was applied to each hemilimbus with the following settings: 2032mW, 31.3% duty cycle, 90 seconds x 3 applications.     Family History  Problem Relation Age of Onset  . Hypertension Mother   . Osteoporosis Mother   . COPD Mother   . Dementia Mother   . Cancer Father        Colon  . Hypothyroidism Daughter   . Aneurysm Brother   . COPD Sister     Social History   Tobacco Use  . Smoking status: Never Smoker  . Smokeless tobacco: Never Used  . Tobacco comment: smoking cessation materials not required  Substance Use Topics  . Alcohol use: No    Alcohol/week: 0.0 standard drinks     Current Outpatient Medications:  .  ALPRAZolam (XANAX) 0.5 MG tablet, Take 1 tablet (0.5 mg total) by mouth daily as needed for anxiety. Rarely, Disp: 10 tablet, Rfl: 0 .  aspirin 81 MG tablet, Take 81 mg by mouth daily. pm, Disp: , Rfl:  .  baclofen (LIORESAL) 10 MG tablet, Take 1 tablet (10 mg total) by mouth 3 (three) times daily as needed for muscle spasms. It may make you sleepy - do not drive, Disp: 90 each, Rfl: 0 .  busPIRone (BUSPAR) 5 MG tablet, Take 1 tablet (5 mg total) by mouth 3 (three) times daily as needed. For anxiety, Disp: 30 tablet, Rfl: 0 .  cholecalciferol (VITAMIN  D) 1000 units tablet, Take 1,000 Units by mouth 2 (two) times daily. am, Disp: , Rfl:  .  citalopram (CELEXA) 10 MG tablet, Take 1 tablet (10 mg total) by mouth daily., Disp: 90 tablet, Rfl: 1 .  clobetasol ointment (TEMOVATE) 0.05 %, Apply to affected area every night for 4 weeks, then every other day for 4 weeks and then twice a week for 4 weeks or until resolution., Disp: 30 g, Rfl: 5 .  denosumab (PROLIA) 60 MG/ML SOSY injection, Inject into the skin every 6 (six) months. , Disp: , Rfl:   .  fluticasone (FLONASE) 50 MCG/ACT nasal spray, Place 2 sprays into both nostrils daily. Pm, Disp: 16 g, Rfl: 2 .  hydrALAZINE (APRESOLINE) 10 MG tablet, Take 1 tablet (10 mg total) by mouth 3 (three) times daily. If bp above 140/90, Disp: 30 tablet, Rfl: 0 .  levocetirizine (XYZAL) 5 MG tablet, Take 1 tablet (5 mg total) by mouth every evening., Disp: 90 tablet, Rfl: 1 .  levothyroxine (SYNTHROID) 50 MCG tablet, Take 1-2 tablets (50-100 mcg total) by mouth daily before breakfast. Two M, W, F and one other days of the week, Disp: 120 tablet, Rfl: 0 .  losartan (COZAAR) 100 MG tablet, Take 1 tablet (100 mg total) by mouth daily. TAKE 1 TABLET BY MOUTH DAILY, Disp: 90 tablet, Rfl: 0 .  montelukast (SINGULAIR) 10 MG tablet, Take 1 tablet (10 mg total) by mouth daily as needed (allergies)., Disp: 90 tablet, Rfl: 1 .  Omega-3 Fatty Acids (FISH OIL PO), Take by mouth. AM, Disp: , Rfl:  .  pantoprazole (PROTONIX) 40 MG tablet, TAKE 1 TABLET(40 MG) BY MOUTH DAILY, Disp: 90 tablet, Rfl: 2 .  simvastatin (ZOCOR) 40 MG tablet, Take 1 tablet (40 mg total) by mouth daily., Disp: 90 tablet, Rfl: 1 .  timolol (TIMOPTIC) 0.5 % ophthalmic solution, INT 1 GTT IN OU BID, Disp: , Rfl: 5 .  VYZULTA 0.024 % SOLN, INT 1 GTT INTO OU AT NIGHT, Disp: , Rfl: 2 .  zolpidem (AMBIEN) 5 MG tablet, Take qhs, Disp: 90 tablet, Rfl: 1  Current Facility-Administered Medications:  .  cyanocobalamin ((VITAMIN B-12)) injection 1,000 mcg, 1,000 mcg, Intramuscular, Q30 days, Steele Sizer, MD, 1,000 mcg at 11/05/20 1056  Allergies  Allergen Reactions  . Brimonidine Tartrate   . Latanoprost   . Meloxicam     Increased blood pressure   . Raloxifene     bone pain  . Sulfa Antibiotics Nausea Only    Intolerance rather than allergy.    I personally reviewed {Reviewed:14835} with the patient/caregiver today.   ROS  ***  Objective  There were no vitals filed for this visit.  There is no height or weight on file to  calculate BMI.  Physical Exam ***  Recent Results (from the past 2160 hour(s))  SARS CORONAVIRUS 2 (TAT 6-24 HRS) Nasopharyngeal Nasopharyngeal Swab     Status: None   Collection Time: 09/10/20 11:48 AM   Specimen: Nasopharyngeal Swab  Result Value Ref Range   SARS Coronavirus 2 NEGATIVE NEGATIVE    Comment: (NOTE) SARS-CoV-2 target nucleic acids are NOT DETECTED.  The SARS-CoV-2 RNA is generally detectable in upper and lower respiratory specimens during the acute phase of infection. Negative results do not preclude SARS-CoV-2 infection, do not rule out co-infections with other pathogens, and should not be used as the sole basis for treatment or other patient management decisions. Negative results must be combined with clinical observations, patient history, and epidemiological  information. The expected result is Negative.  Fact Sheet for Patients: SugarRoll.be  Fact Sheet for Healthcare Providers: https://www.woods-mathews.com/  This test is not yet approved or cleared by the [place] FDA and  has been authorized for detection and/or diagnosis of SARS-CoV-2 by FDA under an Emergency Use Authorization (EUA). This EUA will remain  in effect (meaning this test can be used) for the duration of the COVID-19 declaration under Se ction 564(b)(1) of the Act, 21 U.S.C. section 360bbb-3(b)(1), unless the authorization is terminated or revoked sooner.  Performed at [facility], [place] [address] [place], [place] [address]   Microalbumin / creatinine urine ratio     Status: Abnormal   Collection Time: 10/19/20 12:22 PM  Result Value Ref Range   Creatinine, Urine 15 (L) 20 - 275 mg/dL   Microalb, Ur <0.2 mg/dL    Comment: Reference Range Not established    Microalb Creat Ratio NOTE <30 mcg/mg creat    Comment: NOTE: The urine albumin value is less than  0.2 mg/dL therefore we are unable to calculate  excretion and/or creatinine  ratio. . The ADA defines abnormalities in albumin excretion as follows: Marland Kitchen Albuminuria Category        Result (mcg/mg creatinine) . Normal to Mildly increased   <30 Moderately increased         30-299  Severely increased           > OR = 300 . The ADA recommends that at least two of three specimens collected within a 3-6 month period be abnormal before considering a patient to be within a diagnostic category.   COMPLETE METABOLIC PANEL WITH GFR     Status: Abnormal   Collection Time: 10/19/20 12:22 PM  Result Value Ref Range   Glucose, Bld 99 65 - 99 mg/dL    Comment: .            Fasting reference interval .    BUN 15 7 - 25 mg/dL   Creat 1.13 (H) 0.60 - 0.93 mg/dL    Comment: For patients >45 years of age, the reference limit for Creatinine is approximately 13% higher for people identified as African-American. .    GFR, Est Non African American 49 (L) > OR = 60 mL/min/1.7m2   GFR, Est African American 56 (L) > OR = 60 mL/min/1.74m2   BUN/Creatinine Ratio 13 6 - 22 (calc)   Sodium 138 135 - 146 mmol/L   Potassium 4.5 3.5 - 5.3 mmol/L   Chloride 104 98 - 110 mmol/L   CO2 25 20 - 32 mmol/L   Calcium 9.9 8.6 - 10.4 mg/dL   Total Protein 7.4 6.1 - 8.1 g/dL   Albumin 4.8 3.6 - 5.1 g/dL   Globulin 2.6 1.9 - 3.7 g/dL (calc)   AG Ratio 1.8 1.0 - 2.5 (calc)   Total Bilirubin 0.5 0.2 - 1.2 mg/dL   Alkaline phosphatase (APISO) 57 37 - 153 U/L   AST 53 (H) 10 - 35 U/L   ALT 54 (H) 6 - 29 U/L  TSH     Status: Abnormal   Collection Time: 10/19/20 12:22 PM  Result Value Ref Range   TSH 9.26 (H) 0.40 - 4.50 mIU/L    Diabetic Foot Exam: Diabetic Foot Exam - Simple   No data filed     ***  PHQ2/9: Depression screen [facility] 2/9 11/05/2020 10/19/2020 09/10/2020 09/04/2020 06/24/2020  Decreased Interest 1 0 0 0 0  Down, Depressed, Hopeless 1 0  0 0 0  PHQ - 2 Score 2 0 0 0 0  Altered sleeping 1 - - - 1  Tired, decreased energy 1 - - - 0  Change in appetite 0 - - - 0  Feeling bad  or failure about yourself  0 - - - 0  Trouble concentrating 0 - - - 0  Moving slowly or fidgety/restless 0 - - - -  Suicidal thoughts 0 - - - 0  PHQ-9 Score 4 - - - 1  Difficult doing work/chores Not difficult at all - - - Not difficult at all  Some recent data might be hidden    phq 9 is {gen pos [id number]} ***  Fall Risk: Fall Risk  11/05/2020 10/19/2020 09/10/2020 09/04/2020 06/24/2020  Falls in the past year? 0 0 0 0 0  Number falls in past yr: 0 0 0 0 0  Injury with Fall? 0 0 0 0 0  Risk for fall due to : - - No Fall Risks - -  Risk for fall due to: Comment - - - - -  Follow up - - Follow up appointment - -   ***   Functional Status Survey:   ***   Assessment & Plan  *** 1. DDD (degenerative disc disease), cervical ***

## 2020-11-07 ENCOUNTER — Other Ambulatory Visit: Payer: Self-pay | Admitting: Family Medicine

## 2020-11-07 DIAGNOSIS — I10 Essential (primary) hypertension: Secondary | ICD-10-CM

## 2020-11-18 DIAGNOSIS — Z6830 Body mass index (BMI) 30.0-30.9, adult: Secondary | ICD-10-CM | POA: Insufficient documentation

## 2020-11-18 DIAGNOSIS — I10 Essential (primary) hypertension: Secondary | ICD-10-CM | POA: Diagnosis not present

## 2020-11-18 DIAGNOSIS — M5412 Radiculopathy, cervical region: Secondary | ICD-10-CM | POA: Diagnosis not present

## 2020-11-26 DIAGNOSIS — M5412 Radiculopathy, cervical region: Secondary | ICD-10-CM | POA: Diagnosis not present

## 2020-11-30 ENCOUNTER — Other Ambulatory Visit: Payer: Self-pay

## 2020-11-30 DIAGNOSIS — E039 Hypothyroidism, unspecified: Secondary | ICD-10-CM

## 2020-12-01 ENCOUNTER — Other Ambulatory Visit: Payer: Self-pay

## 2020-12-01 ENCOUNTER — Encounter: Payer: Self-pay | Admitting: Student in an Organized Health Care Education/Training Program

## 2020-12-01 ENCOUNTER — Ambulatory Visit (HOSPITAL_BASED_OUTPATIENT_CLINIC_OR_DEPARTMENT_OTHER): Payer: PPO | Admitting: Student in an Organized Health Care Education/Training Program

## 2020-12-01 ENCOUNTER — Ambulatory Visit
Admission: RE | Admit: 2020-12-01 | Discharge: 2020-12-01 | Disposition: A | Discharge status: Home / Self Care | Payer: PPO | Source: Ambulatory Visit | Attending: Student in an Organized Health Care Education/Training Program | Admitting: Student in an Organized Health Care Education/Training Program

## 2020-12-01 VITALS — BP 188/91 | HR 102 | Temp 97.7°F | Resp 20 | Ht 65.0 in | Wt 180.0 lb

## 2020-12-01 DIAGNOSIS — G894 Chronic pain syndrome: Secondary | ICD-10-CM | POA: Insufficient documentation

## 2020-12-01 DIAGNOSIS — M25551 Pain in right hip: Secondary | ICD-10-CM | POA: Insufficient documentation

## 2020-12-01 DIAGNOSIS — M533 Sacrococcygeal disorders, not elsewhere classified: Secondary | ICD-10-CM | POA: Diagnosis present

## 2020-12-01 DIAGNOSIS — M545 Low back pain, unspecified: Secondary | ICD-10-CM | POA: Insufficient documentation

## 2020-12-01 DIAGNOSIS — M47816 Spondylosis without myelopathy or radiculopathy, lumbar region: Secondary | ICD-10-CM | POA: Diagnosis not present

## 2020-12-01 DIAGNOSIS — M25552 Pain in left hip: Secondary | ICD-10-CM | POA: Insufficient documentation

## 2020-12-01 DIAGNOSIS — G8929 Other chronic pain: Secondary | ICD-10-CM

## 2020-12-01 DIAGNOSIS — M5412 Radiculopathy, cervical region: Secondary | ICD-10-CM | POA: Insufficient documentation

## 2020-12-01 LAB — TSH: TSH: 0.56 m[IU]/L (ref 0.40–4.50)

## 2020-12-01 MED ORDER — TIZANIDINE HCL 4 MG PO TABS: 2.0000 mg | ORAL_TABLET | Freq: Every evening | ORAL | 1 refills | Status: DC | PRN

## 2020-12-01 MED ORDER — PREGABALIN 50 MG PO CAPS: ORAL_CAPSULE | ORAL | 0 refills | Status: DC

## 2020-12-01 NOTE — Patient Instructions (Addendum)
1. Lyrica change to 50 mg in morning, 100 mg at night 2. Stop Baclofen, start Tizanidine 2-4 mg at night as needed 3. Plan for cervical epidural with oral valium ( have [facility] put you on cancellation list for next M/W if procedure slot opens up) 4. Xrays   ____________________________________________________________________________________________  Preparing for your procedure (without sedation)  Procedure appointments are limited to planned procedures: . No Prescription Refills. . No disability issues will be discussed. . No medication changes will be discussed.  Instructions: . Oral Intake: Do not eat or drink anything for at least 6 hours prior to your procedure. (Exception: Blood Pressure Medication. See below.) . Transportation: Unless otherwise stated by your physician, you may drive yourself after the procedure. . Blood Pressure Medicine: Do not forget to take your blood pressure medicine with a sip of water the morning of the procedure. If your Diastolic (lower reading)is above 100 mmHg, elective cases will be cancelled/rescheduled. . Blood thinners: These will need to be stopped for procedures. Notify our staff if you are taking any blood thinners. Depending on which one you take, there will be specific instructions on how and when to stop it. . Diabetics on insulin: Notify the staff so that you can be scheduled 1st case in the morning. If your diabetes requires high dose insulin, take only  of your normal insulin dose the morning of the procedure and notify the staff that you have done so. . Preventing infections: Shower with an antibacterial soap the morning of your procedure.  . Build-up your immune system: Take 1000 mg of Vitamin C with every meal (3 times a day) the day prior to your procedure. Marland Kitchen Antibiotics: Inform the staff if you have a condition or reason that requires you to take antibiotics before dental procedures. . Pregnancy: If you are pregnant, call and cancel the  procedure. . Sickness: If you have a cold, fever, or any active infections, call and cancel the procedure. . Arrival: You must be in the facility at least 30 minutes prior to your scheduled procedure. . Children: Do not bring any children with you. . Dress appropriately: Bring dark clothing that you would not mind if they get stained. . Valuables: Do not bring any jewelry or valuables.  Reasons to call and reschedule or cancel your procedure: (Following these recommendations will minimize the risk of a serious complication.) . Surgeries: Avoid having procedures within 2 weeks of any surgery. (Avoid for 2 weeks before or after any surgery). . Flu Shots: Avoid having procedures within 2 weeks of a flu shots or . (Avoid for 2 weeks before or after immunizations). . Barium: Avoid having a procedure within 7-10 days after having had a radiological study involving the use of radiological contrast. (Myelograms, Barium swallow or enema study). . Heart attacks: Avoid any elective procedures or surgeries for the initial 6 months after a "Myocardial Infarction" (Heart Attack). . Blood thinners: It is imperative that you stop these medications before procedures. Let us know if you if you take any blood thinner.  . Infection: Avoid procedures during or within two weeks of an infection (including chest colds or gastrointestinal problems). Symptoms associated with infections include: Localized redness, fever, chills, night sweats or profuse sweating, burning sensation when voiding, cough, congestion, stuffiness, runny nose, sore throat, diarrhea, nausea, vomiting, cold or Flu symptoms, recent or current infections. It is specially important if the infection is over the area that we intend to treat. Marland Kitchen Heart and lung problems: Symptoms that may  suggest an active cardiopulmonary problem include: cough, chest pain, breathing difficulties or shortness of breath, dizziness, ankle swelling, uncontrolled high or unusually  low blood pressure, and/or palpitations. If you are experiencing any of these symptoms, cancel your procedure and contact your primary care physician for an evaluation.  Remember:  Regular Business hours are:  Monday to Thursday 8:00 AM to 4:00 PM  Provider's Schedule: Milinda Pointer, MD:  Procedure days: Tuesday and Thursday 7:30 AM to 4:00 PM  Gillis Santa, MD:  Procedure days: Monday and Wednesday 7:30 AM to 4:00 PM ____________________________________________________________________________________________  They have like to be pleased with Jeneen Rinks

## 2020-12-01 NOTE — Progress Notes (Signed)
Patient: Dawn Fuentes  Service Category: E/M  Provider: Gillis Santa, MD  DOB: 01/27/1946  DOS: 12/01/2020  Referring Provider: Steele Sizer, MD  MRN: [id number]  Setting: Ambulatory outpatient  PCP: Steele Sizer, MD  Type: New Patient  Specialty: Interventional Pain Management    Location: Office  Delivery: Face-to-face     Primary Reason(s) for Visit: Encounter for initial evaluation of one or more chronic problems (new to examiner) potentially causing chronic pain, and posing a threat to normal musculoskeletal function. (Level of risk: High) CC: New Patient (Initial Visit)  HPI  Dawn Fuentes is a 74 y.o. year old, female patient, who comes for the first time to our practice referred by Steele Sizer, MD for our initial evaluation of her chronic pain. She has Benign hypertension; Insomnia, persistent; Dyslipidemia; Gastro-esophageal reflux disease without esophagitis; Glaucoma; Blood glucose elevated; Adult hypothyroidism; Climacteric; Senile osteoporosis; Seasonal allergies; Female genuine stress incontinence; Vitamin D deficiency; Cystitis; Chronic kidney disease (CKD), stage III (moderate) ([place]); Primary open angle glaucoma of both eyes; Osteoarthritis of knees, bilateral; [name] deficiency; Lichen sclerosus et atrophicus of the vulva; DDD (degenerative disc disease), cervical; Cervical radiculitis; Bilateral hip pain; Chronic bilateral low back pain without sciatica; Chronic SI joint pain; and Chronic pain syndrome on their problem list. Today she comes in for evaluation of her New Patient (Initial Visit)  Pain Assessment: Location: Right Neck Radiating: Radaites from right shoulder upwards the right ear and right temple and causing headaches. Onset: More than a month ago Duration: Chronic pain Quality: Constant,Throbbing,Aching,Headache,Discomfort,Cramping Severity: 2 /10 (subjective, self-reported pain score)  Effect on ADL: "I keep on doing what I normally do but when I have  headaches it slows me down" Timing: Constant Modifying factors: Tylenol, heat and PT BP: (!) 188/91  HR: (!) 102  Onset and Duration: Gradual and Present longer than 3 months Cause of pain: Arthritis Severity: Getting worse, NAS-11 at its worse: 8/10, NAS-11 at its best: 2/10, NAS-11 now: 8/10 and NAS-11 on the average: 8/10 Timing: Not influenced by the time of the day Aggravating Factors: Motion and Twisting Alleviating Factors: Cold packs, Hot packs, Medications, Resting, Sleeping and Warm showers or baths Associated Problems: Fatigue, Inability to control bladder (urine), Spasms, Tingling and Pain that wakes patient up Quality of Pain: Aching, Annoying, Dull, Sharp, Shooting, Splitting, Throbbing, Toothache-like and Uncomfortable Previous Examinations or Tests: MRI scan and X-rays Previous Treatments: Physical Therapy, Steroid treatments by mouth and TENS  Dawn Fuentes is a pleasant 74 year old female who presents with a chief complaint of right neck pain that radiates to her proximal shoulder and occasionally to her bicep and also radiates superiorly to the posterior portion of her occiput.  This started approximately 6 months ago.  No inciting or traumatic event.  She states that she does have chronic headaches as a result of this.  She is being referred by Dr. Ancil Boozer.  She is currently participating in physical therapy at [facility].  She has tried gabapentin in the past which was not very effective so she is currently on Lyrica 50 mg twice a day.  She has also tried meloxicam in the past which caused an elevation in blood pressure so she discontinued.  Of note she has also tried Flexeril in the past which made her very sedated.  She is currently on baclofen as needed for muscle spasms.  Patient had a cervical MRI performed, results are below.    Patient second pain complaint is her right buttock, right hip and lower back region.  This is been going on for more than 3 months.  It is worse with  weightbearing.  She also feels intermittent pain in her right groin at times.  Historic Controlled Substance Pharmacotherapy Review   Risk Assessment Profile: Aberrant behavior: None observed or detected today Risk factors for fatal opioid overdose: None identified today Fatal overdose hazard ratio (HR): Calculation deferred Non-fatal overdose hazard ratio (HR): Calculation deferred Risk of opioid abuse or dependence: 0.7-3.0% with doses ? 36 MME/day and 6.1-26% with doses ? 120 MME/day. Substance use disorder (SUD) risk level: See below Personal History of Substance Abuse (SUD-Substance use disorder):  Alcohol: Negative  Illegal Drugs: Negative  Rx Drugs: Negative  ORT Risk Level calculation: Low Risk  Opioid Risk Tool - 12/01/20 1307      Family History of Substance Abuse   Alcohol Negative    Illegal Drugs Negative    Rx Drugs Negative      Personal History of Substance Abuse   Alcohol Negative    Illegal Drugs Negative    Rx Drugs Negative      Age   Age between 67-45 years  No      Psychological Disease   Psychological Disease Negative    Depression Negative      Total Score   Opioid Risk Tool Scoring 0    Opioid Risk Interpretation Low Risk          ORT Scoring interpretation table:  Score <3 = Low Risk for SUD  Score between 4-7 = Moderate Risk for SUD  Score >8 = High Risk for Opioid Abuse    Pharmacologic Plan: As per protocol, I have not taken over any controlled substance management, pending the results of ordered tests and/or consults.            Initial impression: No immediate contraindications found.  Meds   Current Outpatient Medications:  .  ALPRAZolam (XANAX) 0.5 MG tablet, Take 1 tablet (0.5 mg total) by mouth daily as needed for anxiety. Rarely, Disp: 10 tablet, Rfl: 0 .  aspirin 81 MG tablet, Take 81 mg by mouth daily. pm, Disp: , Rfl:  .  busPIRone (BUSPAR) 5 MG tablet, Take 1 tablet (5 mg total) by mouth 3 (three) times daily as needed.  For anxiety, Disp: 30 tablet, Rfl: 0 .  cholecalciferol (VITAMIN D) 1000 units tablet, Take 1,000 Units by mouth 2 (two) times daily. am, Disp: , Rfl:  .  citalopram (CELEXA) 20 MG tablet, Take 1 tablet (20 mg total) by mouth daily., Disp: 90 tablet, Rfl: 0 .  clobetasol ointment (TEMOVATE) 0.05 %, Apply to affected area every night for 4 weeks, then every other day for 4 weeks and then twice a week for 4 weeks or until resolution., Disp: 30 g, Rfl: 5 .  denosumab (PROLIA) 60 MG/ML SOSY injection, Inject into the skin every 6 (six) months. , Disp: , Rfl:  .  fluticasone (FLONASE) 50 MCG/ACT nasal spray, Place 2 sprays into both nostrils daily. Pm, Disp: 16 g, Rfl: 2 .  hydrALAZINE (APRESOLINE) 25 MG tablet, TAKE 1 TABLET BY MOUTH THREE TIMES DAILY; IF BLOOD PRESSURE ABOVE 140/90, Disp: 270 tablet, Rfl: 0 .  hydrochlorothiazide (HYDRODIURIL) 12.5 MG tablet, Take 1 tablet (12.5 mg total) by mouth daily., Disp: 90 tablet, Rfl: 0 .  levocetirizine (XYZAL) 5 MG tablet, Take 1 tablet (5 mg total) by mouth every evening., Disp: 90 tablet, Rfl: 1 .  levothyroxine (SYNTHROID) 50 MCG tablet, Take 1-2 tablets (50-100 mcg  total) by mouth daily before breakfast. Two M, W, F and one other days of the week, Disp: 120 tablet, Rfl: 0 .  losartan (COZAAR) 100 MG tablet, Take 1 tablet (100 mg total) by mouth daily. TAKE 1 TABLET BY MOUTH DAILY, Disp: 90 tablet, Rfl: 0 .  montelukast (SINGULAIR) 10 MG tablet, Take 1 tablet (10 mg total) by mouth daily as needed (allergies)., Disp: 90 tablet, Rfl: 1 .  Omega-3 Fatty Acids (FISH OIL PO), Take by mouth. AM, Disp: , Rfl:  .  pantoprazole (PROTONIX) 40 MG tablet, TAKE 1 TABLET(40 MG) BY MOUTH DAILY, Disp: 90 tablet, Rfl: 2 .  simvastatin (ZOCOR) 40 MG tablet, Take 1 tablet (40 mg total) by mouth daily., Disp: 90 tablet, Rfl: 1 .  timolol (TIMOPTIC) 0.5 % ophthalmic solution, INT 1 GTT IN OU BID, Disp: , Rfl: 5 .  VYZULTA 0.024 % SOLN, INT 1 GTT INTO OU AT NIGHT, Disp: , Rfl:  2 .  zolpidem (AMBIEN) 5 MG tablet, Take qhs, Disp: 90 tablet, Rfl: 1 .  pregabalin (LYRICA) 50 MG capsule, 50 mg during the day, 100 mg at night, Disp: 90 capsule, Rfl: 0 .  tiZANidine (ZANAFLEX) 4 MG tablet, Take 0.5-1 tablets (2-4 mg total) by mouth at bedtime as needed for muscle spasms., Disp: 30 tablet, Rfl: 1  Current Facility-Administered Medications:  .  cyanocobalamin ((VITAMIN B-12)) injection 1,000 mcg, 1,000 mcg, Intramuscular, Q30 days, Steele Sizer, MD, 1,000 mcg at 11/05/20 1056  Imaging Review  Cervical Imaging: Cervical MR wo contrast: Results for orders placed during the hospital encounter of 11/04/20  MR Cervical Spine Wo Contrast  Narrative CLINICAL DATA:  Cervical radiculopathy. Right-sided neck pain for 6 months traveling into the right temple/here region with headaches. Arm pain.  EXAM: MRI CERVICAL SPINE WITHOUT CONTRAST  TECHNIQUE: Multiplanar, multisequence MR imaging of the cervical spine was performed. No intravenous contrast was administered.  COMPARISON:  Cervical spine radiographs 06/24/2020  FINDINGS: Alignment: Cervical spine straightening.  No significant listhesis.  Vertebrae: No fracture, suspicious osseous lesion, or significant marrow edema. Mild chronic degenerative endplate changes at M4-6 and C6-7 associated with moderate disc space narrowing, greater at C6-7.  Cord: Normal signal and morphology.  Posterior Fossa, vertebral arteries, paraspinal tissues: Unremarkable.  Disc levels:  C2-3: Negative.  C3-4: Disc bulging eccentric to the right and mild facet arthrosis result in mild right neural foraminal stenosis without spinal stenosis.  C4-5: Mild disc bulging results in borderline to mild right neural foraminal stenosis without spinal stenosis.  C5-6: Disc bulging greater to the right and mild uncovertebral spurring result in moderate right neural foraminal stenosis with potential right C6 nerve root impingement. No  spinal stenosis.  C6-7: Broad-based posterior disc osteophyte complex without significant stenosis.  C7-T1: Mild disc bulging without stenosis.  IMPRESSION: 1. Cervical disc degeneration most notable at C5-6 where there is moderate right neural foraminal stenosis. 2. No spinal stenosis.   Electronically Signed By: Logan Bores M.D. On: 11/05/2020 10:25 DG Cervical Spine Complete  Narrative CLINICAL DATA:  Neck pain  EXAM: CERVICAL SPINE - COMPLETE 4+ VIEW  COMPARISON:  None  FINDINGS: Disc space narrowing and spurring at C5-6 and C6-7. Bilateral degenerative facet disease with bilateral neural foraminal narrowing, on the right at C4-5 through C6-7 and on the lift at C5-6. Prevertebral soft tissues are normal. Alignment is normal. No fracture.  IMPRESSION: Degenerative disc and facet disease as above. No acute bony abnormality.   Electronically Signed By: Rolm Baptise M.D.  On: 06/24/2020 21:11    Complexity Note: Imaging results reviewed. Results shared with [name], using Layman's terms.                         ROS  Cardiovascular: Abnormal heart rhythm and Daily Aspirin intake Pulmonary or Respiratory: No reported pulmonary signs or symptoms such as wheezing and difficulty taking a deep full breath (Asthma), difficulty blowing air out (Emphysema), coughing up mucus (Bronchitis), persistent dry cough, or temporary stoppage of breathing during sleep Neurological: Incontinence:  Urinary Psychological-Psychiatric: Anxiousness and Prone to panicking Gastrointestinal: Reflux or heatburn Genitourinary: Kidney disease Hematological: No reported hematological signs or symptoms such as prolonged bleeding, low or poor functioning platelets, bruising or bleeding easily, hereditary bleeding problems, low energy levels due to low hemoglobin or being anemic Endocrine: Slow thyroid Rheumatologic: No reported rheumatological signs and symptoms such as fatigue, joint pain,  tenderness, swelling, redness, heat, stiffness, decreased range of motion, with or without associated rash Musculoskeletal: Negative for myasthenia gravis, muscular dystrophy, multiple sclerosis or malignant hyperthermia Work History: Retired  Allergies  Ms. Mathurin is allergic to brimonidine tartrate, latanoprost, meloxicam, raloxifene, and sulfa antibiotics.  Laboratory Chemistry Profile   Renal Lab Results  Component Value Date   BUN 15 10/19/2020   CREATININE 1.13 (H) 10/19/2020   LABCREA 15 (L) 10/19/2020   BCR 13 10/19/2020   GFRAA 56 (L) 10/19/2020   GFRNONAA 49 (L) 10/19/2020   SPECGRAV 1.010 11/01/2018   PHUR 7.0 11/01/2018   PROTEINUR NEGATIVE 06/05/2020     Electrolytes Lab Results  Component Value Date   NA 138 10/19/2020   K 4.5 10/19/2020   CL 104 10/19/2020   CALCIUM 9.9 10/19/2020     Hepatic Lab Results  Component Value Date   AST 53 (H) 10/19/2020   ALT 54 (H) 10/19/2020   ALBUMIN 4.2 03/08/2017   ALKPHOS 50 03/08/2017     ID Lab Results  Component Value Date   SARSCOV2NAA NEGATIVE 09/10/2020     Bone Lab Results  Component Value Date   VD25OH 35 03/18/2020     Endocrine Lab Results  Component Value Date   GLUCOSE 99 10/19/2020   GLUCOSEU NEGATIVE 06/05/2020   HGBA1C 5.5 03/08/2017   TSH 0.56 11/30/2020     Neuropathy Lab Results  Component Value Date   VITAMINB12 1,519 (H) 03/18/2020   FOLATE 12.5 05/21/2019   HGBA1C 5.5 03/08/2017     CNS No results found for: COLORCSF, APPEARCSF, RBCCOUNTCSF, WBCCSF, POLYSCSF, LYMPHSCSF, EOSCSF, PROTEINCSF, GLUCCSF, JCVIRUS, CSFOLI, IGGCSF, LABACHR, ACETBL, LABACHR, ACETBL   Inflammation (CRP: Acute  ESR: Chronic) No results found for: CRP, ESRSEDRATE, LATICACIDVEN   Rheumatology No results found for: RF, ANA, LABURIC, URICUR, LYMEIGGIGMAB, LYMEABIGMQN, HLAB27   Coagulation Lab Results  Component Value Date   PLT 238 03/18/2020     Cardiovascular Lab Results  Component Value Date    TROPONINI <0.03 09/17/2016   HGB 13.6 03/18/2020   HCT 41.6 03/18/2020     Screening Lab Results  Component Value Date   SARSCOV2NAA NEGATIVE 09/10/2020     Cancer No results found for: CEA, CA125, LABCA2   Allergens No results found for: ALMOND, APPLE, ASPARAGUS, AVOCADO, BANANA, BARLEY, BASIL, BAYLEAF, GREENBEAN, LIMABEAN, WHITEBEAN, BEEFIGE, REDBEET, BLUEBERRY, BROCCOLI, CABBAGE, MELON, CARROT, CASEIN, CASHEWNUT, CAULIFLOWER, CELERY     Note: Lab results reviewed.  [place]  Drug: Ms. Hatton  reports no history of drug use. Alcohol:  reports no history of alcohol use.  Tobacco:  reports that she has never smoked. She has never used smokeless tobacco. Medical:  has a past medical history of Allergy, Anxiety, Arthritis, Chronic insomnia, Colon adenoma, Dyslipidemia, Dysphagia, Female stress incontinence, GERD (gastroesophageal reflux disease), Glaucoma, both eyes, Hyperlipidemia, Hypertension, Hypothyroidism, Menopause syndrome, Osteoporosis, Polyp of colon, Renal insufficiency, and Vitamin D deficiency. Family: family history includes Aneurysm in her brother; COPD in her mother and sister; Cancer in her father; Dementia in her mother; Hypertension in her mother; Hypothyroidism in her daughter; Osteoporosis in her mother.  Past Surgical History:  Procedure Laterality Date  . ABDOMINAL HYSTERECTOMY     21 years ago  . APPENDECTOMY    . COLONOSCOPY N/A 09/14/2020   Procedure: COLONOSCOPY;  Surgeon: Lesly Rubenstein, MD;  Location: [facility] ENDOSCOPY;  Service: Endoscopy;  Laterality: N/A;  . COLONOSCOPY WITH PROPOFOL N/A 05/29/2015   Procedure: COLONOSCOPY WITH PROPOFOL;  Surgeon: Hulen Luster, MD;  Location: [facility] ENDOSCOPY;  Service: Gastroenterology;  Laterality: N/A;  . ESOPHAGOGASTRODUODENOSCOPY N/A 05/29/2015   Procedure: ESOPHAGOGASTRODUODENOSCOPY (EGD);  Surgeon: Hulen Luster, MD;  Location: [facility] ENDOSCOPY;  Service: Gastroenterology;  Laterality: N/A;  . ESOPHAGOGASTRODUODENOSCOPY N/A  09/14/2020   Procedure: ESOPHAGOGASTRODUODENOSCOPY (EGD);  Surgeon: Lesly Rubenstein, MD;  Location: [facility] ENDOSCOPY;  Service: Endoscopy;  Laterality: N/A;  . EYE SURGERY     eye lids  . OVARIAN CYST REMOVAL     needed transfusion  . PHOTOCOAGULATION WITH LASER Right 11/21/2016   Procedure: PHOTOCOAGULATION WITH LASER;  Surgeon: Ronnell Freshwater, MD;  Location: [place];  Service: Ophthalmology;  Laterality: Right;  A micropulse probe was applied to each hemilimbus with the following settings: 20103m, 31.3% duty cycle, 90 seconds x 3 applications.    Active Ambulatory Problems    Diagnosis Date Noted  . Benign hypertension 08/21/2015  . Insomnia, persistent 08/21/2015  . Dyslipidemia 08/21/2015  . Gastro-esophageal reflux disease without esophagitis 08/21/2015  . Glaucoma 08/21/2015  . Blood glucose elevated 08/21/2015  . Adult hypothyroidism 08/21/2015  . Climacteric 08/21/2015  . Senile osteoporosis 08/21/2015  . Seasonal allergies 08/21/2015  . Female genuine stress incontinence 08/21/2015  . Vitamin D deficiency 08/21/2015  . Cystitis 10/12/2015  . Chronic kidney disease (CKD), stage III (moderate) ([place] 04/06/2016  . Primary open angle glaucoma of both eyes 11/21/2016  . Osteoarthritis of knees, bilateral 05/26/2017  . [name] deficiency 12/26/2018  . Lichen sclerosus et atrophicus of the vulva 11/04/2019  . DDD (degenerative disc disease), cervical 06/25/2020  . Cervical radiculitis 12/01/2020  . Bilateral hip pain 12/01/2020  . Chronic bilateral low back pain without sciatica 12/01/2020  . Chronic SI joint pain 12/01/2020  . Chronic pain syndrome 12/01/2020   Resolved Ambulatory Problems    Diagnosis Date Noted  . Anxiety state 08/21/2015  . Screening for gout 08/21/2015   Past Medical History:  Diagnosis Date  . Allergy   . Anxiety   . Arthritis   . Chronic insomnia   . Colon adenoma   . Dysphagia   . Female stress incontinence   . GERD  (gastroesophageal reflux disease)   . Glaucoma, both eyes   . Hyperlipidemia   . Hypertension   . Hypothyroidism   . Menopause syndrome   . Osteoporosis   . Polyp of colon   . Renal insufficiency    Constitutional Exam  General appearance: Well nourished, well developed, and well hydrated. In no apparent acute distress Vitals:   12/01/20 1252  BP: (!) 188/91  Pulse: (!) 102  Resp: 20  Temp: 97.7 F (36.5 C)  SpO2: 98%  Weight: 180 lb (81.6 kg)  Height: '5\' 5"'  (1.651 m)   BMI Assessment: Estimated body mass index is 29.95 kg/m as calculated from the following:   Height as of this encounter: '5\' 5"'  (1.651 m).   Weight as of this encounter: 180 lb (81.6 kg).  BMI interpretation table: BMI level Category Range association with higher incidence of chronic pain  <18 kg/m2 Underweight   18.5-24.9 kg/m2 Ideal body weight   25-29.9 kg/m2 Overweight Increased incidence by 20%  30-34.9 kg/m2 Obese (Class I) Increased incidence by 68%  35-39.9 kg/m2 Severe obesity (Class II) Increased incidence by 136%  >40 kg/m2 Extreme obesity (Class III) Increased incidence by 254%   Patient's current BMI Ideal Body weight  Body mass index is 29.95 kg/m. Ideal body weight: 57 kg (125 lb 10.6 oz) Adjusted ideal body weight: 66.9 kg (147 lb 6.4 oz)   BMI Readings from Last 4 Encounters:  12/01/20 29.95 kg/m  11/05/20 30.39 kg/m  10/19/20 29.79 kg/m  09/14/20 29.95 kg/m   Wt Readings from Last 4 Encounters:  12/01/20 180 lb (81.6 kg)  11/05/20 182 lb 9.6 oz (82.8 kg)  10/19/20 179 lb (81.2 kg)  09/14/20 180 lb (81.6 kg)    Psych/Mental status: Alert, oriented x 3 (person, place, & time)       Eyes: PERLA Respiratory: No evidence of acute respiratory distress  Cervical Spine Exam  Skin & Axial Inspection: No masses, redness, edema, swelling, or associated skin lesions Alignment: Symmetrical Functional ROM: Pain restricted ROM      Stability: No instability detected Muscle  Tone/Strength: Functionally intact. No obvious neuro-muscular anomalies detected. Sensory (Neurological): Dermatomal pain pattern Palpation: No palpable anomalies             Positive Spurling's on right Upper Extremity (UE) Exam    Side: Right upper extremity  Side: Left upper extremity  Skin & Extremity Inspection: Skin color, temperature, and hair growth are WNL. No peripheral edema or cyanosis. No masses, redness, swelling, asymmetry, or associated skin lesions. No contractures.  Skin & Extremity Inspection: Skin color, temperature, and hair growth are WNL. No peripheral edema or cyanosis. No masses, redness, swelling, asymmetry, or associated skin lesions. No contractures.  Functional ROM: Pain restricted ROM for shoulder and elbow  Functional ROM: Unrestricted ROM          Muscle Tone/Strength: Functionally intact. No obvious neuro-muscular anomalies detected.   Muscle Tone/Strength: Functionally intact. No obvious neuro-muscular anomalies detected.  Sensory (Neurological): Neuropathic pain pattern          Sensory (Neurological): Unimpaired          Palpation: No palpable anomalies              Palpation: No palpable anomalies              Provocative Test(s):  Phalen's test: deferred Tinel's test: deferred Apley's scratch test (touch opposite shoulder):  Action 1 (Across chest): Decreased ROM Action 2 (Overhead): Decreased ROM Action 3 (LB reach): Decreased ROM   Provocative Test(s):  Phalen's test: deferred Tinel's test: deferred Apley's scratch test (touch opposite shoulder):  Action 1 (Across chest): deferred Action 2 (Overhead): deferred Action 3 (LB reach): deferred     Lumbar Exam  Skin & Axial Inspection: No masses, redness, or swelling Alignment: Symmetrical Functional ROM: Unrestricted ROM       Stability: No instability detected Muscle Tone/Strength: Functionally intact. No obvious neuro-muscular  anomalies detected. Sensory (Neurological): Musculoskeletal pain  pattern Palpation: No palpable anomalies       Provocative Tests: Hyperextension/rotation test: deferred today       Lumbar quadrant test (Kemp's test): deferred today       Lateral bending test: deferred today       Patrick's Maneuver: deferred today                   FABER* test: (+) for right-sided S-I arthralgia              Gait & Posture Assessment  Ambulation: Unassisted Gait: Relatively normal for age and body habitus Posture: WNL   Lower Extremity Exam    Side: Right lower extremity  Side: Left lower extremity  Stability: No instability observed          Stability: No instability observed          Skin & Extremity Inspection: Skin color, temperature, and hair growth are WNL. No peripheral edema or cyanosis. No masses, redness, swelling, asymmetry, or associated skin lesions. No contractures.  Skin & Extremity Inspection: Skin color, temperature, and hair growth are WNL. No peripheral edema or cyanosis. No masses, redness, swelling, asymmetry, or associated skin lesions. No contractures.  Functional ROM: Unrestricted ROM                  Functional ROM: Unrestricted ROM                  Muscle Tone/Strength: Functionally intact. No obvious neuro-muscular anomalies detected.  Muscle Tone/Strength: Functionally intact. No obvious neuro-muscular anomalies detected.  Sensory (Neurological): Unimpaired        Sensory (Neurological): Unimpaired        DTR: Patellar: deferred today Achilles: deferred today Plantar: deferred today  DTR: Patellar: deferred today Achilles: deferred today Plantar: deferred today  Palpation: No palpable anomalies  Palpation: No palpable anomalies   Assessment  Primary Diagnosis & Pertinent Problem List: The primary encounter diagnosis was Cervical radicular pain. Diagnoses of Cervical radiculopathy at C6 (right), Chronic bilateral low back pain without sciatica, Bilateral hip pain, Chronic SI joint pain, Cervical radiculitis, and Chronic pain syndrome  were also pertinent to this visit.  Visit Diagnosis (New problems to examiner): 1. Cervical radicular pain   2. Cervical radiculopathy at C6 (right)   3. Chronic bilateral low back pain without sciatica   4. Bilateral hip pain   5. Chronic SI joint pain   6. Cervical radiculitis   7. Chronic pain syndrome   General Recommendations: The pain condition that the patient suffers from is best treated with a multidisciplinary approach that involves an increase in physical activity to prevent de-conditioning and worsening of the pain cycle, as well as psychological counseling (formal and/or informal) to address the co-morbid psychological affects of pain. Treatment will often involve judicious use of pain medications and interventional procedures to decrease the pain, allowing the patient to participate in the physical activity that will ultimately produce long-lasting pain reductions. The goal of the multidisciplinary approach is to return the patient to a higher level of overall function and to restore their ability to perform activities of daily living.   I reviewed the patient's cervical MRI with her in great detail.  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  I recommended that she continue physical therapy.  Recommend increasing her Lyrica as below.  Will transition from baclofen to tizanidine to  help out with cervical paraspinal muscle spasms.  Risks and benefits reviewed for right cervical epidural steroid injection under fluoroscopy.  Patient like to proceed.  Regards to her low back pain, right hip pain, right SI joint pain, will obtain x-rays of bilateral hips, SI joints and lumbar spine.  When she follows up for cervical epidural steroid injection, I will review her x-ray imaging with her and we will discuss treatment plan for associated pain symptoms.   Plan of Care (Initial workup plan)   Imaging Orders     DG  HIP UNILAT W OR W/O PELVIS 2-3 VIEWS LEFT     DG HIP UNILAT W OR W/O PELVIS 2-3 VIEWS RIGHT     DG Lumbar Spine Complete W/Bend     DG Si Joints  Procedure Orders     Cervical Epidural Injection Pharmacotherapy (current): Medications ordered:  Meds ordered this encounter  Medications  . pregabalin (LYRICA) 50 MG capsule    Sig: 50 mg during the day, 100 mg at night    Dispense:  90 capsule    Refill:  0  . tiZANidine (ZANAFLEX) 4 MG tablet    Sig: Take 0.5-1 tablets (2-4 mg total) by mouth at bedtime as needed for muscle spasms.    Dispense:  30 tablet    Refill:  1    Do not place this medication, or any other prescription from our practice, on "Automatic Refill". Patient may have prescription filled one day early if pharmacy is closed on scheduled refill date.   Medications administered during this visit: Shakevia Sarris. [place] had no medications administered during this visit.   Pharmacological management options:  Opioid Analgesics: The patient was informed that there is no guarantee that she would be a candidate for opioid analgesics. The decision will be made following CDC guidelines. This decision will be based on the results of diagnostic studies, as well as Ms. Parke's risk profile.   Membrane stabilizer: Adequate regimen continue Lyrica as prescribed, recommend increasing nighttime dose to 100 mg nightly.  Muscle relaxant: Sedation with Flexeril.  Trial of tizanidine as above.  Stop baclofen during tizanidine trial.  NSAID: Adequate regimen  Other analgesic(s): To be determined at a later time   Interventional management options: Ms. Rafalski was informed that there is no guarantee that she would be a candidate for interventional therapies. The decision will be based on the results of diagnostic studies, as well as Ms. Steinhauser's risk profile.  Procedure(s) under consideration:  Cervical epidural steroid injection   Provider-requested follow-up: Return in about 2 weeks (around  12/15/2020) for Right C7/T1 ESI with PO valium.  Future Appointments  Date Time Provider [place]  12/07/2020 11:00 AM Steele Sizer, MD [facility]  12/21/2020  1:00 PM Gillis Santa, MD [facility] None  03/08/2021  1:40 PM Steele Sizer, MD [place] [facility]  09/14/2021  3:30 PM [facility] - NURSE HEALTH ADVISOR [facility]    Note by: Gillis Santa, MD Date: 12/01/2020; Time: 2:20 PM

## 2020-12-01 NOTE — Progress Notes (Signed)
Safety precautions to be maintained throughout the outpatient stay will include: orient to surroundings, keep bed in low position, maintain call bell within reach at all times, provide assistance with transfer out of bed and ambulation.  

## 2020-12-03 DIAGNOSIS — M5412 Radiculopathy, cervical region: Secondary | ICD-10-CM | POA: Diagnosis not present

## 2020-12-04 NOTE — Progress Notes (Signed)
Name: Dawn Fuentes   MRN: [id number]    DOB: 1946-05-04   Date:12/07/2020       Progress Note  Subjective  Chief Complaint  Annual exam   HPI  Patient presents for annual CPE and follow up  GAD: she is feeling much better on Citalopram 20 mg since last visit  Takes alprazolam now. She is happy with medications . Denies anhedonia and is doing better since pain under better control  HTN: bp at home is 120's Currently on Losartan 50 mg and HCTZ. She denies chest pain or palpitation, denies edema, no dizziness.No longer needs to take hydralazine prn   CKI: we will monitor, sheisavoiding takingNSAID's, last GFR was stable at 56. She is only taking Tylenol now   Insomnia: she is taking Ambien5 mg at night, it is helping her fall and stay asleep,she has been doing well on medication, we give her medication for 6 months   GERD: unable to wean self off PPI, taking medication daily, EGD done in 2021   Osteoporosis: she took fosamax long time ago, and is afraid to go back on therapy,shegets prolia, had a fracture of right footin 2020had to wear a boot , she still has intermittent pain on right foot.She is taking vitamin D supplementation  Hypothyroidism: she is taking medication as prescribed, no hair loss, palpitation or change in bowel movementsLast TSH at goal . She is taking two pills M, W and Fridays and one daily after that   DDD cervical spine with radiculitis, seen by Dr. Trenton Gammon and started having PT at [place] last week and she has noticed improvement of frequency of headaches, she still has radiculitis going down to upper arm but only when rotating to right side. She also had a visit with Dr. Holley Raring and will have steroid injection on cervical spine January 2021 She also has intermittent lower back pain that is intermittent , she has been doing some stretching and has helped also discussed it with Dr. Holley Raring She is taking higher dose of Lyrica 100 mg at night and 50 mg  in the morning   Left groin pain: she has noticed pain on left groin when walking and with some rom pain is described as sharp, no bowel or bladder incontinence She had x-rays done by Dr. Holley Raring but not arthropathy   Nausea : it happened last week, felt nausea, decrease in appetite, took Petobismol , no vomiting or diarrhea, feeling better now  Diet: balanced diet  Exercise: discussed 150 minutes per week   [place] [facility] Visit from 06/24/2020 in [facility]  AUDIT-C Score 0     Depression: Phq 9 is  negative Depression screen [facility] 2/9 12/07/2020 12/01/2020 11/05/2020 10/19/2020 09/10/2020  Decreased Interest 0 0 1 0 0  Down, Depressed, Hopeless 0 0 1 0 0  PHQ - 2 Score 0 0 2 0 0  Altered sleeping 0 - 1 - -  Tired, decreased energy 0 - 1 - -  Change in appetite 0 - 0 - -  Feeling bad or failure about yourself  0 - 0 - -  Trouble concentrating 0 - 0 - -  Moving slowly or fidgety/restless 0 - 0 - -  Suicidal thoughts 0 - 0 - -  PHQ-9 Score 0 - 4 - -  Difficult doing work/chores Not difficult at all - Not difficult at all - -  Some recent data might be hidden   Hypertension: BP Readings from Last 3 Encounters:  12/07/20 (!) 142/88  12/01/20 (!) 188/91  11/05/20 (!) 148/78   Obesity: Wt Readings from Last 3 Encounters:  12/07/20 180 lb 9.6 oz (81.9 kg)  12/01/20 180 lb (81.6 kg)  11/05/20 182 lb 9.6 oz (82.8 kg)   BMI Readings from Last 3 Encounters:  12/07/20 30.05 kg/m  12/01/20 29.95 kg/m  11/05/20 30.39 kg/m     Vaccines:   Shingrix: up to date  Pneumonia: Completed, educated and discussed with patient. Flu: 09/04/2020 COVID-19 booster is due: advised Moderma   Hep C Screening: 02/14/2013 STD testing and prevention (HIV/chl/gon/syphilis): refused Intimate partner violence: negative screen  Sexual History : sexually active , new partner, discussed pap smear but she wants to hold off  Menstrual History/LMP/Abnormal Bleeding: discussed  post-menopausal bleeding  Incontinence Symptoms: no problems   Breast cancer:  - Last Mammogram: 09/10/2018 - BRCA gene screening: N/A  Osteoporosis: Discussed high calcium and vitamin D supplementation, weight bearing exercises  Cervical cancer screening: N/A  Skin cancer: Discussed monitoring for atypical lesions  Colorectal cancer: 09/14/2020  Lung cancer:  Low Dose CT Chest recommended if Age 29-80 years, 20 pack-year currently smoking OR have quit w/in 15years.  ECG: 09/18/2016  Advanced Care Planning: A voluntary discussion about advance care planning including the explanation and discussion of advance directives.  Discussed health care proxy and Living will, and the patient was able to identify a health care proxy as daughter .  Patient does have a living will at present time.   Lipids: Lab Results  Component Value Date   CHOL 182 03/18/2020   CHOL 159 05/21/2019   CHOL 163 03/13/2018   Lab Results  Component Value Date   HDL 56 03/18/2020   HDL 53 05/21/2019   HDL 56 03/13/2018   Lab Results  Component Value Date   LDLCALC 102 (H) 03/18/2020   LDLCALC 85 05/21/2019   LDLCALC 79 03/13/2018   Lab Results  Component Value Date   TRIG 140 03/18/2020   TRIG 111 05/21/2019   TRIG 179 (H) 03/13/2018   Lab Results  Component Value Date   CHOLHDL 3.3 03/18/2020   CHOLHDL 3.0 05/21/2019   CHOLHDL 2.9 03/13/2018   No results found for: LDLDIRECT  Glucose: Glucose, Bld  Date Value Ref Range Status  10/19/2020 99 65 - 99 mg/dL Final    Comment:    .            Fasting reference interval .   03/18/2020 111 (H) 65 - 99 mg/dL Final    Comment:    .            Fasting reference interval . For someone without known diabetes, a glucose value between 100 and 125 mg/dL is consistent with prediabetes and should be confirmed with a follow-up test. .   05/21/2019 106 (H) 65 - 99 mg/dL Final    Comment:    .            Fasting reference interval . For  someone without known diabetes, a glucose value between 100 and 125 mg/dL is consistent with prediabetes and should be confirmed with a follow-up test. .    Glucose-Capillary  Date Value Ref Range Status  09/17/2016 114 (H) 65 - 99 mg/dL Final    Patient Active Problem List   Diagnosis Date Noted  . Cervical radiculitis 12/01/2020  . Bilateral hip pain 12/01/2020  . Chronic bilateral low back pain without sciatica 12/01/2020  . Chronic SI joint pain 12/01/2020  .  Chronic pain syndrome 12/01/2020  . DDD (degenerative disc disease), cervical 06/25/2020  . Lichen sclerosus et atrophicus of the vulva 11/04/2019  . [name] deficiency 12/26/2018  . Osteoarthritis of knees, bilateral 05/26/2017  . Primary open angle glaucoma of both eyes 11/21/2016  . Chronic kidney disease (CKD), stage III (moderate) ([place]) 04/06/2016  . Cystitis 10/12/2015  . Benign hypertension 08/21/2015  . Insomnia, persistent 08/21/2015  . Dyslipidemia 08/21/2015  . Gastro-esophageal reflux disease without esophagitis 08/21/2015  . Glaucoma 08/21/2015  . Blood glucose elevated 08/21/2015  . Adult hypothyroidism 08/21/2015  . Climacteric 08/21/2015  . Senile osteoporosis 08/21/2015  . Seasonal allergies 08/21/2015  . Female genuine stress incontinence 08/21/2015  . Vitamin D deficiency 08/21/2015    Past Surgical History:  Procedure Laterality Date  . ABDOMINAL HYSTERECTOMY     21 years ago  . APPENDECTOMY    . COLONOSCOPY N/A 09/14/2020   Procedure: COLONOSCOPY;  Surgeon: Lesly Rubenstein, MD;  Location: [facility] ENDOSCOPY;  Service: Endoscopy;  Laterality: N/A;  . COLONOSCOPY WITH PROPOFOL N/A 05/29/2015   Procedure: COLONOSCOPY WITH PROPOFOL;  Surgeon: Hulen Luster, MD;  Location: [facility] ENDOSCOPY;  Service: Gastroenterology;  Laterality: N/A;  . ESOPHAGOGASTRODUODENOSCOPY N/A 05/29/2015   Procedure: ESOPHAGOGASTRODUODENOSCOPY (EGD);  Surgeon: Hulen Luster, MD;  Location: [facility] ENDOSCOPY;  Service: Gastroenterology;   Laterality: N/A;  . ESOPHAGOGASTRODUODENOSCOPY N/A 09/14/2020   Procedure: ESOPHAGOGASTRODUODENOSCOPY (EGD);  Surgeon: Lesly Rubenstein, MD;  Location: [facility] ENDOSCOPY;  Service: Endoscopy;  Laterality: N/A;  . EYE SURGERY     eye lids  . OVARIAN CYST REMOVAL     needed transfusion  . PHOTOCOAGULATION WITH LASER Right 11/21/2016   Procedure: PHOTOCOAGULATION WITH LASER;  Surgeon: Ronnell Freshwater, MD;  Location: [facility];  Service: Ophthalmology;  Laterality: Right;  A micropulse probe was applied to each hemilimbus with the following settings: 2027m, 31.3% duty cycle, 90 seconds x 3 applications.     Family History  Problem Relation Age of Onset  . Hypertension Mother   . Osteoporosis Mother   . COPD Mother   . Dementia Mother   . Cancer Father        Colon  . Hypothyroidism Daughter   . Aneurysm Brother   . COPD Sister     Social History   Socioeconomic History  . Marital status: Widowed    Spouse name: DShanon Brow . Number of children: 1  . Years of education: Not on file  . Highest education level: 12th grade  Occupational History  . Occupation: Retired  Tobacco Use  . Smoking status: Never Smoker  . Smokeless tobacco: Never Used  . Tobacco comment: smoking cessation materials not required  Vaping Use  . Vaping Use: Never used  Substance and Sexual Activity  . Alcohol use: No    Alcohol/week: 0.0 standard drinks  . Drug use: No  . Sexual activity: Yes    Partners: Male    Birth control/protection: Post-menopausal  Other Topics Concern  . Not on file  Social History Narrative   Widow since 2013   Started to date in 2019   Social Determinants of Health   Financial Resource Strain: Low Risk   . Difficulty of Paying Living Expenses: Not hard at all  Food Insecurity: No Food Insecurity  . Worried About RCharity fundraiserin the Last Year: Never true  . Ran Out of Food in the Last Year: Never true  Transportation Needs: No Transportation Needs   . Lack of  Transportation (Medical): No  . Lack of Transportation (Non-Medical): No  Physical Activity: Insufficiently Active  . Days of Exercise per Week: 2 days  . Minutes of Exercise per Session: 10 min  Stress: No Stress Concern Present  . Feeling of Stress : Not at all  Social Connections: Moderately Integrated  . Frequency of Communication with Friends and Family: More than three times a week  . Frequency of Social Gatherings with Friends and Family: More than three times a week  . Attends Religious Services: More than 4 times per year  . Active Member of Clubs or Organizations: Yes  . Attends Archivist Meetings: More than 4 times per year  . Marital Status: Widowed  Intimate Partner Violence: Not At Risk  . Fear of Current or Ex-Partner: No  . Emotionally Abused: No  . Physically Abused: No  . Sexually Abused: No     Current Outpatient Medications:  .  ALPRAZolam (XANAX) 0.5 MG tablet, Take 1 tablet (0.5 mg total) by mouth daily as needed for anxiety. Rarely, Disp: 10 tablet, Rfl: 0 .  aspirin 81 MG tablet, Take 81 mg by mouth daily. pm, Disp: , Rfl:  .  busPIRone (BUSPAR) 5 MG tablet, Take 1 tablet (5 mg total) by mouth 3 (three) times daily as needed. For anxiety, Disp: 30 tablet, Rfl: 0 .  cholecalciferol (VITAMIN D) 1000 units tablet, Take 1,000 Units by mouth 2 (two) times daily. am, Disp: , Rfl:  .  citalopram (CELEXA) 20 MG tablet, Take 1 tablet (20 mg total) by mouth daily., Disp: 90 tablet, Rfl: 0 .  clobetasol ointment (TEMOVATE) 0.05 %, Apply to affected area every night for 4 weeks, then every other day for 4 weeks and then twice a week for 4 weeks or until resolution., Disp: 30 g, Rfl: 5 .  denosumab (PROLIA) 60 MG/ML SOSY injection, Inject into the skin every 6 (six) months. , Disp: , Rfl:  .  fluticasone (FLONASE) 50 MCG/ACT nasal spray, Place 2 sprays into both nostrils daily. Pm, Disp: 16 g, Rfl: 2 .  hydrALAZINE (APRESOLINE) 25 MG tablet, TAKE 1  TABLET BY MOUTH THREE TIMES DAILY; IF BLOOD PRESSURE ABOVE 140/90, Disp: 270 tablet, Rfl: 0 .  hydrochlorothiazide (HYDRODIURIL) 12.5 MG tablet, Take 1 tablet (12.5 mg total) by mouth daily., Disp: 90 tablet, Rfl: 0 .  levocetirizine (XYZAL) 5 MG tablet, Take 1 tablet (5 mg total) by mouth every evening., Disp: 90 tablet, Rfl: 1 .  levothyroxine (SYNTHROID) 50 MCG tablet, Take 1-2 tablets (50-100 mcg total) by mouth daily before breakfast. Two M, W, F and one other days of the week, Disp: 120 tablet, Rfl: 0 .  losartan (COZAAR) 100 MG tablet, Take 1 tablet (100 mg total) by mouth daily. TAKE 1 TABLET BY MOUTH DAILY, Disp: 90 tablet, Rfl: 0 .  montelukast (SINGULAIR) 10 MG tablet, Take 1 tablet (10 mg total) by mouth daily as needed (allergies)., Disp: 90 tablet, Rfl: 1 .  Omega-3 Fatty Acids (FISH OIL PO), Take by mouth. AM, Disp: , Rfl:  .  pantoprazole (PROTONIX) 40 MG tablet, TAKE 1 TABLET(40 MG) BY MOUTH DAILY, Disp: 90 tablet, Rfl: 2 .  pregabalin (LYRICA) 50 MG capsule, 50 mg during the day, 100 mg at night, Disp: 90 capsule, Rfl: 0 .  simvastatin (ZOCOR) 40 MG tablet, Take 1 tablet (40 mg total) by mouth daily., Disp: 90 tablet, Rfl: 1 .  timolol (TIMOPTIC) 0.5 % ophthalmic solution, INT 1 GTT IN OU BID,  Disp: , Rfl: 5 .  VYZULTA 0.024 % SOLN, INT 1 GTT INTO OU AT NIGHT, Disp: , Rfl: 2 .  zolpidem (AMBIEN) 5 MG tablet, Take qhs, Disp: 90 tablet, Rfl: 1 .  tiZANidine (ZANAFLEX) 4 MG tablet, Take 0.5-1 tablets (2-4 mg total) by mouth at bedtime as needed for muscle spasms. (Patient not taking: Reported on 12/07/2020), Disp: 30 tablet, Rfl: 1  Current Facility-Administered Medications:  .  cyanocobalamin ((VITAMIN B-12)) injection 1,000 mcg, 1,000 mcg, Intramuscular, Q30 days, Steele Sizer, MD, 1,000 mcg at 11/05/20 1056  Allergies  Allergen Reactions  . Brimonidine Tartrate   . Latanoprost   . Meloxicam     Increased blood pressure   . Raloxifene     bone pain      ROS  Constitutional: Negative for fever or weight change.  Respiratory: Negative for cough and shortness of breath.   Cardiovascular: Negative for chest pain or palpitations.  Gastrointestinal: Negative for abdominal pain, no bowel changes.  Musculoskeletal: Negative for gait problem or joint swelling.  Skin: Negative for rash.  Neurological: Negative for dizziness or headache.  No other specific complaints in a complete review of systems (except as listed in HPI above).   Objective  Vitals:   12/07/20 1052  BP: (!) 142/88  Pulse: 91  Resp: 16  Temp: 98.1 F (36.7 C)  TempSrc: Oral  SpO2: 98%  Weight: 180 lb 9.6 oz (81.9 kg)  Height: '5\' 5"'  (1.651 m)    Body mass index is 30.05 kg/m.  Physical Exam  Constitutional: Patient appears well-developed and well-nourished. No distress.  HENT: Head: Normocephalic and atraumatic. Ears: B TMs ok, no erythema or effusion; Nose: Nose not donel. Mouth/Throat: not done   Eyes: Conjunctivae and EOM are normal. Pupils are equal, round, and reactive to light. No scleral icterus.  Neck: Normal range of motion. Neck supple. No JVD present. No thyromegaly present.  Cardiovascular: Normal rate, regular rhythm and normal heart sounds.  No murmur heard. No BLE edema. Pulmonary/Chest: Effort normal and breath sounds normal. No respiratory distress. Abdominal: Soft. Bowel sounds are normal, no distension. There is no tenderness. no masses Breast: no lumps or masses, no nipple discharge or rashes FEMALE GENITALIA:  Not done  RECTAL: not done  Musculoskeletal: Normal range of motion, no joint effusions. No gross deformities Neurological: he is alert and oriented to person, place, and time. No cranial nerve deficit. Coordination, balance, strength, speech and gait are normal.  Skin: Skin is warm and dry. No rash noted. No erythema.  Psychiatric: Patient has a normal mood and affect. behavior is normal. Judgment and thought content  normal.  Recent Results (from the past 2160 hour(s))  SARS CORONAVIRUS 2 (TAT 6-24 HRS) Nasopharyngeal Nasopharyngeal Swab     Status: None   Collection Time: 09/10/20 11:48 AM   Specimen: Nasopharyngeal Swab  Result Value Ref Range   SARS Coronavirus 2 NEGATIVE NEGATIVE    Comment: (NOTE) SARS-CoV-2 target nucleic acids are NOT DETECTED.  The SARS-CoV-2 RNA is generally detectable in upper and lower respiratory specimens during the acute phase of infection. Negative results do not preclude SARS-CoV-2 infection, do not rule out co-infections with other pathogens, and should not be used as the sole basis for treatment or other patient management decisions. Negative results must be combined with clinical observations, patient history, and epidemiological information. The expected result is Negative.  Fact Sheet for Patients: SugarRoll.be  Fact Sheet for Healthcare Providers: https://www.woods-mathews.com/  This test is not yet approved  or cleared by the [place] and  has been authorized for detection and/or diagnosis of SARS-CoV-2 by FDA under an Emergency Use Authorization (EUA). This EUA will remain  in effect (meaning this test can be used) for the duration of the COVID-19 declaration under Se ction 564(b)(1) of the Act, 21 U.S.C. section 360bbb-3(b)(1), unless the authorization is terminated or revoked sooner.  Performed at [facility], [place] [address] [place], [place] [address]   Microalbumin / creatinine urine ratio     Status: Abnormal   Collection Time: 10/19/20 12:22 PM  Result Value Ref Range   Creatinine, Urine 15 (L) 20 - 275 mg/dL   Microalb, Ur <0.2 mg/dL    Comment: Reference Range Not established    Microalb Creat Ratio NOTE <30 mcg/mg creat    Comment: NOTE: The urine albumin value is less than  0.2 mg/dL therefore we are unable to calculate  excretion and/or creatinine ratio. . The ADA defines  abnormalities in albumin excretion as follows: Marland Kitchen Albuminuria Category        Result (mcg/mg creatinine) . Normal to Mildly increased   <30 Moderately increased         30-299  Severely increased           > OR = 300 . The ADA recommends that at least two of three specimens collected within a 3-6 month period be abnormal before considering a patient to be within a diagnostic category.   COMPLETE METABOLIC PANEL WITH GFR     Status: Abnormal   Collection Time: 10/19/20 12:22 PM  Result Value Ref Range   Glucose, Bld 99 65 - 99 mg/dL    Comment: .            Fasting reference interval .    BUN 15 7 - 25 mg/dL   Creat 1.13 (H) 0.60 - 0.93 mg/dL    Comment: For patients >33 years of age, the reference limit for Creatinine is approximately 13% higher for people identified as African-American. .    GFR, Est Non African American 49 (L) > OR = 60 mL/min/1.61m   GFR, Est African American 56 (L) > OR = 60 mL/min/1.762m  BUN/Creatinine Ratio 13 6 - 22 (calc)   Sodium 138 135 - 146 mmol/L   Potassium 4.5 3.5 - 5.3 mmol/L   Chloride 104 98 - 110 mmol/L   CO2 25 20 - 32 mmol/L   Calcium 9.9 8.6 - 10.4 mg/dL   Total Protein 7.4 6.1 - 8.1 g/dL   Albumin 4.8 3.6 - 5.1 g/dL   Globulin 2.6 1.9 - 3.7 g/dL (calc)   AG Ratio 1.8 1.0 - 2.5 (calc)   Total Bilirubin 0.5 0.2 - 1.2 mg/dL   Alkaline phosphatase (APISO) 57 37 - 153 U/L   AST 53 (H) 10 - 35 U/L   ALT 54 (H) 6 - 29 U/L  TSH     Status: Abnormal   Collection Time: 10/19/20 12:22 PM  Result Value Ref Range   TSH 9.26 (H) 0.40 - 4.50 mIU/L  TSH     Status: None   Collection Time: 11/30/20  2:41 PM  Result Value Ref Range   TSH 0.56 0.40 - 4.50 mIU/L      Fall Risk: Fall Risk  12/07/2020 12/01/2020 11/05/2020 10/19/2020 09/10/2020  Falls in the past year? 0 0 0 0 0  Number falls in past yr: 0 0 0 0 0  Injury with Fall? 0 0  0 0 0  Risk for fall due to : - - - - No Fall Risks  Risk for fall due to: Comment - - - - -  Follow  up - - - - Follow up appointment     Assessment & Plan  1. GAD (generalized anxiety disorder)  - citalopram (CELEXA) 20 MG tablet; Take 1 tablet (20 mg total) by mouth daily.  Dispense: 90 tablet; Refill: 0  2. Benign hypertension  - losartan (COZAAR) 100 MG tablet; Take 1 tablet (100 mg total) by mouth daily. TAKE 1 TABLET BY MOUTH DAILY  Dispense: 90 tablet; Refill: 0 - hydrochlorothiazide (HYDRODIURIL) 12.5 MG tablet; Take 1 tablet (12.5 mg total) by mouth daily.  Dispense: 90 tablet; Refill: 0  3. Other specified hypothyroidism   4. Well adult exam   5. Cervical radiculitis  Under the care of Dr. Holley Raring and also getting PT  6. Adult hypothyroidism  - levothyroxine (SYNTHROID) 50 MCG tablet; Take 1-2 tablets (50-100 mcg total) by mouth daily before breakfast. Two M, W, F and one other days of the week  Dispense: 120 tablet; Refill: 0  7. Dyslipidemia   8. Vitamin [name] deficiency   9. Insomnia, persistent   10. Vitamin D deficiency   11. Stage 3a chronic kidney disease (HCC)  - losartan (COZAAR) 100 MG tablet; Take 1 tablet (100 mg total) by mouth daily. TAKE 1 TABLET BY MOUTH DAILY  Dispense: 90 tablet; Refill: 0  12. Gastroenteritis  Stable  -USPSTF grade A and B recommendations reviewed with patient; age-appropriate recommendations, preventive care, screening tests, etc discussed and encouraged; healthy living encouraged; see AVS for patient education given to patient -Discussed importance of 150 minutes of physical activity weekly, eat two servings of fish weekly, eat one serving of tree nuts ( cashews, pistachios, pecans, almonds.Marland Kitchen) every other day, eat 6 servings of fruit/vegetables daily and drink plenty of water and avoid sweet beverages.

## 2020-12-07 ENCOUNTER — Other Ambulatory Visit: Payer: Self-pay | Admitting: Family Medicine

## 2020-12-07 ENCOUNTER — Ambulatory Visit (INDEPENDENT_AMBULATORY_CARE_PROVIDER_SITE_OTHER): Payer: PPO | Admitting: Family Medicine

## 2020-12-07 ENCOUNTER — Other Ambulatory Visit: Payer: Self-pay

## 2020-12-07 ENCOUNTER — Encounter: Payer: Self-pay | Admitting: Family Medicine

## 2020-12-07 VITALS — BP 142/88 | HR 91 | Temp 98.1°F | Resp 16 | Ht 65.0 in | Wt 180.6 lb

## 2020-12-07 DIAGNOSIS — E038 Other specified hypothyroidism: Secondary | ICD-10-CM

## 2020-12-07 DIAGNOSIS — K529 Noninfective gastroenteritis and colitis, unspecified: Secondary | ICD-10-CM

## 2020-12-07 DIAGNOSIS — E039 Hypothyroidism, unspecified: Secondary | ICD-10-CM

## 2020-12-07 DIAGNOSIS — F411 Generalized anxiety disorder: Secondary | ICD-10-CM

## 2020-12-07 DIAGNOSIS — E785 Hyperlipidemia, unspecified: Secondary | ICD-10-CM | POA: Diagnosis not present

## 2020-12-07 DIAGNOSIS — E538 Deficiency of other specified B group vitamins: Secondary | ICD-10-CM | POA: Diagnosis not present

## 2020-12-07 DIAGNOSIS — I10 Essential (primary) hypertension: Secondary | ICD-10-CM | POA: Diagnosis not present

## 2020-12-07 DIAGNOSIS — N1831 Chronic kidney disease, stage 3a: Secondary | ICD-10-CM | POA: Diagnosis not present

## 2020-12-07 DIAGNOSIS — G4700 Insomnia, unspecified: Secondary | ICD-10-CM

## 2020-12-07 DIAGNOSIS — Z0000 Encounter for general adult medical examination without abnormal findings: Secondary | ICD-10-CM | POA: Diagnosis not present

## 2020-12-07 DIAGNOSIS — M5412 Radiculopathy, cervical region: Secondary | ICD-10-CM

## 2020-12-07 DIAGNOSIS — E559 Vitamin D deficiency, unspecified: Secondary | ICD-10-CM

## 2020-12-07 MED ORDER — HYDROCHLOROTHIAZIDE 12.5 MG PO TABS: 12.5000 mg | ORAL_TABLET | Freq: Every day | ORAL | 0 refills | Status: DC

## 2020-12-07 MED ORDER — CITALOPRAM HYDROBROMIDE 20 MG PO TABS: 20.0000 mg | ORAL_TABLET | Freq: Every day | ORAL | 0 refills | Status: DC

## 2020-12-07 MED ORDER — LOSARTAN POTASSIUM 100 MG PO TABS: 100.0000 mg | ORAL_TABLET | Freq: Every day | ORAL | 0 refills | Status: DC

## 2020-12-07 MED ORDER — LEVOTHYROXINE SODIUM 50 MCG PO TABS: 50.0000 ug | ORAL_TABLET | Freq: Every day | ORAL | 0 refills | Status: DC

## 2020-12-07 NOTE — Patient Instructions (Signed)
Preventive Care 72 Years and Older, Female Preventive care refers to lifestyle choices and visits with your health care provider that can promote health and wellness. This includes:  A yearly physical exam. This is also called an annual well check.  Regular dental and eye exams.  Immunizations.  Screening for certain conditions.  Healthy lifestyle choices, such as diet and exercise. What can I expect for my preventive care visit? Physical exam Your health care provider will check:  Height and weight. These may be used to calculate body mass index (BMI), which is a measurement that tells if you are at a healthy weight.  Heart rate and blood pressure.  Your skin for abnormal spots. Counseling Your health care provider may ask you questions about:  Alcohol, tobacco, and drug use.  Emotional well-being.  Home and relationship well-being.  Sexual activity.  Eating habits.  History of falls.  Memory and ability to understand (cognition).  Work and work Statistician.  Pregnancy and menstrual history. What immunizations do I need?  Influenza (flu) vaccine  This is recommended every year. Tetanus, diphtheria, and pertussis (Tdap) vaccine  You may need a Td booster every 10 years. Varicella (chickenpox) vaccine  You may need this vaccine if you have not already been vaccinated. Zoster (shingles) vaccine  You may need this after age 72. Pneumococcal conjugate (PCV13) vaccine  One dose is recommended after age 72. Pneumococcal polysaccharide (PPSV23) vaccine  One dose is recommended after age 72. Measles, mumps, and rubella (MMR) vaccine  You may need at least one dose of MMR if you were born in 1957 or later. You may also need a second dose. Meningococcal conjugate (MenACWY) vaccine  You may need this if you have certain conditions. Hepatitis A vaccine  You may need this if you have certain conditions or if you travel or work in places where you may be exposed  to hepatitis A. Hepatitis B vaccine  You may need this if you have certain conditions or if you travel or work in places where you may be exposed to hepatitis B. Haemophilus influenzae type b (Hib) vaccine  You may need this if you have certain conditions. You may receive vaccines as individual doses or as more than one vaccine together in one shot (combination vaccines). Talk with your health care provider about the risks and benefits of combination vaccines. What tests do I need? Blood tests  Lipid and cholesterol levels. These may be checked every 5 years, or more frequently depending on your overall health.  Hepatitis C test.  Hepatitis B test. Screening  Lung cancer screening. You may have this screening every year starting at age 72 if you have a 30-pack-year history of smoking and currently smoke or have quit within the past 15 years.  Colorectal cancer screening. All adults should have this screening starting at age 72 and continuing until age 15. Your health care provider may recommend screening at age 23 if you are at increased risk. You will have tests every 1-10 years, depending on your results and the type of screening test.  Diabetes screening. This is done by checking your blood sugar (glucose) after you have not eaten for a while (fasting). You may have this done every 1-3 years.  Mammogram. This may be done every 1-2 years. Talk with your health care provider about how often you should have regular mammograms.  BRCA-related cancer screening. This may be done if you have a family history of breast, ovarian, tubal, or peritoneal cancers.  Other tests  Sexually transmitted disease (STD) testing.  Bone density scan. This is done to screen for osteoporosis. You may have this done starting at age 72. Follow these instructions at home: Eating and drinking  Eat a diet that includes fresh fruits and vegetables, whole grains, lean protein, and low-fat dairy products. Limit  your intake of foods with high amounts of sugar, saturated fats, and salt.  Take vitamin and mineral supplements as recommended by your health care provider.  Do not drink alcohol if your health care provider tells you not to drink.  If you drink alcohol: ? Limit how much you have to 0-1 drink a day. ? Be aware of how much alcohol is in your drink. In the [place] one drink equals one 12 oz bottle of beer (355 mL), one 5 oz glass of wine (148 mL), or one 1 oz glass of hard liquor (44 mL). Lifestyle  Take daily care of your teeth and gums.  Stay active. Exercise for at least 30 minutes on 5 or more days each week.  Do not use any products that contain nicotine or tobacco, such as cigarettes, e-cigarettes, and chewing tobacco. If you need help quitting, ask your health care provider.  If you are sexually active, practice safe sex. Use a condom or other form of protection in order to prevent STIs (sexually transmitted infections).  Talk with your health care provider about taking a low-dose aspirin or statin. What's next?  Go to your health care provider once a year for a well check visit.  Ask your health care provider how often you should have your eyes and teeth checked.  Stay up to date on all vaccines. This information is not intended to replace advice given to you by your health care provider. Make sure you discuss any questions you have with your health care provider. Document Revised: 11/29/2018 Document Reviewed: 11/29/2018 Elsevier Patient Education  2020 [employer].

## 2020-12-08 DIAGNOSIS — M5412 Radiculopathy, cervical region: Secondary | ICD-10-CM | POA: Diagnosis not present

## 2020-12-10 DIAGNOSIS — M5412 Radiculopathy, cervical region: Secondary | ICD-10-CM | POA: Diagnosis not present

## 2020-12-21 ENCOUNTER — Ambulatory Visit (HOSPITAL_BASED_OUTPATIENT_CLINIC_OR_DEPARTMENT_OTHER): Payer: PPO | Admitting: Student in an Organized Health Care Education/Training Program

## 2020-12-21 ENCOUNTER — Other Ambulatory Visit: Payer: Self-pay

## 2020-12-21 ENCOUNTER — Encounter: Payer: Self-pay | Admitting: Student in an Organized Health Care Education/Training Program

## 2020-12-21 ENCOUNTER — Ambulatory Visit
Admission: RE | Admit: 2020-12-21 | Discharge: 2020-12-21 | Disposition: A | Discharge status: Home / Self Care | Payer: PPO | Source: Ambulatory Visit | Attending: Student in an Organized Health Care Education/Training Program | Admitting: Student in an Organized Health Care Education/Training Program

## 2020-12-21 VITALS — BP 127/77 | HR 87 | Temp 97.0°F | Resp 20 | Ht 65.0 in | Wt 180.0 lb

## 2020-12-21 DIAGNOSIS — G894 Chronic pain syndrome: Secondary | ICD-10-CM | POA: Insufficient documentation

## 2020-12-21 DIAGNOSIS — M5412 Radiculopathy, cervical region: Secondary | ICD-10-CM | POA: Diagnosis present

## 2020-12-21 MED ORDER — ROPIVACAINE HCL 2 MG/ML IJ SOLN
1.0000 mL | Freq: Once | INTRAMUSCULAR | Status: AC
  Administered 2020-12-21: 10 mL via EPIDURAL

## 2020-12-21 MED ORDER — IOHEXOL 180 MG/ML  SOLN
10.0000 mL | Freq: Once | INTRAMUSCULAR | Status: AC
  Administered 2020-12-21: 10 mL via EPIDURAL
  Filled 2020-12-21: qty 20

## 2020-12-21 MED ORDER — LIDOCAINE HCL 2 % IJ SOLN
20.0000 mL | Freq: Once | INTRAMUSCULAR | Status: AC
  Administered 2020-12-21: 200 mg

## 2020-12-21 MED ORDER — DIAZEPAM 5 MG PO TABS
5.0000 mg | ORAL_TABLET | Freq: Once | ORAL | Status: AC
  Administered 2020-12-21: 5 mg via ORAL

## 2020-12-21 MED ORDER — DIAZEPAM 5 MG PO TABS
ORAL_TABLET | ORAL | Status: AC
  Filled 2020-12-21: qty 1

## 2020-12-21 MED ORDER — DEXAMETHASONE SODIUM PHOSPHATE 10 MG/ML IJ SOLN
INTRAMUSCULAR | Status: AC
  Filled 2020-12-21: qty 1

## 2020-12-21 MED ORDER — DEXAMETHASONE SODIUM PHOSPHATE 10 MG/ML IJ SOLN
10.0000 mg | Freq: Once | INTRAMUSCULAR | Status: AC
  Administered 2020-12-21: 10 mg

## 2020-12-21 MED ORDER — LIDOCAINE HCL 2 % IJ SOLN
INTRAMUSCULAR | Status: AC
  Filled 2020-12-21: qty 20

## 2020-12-21 MED ORDER — SODIUM CHLORIDE 0.9% FLUSH
1.0000 mL | Freq: Once | INTRAVENOUS | Status: AC
  Administered 2020-12-21: 10 mL

## 2020-12-21 MED ORDER — SODIUM CHLORIDE (PF) 0.9 % IJ SOLN
INTRAMUSCULAR | Status: AC
  Filled 2020-12-21: qty 10

## 2020-12-21 MED ORDER — ROPIVACAINE HCL 2 MG/ML IJ SOLN
INTRAMUSCULAR | Status: AC
  Filled 2020-12-21: qty 10

## 2020-12-21 NOTE — Progress Notes (Signed)
Safety precautions to be maintained throughout the outpatient stay will include: orient to surroundings, keep bed in low position, maintain call bell within reach at all times, provide assistance with transfer out of bed and ambulation.  

## 2020-12-21 NOTE — Patient Instructions (Signed)

## 2020-12-21 NOTE — Progress Notes (Signed)
PROVIDER NOTE: Information contained herein reflects review and annotations entered in association with encounter. Interpretation of such information and data should be left to medically-trained personnel. Information provided to patient can be located elsewhere in the medical record under "Patient Instructions". Document created using STT-dictation technology, any transcriptional errors that may result from process are unintentional.    Patient: Dawn Fuentes  Service Category: Procedure  Provider: Edward Jolly, MD  DOB: [birth year]  DOS: 12/21/2020  Location: [facility]  MRN: [id number]  Setting: Ambulatory - outpatient  Referring Provider: Alba Cory, MD  Type: Established Patient  Specialty: Interventional Pain Management  PCP: Dawn Cory, MD    Primary Reason for Visit: Interventional Pain Management Treatment. CC: Neck Pain (Right side )  Procedure:          Anesthesia, Analgesia, Anxiolysis:  Type: Diagnostic, Inter-Laminar, Cervical Epidural Steroid Injection  #1  Region: Posterior Cervico-thoracic Region Level: C7-T1 Laterality: Right-Sided Paramedial  Type: Local Anesthesia with p.o. Valium Indication(s): Analgesia and Anxiety  Local Anesthetic: Lidocaine 1-2%  Position: Prone with head of the table was raised to facilitate breathing.   Indications: 1. Cervical radicular pain   2. Cervical radiculopathy at C6 (right)   3. Cervical radiculitis   4. Chronic pain syndrome    Pain Score: Pre-procedure: 5 /10 Post-procedure: 3 /10   EXAM: MRI CERVICAL SPINE WITHOUT CONTRAST  TECHNIQUE: Multiplanar, multisequence MR imaging of the cervical spine was performed. No intravenous contrast was administered.  COMPARISON:  Cervical spine radiographs 06/24/2020  FINDINGS: Alignment: Cervical spine straightening.  No significant listhesis.  Vertebrae: No fracture, suspicious osseous lesion, or significant marrow edema. Mild chronic degenerative  endplate changes at C5-6 and C6-7 associated with moderate disc space narrowing, greater at C6-7.  Cord: Normal signal and morphology.  Posterior Fossa, vertebral arteries, paraspinal tissues: Unremarkable.  Disc levels:  C2-3: Negative.  C3-4: Disc bulging eccentric to the right and mild facet arthrosis result in mild right neural foraminal stenosis without spinal stenosis.  C4-5: Mild disc bulging results in borderline to mild right neural foraminal stenosis without spinal stenosis.  C5-6: Disc bulging greater to the right and mild uncovertebral spurring result in moderate right neural foraminal stenosis with potential right C6 nerve root impingement. No spinal stenosis.  C6-7: Broad-based posterior disc osteophyte complex without significant stenosis.  C7-T1: Mild disc bulging without stenosis.  IMPRESSION: 1. Cervical disc degeneration most notable at C5-6 where there is moderate right neural foraminal stenosis. 2. No spinal stenosis.  Pre-op H&P Assessment:  Dawn Fuentes is a 75 y.o. (year old), female patient, seen today for interventional treatment. She  has a past surgical history that includes Ovarian cyst removal; Colonoscopy with propofol (N/A, 05/29/2015); Esophagogastroduodenoscopy (N/A, 05/29/2015); Appendectomy; Abdominal hysterectomy; Eye surgery; Photocoagulation with laser (Right, 11/21/2016); Esophagogastroduodenoscopy (N/A, 09/14/2020); and Colonoscopy (N/A, 09/14/2020). Dawn Fuentes has a current medication list which includes the following prescription(s): alprazolam, aspirin, buspirone, cholecalciferol, citalopram, clobetasol ointment, denosumab, denosumab, fluticasone, hydralazine, hydrochlorothiazide, levocetirizine, levothyroxine, losartan, montelukast, omega-3 fatty acids, pantoprazole, pregabalin, simvastatin, timolol, vyzulta, zolpidem, and tizanidine, and the following Facility-Administered Medications: cyanocobalamin. Her primarily concern today is  the Neck Pain (Right side )  Initial Vital Signs:  Pulse/HCG Rate: 87ECG Heart Rate: 72 Temp: (!) 97 F (36.1 C) Resp: 16 BP: (!) 198/85 SpO2: 98 %  BMI: Estimated body mass index is 29.95 kg/m as calculated from the following:   Height as of this encounter: 5\' 5"  (1.651 m).   Weight as of this encounter: 180  lb (81.6 kg).  Risk Assessment: Allergies: Reviewed. She is allergic to brimonidine tartrate, latanoprost, meloxicam, and raloxifene.  Allergy Precautions: None required Coagulopathies: Reviewed. None identified.  Blood-thinner therapy: None at this time Active Infection(s): Reviewed. None identified. Dawn Fuentes is afebrile  Site Confirmation: Dawn Fuentes was asked to confirm the procedure and laterality before marking the site Procedure checklist: Completed Consent: Before the procedure and under the influence of no sedative(s), amnesic(s), or anxiolytics, the patient was informed of the treatment options, risks and possible complications. To fulfill our ethical and legal obligations, as recommended by the American Medical Association's Code of Ethics, I have informed the patient of my clinical impression; the nature and purpose of the treatment or procedure; the risks, benefits, and possible complications of the intervention; the alternatives, including doing nothing; the risk(s) and benefit(s) of the alternative treatment(s) or procedure(s); and the risk(s) and benefit(s) of doing nothing. The patient was provided information about the general risks and possible complications associated with the procedure. These may include, but are not limited to: failure to achieve desired goals, infection, bleeding, organ or nerve damage, allergic reactions, paralysis, and death. In addition, the patient was informed of those risks and complications associated to Spine-related procedures, such as failure to decrease pain; infection (i.e.: Meningitis, epidural or intraspinal abscess); bleeding  (i.e.: epidural hematoma, subarachnoid hemorrhage, or any other type of intraspinal or peri-dural bleeding); organ or nerve damage (i.e.: Any type of peripheral nerve, nerve root, or spinal cord injury) with subsequent damage to sensory, motor, and/or autonomic systems, resulting in permanent pain, numbness, and/or weakness of one or several areas of the body; allergic reactions; (i.e.: anaphylactic reaction); and/or death. Furthermore, the patient was informed of those risks and complications associated with the medications. These include, but are not limited to: allergic reactions (i.e.: anaphylactic or anaphylactoid reaction(s)); adrenal axis suppression; blood sugar elevation that in diabetics may result in ketoacidosis or comma; water retention that in patients with history of congestive heart failure may result in shortness of breath, pulmonary edema, and decompensation with resultant heart failure; weight gain; swelling or edema; medication-induced neural toxicity; particulate matter embolism and blood vessel occlusion with resultant organ, and/or nervous system infarction; and/or aseptic necrosis of one or more joints. Finally, the patient was informed that Medicine is not an exact science; therefore, there is also the possibility of unforeseen or unpredictable risks and/or possible complications that may result in a catastrophic outcome. The patient indicated having understood very clearly. We have given the patient no guarantees and we have made no promises. Enough time was given to the patient to ask questions, all of which were answered to the patient's satisfaction. Ms. Chokshi has indicated that she wanted to continue with the procedure. Attestation: I, the ordering provider, attest that I have discussed with the patient the benefits, risks, side-effects, alternatives, likelihood of achieving goals, and potential problems during recovery for the procedure that I have provided informed consent. Date   Time: 12/21/2020 10:04 AM  Pre-Procedure Preparation:  Monitoring: As per clinic protocol. Respiration, ETCO2, SpO2, BP, heart rate and rhythm monitor placed and checked for adequate function Safety Precautions: Patient was assessed for positional comfort and pressure points before starting the procedure. Time-out: I initiated and conducted the "Time-out" before starting the procedure, as per protocol. The patient was asked to participate by confirming the accuracy of the "Time Out" information. Verification of the correct person, site, and procedure were performed and confirmed by me, the nursing staff, and the patient. "  Time-out" conducted as per Joint Commission's Universal Protocol (UP.01.01.01). Time: 1035  Description of Procedure:          Target Area: For Epidural Steroid injections the target is the interlaminar space, initially targeting the lower border of the superior vertebral body lamina. Approach: Paramedial approach. Area Prepped: Entire PosteriorCervical Region DuraPrep (Iodine Povacrylex [0.7% available iodine] and Isopropyl Alcohol, 74% w/w) Safety Precautions: Aspiration looking for blood return was conducted prior to all injections. At no point did we inject any substances, as a needle was being advanced. No attempts were made at seeking any paresthesias. Safe injection practices and needle disposal techniques used. Medications properly checked for expiration dates. SDV (single dose vial) medications used. Description of the Procedure: Protocol guidelines were followed. The procedure needle was introduced through the skin, ipsilateral to the reported pain, and advanced to the target area. Bone was contacted and the needle walked caudad, until the lamina was cleared. The epidural space was identified using "loss-of-resistance technique" with 2-3 ml of PF-NaCl (0.9% NSS), in a 5cc LOR glass syringe. Vitals:   12/21/20 1007 12/21/20 1038 12/21/20 1041  BP: (!) 198/85 132/77 127/77   Pulse: 87    Resp: 16 14 20   Temp: (!) 97 F (36.1 C)    TempSrc: Temporal    SpO2: 98% 96% 97%  Weight: 180 lb (81.6 kg)    Height: 5\' 5"  (1.651 m)      Start Time: 1035 hrs. End Time: 1040 hrs. Materials:  Needle(s) Type: Epidural needle Gauge: 17G Length: 3.5-in Medication(s): Please see orders for medications and dosing details.  Imaging Guidance (Spinal):          Type of Imaging Technique: Fluoroscopy Guidance (Spinal) Indication(s): Assistance in needle guidance and placement for procedures requiring needle placement in or near specific anatomical locations not easily accessible without such assistance. Exposure Time: Please see nurses notes. Contrast: Before injecting any contrast, we confirmed that the patient did not have an allergy to iodine, shellfish, or radiological contrast. Once satisfactory needle placement was completed at the desired level, radiological contrast was injected. Contrast injected under live fluoroscopy. No contrast complications. See chart for type and volume of contrast used. Fluoroscopic Guidance: I was personally present during the use of fluoroscopy. "Tunnel Vision Technique" used to obtain the best possible view of the target area. Parallax error corrected before commencing the procedure. "Direction-depth-direction" technique used to introduce the needle under continuous pulsed fluoroscopy. Once target was reached, antero-posterior, oblique, and lateral fluoroscopic projection used confirm needle placement in all planes. Images permanently stored in EMR. Interpretation: I personally interpreted the imaging intraoperatively. Adequate needle placement confirmed in multiple planes. Appropriate spread of contrast into desired area was observed. No evidence of afferent or efferent intravascular uptake. No intrathecal or subarachnoid spread observed. Permanent images saved into the patient's record.  Antibiotic Prophylaxis:   Anti-infectives (From  admission, onward)   None     Indication(s): None identified  Post-operative Assessment:  Post-procedure Vital Signs:  Pulse/HCG Rate: 8775 Temp: (!) 97 F (36.1 C) Resp: 20 BP: 127/77 SpO2: 97 %  EBL: None  Complications: No immediate post-treatment complications observed by team, or reported by patient.  Note: The patient tolerated the entire procedure well. A repeat set of vitals were taken after the procedure and the patient was kept under observation following institutional policy, for this type of procedure. Post-procedural neurological assessment was performed, showing return to baseline, prior to discharge. The patient was provided with post-procedure discharge instructions, including a  section on how to identify potential problems. Should any problems arise concerning this procedure, the patient was given instructions to immediately contact us, at any time, without hesitation. In any case, we plan to contact the patient by telephone for a follow-up status report regarding this interventional procedure.  Comments:  No additional relevant information.  Plan of Care  Orders:  Orders Placed This Encounter  Procedures  . DG PA[facility] C-ARM 1-60 MIN NO REPORT    Intraoperative interpretation by procedural physician at [place].    Standing Status:   Standing    Number of Occurrences:   1    Order Specific Question:   Reason for exam:    Answer:   Assistance in needle guidance and placement for procedures requiring needle placement in or near specific anatomical locations not easily accessible without such assistance.    Medications ordered for procedure: Meds ordered this encounter  Medications  . iohexol (OMNIPAQUE) 180 MG/ML injection 10 mL    Must be Myelogram-compatible. If not available, you may substitute with a water-soluble, non-ionic, hypoallergenic, myelogram-compatible radiological contrast medium.  Marland Kitchen lidocaine (XYLOCAINE) 2 % (with pres)  injection 400 mg  . ropivacaine (PF) 2 mg/mL (0.2%) (NAROPIN) injection 1 mL  . sodium chloride flush (NS) 0.9 % injection 1 mL  . dexamethasone (DECADRON) injection 10 mg  . diazepam (VALIUM) tablet 5 mg   Medications administered: We administered iohexol, lidocaine, ropivacaine (PF) 2 mg/mL (0.2%), sodium chloride flush, dexamethasone, and diazepam.  See the medical record for exact dosing, route, and time of administration.  Follow-up plan:   Return in about 4 weeks (around 01/18/2021) for Post Procedure Evaluation, virtual.    Recent Visits Date Type Provider Dept  12/01/20 Office Visit Gillis Santa, MD [facility]  Showing recent visits within past 90 days and meeting all other requirements Today's Visits Date Type Provider Dept  12/21/20 Procedure visit Gillis Santa, MD [facility]  Showing today's visits and meeting all other requirements Future Appointments Date Type Provider Dept  01/18/21 Appointment Gillis Santa, MD [facility]  Showing future appointments within next 90 days and meeting all other requirements  Disposition: Discharge home  Discharge (Date  Time): 12/21/2020; 1050 hrs.   Primary Care Physician: Steele Sizer, MD Location: [facility] Note by: Gillis Santa, MD Date: 12/21/2020; Time: 11:11 AM  Disclaimer:  Medicine is not an exact science. The only guarantee in medicine is that nothing is guaranteed. It is important to note that the decision to proceed with this intervention was based on the information collected from the patient. The Data and conclusions were drawn from the patient's questionnaire, the interview, and the physical examination. Because the information was provided in large part by the patient, it cannot be guaranteed that it has not been purposely or unconsciously manipulated. Every effort has been made to obtain as much relevant data as possible for this evaluation. It is important  to note that the conclusions that lead to this procedure are derived in large part from the available data. Always take into account that the treatment will also be dependent on availability of resources and existing treatment guidelines, considered by other Pain Management Practitioners as being common knowledge and practice, at the time of the intervention. For Medico-Legal purposes, it is also important to point out that variation in procedural techniques and pharmacological choices are the acceptable norm. The indications, contraindications, technique, and results of the above procedure should only be  interpreted and judged by a Board-Certified Interventional Pain Specialist with extensive familiarity and expertise in the same exact procedure and technique.

## 2020-12-22 ENCOUNTER — Telehealth: Payer: Self-pay

## 2020-12-22 NOTE — Telephone Encounter (Signed)
Post procedure follow up.  Patient states she is doing good.  

## 2020-12-25 DIAGNOSIS — M5412 Radiculopathy, cervical region: Secondary | ICD-10-CM | POA: Diagnosis not present

## 2020-12-28 DIAGNOSIS — M5412 Radiculopathy, cervical region: Secondary | ICD-10-CM | POA: Diagnosis not present

## 2020-12-31 ENCOUNTER — Encounter: Payer: Self-pay | Admitting: Family Medicine

## 2020-12-31 ENCOUNTER — Other Ambulatory Visit: Payer: Self-pay

## 2020-12-31 DIAGNOSIS — M5412 Radiculopathy, cervical region: Secondary | ICD-10-CM | POA: Diagnosis not present

## 2020-12-31 DIAGNOSIS — F411 Generalized anxiety disorder: Secondary | ICD-10-CM

## 2020-12-31 DIAGNOSIS — I10 Essential (primary) hypertension: Secondary | ICD-10-CM | POA: Diagnosis not present

## 2020-12-31 DIAGNOSIS — Z6830 Body mass index (BMI) 30.0-30.9, adult: Secondary | ICD-10-CM | POA: Diagnosis not present

## 2020-12-31 MED ORDER — ALPRAZOLAM 0.5 MG PO TABS: 0.5000 mg | ORAL_TABLET | Freq: Every day | ORAL | 0 refills | Status: DC | PRN

## 2021-01-08 DIAGNOSIS — M5412 Radiculopathy, cervical region: Secondary | ICD-10-CM | POA: Diagnosis not present

## 2021-01-11 DIAGNOSIS — M5412 Radiculopathy, cervical region: Secondary | ICD-10-CM | POA: Diagnosis not present

## 2021-01-13 ENCOUNTER — Encounter: Payer: Self-pay | Admitting: Student in an Organized Health Care Education/Training Program

## 2021-01-13 DIAGNOSIS — M810 Age-related osteoporosis without current pathological fracture: Secondary | ICD-10-CM | POA: Diagnosis not present

## 2021-01-14 ENCOUNTER — Ambulatory Visit
Payer: PPO | Attending: Student in an Organized Health Care Education/Training Program | Admitting: Student in an Organized Health Care Education/Training Program

## 2021-01-14 ENCOUNTER — Encounter: Payer: Self-pay | Admitting: Student in an Organized Health Care Education/Training Program

## 2021-01-14 ENCOUNTER — Other Ambulatory Visit: Payer: Self-pay

## 2021-01-14 DIAGNOSIS — M5412 Radiculopathy, cervical region: Secondary | ICD-10-CM

## 2021-01-14 DIAGNOSIS — G894 Chronic pain syndrome: Secondary | ICD-10-CM

## 2021-01-14 DIAGNOSIS — M47812 Spondylosis without myelopathy or radiculopathy, cervical region: Secondary | ICD-10-CM | POA: Insufficient documentation

## 2021-01-14 DIAGNOSIS — G43809 Other migraine, not intractable, without status migrainosus: Secondary | ICD-10-CM | POA: Diagnosis not present

## 2021-01-14 DIAGNOSIS — M5030 Other cervical disc degeneration, unspecified cervical region: Secondary | ICD-10-CM

## 2021-01-14 MED ORDER — PREGABALIN 50 MG PO CAPS: ORAL_CAPSULE | ORAL | 2 refills | Status: DC

## 2021-01-14 MED ORDER — METHOCARBAMOL 500 MG PO TABS: 500.0000 mg | ORAL_TABLET | Freq: Two times a day (BID) | ORAL | 2 refills | Status: DC | PRN

## 2021-01-14 NOTE — Progress Notes (Signed)
Patient: Dawn Fuentes  Service Category: E/M  Provider: Gillis Santa, MD  DOB: Apr 28, 1946  DOS: 01/14/2021  Location: Office  MRN: [id number]  Setting: Ambulatory outpatient  Referring Provider: Steele Sizer, MD  Type: Established Patient  Specialty: Interventional Pain Management  PCP: Steele Sizer, MD  Location: Home  Delivery: TeleHealth     Virtual Encounter - Pain Management PROVIDER NOTE: Information contained herein reflects review and annotations entered in association with encounter. Interpretation of such information and data should be left to medically-trained personnel. Information provided to patient can be located elsewhere in the medical record under "Patient Instructions". Document created using STT-dictation technology, any transcriptional errors that may result from process are unintentional.    Contact & Pharmacy Preferred: [phone] Home: [phone] (home) Mobile: [phone] (mobile) E-mail: [email or web address]  [facility] [address] [place] [address] Phone: [phone] Fax: [phone]   Pre-screening  [name] offered "in-person" vs "virtual" encounter. She indicated preferring virtual for this encounter.   Reason COVID-19*  Social distancing based on CDC and AMA recommendations.   I contacted [name] on 01/14/2021 via video conference.      I clearly identified myself as Gillis Santa, MD. I verified that I was speaking with the correct person using two identifiers (Name: Dawn Fuentes, and date of birth: 1946/12/05).  Consent I sought verbal advanced consent from [name] for virtual visit interactions. I informed Ms. Novakowski of possible security and privacy concerns, risks, and limitations associated with providing "not-in-person" medical evaluation and management services. I also informed [name] of the availability of "in-person"  appointments. Finally, I informed her that there would be a charge for the virtual visit and that she could be  personally, fully or partially, financially responsible for it. Ms. Fede expressed understanding and agreed to proceed.   Historic Elements   [name] is a 75 y.o. year old, female patient evaluated today after our last contact on 12/21/2020. [name]  has a past medical history of Allergy, Anxiety, Arthritis, Chronic insomnia, Colon adenoma, Dyslipidemia, Dysphagia, Female stress incontinence, GERD (gastroesophageal reflux disease), Glaucoma, both eyes, Hyperlipidemia, Hypertension, Hypothyroidism, Menopause syndrome, Osteoporosis, Polyp of colon, Renal insufficiency, and Vitamin D deficiency. She also  has a past surgical history that includes Ovarian cyst removal; Colonoscopy with propofol (N/A, 05/29/2015); Esophagogastroduodenoscopy (N/A, 05/29/2015); Appendectomy; Abdominal hysterectomy; Eye surgery; Photocoagulation with laser (Right, 11/21/2016); Esophagogastroduodenoscopy (N/A, 09/14/2020); and Colonoscopy (N/A, 09/14/2020). Ms. Diego has a current medication list which includes the following prescription(s): alprazolam, aspirin, buspirone, cholecalciferol, citalopram, clobetasol ointment, denosumab, denosumab, fluticasone, hydralazine, hydrochlorothiazide, levocetirizine, levothyroxine, losartan, methocarbamol, montelukast, omega-3 fatty acids, pantoprazole, simvastatin, timolol, vyzulta, zolpidem, and pregabalin, and the following Facility-Administered Medications: cyanocobalamin. She  reports that she has never smoked. She has never used smokeless tobacco. She reports that she does not drink alcohol and does not use drugs. [name] is allergic to brimonidine tartrate, latanoprost, meloxicam, and raloxifene.   HPI  Today, she is being contacted for a post-procedure assessment.   Post-Procedure Evaluation  Procedure (12/21/2020): Right C7-T1 ESI  Sedation: Please see  nurses note.  Effectiveness during initial hour after procedure(Ultra-Short Term Relief): 100 %   Local anesthetic used: Long-acting (4-6 hours) Effectiveness: Defined as any analgesic benefit obtained secondary to the administration of local anesthetics. This carries significant diagnostic value as to the etiological location, or anatomical origin, of the  pain. Duration of benefit is expected to coincide with the duration of the local anesthetic used.  Effectiveness during initial 4-6 hours after procedure(Short-Term Relief): 100 %   Long-term benefit: Defined as any relief past the pharmacologic duration of the local anesthetics.  Effectiveness past the initial 6 hours after procedure(Long-Term Relief): 0 %   Current benefits: Defined as benefit that persist at this time.   Analgesia:  Back to baseline Function: Back to baseline ROM: Back to baseline   Laboratory Chemistry Profile   Renal Lab Results  Component Value Date   BUN 15 10/19/2020   CREATININE 1.13 (H) 10/19/2020   LABCREA 15 (L) 10/19/2020   BCR 13 10/19/2020   GFRAA 56 (L) 10/19/2020   GFRNONAA 49 (L) 10/19/2020     Hepatic Lab Results  Component Value Date   AST 53 (H) 10/19/2020   ALT 54 (H) 10/19/2020   ALBUMIN 4.2 03/08/2017   ALKPHOS 50 03/08/2017     Electrolytes Lab Results  Component Value Date   NA 138 10/19/2020   K 4.5 10/19/2020   CL 104 10/19/2020   CALCIUM 9.9 10/19/2020     Bone Lab Results  Component Value Date   VD25OH 35 03/18/2020     Inflammation (CRP: Acute Phase) (ESR: Chronic Phase) No results found for: CRP, ESRSEDRATE, LATICACIDVEN     Note: Above Lab results reviewed.  CLINICAL DATA:  Cervical radiculopathy. Right-sided neck pain for 6 months traveling into the right temple/here region with headaches. Arm pain.  EXAM: MRI CERVICAL SPINE WITHOUT CONTRAST  TECHNIQUE: Multiplanar, multisequence MR imaging of the cervical spine was performed. No intravenous  contrast was administered.  COMPARISON:  Cervical spine radiographs 06/24/2020  FINDINGS: Alignment: Cervical spine straightening.  No significant listhesis.  Vertebrae: No fracture, suspicious osseous lesion, or significant marrow edema. Mild chronic degenerative endplate changes at J9-4 and C6-7 associated with moderate disc space narrowing, greater at C6-7.  Cord: Normal signal and morphology.  Posterior Fossa, vertebral arteries, paraspinal tissues: Unremarkable.  Disc levels:  C2-3: Negative.  C3-4: Disc bulging eccentric to the right and mild facet arthrosis result in mild right neural foraminal stenosis without spinal stenosis.  C4-5: Mild disc bulging results in borderline to mild right neural foraminal stenosis without spinal stenosis.  C5-6: Disc bulging greater to the right and mild uncovertebral spurring result in moderate right neural foraminal stenosis with potential right C6 nerve root impingement. No spinal stenosis.  C6-7: Broad-based posterior disc osteophyte complex without significant stenosis.  C7-T1: Mild disc bulging without stenosis.  IMPRESSION: 1. Cervical disc degeneration most notable at C5-6 where there is moderate right neural foraminal stenosis. 2. No spinal stenosis.   Electronically Signed   By: Logan Bores M.D.   On: 11/05/2020 10:25   Assessment  The primary encounter diagnosis was Degeneration of cervical intervertebral disc. Diagnoses of Cervical facet joint syndrome, Cervicogenic migraine, Chronic pain syndrome, and Cervical radiculitis were also pertinent to this visit.  Plan of Care  Ms. [name] has a current medication list which includes the following long-term medication(s): citalopram, fluticasone, hydralazine, hydrochlorothiazide, levocetirizine, levothyroxine, losartan, montelukast, pantoprazole, simvastatin, zolpidem, and pregabalin.  AMEL KITCH has a history of greater than 3 months of  moderate to severe pain which is resulted in functional impairment.  The patient has tried various conservative therapeutic options such as NSAIDs, Tylenol, muscle relaxants, physical therapy (currently in) which was inadequately effective.  Patient's pain is predominantly axial with physical exam and MRI findings suggestive  of facet arthropathy.  Cervical facet medial branch nerve blocks were discussed with the patient.  Risks and benefits were reviewed.  Patient would like to proceed with bilateral C4, C5, C6, medial branch nerve block.  I will also refill her Lyrica as below.  I have instructed her to discontinue her baclofen and trial of Robaxin as below.  Continue to work with physical therapy, dry needling.  Continue with heat application to the neck during onset of severe neck pain.  Pharmacotherapy (Medications Ordered): Meds ordered this encounter  Medications  . methocarbamol (ROBAXIN) 500 MG tablet    Sig: Take 1 tablet (500 mg total) by mouth 2 (two) times daily as needed for muscle spasms.    Dispense:  60 tablet    Refill:  2    Do not place this medication, or any other prescription from our practice, on "Automatic Refill". Patient may have prescription filled one day early if pharmacy is closed on scheduled refill date.  . pregabalin (LYRICA) 50 MG capsule    Sig: 50 mg during the day, 100 mg at night    Dispense:  90 capsule    Refill:  2   Orders:  Orders Placed This Encounter  Procedures  . CERVICAL FACET (MEDIAL BRANCH NERVE BLOCK)     Standing Status:   Future    Standing Expiration Date:   02/14/2021    Scheduling Instructions:     Side: Bilateral     Level: C3-4, C4-5, C5-6 Facet joints (C4, C5, C6, Medial Branch Nerves)     Sedation: PO Valium     Timeframe: As soon as schedule allows    Order Specific Question:   Where will this procedure be performed?    Answer:   [facility]   Follow-up plan:   Return in about 13 days (around 01/27/2021) for B/L  C4,5,6 Fct Blk with PO Valium.     C7-T1 ESI, not effective, plan for diagnostic cervical facet medial branch nerve blocks, consider botox   Recent Visits Date Type Provider Dept  12/21/20 Procedure visit Gillis Santa, MD [facility]  12/01/20 Office Visit Gillis Santa, MD [facility]  Showing recent visits within past 90 days and meeting all other requirements Today's Visits Date Type Provider Dept  01/14/21 Telemedicine Gillis Santa, MD [facility]  Showing today's visits and meeting all other requirements Future Appointments No visits were found meeting these conditions. Showing future appointments within next 90 days and meeting all other requirements  I discussed the assessment and treatment plan with the patient. The patient was provided an opportunity to ask questions and all were answered. The patient agreed with the plan and demonstrated an understanding of the instructions.  Patient advised to call back or seek an in-person evaluation if the symptoms or condition worsens.  Duration of encounter:68mnutes.  Note by: BGillis Santa MD Date: 01/14/2021; Time: 3:32 PM

## 2021-01-15 DIAGNOSIS — M5412 Radiculopathy, cervical region: Secondary | ICD-10-CM | POA: Diagnosis not present

## 2021-01-18 ENCOUNTER — Telehealth: Payer: PPO | Admitting: Student in an Organized Health Care Education/Training Program

## 2021-01-18 DIAGNOSIS — M5412 Radiculopathy, cervical region: Secondary | ICD-10-CM | POA: Diagnosis not present

## 2021-01-25 ENCOUNTER — Ambulatory Visit
Admission: RE | Admit: 2021-01-25 | Discharge: 2021-01-25 | Disposition: A | Discharge status: Home / Self Care | Payer: PPO | Source: Ambulatory Visit | Attending: Student in an Organized Health Care Education/Training Program | Admitting: Student in an Organized Health Care Education/Training Program

## 2021-01-25 ENCOUNTER — Encounter: Payer: Self-pay | Admitting: Student in an Organized Health Care Education/Training Program

## 2021-01-25 ENCOUNTER — Ambulatory Visit (HOSPITAL_BASED_OUTPATIENT_CLINIC_OR_DEPARTMENT_OTHER): Payer: PPO | Admitting: Student in an Organized Health Care Education/Training Program

## 2021-01-25 ENCOUNTER — Other Ambulatory Visit: Payer: Self-pay

## 2021-01-25 VITALS — BP 132/80 | HR 76 | Temp 97.0°F | Resp 16 | Ht 65.0 in | Wt 180.0 lb

## 2021-01-25 DIAGNOSIS — G894 Chronic pain syndrome: Secondary | ICD-10-CM | POA: Insufficient documentation

## 2021-01-25 DIAGNOSIS — G43809 Other migraine, not intractable, without status migrainosus: Secondary | ICD-10-CM | POA: Diagnosis present

## 2021-01-25 DIAGNOSIS — M47812 Spondylosis without myelopathy or radiculopathy, cervical region: Secondary | ICD-10-CM | POA: Insufficient documentation

## 2021-01-25 MED ORDER — DIAZEPAM 5 MG PO TABS
5.0000 mg | ORAL_TABLET | Freq: Once | ORAL | Status: AC
  Administered 2021-01-25: 5 mg via ORAL

## 2021-01-25 MED ORDER — ROPIVACAINE HCL 2 MG/ML IJ SOLN
9.0000 mL | Freq: Once | INTRAMUSCULAR | Status: AC
  Administered 2021-01-25: 9 mL via PERINEURAL

## 2021-01-25 MED ORDER — LIDOCAINE HCL (PF) 2 % IJ SOLN
INTRAMUSCULAR | Status: AC
  Filled 2021-01-25: qty 10

## 2021-01-25 MED ORDER — LIDOCAINE HCL 2 % IJ SOLN
20.0000 mL | Freq: Once | INTRAMUSCULAR | Status: AC
  Administered 2021-01-25: 200 mg
  Filled 2021-01-25: qty 20

## 2021-01-25 MED ORDER — ROPIVACAINE HCL 2 MG/ML IJ SOLN
9.0000 mL | Freq: Once | INTRAMUSCULAR | Status: DC
  Filled 2021-01-25: qty 10

## 2021-01-25 MED ORDER — DEXAMETHASONE SODIUM PHOSPHATE 10 MG/ML IJ SOLN
10.0000 mg | Freq: Once | INTRAMUSCULAR | Status: AC
  Administered 2021-01-25: 10 mg
  Filled 2021-01-25: qty 1

## 2021-01-25 MED ORDER — DIAZEPAM 5 MG PO TABS
ORAL_TABLET | ORAL | Status: AC
  Filled 2021-01-25: qty 1

## 2021-01-25 NOTE — Progress Notes (Signed)
Safety precautions to be maintained throughout the outpatient stay will include: orient to surroundings, keep bed in low position, maintain call bell within reach at all times, provide assistance with transfer out of bed and ambulation.  

## 2021-01-25 NOTE — Progress Notes (Signed)
PROVIDER NOTE: Information contained herein reflects review and annotations entered in association with encounter. Interpretation of such information and data should be left to medically-trained personnel. Information provided to patient can be located elsewhere in the medical record under "Patient Instructions". Document created using STT-dictation technology, any transcriptional errors that may result from process are unintentional.    Patient: Dawn Fuentes  Service Category: Procedure  Provider: Gillis Santa, MD  DOB: 1946-04-15  DOS: 01/25/2021  Location: [facility]  MRN: [id number]  Setting: Ambulatory - outpatient  Referring Provider: Steele Sizer, MD  Type: Established Patient  Specialty: Interventional Pain Management  PCP: Steele Sizer, MD   Primary Reason for Visit: Interventional Pain Management Treatment. CC: Headache (Right, temple and ear area)  Procedure:          Anesthesia, Analgesia, Anxiolysis:  Type: Cervical Facet Medial Branch Block(s)  #1  Primary Purpose: Diagnostic Region: Posterolateral cervical spine Level: C4, C5, C6, Medial Branch Level(s). Injecting these levels blocks the C4-5, C5-6, cervical facet joints. Laterality: Right  Type: Local Anesthesia + PO Valium  Local Anesthetic: Lidocaine 1-2%  Position: Prone with head of the table raised to facilitate breathing.   Indications: 1. Cervical facet joint syndrome   2. Cervicogenic migraine   3. Chronic pain syndrome    Pain Score: Pre-procedure: 4 /10 Post-procedure: 1 /10   Pre-op H&P Assessment:  Dawn Fuentes is a 75 y.o. (year old), female patient, seen today for interventional treatment. She  has a past surgical history that includes Ovarian cyst removal; Colonoscopy with propofol (N/A, 05/29/2015); Esophagogastroduodenoscopy (N/A, 05/29/2015); Appendectomy; Abdominal hysterectomy; Eye surgery; Photocoagulation with laser (Right, 11/21/2016); Esophagogastroduodenoscopy (N/A,  09/14/2020); and Colonoscopy (N/A, 09/14/2020). Dawn Fuentes has a current medication list which includes the following prescription(s): alprazolam, aspirin, buspirone, cholecalciferol, citalopram, clobetasol ointment, denosumab, fluticasone, hydralazine, hydrochlorothiazide, levocetirizine, levothyroxine, losartan, methocarbamol, montelukast, omega-3 fatty acids, pantoprazole, simvastatin, timolol, vyzulta, zolpidem, denosumab, and pregabalin, and the following Facility-Administered Medications: cyanocobalamin and ropivacaine (pf) 2 mg/ml (0.2%). Her primarily concern today is the Headache (Right, temple and ear area)  Initial Vital Signs:  Pulse/HCG Rate: 81  Temp: (!) 97 F (36.1 C) Resp: 16 BP: (!) 165/83 SpO2: 100 %  BMI: Estimated body mass index is 29.95 kg/m as calculated from the following:   Height as of this encounter: 5\' 5"  (1.651 m).   Weight as of this encounter: 180 lb (81.6 kg).  Risk Assessment: Allergies: Reviewed. She is allergic to brimonidine tartrate, latanoprost, meloxicam, and raloxifene.  Allergy Precautions: None required Coagulopathies: Reviewed. None identified.  Blood-thinner therapy: None at this time Active Infection(s): Reviewed. None identified. Dawn Fuentes is afebrile  Site Confirmation: Dawn Fuentes was asked to confirm the procedure and laterality before marking the site Procedure checklist: Completed Consent: Before the procedure and under the influence of no sedative(s), amnesic(s), or anxiolytics, the patient was informed of the treatment options, risks and possible complications. To fulfill our ethical and legal obligations, as recommended by the American Medical Association's Code of Ethics, I have informed the patient of my clinical impression; the nature and purpose of the treatment or procedure; the risks, benefits, and possible complications of the intervention; the alternatives, including doing nothing; the risk(s) and benefit(s) of the alternative  treatment(s) or procedure(s); and the risk(s) and benefit(s) of doing nothing. The patient was provided information about the general risks and possible complications associated with the procedure. These may include, but are not limited to: failure to achieve desired goals, infection, bleeding, organ  or nerve damage, allergic reactions, paralysis, and death. In addition, the patient was informed of those risks and complications associated to Spine-related procedures, such as failure to decrease pain; infection (i.e.: Meningitis, epidural or intraspinal abscess); bleeding (i.e.: epidural hematoma, subarachnoid hemorrhage, or any other type of intraspinal or peri-dural bleeding); organ or nerve damage (i.e.: Any type of peripheral nerve, nerve root, or spinal cord injury) with subsequent damage to sensory, motor, and/or autonomic systems, resulting in permanent pain, numbness, and/or weakness of one or several areas of the body; allergic reactions; (i.e.: anaphylactic reaction); and/or death. Furthermore, the patient was informed of those risks and complications associated with the medications. These include, but are not limited to: allergic reactions (i.e.: anaphylactic or anaphylactoid reaction(s)); adrenal axis suppression; blood sugar elevation that in diabetics may result in ketoacidosis or comma; water retention that in patients with history of congestive heart failure may result in shortness of breath, pulmonary edema, and decompensation with resultant heart failure; weight gain; swelling or edema; medication-induced neural toxicity; particulate matter embolism and blood vessel occlusion with resultant organ, and/or nervous system infarction; and/or aseptic necrosis of one or more joints. Finally, the patient was informed that Medicine is not an exact science; therefore, there is also the possibility of unforeseen or unpredictable risks and/or possible complications that may result in a catastrophic  outcome. The patient indicated having understood very clearly. We have given the patient no guarantees and we have made no promises. Enough time was given to the patient to ask questions, all of which were answered to the patient's satisfaction. Ms. Islam has indicated that she wanted to continue with the procedure. Attestation: I, the ordering provider, attest that I have discussed with the patient the benefits, risks, side-effects, alternatives, likelihood of achieving goals, and potential problems during recovery for the procedure that I have provided informed consent. Date  Time: 01/25/2021 11:42 AM  Pre-Procedure Preparation:  Monitoring: As per clinic protocol. Respiration, ETCO2, SpO2, BP, heart rate and rhythm monitor placed and checked for adequate function Safety Precautions: Patient was assessed for positional comfort and pressure points before starting the procedure. Time-out: I initiated and conducted the "Time-out" before starting the procedure, as per protocol. The patient was asked to participate by confirming the accuracy of the "Time Out" information. Verification of the correct person, site, and procedure were performed and confirmed by me, the nursing staff, and the patient. "Time-out" conducted as per Joint Commission's Universal Protocol (UP.01.01.01). Time: 1213  Description of Procedure:          Laterality: Right Level: C4, C5, C6,  Medial Branch Level(s). Area Prepped: Posterior Cervico-thoracic Region DuraPrep (Iodine Povacrylex [0.7% available iodine] and Isopropyl Alcohol, 74% w/w) Safety Precautions: Aspiration looking for blood return was conducted prior to all injections. At no point did we inject any substances, as a needle was being advanced. Before injecting, the patient was told to immediately notify me if she was experiencing any new onset of "ringing in the ears, or metallic taste in the mouth". No attempts were made at seeking any paresthesias. Safe injection  practices and needle disposal techniques used. Medications properly checked for expiration dates. SDV (single dose vial) medications used. After the completion of the procedure, all disposable equipment used was discarded in the proper designated medical waste containers. Local Anesthesia: Protocol guidelines were followed. The patient was positioned over the fluoroscopy table. The area was prepped in the usual manner. The time-out was completed. The target area was identified using fluoroscopy. A 12-in long,  straight, sterile hemostat was used with fluoroscopic guidance to locate the targets for each level blocked. Once located, the skin was marked with an approved surgical skin marker. Once all sites were marked, the skin (epidermis, dermis, and hypodermis), as well as deeper tissues (fat, connective tissue and muscle) were infiltrated with a small amount of a short-acting local anesthetic, loaded on a 10cc syringe with a 25G, 1.5-in  Needle. An appropriate amount of time was allowed for local anesthetics to take effect before proceeding to the next step. Local Anesthetic: Lidocaine 2.0% The unused portion of the local anesthetic was discarded in the proper designated containers. Technical explanation of process:   C4 Medial Branch Nerve Block (MBB): The target area for the C4 dorsal medial articular branch is the lateral concave waist of the articular pillar of C4. Under fluoroscopic guidance, a Quincke needle was inserted until contact was made with os over the postero-lateral aspect of the articular pillar of C4 (target area). After negative aspiration for blood, 2 mL of the nerve block solution was injected without difficulty or complication. The needle was removed intact. C5 Medial Branch Nerve Block (MBB): The target area for the C5 dorsal medial articular branch is the lateral concave waist of the articular pillar of C5. Under fluoroscopic guidance, a Quincke needle was inserted until contact was made  with os over the postero-lateral aspect of the articular pillar of C5 (target area). After negative aspiration for blood, 2 mL of the nerve block solution was injected without difficulty or complication. The needle was removed intact. C6 Medial Branch Nerve Block (MBB): The target area for the C6 dorsal medial articular branch is the lateral concave waist of the articular pillar of C6. Under fluoroscopic guidance, a Quincke needle was inserted until contact was made with os over the postero-lateral aspect of the articular pillar of C6 (target area). After negative aspiration for blood, 62mL of the nerve block solution was injected without difficulty or complication. The needle was removed intact.  Procedural Needles: 25-gauge, 3.5-inch, Quincke needles used for all levels. Nerve block solution: 6 cc solution made of 4 cc of 0.2% ropivacaine, 2 cc of Decadron, 10 mg/cc.  2 cc injected at each level above on the right.  Once the entire procedure was completed, the treated area was cleaned, making sure to leave some of the prepping solution back to take advantage of its long term bactericidal properties.  Anatomy Reference Guide:       Vitals:   01/25/21 1146 01/25/21 1210 01/25/21 1220 01/25/21 1230  BP: (!) 165/83 (!) 157/92 135/81 132/80  Pulse: 81 77 75 76  Resp: 16 20 16 16   Temp: (!) 97 F (36.1 C)     TempSrc: Temporal     SpO2: 100% 96% 95% 96%  Weight: 180 lb (81.6 kg)     Height: 5\' 5"  (1.651 m)       Start Time: 1213 hrs. End Time: 1224 hrs.  Imaging Guidance (Spinal):          Type of Imaging Technique: Fluoroscopy Guidance (Spinal) Indication(s): Assistance in needle guidance and placement for procedures requiring needle placement in or near specific anatomical locations not easily accessible without such assistance. Exposure Time: Please see nurses notes. Contrast: None used. Fluoroscopic Guidance: I was personally present during the use of fluoroscopy. "Tunnel Vision  Technique" used to obtain the best possible view of the target area. Parallax error corrected before commencing the procedure. "Direction-depth-direction" technique used to introduce the needle under  continuous pulsed fluoroscopy. Once target was reached, antero-posterior, oblique, and lateral fluoroscopic projection used confirm needle placement in all planes. Images permanently stored in EMR. Interpretation: No contrast injected. I personally interpreted the imaging intraoperatively. Adequate needle placement confirmed in multiple planes. Permanent images saved into the patient's record.   Post-operative Assessment:  Post-procedure Vital Signs:  Pulse/HCG Rate: 76  Temp: (!) 97 F (36.1 C) Resp: 16 BP: 132/80 SpO2: 96 %  EBL: None  Complications: No immediate post-treatment complications observed by team, or reported by patient.  Note: The patient tolerated the entire procedure well. A repeat set of vitals were taken after the procedure and the patient was kept under observation following institutional policy, for this type of procedure. Post-procedural neurological assessment was performed, showing return to baseline, prior to discharge. The patient was provided with post-procedure discharge instructions, including a section on how to identify potential problems. Should any problems arise concerning this procedure, the patient was given instructions to immediately contact us, at any time, without hesitation. In any case, we plan to contact the patient by telephone for a follow-up status report regarding this interventional procedure.  Comments:  No additional relevant information.  Plan of Care  Orders:  Orders Placed This Encounter  Procedures  . [facility] C-ARM 1-60 MIN NO REPORT    Intraoperative interpretation by procedural physician at [place].    Standing Status:   Standing    Number of Occurrences:   1    Order Specific Question:   Reason for exam:     Answer:   Assistance in needle guidance and placement for procedures requiring needle placement in or near specific anatomical locations not easily accessible without such assistance.   Medications ordered for procedure: Meds ordered this encounter  Medications  . lidocaine (XYLOCAINE) 2 % (with pres) injection 400 mg  . ropivacaine (PF) 2 mg/mL (0.2%) (NAROPIN) injection 9 mL  . ropivacaine (PF) 2 mg/mL (0.2%) (NAROPIN) injection 9 mL  . dexamethasone (DECADRON) injection 10 mg  . dexamethasone (DECADRON) injection 10 mg  . diazepam (VALIUM) tablet 5 mg   Medications administered: We administered lidocaine, ropivacaine (PF) 2 mg/mL (0.2%), dexamethasone, dexamethasone, and diazepam.  See the medical record for exact dosing, route, and time of administration.  Follow-up plan:   Return in about 3 weeks (around 02/15/2021) for Post Procedure Evaluation, virtual.      C7-T1 ESI, not effective, right C4, C5, C6 cervical facet medial branch nerve block #1 01/25/2021,  consider botox    Recent Visits Date Type Provider Dept  01/14/21 Telemedicine Gillis Santa, MD [facility]  12/21/20 Procedure visit Gillis Santa, MD [facility]  12/01/20 Office Visit Gillis Santa, MD [facility]  Showing recent visits within past 90 days and meeting all other requirements Today's Visits Date Type Provider Dept  01/25/21 Procedure visit Gillis Santa, MD [facility]  Showing today's visits and meeting all other requirements Future Appointments Date Type Provider Dept  02/11/21 Appointment Gillis Santa, MD [facility]  Showing future appointments within next 90 days and meeting all other requirements  Disposition: Discharge home  Discharge (Date  Time): 01/25/2021; 1246 hrs.   Primary Care Physician: Steele Sizer, MD Location: [facility] Note by: Gillis Santa, MD Date: 01/25/2021; Time: 1:29 PM  Disclaimer:  Medicine  is not an exact science. The only guarantee in medicine is that nothing is guaranteed. It is important to note that the decision to  proceed with this intervention was based on the information collected from the patient. The Data and conclusions were drawn from the patient's questionnaire, the interview, and the physical examination. Because the information was provided in large part by the patient, it cannot be guaranteed that it has not been purposely or unconsciously manipulated. Every effort has been made to obtain as much relevant data as possible for this evaluation. It is important to note that the conclusions that lead to this procedure are derived in large part from the available data. Always take into account that the treatment will also be dependent on availability of resources and existing treatment guidelines, considered by other Pain Management Practitioners as being common knowledge and practice, at the time of the intervention. For Medico-Legal purposes, it is also important to point out that variation in procedural techniques and pharmacological choices are the acceptable norm. The indications, contraindications, technique, and results of the above procedure should only be interpreted and judged by a Board-Certified Interventional Pain Specialist with extensive familiarity and expertise in the same exact procedure and technique.

## 2021-01-25 NOTE — Patient Instructions (Signed)
____________________________________________________________________________________________  Post-Procedure Discharge Instructions  Instructions:  Apply ice:   Purpose: This will minimize any swelling and discomfort after procedure.   When: Day of procedure, as soon as you get home.  How: Fill a plastic sandwich bag with crushed ice. Cover it with a small towel and apply to injection site.  How long: (15 min on, 15 min off) Apply for 15 minutes then remove x 15 minutes.  Repeat sequence on day of procedure, until you go to bed.  Apply heat:   Purpose: To treat any soreness and discomfort from the procedure.  When: Starting the next day after the procedure.  How: Apply heat to procedure site starting the day following the procedure.  How long: May continue to repeat daily, until discomfort goes away.  Food intake: Start with clear liquids (like water) and advance to regular food, as tolerated.   Physical activities: Keep activities to a minimum for the first 8 hours after the procedure. After that, then as tolerated.  Driving: If you have received any sedation, be responsible and do not drive. You are not allowed to drive for 24 hours after having sedation.  Blood thinner: (Applies only to those taking blood thinners) You may restart your blood thinner 6 hours after your procedure.  Insulin: (Applies only to Diabetic patients taking insulin) As soon as you can eat, you may resume your normal dosing schedule.  Infection prevention: Keep procedure site clean and dry. Shower daily and clean area with soap and water.  Post-procedure Pain Diary: Extremely important that this be done correctly and accurately. Recorded information will be used to determine the next step in treatment. For the purpose of accuracy, follow these rules:  Evaluate only the area treated. Do not report or include pain from an untreated area. For the purpose of this evaluation, ignore all other areas of pain,  except for the treated area.  After your procedure, avoid taking a long nap and attempting to complete the pain diary after you wake up. Instead, set your alarm clock to go off every hour, on the hour, for the initial 8 hours after the procedure. Document the duration of the numbing medicine, and the relief you are getting from it.  Do not go to sleep and attempt to complete it later. It will not be accurate. If you received sedation, it is likely that you were given a medication that may cause amnesia. Because of this, completing the diary at a later time may cause the information to be inaccurate. This information is needed to plan your care.  Follow-up appointment: Keep your post-procedure follow-up evaluation appointment after the procedure (usually 2 weeks for most procedures, 6 weeks for radiofrequencies). DO NOT FORGET to bring you pain diary with you.   Expect: (What should I expect to see with my procedure?)  From numbing medicine (AKA: Local Anesthetics): Numbness or decrease in pain. You may also experience some weakness, which if present, could last for the duration of the local anesthetic.  Onset: Full effect within 15 minutes of injected.  Duration: It will depend on the type of local anesthetic used. On the average, 1 to 8 hours.   From steroids (Applies only if steroids were used): Decrease in swelling or inflammation. Once inflammation is improved, relief of the pain will follow.  Onset of benefits: Depends on the amount of swelling present. The more swelling, the longer it will take for the benefits to be seen. In some cases, up to 10 days.    Duration: Steroids will stay in the system x 2 weeks. Duration of benefits will depend on multiple posibilities including persistent irritating factors.  Side-effects: If present, they may typically last 2 weeks (the duration of the steroids).  Frequent: Cramps (if they occur, drink Gatorade and take over-the-counter Magnesium 450-500 mg  once to twice a day); water retention with temporary weight gain; increases in blood sugar; decreased immune system response; increased appetite.  Occasional: Facial flushing (red, warm cheeks); mood swings; menstrual changes.  Uncommon: Long-term decrease or suppression of natural hormones; bone thinning. (These are more common with higher doses or more frequent use. This is why we prefer that our patients avoid having any injection therapies in other practices.)   Very Rare: Severe mood changes; psychosis; aseptic necrosis.  From procedure: Some discomfort is to be expected once the numbing medicine wears off. This should be minimal if ice and heat are applied as instructed.  Call if: (When should I call?)  You experience numbness and weakness that gets worse with time, as opposed to wearing off.  New onset bowel or bladder incontinence. (Applies only to procedures done in the spine)  Emergency Numbers:  Durning business hours (Monday - Thursday, 8:00 AM - 4:00 PM) (Friday, 9:00 AM - 12:00 Noon): [phone]  After hours: [phone]  NOTE: If you are having a problem and are unable connect with, or to talk to a provider, then go to your nearest urgent care or emergency department. If the problem is serious and urgent, please call 911. ____________________________________________________________________________________________  It was good to see you today. Hope this procedure helps you.

## 2021-01-26 ENCOUNTER — Telehealth: Payer: Self-pay | Admitting: *Deleted

## 2021-01-26 ENCOUNTER — Other Ambulatory Visit: Payer: Self-pay | Admitting: Family Medicine

## 2021-01-26 DIAGNOSIS — I10 Essential (primary) hypertension: Secondary | ICD-10-CM

## 2021-01-26 NOTE — Telephone Encounter (Signed)
No problems post procedure. 

## 2021-01-26 NOTE — Telephone Encounter (Signed)
Requested Prescriptions  Pending Prescriptions Disp Refills  . hydrochlorothiazide (HYDRODIURIL) 12.5 MG tablet [Pharmacy Med Name: HYDROCHLOROTHIAZIDE 12.5MG  TABLETS] 90 tablet 0    Sig: TAKE 1 TABLET(12.5 MG) BY MOUTH DAILY     Cardiovascular: Diuretics - Thiazide Failed - 01/26/2021  8:00 AM      Failed - Cr in normal range and within 360 days    Creat  Date Value Ref Range Status  10/19/2020 1.13 (H) 0.60 - 0.93 mg/dL Final    Comment:    For patients >14 years of age, the reference limit for Creatinine is approximately 13% higher for people identified as African-American. .    Creatinine, Urine  Date Value Ref Range Status  10/19/2020 15 (L) 20 - 275 mg/dL Final         Passed - Ca in normal range and within 360 days    Calcium  Date Value Ref Range Status  10/19/2020 9.9 8.6 - 10.4 mg/dL Final         Passed - K in normal range and within 360 days    Potassium  Date Value Ref Range Status  10/19/2020 4.5 3.5 - 5.3 mmol/L Final         Passed - Na in normal range and within 360 days    Sodium  Date Value Ref Range Status  10/19/2020 138 135 - 146 mmol/L Final  08/25/2015 143 134 - 144 mmol/L Final         Passed - Last BP in normal range    BP Readings from Last 1 Encounters:  01/25/21 132/80         Passed - Valid encounter within last 6 months    Recent Outpatient Visits          1 month ago Well adult exam   [facility] Steele Sizer, MD   2 months ago Cervical radiculitis   [facility] Steele Sizer, MD   3 months ago Benign hypertension   [facility] Steele Sizer, MD   4 months ago Benign hypertension   [facility] Steele Sizer, MD   7 months ago Cervical radiculitis   [facility] Steele Sizer, MD      Future Appointments            In 1 month Ancil Boozer, Drue Stager, MD [facility], [place]   In 7 months  [facility], [facility]

## 2021-02-08 IMAGING — CR DG CERVICAL SPINE COMPLETE 4+V
1 series · 6 of 6 positions shown · non-contrast
Comparison: None

CLINICAL DATA: Neck pain

EXAM:
CERVICAL SPINE - COMPLETE 4+ VIEW

[Series 1: dg cervical spine complete · 0.14mm/px · 6 of 6 slices shown]
[im 1/6]
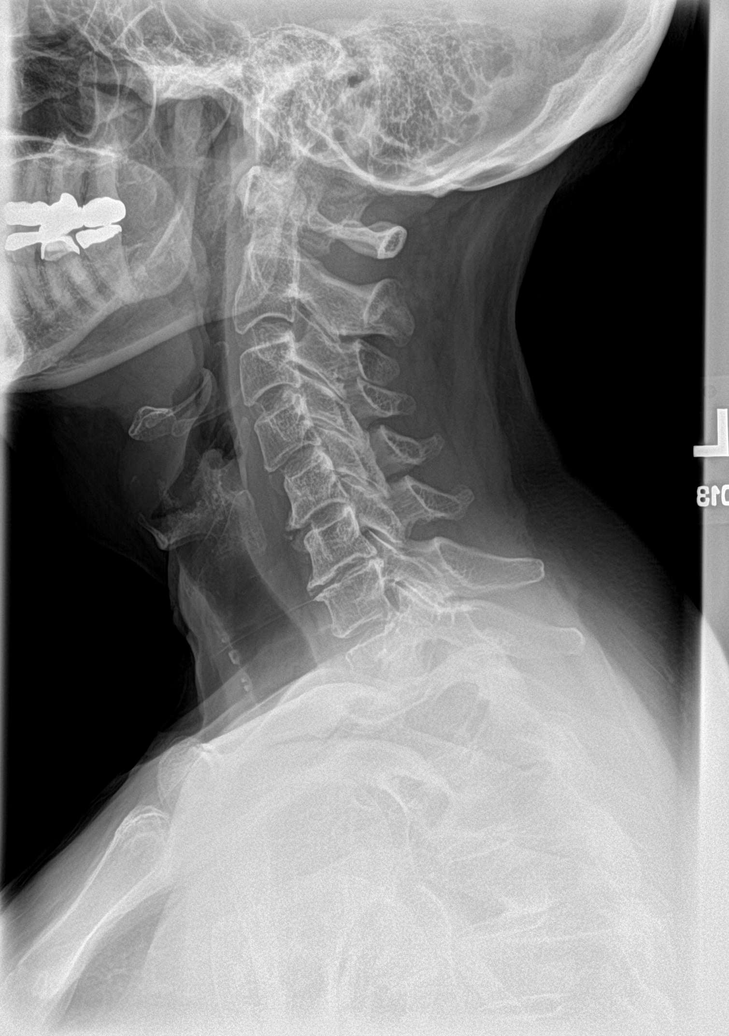
[im 2/6]
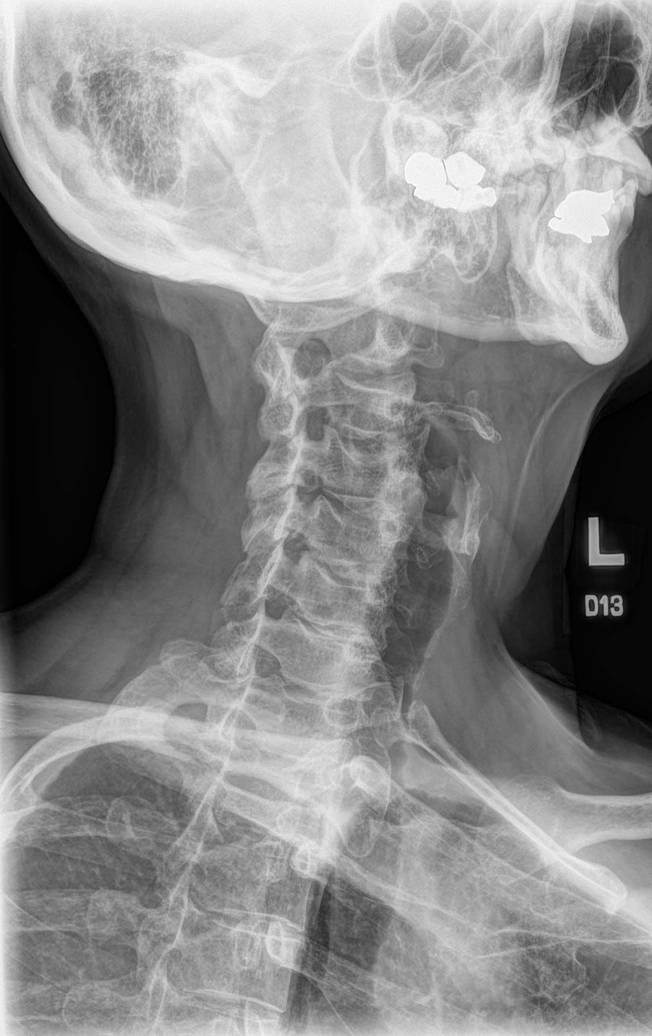
[im 3/6]
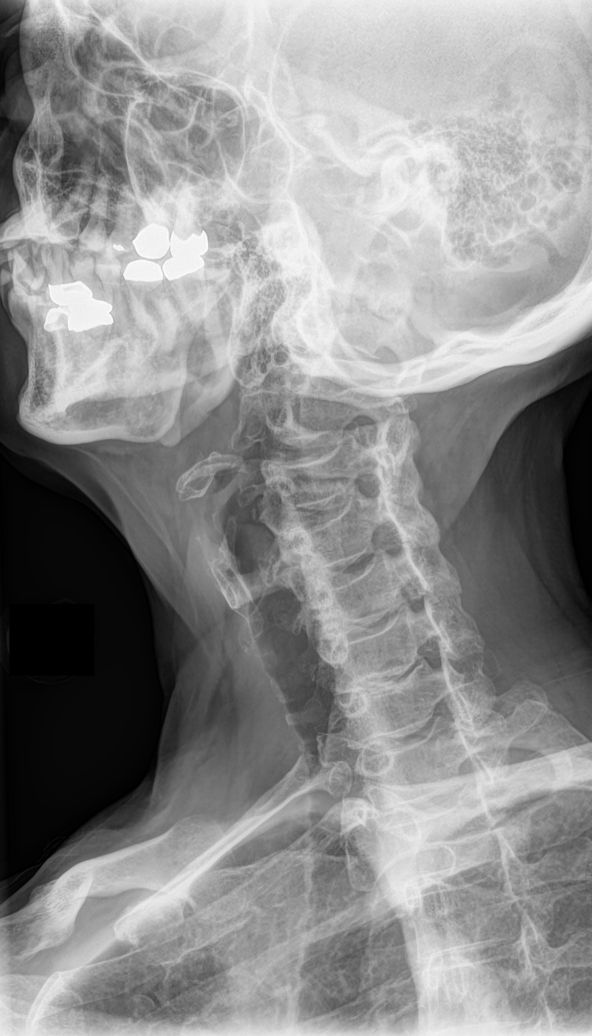
[im 4/6]
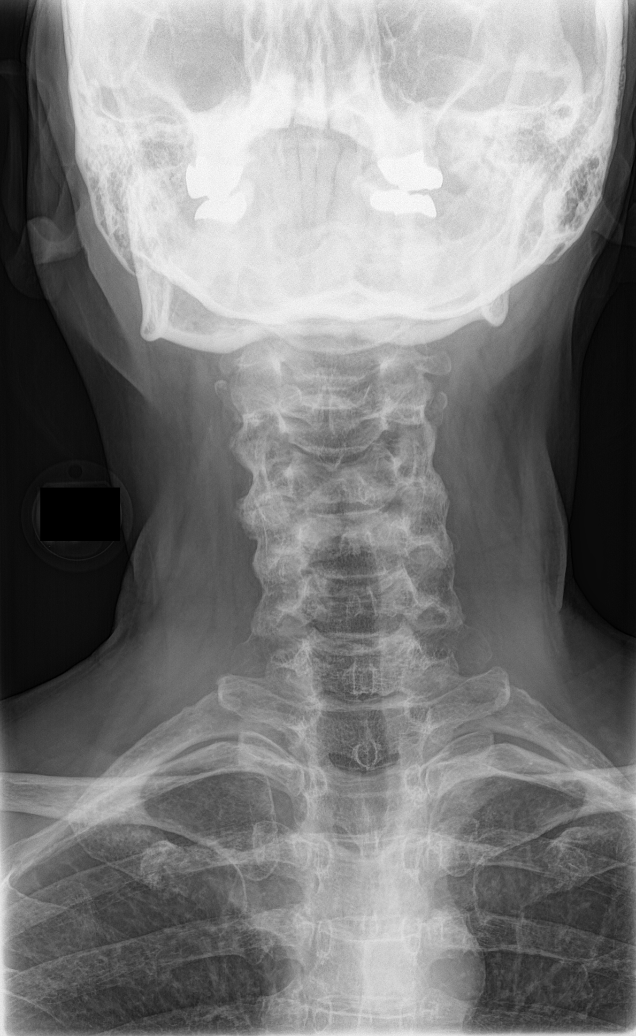
[im 5/6]
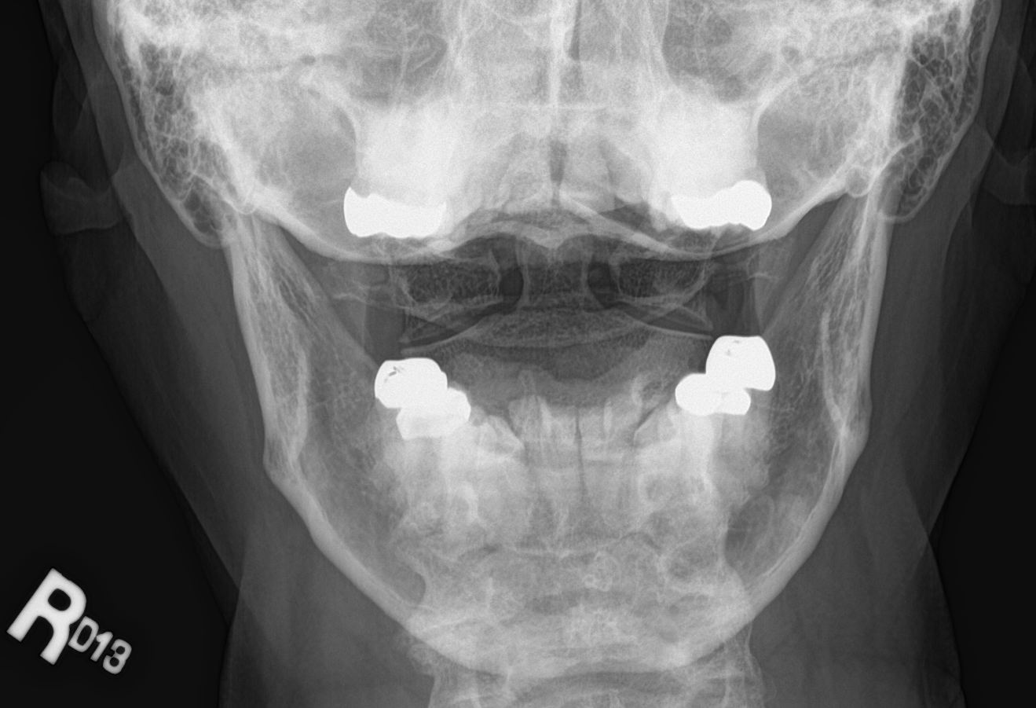
[im 6/6]
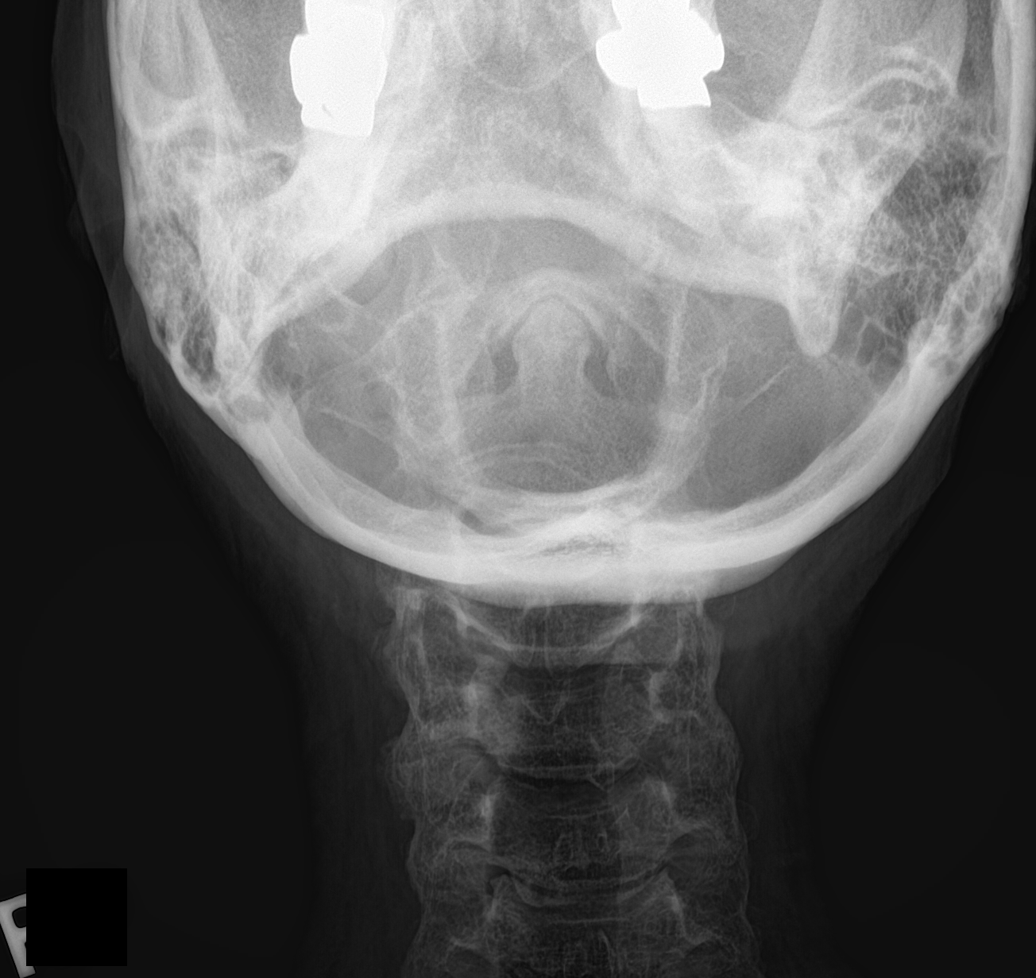

[6 of 6 positions shown; findings below may reference images not displayed]

FINDINGS: Disc space narrowing and spurring at C5-6 and C6-7. Bilateral
degenerative facet disease with bilateral neural foraminal
narrowing, on the right at C4-5 through C6-7 and on the lift at
C5-6. Prevertebral soft tissues are normal. Alignment is normal. No
fracture.
IMPRESSION: Degenerative disc and facet disease as above. No acute bony
abnormality.

## 2021-02-10 ENCOUNTER — Ambulatory Visit (INDEPENDENT_AMBULATORY_CARE_PROVIDER_SITE_OTHER): Payer: PPO

## 2021-02-10 ENCOUNTER — Other Ambulatory Visit: Payer: Self-pay

## 2021-02-10 DIAGNOSIS — E538 Deficiency of other specified B group vitamins: Secondary | ICD-10-CM

## 2021-02-11 ENCOUNTER — Encounter: Payer: Self-pay | Admitting: Student in an Organized Health Care Education/Training Program

## 2021-02-11 ENCOUNTER — Ambulatory Visit
Payer: PPO | Attending: Student in an Organized Health Care Education/Training Program | Admitting: Student in an Organized Health Care Education/Training Program

## 2021-02-11 VITALS — BP 103/71 | HR 71 | Temp 97.5°F | Ht 65.0 in | Wt 180.0 lb

## 2021-02-11 DIAGNOSIS — M47812 Spondylosis without myelopathy or radiculopathy, cervical region: Secondary | ICD-10-CM | POA: Diagnosis present

## 2021-02-11 DIAGNOSIS — M5030 Other cervical disc degeneration, unspecified cervical region: Secondary | ICD-10-CM | POA: Insufficient documentation

## 2021-02-11 DIAGNOSIS — G894 Chronic pain syndrome: Secondary | ICD-10-CM | POA: Insufficient documentation

## 2021-02-11 DIAGNOSIS — R221 Localized swelling, mass and lump, neck: Secondary | ICD-10-CM | POA: Insufficient documentation

## 2021-02-11 DIAGNOSIS — M5412 Radiculopathy, cervical region: Secondary | ICD-10-CM

## 2021-02-11 DIAGNOSIS — G43809 Other migraine, not intractable, without status migrainosus: Secondary | ICD-10-CM | POA: Insufficient documentation

## 2021-02-11 MED ORDER — PREGABALIN 50 MG PO CAPS: 100.0000 mg | ORAL_CAPSULE | Freq: Two times a day (BID) | ORAL | 2 refills | Status: DC

## 2021-02-11 MED ORDER — METHOCARBAMOL 500 MG PO TABS: 500.0000 mg | ORAL_TABLET | Freq: Three times a day (TID) | ORAL | 2 refills | Status: DC | PRN

## 2021-02-11 NOTE — Progress Notes (Signed)
PROVIDER NOTE: Information contained herein reflects review and annotations entered in association with encounter. Interpretation of such information and data should be left to medically-trained personnel. Information provided to patient can be located elsewhere in the medical record under "Patient Instructions". Document created using STT-dictation technology, any transcriptional errors that may result from process are unintentional.    Patient: Dawn Fuentes  Service Category: E/M  Provider: Gillis Santa, MD  DOB: 11-23-1946  DOS: 02/11/2021  Specialty: Interventional Pain Management  MRN: [id number]  Setting: Ambulatory outpatient  PCP: Steele Sizer, MD  Type: Established Patient    Referring Provider: Steele Sizer, MD  Location: Office  Delivery: Face-to-face     HPI  [name], a 75 y.o. year old female, is here today because of her Cervical facet joint syndrome [M47.812]. [name] primary complain today is Facial Pain Last encounter: My last encounter with her was on 01/25/2021. Pertinent problems: [name] does not have any pertinent problems on file. Pain Assessment: Severity of Chronic pain is reported as a 1 /10. Location: Face Right/Pain radiaties down her face to her neck. Onset: More than a month ago. Quality: Burning,Aching,Tender,Pressure. Timing: Constant. Modifying factor(s): meds, heat,. Vitals:  height is '5\' 5"'  (1.651 m) and weight is 180 lb (81.6 kg). Her temperature is 97.5 F (36.4 C) (abnormal). Her blood pressure is 103/71 and her pulse is 71. Her oxygen saturation is 99%.   Reason for encounter: post-procedure assessment.    S/P RIGHT C4,5,6 Fct medial nerve block on 01/25/21, states that it was not effective her facial pain although helped with her neck pain for that Is also having some right neck/SCM soft tissue swelling (present on palpation)- discussed referral to ENT Discussed occipital nerve blocks for possible occipital neuralgia however would  like patient to see ENT prior  Post-Procedure Evaluation  Procedure (01/25/2021):  Type: Cervical Facet Medial Branch Block(s)  #1  Primary Purpose: Diagnostic Region: Posterolateral cervical spine Level: C4, C5, C6, Medial Branch Level(s). Injecting these levels blocks the C4-5, C5-6, cervical facet joints. Laterality: Right  Sedation: Please see nurses note.  Effectiveness during initial hour after procedure(Ultra-Short Term Relief): 100 %   Local anesthetic used: Long-acting (4-6 hours) Effectiveness: Defined as any analgesic benefit obtained secondary to the administration of local anesthetics. This carries significant diagnostic value as to the etiological location, or anatomical origin, of the pain. Duration of benefit is expected to coincide with the duration of the local anesthetic used.  Effectiveness during initial 4-6 hours after procedure(Short-Term Relief): 100 %   Long-term benefit: Defined as any relief past the pharmacologic duration of the local anesthetics.  Effectiveness past the initial 6 hours after procedure(Long-Term Relief): 100 % (last for one day)   Current benefits: Defined as benefit that persist at this time.   Analgesia:  Back to baseline Function: Back to baseline ROM: Back to baseline   ROS  Constitutional: Denies any fever or chills Gastrointestinal: No reported hemesis, hematochezia, vomiting, or acute GI distress Neurological: right facial pain  Medication Review  ALPRAZolam, Latanoprostene Bunod, Omega-3 Fatty Acids, aspirin, baclofen, busPIRone, cholecalciferol, citalopram, clobetasol ointment, denosumab, fluticasone, hydrALAZINE, hydrochlorothiazide, levocetirizine, levothyroxine, losartan, methocarbamol, montelukast, pantoprazole, pregabalin, simvastatin, timolol, and zolpidem  History Review  Allergy: [name] is allergic to brimonidine tartrate, latanoprost, meloxicam, and raloxifene. Drug: [name]  reports no history of drug  use. Alcohol:  reports no history of alcohol use. Tobacco:  reports that she has never smoked. She has never used smokeless tobacco.  Social: [name]  reports that she has never smoked. She has never used smokeless tobacco. She reports that she does not drink alcohol and does not use drugs. Medical:  has a past medical history of Allergy, Anxiety, Arthritis, Chronic insomnia, Colon adenoma, Dyslipidemia, Dysphagia, Female stress incontinence, GERD (gastroesophageal reflux disease), Glaucoma, both eyes, Hyperlipidemia, Hypertension, Hypothyroidism, Menopause syndrome, Osteoporosis, Polyp of colon, Renal insufficiency, and Vitamin D deficiency. Surgical: [name]  has a past surgical history that includes Ovarian cyst removal; Colonoscopy with propofol (N/A, 05/29/2015); Esophagogastroduodenoscopy (N/A, 05/29/2015); Appendectomy; Abdominal hysterectomy; Eye surgery; Photocoagulation with laser (Right, 11/21/2016); Esophagogastroduodenoscopy (N/A, 09/14/2020); and Colonoscopy (N/A, 09/14/2020). Family: family history includes Aneurysm in her brother; COPD in her mother and sister; Cancer in her father; Dementia in her mother; Hypertension in her mother; Hypothyroidism in her daughter; Osteoporosis in her mother.  Laboratory Chemistry Profile   Renal Lab Results  Component Value Date   BUN 15 10/19/2020   CREATININE 1.13 (H) 10/19/2020   LABCREA 15 (L) 10/19/2020   BCR 13 10/19/2020   GFRAA 56 (L) 10/19/2020   GFRNONAA 49 (L) 10/19/2020     Hepatic Lab Results  Component Value Date   AST 53 (H) 10/19/2020   ALT 54 (H) 10/19/2020   ALBUMIN 4.2 03/08/2017   ALKPHOS 50 03/08/2017     Electrolytes Lab Results  Component Value Date   NA 138 10/19/2020   K 4.5 10/19/2020   CL 104 10/19/2020   CALCIUM 9.9 10/19/2020     Bone Lab Results  Component Value Date   VD25OH 35 03/18/2020     Inflammation (CRP: Acute Phase) (ESR: Chronic Phase) No results found for: CRP, ESRSEDRATE,  LATICACIDVEN     Note: Above Lab results reviewed.  Physical Exam  General appearance: Well nourished, well developed, and well hydrated. In no apparent acute distress Mental status: Alert, oriented x 3 (person, place, & time)       Respiratory: No evidence of acute respiratory distress Eyes: PERLA Vitals: BP 103/71   Pulse 71   Temp (!) 97.5 F (36.4 C)   Ht '5\' 5"'  (1.651 m)   Wt 180 lb (81.6 kg)   LMP  (LMP Unknown)   SpO2 99%   BMI 29.95 kg/m  BMI: Estimated body mass index is 29.95 kg/m as calculated from the following:   Height as of this encounter: '5\' 5"'  (1.651 m).   Weight as of this encounter: 180 lb (81.6 kg). Ideal: Ideal body weight: 57 kg (125 lb 10.6 oz) Adjusted ideal body weight: 66.9 kg (147 lb 6.4 oz)  Cervical Spine Area Exam  Skin & Axial Inspection: No masses, redness, edema, swelling, or associated skin lesions Alignment: Symmetrical Functional ROM: Decreased ROM, to the right Stability: No instability detected Muscle Tone/Strength: Functionally intact. No obvious neuro-muscular anomalies detected. Sensory (Neurological): Musculoskeletal pain pattern Palpation: Complains of area being tender to palpation  small right neck mass superior to sternocleidomastoid  Assessment   Status Diagnosis  Persistent Persistent Persistent 1. Cervical facet joint syndrome   2. Cervicogenic migraine   3. Degeneration of cervical intervertebral disc   4. Cervical radiculitis   5. Chronic pain syndrome   6. Neck swelling      Plan of Care   [name] has a current medication list which includes the following long-term medication(s): citalopram, fluticasone, hydralazine, hydrochlorothiazide, levocetirizine, levothyroxine, losartan, montelukast, pantoprazole, pregabalin, simvastatin, and zolpidem.  1.  Unfortunately no significant pain relief with diagnostic cervical facet blocks for  right sternocleidomastoid and right facial pain. 2.  I recommend she  increase her Lyrica to 100 mg twice a day, increase Robaxin to 500 mg 3 times daily as needed 3.  Referral to ENT for small right neck mass superior to sternocleidomastoid, referral to Dr. Tami Ribas 4.  Consider diagnostic occipital nerve block 5.  Consider Botox for atypical migraine  Pharmacotherapy (Medications Ordered): Meds ordered this encounter  Medications  . pregabalin (LYRICA) 50 MG capsule    Sig: Take 2 capsules (100 mg total) by mouth 2 (two) times daily.    Dispense:  120 capsule    Refill:  2  . methocarbamol (ROBAXIN) 500 MG tablet    Sig: Take 1 tablet (500 mg total) by mouth every 8 (eight) hours as needed for muscle spasms.    Dispense:  60 tablet    Refill:  2    Do not place this medication, or any other prescription from our practice, on "Automatic Refill". Patient may have prescription filled one day early if pharmacy is closed on scheduled refill date.   Orders:  Orders Placed This Encounter  Procedures  . Ambulatory referral to ENT    Referral Priority:   Routine    Referral Type:   Consultation    Referral Reason:   Specialty Services Required    Requested Specialty:   Otolaryngology    Number of Visits Requested:   1  . Ambulatory referral to ENT    Referral Priority:   Routine    Referral Type:   Consultation    Referral Reason:   Specialty Services Required    Referred to Provider:   Beverly Gust, MD    Requested Specialty:   Otolaryngology    Number of Visits Requested:   1   Follow-up plan:   Return in about 8 weeks (around 04/08/2021) for Medication Management, in person.     C7-T1 ESI, not effective, right C4, C5, C6 cervical facet medial branch nerve block #1 01/25/2021:100% pain relief for 1 day,  consider botox     Recent Visits Date Type Provider Dept  01/25/21 Procedure visit Gillis Santa, MD [facility]  01/14/21 Telemedicine Gillis Santa, MD [facility]  12/21/20 Procedure visit Gillis Santa, MD [facility]  12/01/20 Office Visit Gillis Santa, MD [facility]  Showing recent visits within past 90 days and meeting all other requirements Today's Visits Date Type Provider Dept  02/11/21 Office Visit Gillis Santa, MD [facility]  Showing today's visits and meeting all other requirements Future Appointments Date Type Provider Dept  03/30/21 Appointment Gillis Santa, MD [facility]  Showing future appointments within next 90 days and meeting all other requirements  I discussed the assessment and treatment plan with the patient. The patient was provided an opportunity to ask questions and all were answered. The patient agreed with the plan and demonstrated an understanding of the instructions.  Patient advised to call back or seek an in-person evaluation if the symptoms or condition worsens.  Duration of encounter: 53mnutes.  Note by: BGillis Santa MD Date: 02/11/2021; Time: 3:05 PM

## 2021-02-11 NOTE — Progress Notes (Signed)
Safety precautions to be maintained throughout the outpatient stay will include: orient to surroundings, keep bed in low position, maintain call bell within reach at all times, provide assistance with transfer out of bed and ambulation.  

## 2021-02-23 ENCOUNTER — Other Ambulatory Visit: Payer: Self-pay | Admitting: Otolaryngology

## 2021-02-23 DIAGNOSIS — R221 Localized swelling, mass and lump, neck: Secondary | ICD-10-CM | POA: Diagnosis not present

## 2021-02-23 DIAGNOSIS — M542 Cervicalgia: Secondary | ICD-10-CM | POA: Diagnosis not present

## 2021-02-28 ENCOUNTER — Other Ambulatory Visit: Payer: Self-pay | Admitting: Family Medicine

## 2021-02-28 DIAGNOSIS — K219 Gastro-esophageal reflux disease without esophagitis: Secondary | ICD-10-CM

## 2021-02-28 NOTE — Telephone Encounter (Signed)
Requested Prescriptions  Pending Prescriptions Disp Refills  . pantoprazole (PROTONIX) 40 MG tablet [Pharmacy Med Name: PANTOPRAZOLE 40MG  TABLETS] 90 tablet 2    Sig: TAKE 1 TABLET(40 MG) BY MOUTH DAILY     Gastroenterology: Proton Pump Inhibitors Passed - 02/28/2021  7:47 AM      Passed - Valid encounter within last 12 months    Recent Outpatient Visits          2 months ago Well adult exam   [facility] Steele Sizer, MD   3 months ago Cervical radiculitis   [facility] Steele Sizer, MD   4 months ago Benign hypertension   [facility] Steele Sizer, MD   5 months ago Benign hypertension   [facility] Steele Sizer, MD   8 months ago Cervical radiculitis   [facility] Steele Sizer, MD      Future Appointments            In 1 week Steele Sizer, MD [facility], [place]   In 6 months  [facility], [facility]

## 2021-03-02 DIAGNOSIS — H401132 Primary open-angle glaucoma, bilateral, moderate stage: Secondary | ICD-10-CM | POA: Diagnosis not present

## 2021-03-07 ENCOUNTER — Other Ambulatory Visit: Payer: Self-pay | Admitting: Family Medicine

## 2021-03-07 DIAGNOSIS — E039 Hypothyroidism, unspecified: Secondary | ICD-10-CM

## 2021-03-07 NOTE — Telephone Encounter (Signed)
Requested Prescriptions  Pending Prescriptions Disp Refills  . levothyroxine (SYNTHROID) 50 MCG tablet [Pharmacy Med Name: LEVOTHYROXINE 0.05MG  (50MCG) TAB] 128 tablet 0    Sig: TAKE 2 TABLETS BY MOUTH DAILY BEFORE BREAKFAST ON MONDAY, WEDNESDAY, FRIDAY AND 1 TABLET ON ALL OTHER DAYS OF THE WEEK     Endocrinology:  Hypothyroid Agents Failed - 03/07/2021  4:30 PM      Failed - TSH needs to be rechecked within 3 months after an abnormal result. Refill until TSH is due.      Passed - TSH in normal range and within 360 days    TSH  Date Value Ref Range Status  11/30/2020 0.56 0.40 - 4.50 mIU/L Final         Passed - Valid encounter within last 12 months    Recent Outpatient Visits          3 months ago Well adult exam   [facility] Steele Sizer, MD   4 months ago Cervical radiculitis   [facility] Steele Sizer, MD   4 months ago Benign hypertension   [facility] Steele Sizer, MD   6 months ago Benign hypertension   [facility] Steele Sizer, MD   8 months ago Cervical radiculitis   [facility] Steele Sizer, MD      Future Appointments            In 3 days Steele Sizer, MD [facility], [place]   In 6 months  [facility], [facility]

## 2021-03-08 ENCOUNTER — Ambulatory Visit: Payer: PPO | Admitting: Family Medicine

## 2021-03-08 NOTE — Progress Notes (Signed)
Name: Dawn Fuentes   MRN: [id number]    DOB: 05/27/1946   Date:03/10/2021       Progress Note  Subjective  Chief Complaint  Follow Up  HPI  GAD: she is feeling much better on Citalopram 20 mg, she takes alprazolam seldom, about 2 times a week. She is feeling more calm. She needs refill of BZD.   Hypothyroidism: she is taking medication as prescribed, no hair loss, palpitation or change in bowel movementsLast TSH at goal . She is taking two pills M, W and Fridays and one daily after that We will recheck labs today   DDD cervical spine with radiculitis, seen by Dr. Trenton Gammon and started having PT at [place] last week and she has noticed improvement of frequency of headaches, she still has radiculitis going down to upper arm but only when rotating to right side. She also had a visit with Dr. Holley Raring , she had steroid injection done in 12/2020 , it relieved the headache for one day, she had a nerve block 01/2021 and also worked for only one day. She was referred by Dr. Holley Raring to Dr. Richardson Landry and she will have a CT soft tissue neck due to fullness on right side of neck and pain still radiating from right side of neck,  to anterior right ear. The pain can go up to 8/10 if she does not take medication right away, usually improves with Tylenol. I gave Lyrica but she is not sure if it is helping with pain. The muscle relaxer seems to help more than Lyrica , discussed importance of letting Dr. Holley Raring know about it   HTN/CKI: stage III , no pruritis, bp has been at goal, no chest pain, palpitation or SOB  Dyslipidemia: taking simvastatin, we will recheck labs, denies chest pain or palpitation   Patient Active Problem List   Diagnosis Date Noted  . Cervicogenic migraine 01/14/2021  . Cervical facet joint syndrome 01/14/2021  . Cervical radiculitis 12/01/2020  . Bilateral hip pain 12/01/2020  . Chronic bilateral low back pain without sciatica 12/01/2020  . Chronic SI joint pain 12/01/2020  . Chronic  pain syndrome 12/01/2020  . Body mass index (BMI) 30.0-30.9, adult 11/18/2020  . Essential (primary) hypertension 11/18/2020  . Cervical radiculopathy 11/18/2020  . Degeneration of cervical intervertebral disc 06/25/2020  . Lichen sclerosus et atrophicus of the vulva 11/04/2019  . [name] deficiency 12/26/2018  . Osteoarthritis of knees, bilateral 05/26/2017  . Primary open angle glaucoma of both eyes 11/21/2016  . Chronic kidney disease (CKD), stage III (moderate) ([place]) 04/06/2016  . Cystitis 10/12/2015  . Benign hypertension 08/21/2015  . Insomnia, persistent 08/21/2015  . Dyslipidemia 08/21/2015  . Gastro-esophageal reflux disease without esophagitis 08/21/2015  . Glaucoma 08/21/2015  . Blood glucose elevated 08/21/2015  . Adult hypothyroidism 08/21/2015  . Climacteric 08/21/2015  . Senile osteoporosis 08/21/2015  . Seasonal allergies 08/21/2015  . Female genuine stress incontinence 08/21/2015  . Vitamin D deficiency 08/21/2015    Past Surgical History:  Procedure Laterality Date  . ABDOMINAL HYSTERECTOMY     21 years ago  . APPENDECTOMY    . COLONOSCOPY N/A 09/14/2020   Procedure: COLONOSCOPY;  Surgeon: Lesly Rubenstein, MD;  Location: [facility] ENDOSCOPY;  Service: Endoscopy;  Laterality: N/A;  . COLONOSCOPY WITH PROPOFOL N/A 05/29/2015   Procedure: COLONOSCOPY WITH PROPOFOL;  Surgeon: Hulen Luster, MD;  Location: [facility] ENDOSCOPY;  Service: Gastroenterology;  Laterality: N/A;  . ESOPHAGOGASTRODUODENOSCOPY N/A 05/29/2015   Procedure: ESOPHAGOGASTRODUODENOSCOPY (EGD);  Surgeon: Eddie Dibbles  Johnell Comings, MD;  Location: [facility] ENDOSCOPY;  Service: Gastroenterology;  Laterality: N/A;  . ESOPHAGOGASTRODUODENOSCOPY N/A 09/14/2020   Procedure: ESOPHAGOGASTRODUODENOSCOPY (EGD);  Surgeon: Lesly Rubenstein, MD;  Location[facility] ENDOSCOPY;  Service: Endoscopy;  Laterality: N/A;  . EYE SURGERY     eye lids  . OVARIAN CYST REMOVAL     needed transfusion  . PHOTOCOAGULATION WITH LASER Right 11/21/2016    Procedure: PHOTOCOAGULATION WITH LASER;  Surgeon: Ronnell Freshwater, MD;  Location: [place];  Service: Ophthalmology;  Laterality: Right;  A micropulse probe was applied to each hemilimbus with the following settings: 2035mW, 31.3% duty cycle, 90 seconds x 3 applications.     Family History  Problem Relation Age of Onset  . Hypertension Mother   . Osteoporosis Mother   . COPD Mother   . Dementia Mother   . Cancer Father        Colon  . Hypothyroidism Daughter   . Aneurysm Brother   . COPD Sister     Social History   Tobacco Use  . Smoking status: Never Smoker  . Smokeless tobacco: Never Used  . Tobacco comment: smoking cessation materials not required  Substance Use Topics  . Alcohol use: No    Alcohol/week: 0.0 standard drinks     Current Outpatient Medications:  .  aspirin 81 MG tablet, Take 81 mg by mouth daily. pm, Disp: , Rfl:  .  cholecalciferol (VITAMIN D) 1000 units tablet, Take 1,000 Units by mouth 2 (two) times daily. am, Disp: , Rfl:  .  clobetasol ointment (TEMOVATE) 0.05 %, Apply to affected area every night for 4 weeks, then every other day for 4 weeks and then twice a week for 4 weeks or until resolution., Disp: 30 g, Rfl: 5 .  denosumab (PROLIA) 60 MG/ML SOSY injection, Inject into the skin every 6 (six) months. , Disp: , Rfl:  .  fluticasone (FLONASE) 50 MCG/ACT nasal spray, Place 2 sprays into both nostrils daily. Pm, Disp: 16 g, Rfl: 2 .  hydrALAZINE (APRESOLINE) 25 MG tablet, TAKE 1 TABLET BY MOUTH THREE TIMES DAILY; IF BLOOD PRESSURE ABOVE 140/90, Disp: 270 tablet, Rfl: 0 .  levocetirizine (XYZAL) 5 MG tablet, Take 1 tablet (5 mg total) by mouth every evening., Disp: 90 tablet, Rfl: 1 .  levothyroxine (SYNTHROID) 50 MCG tablet, TAKE 2 TABLETS BY MOUTH DAILY BEFORE BREAKFAST ON MONDAY, WEDNESDAY, FRIDAY AND 1 TABLET ON ALL OTHER DAYS OF THE WEEK, Disp: 128 tablet, Rfl: 0 .  losartan-hydrochlorothiazide (HYZAAR) 100-12.5 MG tablet, Take 1  tablet by mouth daily., Disp: 90 tablet, Rfl: 1 .  methocarbamol (ROBAXIN) 500 MG tablet, Take 1 tablet (500 mg total) by mouth every 8 (eight) hours as needed for muscle spasms., Disp: 60 tablet, Rfl: 2 .  Omega-3 Fatty Acids (FISH OIL PO), Take by mouth. AM, Disp: , Rfl:  .  pantoprazole (PROTONIX) 40 MG tablet, TAKE 1 TABLET(40 MG) BY MOUTH DAILY, Disp: 90 tablet, Rfl: 2 .  pregabalin (LYRICA) 50 MG capsule, Take 2 capsules (100 mg total) by mouth 2 (two) times daily., Disp: 120 capsule, Rfl: 2 .  simvastatin (ZOCOR) 40 MG tablet, Take 1 tablet (40 mg total) by mouth daily., Disp: 90 tablet, Rfl: 1 .  timolol (TIMOPTIC) 0.5 % ophthalmic solution, INT 1 GTT IN OU BID, Disp: , Rfl: 5 .  VYZULTA 0.024 % SOLN, INT 1 GTT INTO OU AT NIGHT, Disp: , Rfl: 2 .  ALPRAZolam (XANAX) 0.5 MG tablet, Take 1 tablet (  0.5 mg total) by mouth daily as needed for anxiety. Rarely, Disp: 12 tablet, Rfl: 1 .  citalopram (CELEXA) 20 MG tablet, Take 1 tablet (20 mg total) by mouth daily., Disp: 90 tablet, Rfl: 1 .  zolpidem (AMBIEN) 5 MG tablet, Take qhs, Disp: 90 tablet, Rfl: 1  Current Facility-Administered Medications:  .  cyanocobalamin ((VITAMIN B-12)) injection 1,000 mcg, 1,000 mcg, Intramuscular, Q30 days, Steele Sizer, MD, 1,000 mcg at 02/10/21 1611  Allergies  Allergen Reactions  . Brimonidine Tartrate   . Latanoprost   . Meloxicam     Increased blood pressure   . Raloxifene     bone pain    I personally reviewed active problem list, medication list, allergies, family history, social history, health maintenance with the patient/caregiver today.   ROS  Constitutional: Negative for fever, positive for  weight change.  Respiratory: Negative for cough and shortness of breath.   Cardiovascular: Negative for chest pain or palpitations.  Gastrointestinal: Negative for abdominal pain, no bowel changes.  Musculoskeletal: Negative for gait problem or joint swelling.  Skin: Negative for rash.   Neurological: Negative for dizziness, positive for  headache.  No other specific complaints in a complete review of systems (except as listed in HPI above).  Objective  Vitals:   03/10/21 1319  BP: 136/76  Pulse: 96  Resp: 16  Temp: 98 F (36.7 C)  TempSrc: Oral  SpO2: 98%  Weight: 185 lb (83.9 kg)  Height: 5\' 5"  (1.651 m)    Body mass index is 30.79 kg/m.  Physical Exam  Constitutional: Patient appears well-developed and well-nourished. Obese  No distress.  HEENT: head atraumatic, normocephalic, pupils equal and reactive to light,  neck supple Cardiovascular: Normal rate, regular rhythm and normal heart sounds.  No murmur heard. No BLE edema. Pulmonary/Chest: Effort normal and breath sounds normal. No respiratory distress. Abdominal: Soft.  There is no tenderness. Psychiatric: Patient has a normal mood and affect. behavior is normal. Judgment and thought content normal.  PHQ2/9: Depression screen [facility] 2/9 03/10/2021 12/07/2020 12/01/2020 11/05/2020 10/19/2020  Decreased Interest 0 0 0 1 0  Down, Depressed, Hopeless 0 0 0 1 0  PHQ - 2 Score 0 0 0 2 0  Altered sleeping - 0 - 1 -  Tired, decreased energy - 0 - 1 -  Change in appetite - 0 - 0 -  Feeling bad or failure about yourself  - 0 - 0 -  Trouble concentrating - 0 - 0 -  Moving slowly or fidgety/restless - 0 - 0 -  Suicidal thoughts - 0 - 0 -  PHQ-9 Score - 0 - 4 -  Difficult doing work/chores - Not difficult at all - Not difficult at all -  Some recent data might be hidden    phq 9 is negative   Fall Risk: Fall Risk  03/10/2021 02/11/2021 12/21/2020 12/07/2020 12/01/2020  Falls in the past year? 0 0 0 0 0  Number falls in past yr: 0 - 0 0 0  Injury with Fall? 0 - - 0 0  Risk for fall due to : - - No Fall Risks - -  Risk for fall due to: Comment - - - - -  Follow up - - Falls evaluation completed - -     Functional Status Survey: Is the patient deaf or have difficulty hearing?: No Does the patient have  difficulty seeing, even when wearing glasses/contacts?: No Does the patient have difficulty concentrating, remembering, or making decisions?: No Does  the patient have difficulty walking or climbing stairs?: No Does the patient have difficulty dressing or bathing?: No Does the patient have difficulty doing errands alone such as visiting a doctor's office or shopping?: No    Assessment & Plan  1. GAD (generalized anxiety disorder)  - citalopram (CELEXA) 20 MG tablet; Take 1 tablet (20 mg total) by mouth daily.  Dispense: 90 tablet; Refill: 1 - ALPRAZolam (XANAX) 0.5 MG tablet; Take 1 tablet (0.5 mg total) by mouth daily as needed for anxiety. Rarely  Dispense: 12 tablet; Refill: 1  2. Insomnia, persistent  - zolpidem (AMBIEN) 5 MG tablet; Take qhs  Dispense: 90 tablet; Refill: 1  3. Perennial allergic rhinitis with seasonal variation   4. Vitamin D deficiency   5. Adult hypothyroidism  - TSH  6. Vitamin [name] deficiency  Today   7. Stage 3a chronic kidney disease ([place])   8. DDD (degenerative disc disease), cervical  Keep follow up with Dr. Holley Raring   9. Nonintractable episodic headache, unspecified headache type  Keep follow up with Dr. Holley Raring   10. Benign hypertension  - losartan-hydrochlorothiazide (HYZAAR) 100-12.5 MG tablet; Take 1 tablet by mouth daily.  Dispense: 90 tablet; Refill: 1 - CBC with Differential/Platelet - COMPLETE METABOLIC PANEL WITH GFR  11. Dyslipidemia  - Lipid panel  12. Hyperglycemia  - Hemoglobin A1c

## 2021-03-09 DIAGNOSIS — H402233 Chronic angle-closure glaucoma, bilateral, severe stage: Secondary | ICD-10-CM | POA: Diagnosis not present

## 2021-03-10 ENCOUNTER — Encounter: Payer: Self-pay | Admitting: Family Medicine

## 2021-03-10 ENCOUNTER — Ambulatory Visit (INDEPENDENT_AMBULATORY_CARE_PROVIDER_SITE_OTHER): Payer: PPO | Admitting: Family Medicine

## 2021-03-10 ENCOUNTER — Other Ambulatory Visit: Payer: Self-pay

## 2021-03-10 VITALS — BP 136/76 | HR 96 | Temp 98.0°F | Resp 16 | Ht 65.0 in | Wt 185.0 lb

## 2021-03-10 DIAGNOSIS — E538 Deficiency of other specified B group vitamins: Secondary | ICD-10-CM

## 2021-03-10 DIAGNOSIS — R739 Hyperglycemia, unspecified: Secondary | ICD-10-CM | POA: Diagnosis not present

## 2021-03-10 DIAGNOSIS — J302 Other seasonal allergic rhinitis: Secondary | ICD-10-CM

## 2021-03-10 DIAGNOSIS — R519 Headache, unspecified: Secondary | ICD-10-CM | POA: Diagnosis not present

## 2021-03-10 DIAGNOSIS — E559 Vitamin D deficiency, unspecified: Secondary | ICD-10-CM | POA: Diagnosis not present

## 2021-03-10 DIAGNOSIS — I10 Essential (primary) hypertension: Secondary | ICD-10-CM | POA: Diagnosis not present

## 2021-03-10 DIAGNOSIS — E039 Hypothyroidism, unspecified: Secondary | ICD-10-CM | POA: Diagnosis not present

## 2021-03-10 DIAGNOSIS — F411 Generalized anxiety disorder: Secondary | ICD-10-CM

## 2021-03-10 DIAGNOSIS — N1831 Chronic kidney disease, stage 3a: Secondary | ICD-10-CM | POA: Diagnosis not present

## 2021-03-10 DIAGNOSIS — J3089 Other allergic rhinitis: Secondary | ICD-10-CM | POA: Diagnosis not present

## 2021-03-10 DIAGNOSIS — E785 Hyperlipidemia, unspecified: Secondary | ICD-10-CM | POA: Diagnosis not present

## 2021-03-10 DIAGNOSIS — G4700 Insomnia, unspecified: Secondary | ICD-10-CM

## 2021-03-10 DIAGNOSIS — M5030 Other cervical disc degeneration, unspecified cervical region: Secondary | ICD-10-CM | POA: Diagnosis not present

## 2021-03-10 MED ORDER — ALPRAZOLAM 0.5 MG PO TABS: 0.5000 mg | ORAL_TABLET | Freq: Every day | ORAL | 1 refills | Status: DC | PRN

## 2021-03-10 MED ORDER — CITALOPRAM HYDROBROMIDE 20 MG PO TABS: 20.0000 mg | ORAL_TABLET | Freq: Every day | ORAL | 1 refills | Status: DC

## 2021-03-10 MED ORDER — LOSARTAN POTASSIUM-HCTZ 100-12.5 MG PO TABS: 1.0000 | ORAL_TABLET | Freq: Every day | ORAL | 1 refills | Status: DC

## 2021-03-10 MED ORDER — ZOLPIDEM TARTRATE 5 MG PO TABS: ORAL_TABLET | ORAL | 1 refills | Status: DC

## 2021-03-12 ENCOUNTER — Ambulatory Visit
Admission: RE | Admit: 2021-03-12 | Discharge: 2021-03-12 | Disposition: A | Discharge status: Home / Self Care | Payer: PPO | Source: Ambulatory Visit | Attending: Otolaryngology | Admitting: Otolaryngology

## 2021-03-12 ENCOUNTER — Other Ambulatory Visit: Payer: Self-pay

## 2021-03-12 DIAGNOSIS — M50321 Other cervical disc degeneration at C4-C5 level: Secondary | ICD-10-CM | POA: Diagnosis not present

## 2021-03-12 DIAGNOSIS — R221 Localized swelling, mass and lump, neck: Secondary | ICD-10-CM

## 2021-03-12 DIAGNOSIS — M50322 Other cervical disc degeneration at C5-C6 level: Secondary | ICD-10-CM | POA: Diagnosis not present

## 2021-03-12 DIAGNOSIS — K118 Other diseases of salivary glands: Secondary | ICD-10-CM | POA: Diagnosis not present

## 2021-03-12 DIAGNOSIS — M50323 Other cervical disc degeneration at C6-C7 level: Secondary | ICD-10-CM | POA: Diagnosis not present

## 2021-03-12 LAB — POCT I-STAT CREATININE: Creatinine, Ser: 1.1 mg/dL — ABNORMAL HIGH (ref 0.44–1.00)

## 2021-03-12 MED ORDER — IOHEXOL 300 MG/ML  SOLN
75.0000 mL | Freq: Once | INTRAMUSCULAR | Status: AC | PRN
  Administered 2021-03-12: 75 mL via INTRAVENOUS

## 2021-03-17 ENCOUNTER — Other Ambulatory Visit: Payer: Self-pay | Admitting: Otolaryngology

## 2021-03-17 DIAGNOSIS — K118 Other diseases of salivary glands: Secondary | ICD-10-CM

## 2021-03-22 ENCOUNTER — Other Ambulatory Visit: Payer: Self-pay | Admitting: Radiology

## 2021-03-23 ENCOUNTER — Other Ambulatory Visit: Payer: Self-pay

## 2021-03-23 ENCOUNTER — Ambulatory Visit
Admission: RE | Admit: 2021-03-23 | Discharge: 2021-03-23 | Disposition: A | Discharge status: Home / Self Care | Payer: PPO | Source: Ambulatory Visit | Attending: Otolaryngology | Admitting: Otolaryngology

## 2021-03-23 DIAGNOSIS — K118 Other diseases of salivary glands: Secondary | ICD-10-CM | POA: Insufficient documentation

## 2021-03-23 DIAGNOSIS — D110 Benign neoplasm of parotid gland: Secondary | ICD-10-CM | POA: Insufficient documentation

## 2021-03-23 NOTE — Procedures (Signed)
Interventional Radiology Procedure Note  Procedure: [place] BX RT PAROTID MASS    Complications: None  Estimated Blood Loss:  MIN  Findings: 55 G CORES X3    M. Daryll Brod, MD

## 2021-03-24 LAB — SURGICAL PATHOLOGY

## 2021-03-30 ENCOUNTER — Ambulatory Visit
Payer: PPO | Attending: Student in an Organized Health Care Education/Training Program | Admitting: Student in an Organized Health Care Education/Training Program

## 2021-03-30 ENCOUNTER — Encounter: Payer: Self-pay | Admitting: Student in an Organized Health Care Education/Training Program

## 2021-03-30 ENCOUNTER — Other Ambulatory Visit: Payer: Self-pay

## 2021-03-30 VITALS — BP 151/91 | HR 72 | Temp 96.9°F | Resp 18 | Ht 65.0 in | Wt 180.0 lb

## 2021-03-30 DIAGNOSIS — G43809 Other migraine, not intractable, without status migrainosus: Secondary | ICD-10-CM | POA: Insufficient documentation

## 2021-03-30 DIAGNOSIS — M5030 Other cervical disc degeneration, unspecified cervical region: Secondary | ICD-10-CM | POA: Insufficient documentation

## 2021-03-30 DIAGNOSIS — M5412 Radiculopathy, cervical region: Secondary | ICD-10-CM | POA: Insufficient documentation

## 2021-03-30 DIAGNOSIS — R221 Localized swelling, mass and lump, neck: Secondary | ICD-10-CM | POA: Insufficient documentation

## 2021-03-30 DIAGNOSIS — G894 Chronic pain syndrome: Secondary | ICD-10-CM | POA: Diagnosis present

## 2021-03-30 DIAGNOSIS — M47812 Spondylosis without myelopathy or radiculopathy, cervical region: Secondary | ICD-10-CM | POA: Insufficient documentation

## 2021-03-30 MED ORDER — METHOCARBAMOL 500 MG PO TABS: 500.0000 mg | ORAL_TABLET | Freq: Three times a day (TID) | ORAL | 2 refills | Status: DC | PRN

## 2021-03-30 NOTE — Progress Notes (Signed)
PROVIDER NOTE: Information contained herein reflects review and annotations entered in association with encounter. Interpretation of such information and data should be left to medically-trained personnel. Information provided to patient can be located elsewhere in the medical record under "Patient Instructions". Document created using STT-dictation technology, any transcriptional errors that may result from process are unintentional.    Patient: Fuentes Fuentes  Service Category: E/M  Provider: Gillis Santa, MD  DOB: [birth year]  DOS: 03/30/2021  Specialty: Interventional Pain Management  MRN: [id number]  Setting: Ambulatory outpatient  PCP: Fuentes Sizer, MD  Type: Established Patient    Referring Provider: Steele Sizer, MD  Location: Office  Delivery: Face-to-face     HPI  Ms. Fuentes Fuentes, a 75 y.o. year old female, is here today because of her Cervical facet joint syndrome [M47.812]. Fuentes Fuentes primary complain today is Headache Last encounter: My last encounter with her was on 02/11/2021. Pertinent problems: Fuentes Fuentes does not have any pertinent problems on file. Pain Assessment: Severity of Chronic pain is reported as a 3 /10. Location: Head Right/radiates up her face. Onset: More than a month ago. Quality: Aching,Pressure,Throbbing. Timing: Intermittent. Modifying factor(s): tylenol. Vitals:  height is '5\' 5"'  (1.651 m) and weight is 180 lb (81.6 kg). Her temperature is 96.9 F (36.1 C) (abnormal). Her blood pressure is 151/91 (abnormal) and her pulse is 72. Her respiration is 18 and oxygen saturation is 68% (abnormal).   Reason for encounter: follow-up evaluation    Patient follows up today for medication management.  Of note at her last clinic visit, she was referred to ENT for right sternocleidomastoid pain and swelling.  She was known to have a benign right parotid gland mass confirmed via biopsy.  She has been scheduled for a superficial parotidectomy on 05/05/2021 with Dr.  Richardson Fuentes.  Today I will refill her Robaxin.  She will follow-up with me after her surgery to assess her headache frequency and intensity.  ROS  Constitutional: Denies any fever or chills Gastrointestinal: No reported hemesis, hematochezia, vomiting, or acute GI distress Musculoskeletal: Right facial headaches, right sternocleidomastoid swelling Neurological: No reported episodes of acute onset apraxia, aphasia, dysarthria, agnosia, amnesia, paralysis, loss of coordination, or loss of consciousness  Medication Review  ALPRAZolam, Latanoprostene Bunod, Omega-3 Fatty Acids, aspirin, cholecalciferol, citalopram, clobetasol ointment, denosumab, fluticasone, hydrALAZINE, levocetirizine, levothyroxine, losartan-hydrochlorothiazide, methocarbamol, pantoprazole, pregabalin, simvastatin, timolol, and zolpidem  History Review  Allergy: Fuentes Fuentes is allergic to brimonidine tartrate, latanoprost, meloxicam, and raloxifene. Drug: Fuentes Fuentes  reports no history of drug use. Alcohol:  reports no history of alcohol use. Tobacco:  reports that she has never smoked. She has never used smokeless tobacco. Social: Fuentes Fuentes  reports that she has never smoked. She has never used smokeless tobacco. She reports that she does not drink alcohol and does not use drugs. Medical:  has a past medical history of Allergy, Anxiety, Arthritis, Benign parotid tumor, Chronic insomnia, Colon adenoma, Dyslipidemia, Dysphagia, Female stress incontinence, GERD (gastroesophageal reflux disease), Glaucoma, both eyes, Hyperlipidemia, Hypertension, Hypothyroidism, Menopause syndrome, Osteoporosis, Polyp of colon, Renal insufficiency, and Vitamin D deficiency. Surgical: Fuentes Fuentes  has a past surgical history that includes Ovarian cyst removal; Colonoscopy with propofol (N/A, 05/29/2015); Esophagogastroduodenoscopy (N/A, 05/29/2015); Appendectomy; Abdominal hysterectomy; Eye surgery; Photocoagulation with laser (Right, 11/21/2016);  Esophagogastroduodenoscopy (N/A, 09/14/2020); and Colonoscopy (N/A, 09/14/2020). Family: family history includes Aneurysm in her brother; COPD in her mother and sister; Cancer in her father; Dementia in her mother; Hypertension in her mother; Hypothyroidism in her daughter; Osteoporosis  in her mother.  Laboratory Chemistry Profile   Renal Lab Results  Component Value Date   BUN 15 10/19/2020   CREATININE 1.10 (H) 03/12/2021   LABCREA 15 (L) 10/19/2020   BCR 13 10/19/2020   GFRAA 56 (L) 10/19/2020   GFRNONAA 49 (L) 10/19/2020     Hepatic Lab Results  Component Value Date   AST 53 (H) 10/19/2020   ALT 54 (H) 10/19/2020   ALBUMIN 4.2 03/08/2017   ALKPHOS 50 03/08/2017     Electrolytes Lab Results  Component Value Date   NA 138 10/19/2020   K 4.5 10/19/2020   CL 104 10/19/2020   CALCIUM 9.9 10/19/2020     Bone Lab Results  Component Value Date   VD25OH 35 03/18/2020     Inflammation (CRP: Acute Phase) (ESR: Chronic Phase) No results found for: CRP, ESRSEDRATE, LATICACIDVEN     Note: Above Lab results reviewed.  Recent Imaging Review  [place] CORE BIOPSY (SALIVARY GLAND/PAROTID GLAND) INDICATION: 1.3 cm right parotid mass  EXAM: ULTRASOUND CORE BIOPSY RIGHT PAROTID MASS  MEDICATIONS: 1% LIDOCAINE LOCAL  ANESTHESIA/SEDATION: Moderate Sedation Time: None.  FLUOROSCOPY TIME:  Fluoroscopy Time: None.  COMPLICATIONS: None immediate.  PROCEDURE: Informed written consent was obtained from the patient after a thorough discussion of the procedural risks, benefits and alternatives. All questions were addressed. Maximal Sterile Barrier Technique was utilized including caps, mask, sterile gowns, sterile gloves, sterile drape, hand hygiene and skin antiseptic. A timeout was performed prior to the initiation of the procedure.  Previous imaging reviewed. Preliminary ultrasound performed. The right parotid lesion was localized and marked for biopsy.  Under sterile  conditions and local anesthesia, and 18 gauge core biopsy was advanced to the lesion. 3 18 gauge core biopsies obtained. Samples were intact and placed in formalin. Needle removed. Postprocedure imaging demonstrates no hemorrhage or hematoma. Patient tolerated the biopsy well.  IMPRESSION: Successful ultrasound right parotid mass 18 gauge core biopsy  Electronically Signed   By: Jerilynn Mages.  Shick M.D.   On: 03/23/2021 14:03 Note: Reviewed        Physical Exam  General appearance: Well nourished, well developed, and well hydrated. In no apparent acute distress Mental status: Alert, oriented x 3 (person, place, & time)       Respiratory: No evidence of acute respiratory distress Eyes: PERLA Vitals: BP (!) 151/91   Pulse 72   Temp (!) 96.9 F (36.1 C)   Resp 18   Ht '5\' 5"'  (1.651 m)   Wt 180 lb (81.6 kg)   LMP  (LMP Unknown)   SpO2 (!) 68%   BMI 29.95 kg/m  BMI: Estimated body mass index is 29.95 kg/m as calculated from the following:   Height as of this encounter: '5\' 5"'  (1.651 m).   Weight as of this encounter: 180 lb (81.6 kg). Ideal: Ideal body weight: 57 kg (125 lb 10.6 oz) Adjusted ideal body weight: 66.9 kg (147 lb 6.4 oz)  Assessment   Status Diagnosis  Controlled Controlled Controlled 1. Cervical facet joint syndrome   2. Cervicogenic migraine   3. Degeneration of cervical intervertebral disc   4. Cervical radiculitis   5. Neck swelling   6. Cervical radicular pain   7. Cervical radiculopathy at C6 (right)   8. Chronic pain syndrome       Plan of Care   Ms. Fuentes Fuentes has a current medication list which includes the following long-term medication(s): citalopram, fluticasone, hydralazine, levocetirizine, levothyroxine, losartan-hydrochlorothiazide, pantoprazole, pregabalin, simvastatin, and zolpidem.  Pharmacotherapy (Medications Ordered): Meds ordered this encounter  Medications  . methocarbamol (ROBAXIN) 500 MG tablet    Sig: Take 1 tablet (500 mg total)  by mouth every 8 (eight) hours as needed for muscle spasms.    Dispense:  60 tablet    Refill:  2    Do not place this medication, or any other prescription from our practice, on "Automatic Refill". Patient may have prescription filled one day early if pharmacy is closed on scheduled refill date.   Follow-up plan:   Return in about 14 weeks (around 07/06/2021) for Medication Management, in person.     C7-T1 ESI, not effective, right C4, C5, C6 cervical facet medial branch nerve block #1 01/25/2021:100% pain relief for 1 day,  consider botox      Recent Visits Date Type Provider Dept  02/11/21 Office Visit Fuentes Santa, MD [facility]  01/25/21 Procedure visit Fuentes Santa, MD [facility]  01/14/21 Telemedicine Fuentes Santa, MD [facility]  Showing recent visits within past 90 days and meeting all other requirements Today's Visits Date Type Provider Dept  03/30/21 Office Visit Fuentes Santa, MD [facility]  Showing today's visits and meeting all other requirements Future Appointments No visits were found meeting these conditions. Showing future appointments within next 90 days and meeting all other requirements  I discussed the assessment and treatment plan with the patient. The patient was provided an opportunity to ask questions and all were answered. The patient agreed with the plan and demonstrated an understanding of the instructions.  Patient advised to call back or seek an in-person evaluation if the symptoms or condition worsens.  Duration of encounter: 60mnutes.  Note by: BGillis Santa MD Date: 03/30/2021; Time: 12:45 PM

## 2021-03-30 NOTE — Progress Notes (Signed)
Safety precautions to be maintained throughout the outpatient stay will include: orient to surroundings, keep bed in low position, maintain call bell within reach at all times, provide assistance with transfer out of bed and ambulation.  

## 2021-04-05 ENCOUNTER — Other Ambulatory Visit: Payer: Self-pay | Admitting: Family Medicine

## 2021-04-05 DIAGNOSIS — E785 Hyperlipidemia, unspecified: Secondary | ICD-10-CM

## 2021-04-15 ENCOUNTER — Other Ambulatory Visit: Payer: Self-pay

## 2021-04-15 ENCOUNTER — Ambulatory Visit (INDEPENDENT_AMBULATORY_CARE_PROVIDER_SITE_OTHER): Payer: PPO

## 2021-04-15 DIAGNOSIS — E785 Hyperlipidemia, unspecified: Secondary | ICD-10-CM | POA: Diagnosis not present

## 2021-04-15 DIAGNOSIS — R739 Hyperglycemia, unspecified: Secondary | ICD-10-CM | POA: Diagnosis not present

## 2021-04-15 DIAGNOSIS — E039 Hypothyroidism, unspecified: Secondary | ICD-10-CM | POA: Diagnosis not present

## 2021-04-15 DIAGNOSIS — I10 Essential (primary) hypertension: Secondary | ICD-10-CM | POA: Diagnosis not present

## 2021-04-15 DIAGNOSIS — E538 Deficiency of other specified B group vitamins: Secondary | ICD-10-CM | POA: Diagnosis not present

## 2021-04-15 MED ORDER — CYANOCOBALAMIN 1000 MCG/ML IJ SOLN
1000.0000 ug | Freq: Once | INTRAMUSCULAR | Status: AC
  Administered 2021-04-15: 1000 ug via INTRAMUSCULAR

## 2021-04-16 LAB — CBC WITH DIFFERENTIAL/PLATELET
Absolute Monocytes: 778 {cells}/uL (ref 200–950)
Basophils Absolute: 103 {cells}/uL (ref 0–200)
Basophils Relative: 1.9 %
Eosinophils Absolute: 151 {cells}/uL (ref 15–500)
Eosinophils Relative: 2.8 %
HCT: 35.8 % (ref 35.0–45.0)
Hemoglobin: 11.3 g/dL — ABNORMAL LOW (ref 11.7–15.5)
Lymphs Abs: 1469 {cells}/uL (ref 850–3900)
MCH: 26.6 pg — ABNORMAL LOW (ref 27.0–33.0)
MCHC: 31.6 g/dL — ABNORMAL LOW (ref 32.0–36.0)
MCV: 84.2 fL (ref 80.0–100.0)
MPV: 11.7 fL (ref 7.5–12.5)
Monocytes Relative: 14.4 %
Neutro Abs: 2900 {cells}/uL (ref 1500–7800)
Neutrophils Relative %: 53.7 %
Platelets: 294 10*3/uL (ref 140–400)
RBC: 4.25 Million/uL (ref 3.80–5.10)
RDW: 13.9 % (ref 11.0–15.0)
Total Lymphocyte: 27.2 %
WBC: 5.4 10*3/uL (ref 3.8–10.8)

## 2021-04-16 LAB — HEMOGLOBIN A1C
Hgb A1c MFr Bld: 6.2 %{Hb} — ABNORMAL HIGH
Mean Plasma Glucose: 131 mg/dL
eAG (mmol/L): 7.3 mmol/L

## 2021-04-16 LAB — LIPID PANEL
Cholesterol: 146 mg/dL
HDL: 57 mg/dL
LDL Cholesterol (Calc): 69 mg/dL
Non-HDL Cholesterol (Calc): 89 mg/dL
Total CHOL/HDL Ratio: 2.6 (calc)
Triglycerides: 118 mg/dL

## 2021-04-16 LAB — COMPLETE METABOLIC PANEL WITHOUT GFR
AG Ratio: 2.1 (calc) (ref 1.0–2.5)
ALT: 33 U/L — ABNORMAL HIGH (ref 6–29)
AST: 34 U/L (ref 10–35)
Albumin: 4.8 g/dL (ref 3.6–5.1)
Alkaline phosphatase (APISO): 48 U/L (ref 37–153)
BUN/Creatinine Ratio: 13 (calc) (ref 6–22)
BUN: 18 mg/dL (ref 7–25)
CO2: 29 mmol/L (ref 20–32)
Calcium: 9.9 mg/dL (ref 8.6–10.4)
Chloride: 102 mmol/L (ref 98–110)
Creat: 1.39 mg/dL — ABNORMAL HIGH (ref 0.60–0.93)
GFR, Est African American: 44 mL/min/{1.73_m2} — ABNORMAL LOW
GFR, Est Non African American: 38 mL/min/{1.73_m2} — ABNORMAL LOW
Globulin: 2.3 g/dL (ref 1.9–3.7)
Glucose, Bld: 130 mg/dL — ABNORMAL HIGH (ref 65–99)
Potassium: 4.4 mmol/L (ref 3.5–5.3)
Sodium: 139 mmol/L (ref 135–146)
Total Bilirubin: 0.4 mg/dL (ref 0.2–1.2)
Total Protein: 7.1 g/dL (ref 6.1–8.1)

## 2021-04-16 LAB — TSH: TSH: 0.8 m[IU]/L (ref 0.40–4.50)

## 2021-04-19 DIAGNOSIS — D37030 Neoplasm of uncertain behavior of the parotid salivary glands: Secondary | ICD-10-CM | POA: Diagnosis not present

## 2021-04-30 ENCOUNTER — Encounter
Admission: RE | Admit: 2021-04-30 | Discharge: 2021-04-30 | Disposition: A | Discharge status: Home / Self Care | Payer: PPO | Source: Ambulatory Visit | Attending: Otolaryngology | Admitting: Otolaryngology

## 2021-04-30 ENCOUNTER — Other Ambulatory Visit: Payer: Self-pay

## 2021-04-30 HISTORY — DX: Headache, unspecified: R51.9

## 2021-04-30 NOTE — Patient Instructions (Signed)
Your procedure is scheduled on:  Wednesday 05/05/21.  Report to THE FIRST FLOOR REGISTRATION DESK IN THE MEDICAL MALL ON THE MORNING OF SURGERY FIRST, THEN YOU WILL CHECK IN AT THE SURGERY INFORMATION DESK LOCATED OUTSIDE THE SAME DAY SURGERY DEPARTMENT LOCATED ON 2ND FLOOR MEDICAL MALL ENTRANCE.  To find out your arrival time please call [phone] between 1PM - 3PM on Tuesday 05/04/21.   Remember: Instructions that are not followed completely may result in serious medical risk, up to and including death, or upon the discretion of your surgeon and anesthesiologist your surgery may need to be rescheduled.     __X__ 1. Do not eat food after midnight the night before your procedure.                 No gum chewing or hard candies. You may drink clear liquids up to 2 hours                 before you are scheduled to arrive for your surgery- DO NOT drink clear                 liquids within 2 hours of the start of your surgery.                 Clear Liquids include:  water, apple juice without pulp, clear carbohydrate                 drink such as Clearfast or Gatorade, Black Coffee or Tea (Do not add                 milk or creamer to coffee or tea).  __X__2.  On the morning of surgery brush your teeth with toothpaste and water, you may rinse your mouth with mouthwash if you wish.  Do not swallow any toothpaste or mouthwash.    __X__ 3.  No Alcohol for 24 hours before or after surgery.  __X__ 4.  Do Not Smoke or use e-cigarettes For 24 Hours Prior to Your Surgery.                 Do not use any chewable tobacco products for at least 6 hours prior to                 surgery.  __X__5.  Notify your doctor if there is any change in your medical condition      (cold, fever, infections).      Do NOT wear jewelry, make-up, hairpins, clips or nail polish. Do NOT wear lotions, powders, or perfumes.  Do NOT shave 48 hours prior to surgery. Men may shave face and neck. Do NOT bring valuables to  the hospital.     [facility] is not responsible for any belongings or valuables.   Contacts, dentures/partials or body piercings may not be worn into surgery. Bring a case for your contacts, glasses or hearing aids, a denture cup will be supplied.     Patients discharged the day of surgery will not be allowed to drive home.     __X__ Take these medicines the morning of surgery with A SIP OF WATER:     1. citalopram (CELEXA)  2. levothyroxine (SYNTHROID)  3. pantoprazole (PROTONIX)     __X__ Use CHG Soap as directed.    __X__ Stop Blood Thinners Aspirin. You reported stopping this on May 6th in preparation for surgery.   __X__ Stop Anti-inflammatories 7 days before surgery such as Advil, Ibuprofen,  Motrin, BC or Goodies Powder, Naprosyn, Naproxen, Aleve, Aspirin, Meloxicam. May take Tylenol if needed for pain or discomfort.   __X__Do not start taking any new herbal supplements or vitamins prior to your procedure.  __X__ Stop the following herbal supplements or vitamins:  Omega-3 Fatty Acids (FISH OIL)  Turmeric 500 MG                                                                                                                                                                                                                                               Wear comfortable clothing (specific to your surgery type) to the hospital.  Plan for stool softeners for home use; pain medications have a tendency to cause constipation. You can also help prevent constipation by eating foods high in fiber such as fruits and vegetables and drinking plenty of fluids as your diet allows.  After surgery, you can prevent lung complications by doing breathing exercises.Take deep breaths and cough every 1-2 hours. Your doctor may order a device called an Incentive Spirometer to help you take deep breaths.  Please call the [place] Department at [phone] if you have any questions  about these instructions.

## 2021-05-03 ENCOUNTER — Encounter
Admission: RE | Admit: 2021-05-03 | Discharge: 2021-05-03 | Disposition: A | Discharge status: Home / Self Care | Payer: PPO | Source: Ambulatory Visit | Attending: Otolaryngology | Admitting: Otolaryngology

## 2021-05-03 ENCOUNTER — Other Ambulatory Visit: Payer: PPO

## 2021-05-03 ENCOUNTER — Other Ambulatory Visit: Payer: Self-pay

## 2021-05-03 DIAGNOSIS — Z01810 Encounter for preprocedural cardiovascular examination: Secondary | ICD-10-CM | POA: Diagnosis present

## 2021-05-03 DIAGNOSIS — I10 Essential (primary) hypertension: Secondary | ICD-10-CM | POA: Diagnosis not present

## 2021-05-05 ENCOUNTER — Ambulatory Visit: Payer: PPO

## 2021-05-05 ENCOUNTER — Ambulatory Visit
Admission: RE | Admit: 2021-05-05 | Discharge: 2021-05-05 | Disposition: A | Discharge status: Home / Self Care | Payer: PPO | Source: Ambulatory Visit | Attending: Otolaryngology | Admitting: Otolaryngology

## 2021-05-05 ENCOUNTER — Encounter: Payer: Self-pay | Admitting: Otolaryngology

## 2021-05-05 ENCOUNTER — Encounter
Admission: RE | Disposition: A | Discharge status: Home / Self Care | Payer: Self-pay | Source: Ambulatory Visit | Attending: Otolaryngology

## 2021-05-05 ENCOUNTER — Other Ambulatory Visit: Payer: Self-pay

## 2021-05-05 DIAGNOSIS — Z888 Allergy status to other drugs, medicaments and biological substances status: Secondary | ICD-10-CM | POA: Diagnosis not present

## 2021-05-05 DIAGNOSIS — R229 Localized swelling, mass and lump, unspecified: Secondary | ICD-10-CM | POA: Diagnosis present

## 2021-05-05 DIAGNOSIS — R221 Localized swelling, mass and lump, neck: Secondary | ICD-10-CM | POA: Diagnosis not present

## 2021-05-05 DIAGNOSIS — Z79899 Other long term (current) drug therapy: Secondary | ICD-10-CM | POA: Diagnosis not present

## 2021-05-05 DIAGNOSIS — Z79890 Hormone replacement therapy (postmenopausal): Secondary | ICD-10-CM | POA: Insufficient documentation

## 2021-05-05 DIAGNOSIS — Z886 Allergy status to analgesic agent status: Secondary | ICD-10-CM | POA: Diagnosis not present

## 2021-05-05 DIAGNOSIS — D110 Benign neoplasm of parotid gland: Secondary | ICD-10-CM | POA: Diagnosis not present

## 2021-05-05 DIAGNOSIS — E039 Hypothyroidism, unspecified: Secondary | ICD-10-CM | POA: Diagnosis not present

## 2021-05-05 DIAGNOSIS — D119 Benign neoplasm of major salivary gland, unspecified: Secondary | ICD-10-CM | POA: Diagnosis not present

## 2021-05-05 HISTORY — PX: PAROTIDECTOMY: SHX2163

## 2021-05-05 SURGERY — PAROTIDECTOMY
Anesthesia: General | Laterality: Right

## 2021-05-05 MED ORDER — ONDANSETRON HCL 4 MG/2ML IJ SOLN: 4.0000 mg | Freq: Once | INTRAMUSCULAR | Status: DC | PRN

## 2021-05-05 MED ORDER — GLYCOPYRROLATE 0.2 MG/ML IJ SOLN
INTRAMUSCULAR | Status: AC
  Filled 2021-05-05: qty 1

## 2021-05-05 MED ORDER — HYDROCODONE-ACETAMINOPHEN 5-325 MG PO TABS: 1.0000 | ORAL_TABLET | Freq: Four times a day (QID) | ORAL | 0 refills | Status: DC | PRN

## 2021-05-05 MED ORDER — LIDOCAINE HCL (PF) 2 % IJ SOLN
INTRAMUSCULAR | Status: AC
  Filled 2021-05-05: qty 5

## 2021-05-05 MED ORDER — EPHEDRINE SULFATE 50 MG/ML IJ SOLN
INTRAMUSCULAR | Status: DC | PRN
  Administered 2021-05-05 (×2): 10 mg via INTRAVENOUS
  Administered 2021-05-05: 15 mg via INTRAVENOUS
  Administered 2021-05-05: 10 mg via INTRAVENOUS
  Administered 2021-05-05: 5 mg via INTRAVENOUS

## 2021-05-05 MED ORDER — LACTATED RINGERS IV SOLN: INTRAVENOUS | Status: DC | PRN

## 2021-05-05 MED ORDER — BACITRACIN-NEOMYCIN-POLYMYXIN 400-5-5000 EX OINT
TOPICAL_OINTMENT | CUTANEOUS | Status: AC
  Filled 2021-05-05: qty 1

## 2021-05-05 MED ORDER — SODIUM CHLORIDE (PF) 0.9 % IJ SOLN
INTRAMUSCULAR | Status: AC
  Filled 2021-05-05: qty 20

## 2021-05-05 MED ORDER — REMIFENTANIL HCL 1 MG IV SOLR
INTRAVENOUS | Status: DC | PRN
  Administered 2021-05-05: .1 ug/kg/min via INTRAVENOUS

## 2021-05-05 MED ORDER — LIDOCAINE HCL (CARDIAC) PF 100 MG/5ML IV SOSY
PREFILLED_SYRINGE | INTRAVENOUS | Status: DC | PRN
  Administered 2021-05-05: 40 mg via INTRAVENOUS

## 2021-05-05 MED ORDER — SUCCINYLCHOLINE CHLORIDE 200 MG/10ML IV SOSY
PREFILLED_SYRINGE | INTRAVENOUS | Status: AC
  Filled 2021-05-05: qty 10

## 2021-05-05 MED ORDER — GLYCOPYRROLATE 0.2 MG/ML IJ SOLN
INTRAMUSCULAR | Status: DC | PRN
  Administered 2021-05-05: .2 mg via INTRAVENOUS

## 2021-05-05 MED ORDER — ORAL CARE MOUTH RINSE: 15.0000 mL | Freq: Once | OROMUCOSAL | Status: AC

## 2021-05-05 MED ORDER — BACITRACIN ZINC 500 UNIT/GM EX OINT
TOPICAL_OINTMENT | CUTANEOUS | Status: AC
  Filled 2021-05-05: qty 28.35

## 2021-05-05 MED ORDER — LIDOCAINE-EPINEPHRINE 1 %-1:100000 IJ SOLN
INTRAMUSCULAR | Status: AC
  Filled 2021-05-05: qty 1

## 2021-05-05 MED ORDER — REMIFENTANIL HCL 1 MG IV SOLR
INTRAVENOUS | Status: AC
  Filled 2021-05-05: qty 1000

## 2021-05-05 MED ORDER — PROPOFOL 10 MG/ML IV BOLUS
INTRAVENOUS | Status: DC | PRN
  Administered 2021-05-05: 150 mg via INTRAVENOUS
  Administered 2021-05-05: 20 mg via INTRAVENOUS
  Administered 2021-05-05: 30 mg via INTRAVENOUS
  Administered 2021-05-05: 50 mg via INTRAVENOUS

## 2021-05-05 MED ORDER — FENTANYL CITRATE (PF) 100 MCG/2ML IJ SOLN
INTRAMUSCULAR | Status: DC | PRN
  Administered 2021-05-05 (×2): 50 ug via INTRAVENOUS

## 2021-05-05 MED ORDER — ONDANSETRON HCL 4 MG/2ML IJ SOLN
INTRAMUSCULAR | Status: DC | PRN
  Administered 2021-05-05: 4 mg via INTRAVENOUS

## 2021-05-05 MED ORDER — ONDANSETRON HCL 4 MG/2ML IJ SOLN
INTRAMUSCULAR | Status: AC
  Filled 2021-05-05: qty 2

## 2021-05-05 MED ORDER — SODIUM CHLORIDE 0.9 % IV SOLN
INTRAVENOUS | Status: DC | PRN
  Administered 2021-05-05: 20 ug/min via INTRAVENOUS

## 2021-05-05 MED ORDER — EPINEPHRINE PF 1 MG/ML IJ SOLN
INTRAMUSCULAR | Status: AC
  Filled 2021-05-05: qty 1

## 2021-05-05 MED ORDER — CHLORHEXIDINE GLUCONATE 0.12 % MT SOLN
OROMUCOSAL | Status: AC
  Administered 2021-05-05: 15 mL via OROMUCOSAL
  Filled 2021-05-05: qty 15

## 2021-05-05 MED ORDER — DEXAMETHASONE SODIUM PHOSPHATE 10 MG/ML IJ SOLN
INTRAMUSCULAR | Status: AC
  Filled 2021-05-05: qty 1

## 2021-05-05 MED ORDER — FENTANYL CITRATE (PF) 100 MCG/2ML IJ SOLN
INTRAMUSCULAR | Status: AC
  Filled 2021-05-05: qty 2

## 2021-05-05 MED ORDER — CHLORHEXIDINE GLUCONATE 0.12 % MT SOLN: 15.0000 mL | Freq: Once | OROMUCOSAL | Status: AC

## 2021-05-05 MED ORDER — SEVOFLURANE IN SOLN
RESPIRATORY_TRACT | Status: AC
  Filled 2021-05-05: qty 250

## 2021-05-05 MED ORDER — ONDANSETRON 4 MG PO TBDP: 4.0000 mg | ORAL_TABLET | Freq: Three times a day (TID) | ORAL | 0 refills | Status: DC | PRN

## 2021-05-05 MED ORDER — PROPOFOL 10 MG/ML IV BOLUS
INTRAVENOUS | Status: AC
  Filled 2021-05-05: qty 40

## 2021-05-05 MED ORDER — MIDAZOLAM HCL 2 MG/2ML IJ SOLN
INTRAMUSCULAR | Status: AC
  Filled 2021-05-05: qty 2

## 2021-05-05 MED ORDER — LIDOCAINE HCL (PF) 1 % IJ SOLN
INTRAMUSCULAR | Status: AC
  Filled 2021-05-05: qty 30

## 2021-05-05 MED ORDER — MIDAZOLAM HCL 2 MG/2ML IJ SOLN
INTRAMUSCULAR | Status: DC | PRN
  Administered 2021-05-05: 2 mg via INTRAVENOUS

## 2021-05-05 MED ORDER — DEXAMETHASONE SODIUM PHOSPHATE 10 MG/ML IJ SOLN
INTRAMUSCULAR | Status: DC | PRN
  Administered 2021-05-05: 10 mg via INTRAVENOUS

## 2021-05-05 MED ORDER — SUCCINYLCHOLINE CHLORIDE 20 MG/ML IJ SOLN
INTRAMUSCULAR | Status: DC | PRN
  Administered 2021-05-05: 120 mg via INTRAVENOUS

## 2021-05-05 MED ORDER — FENTANYL CITRATE (PF) 100 MCG/2ML IJ SOLN: 25.0000 ug | INTRAMUSCULAR | Status: DC | PRN

## 2021-05-05 MED ORDER — LIDOCAINE-EPINEPHRINE (PF) 1 %-1:200000 IJ SOLN
INTRAMUSCULAR | Status: DC | PRN
  Administered 2021-05-05: 5 mL

## 2021-05-05 MED ORDER — LACTATED RINGERS IV SOLN: INTRAVENOUS | Status: DC

## 2021-05-05 MED ORDER — PHENYLEPHRINE HCL (PRESSORS) 10 MG/ML IV SOLN
INTRAVENOUS | Status: AC
  Filled 2021-05-05: qty 1

## 2021-05-05 SURGICAL SUPPLY — 40 items
ADH LQ OCL WTPRF AMP STRL LF (MISCELLANEOUS) ×1
ADHESIVE MASTISOL STRL (MISCELLANEOUS) ×2
BLADE SURG 15 STRL LF DISP TIS (BLADE) ×1
BLADE SURG 15 STRL SS (BLADE) ×2
BULB RESERV EVAC DRAIN JP 100C (MISCELLANEOUS) ×2
CORD BIP STRL DISP 12FT (MISCELLANEOUS) ×2
COTTON BALL STRL MEDIUM (GAUZE/BANDAGES/DRESSINGS) ×2
COVER WAND RF STERILE (DRAPES) ×2
DRAIN JP 10F RND SILICONE (MISCELLANEOUS) ×2
DRAPE MAG INST 16X20 L/F (DRAPES) ×2
DRAPE SURG 17X11 SM STRL (DRAPES) ×4
DRSG TEGADERM 2-3/8X2-3/4 SM (GAUZE/BANDAGES/DRESSINGS) ×2
DRSG TEGADERM 4X4.75 (GAUZE/BANDAGES/DRESSINGS) ×2
DRSG TELFA 4X3 1S NADH ST (GAUZE/BANDAGES/DRESSINGS) ×2
ELECT EMG 20MM DUAL (MISCELLANEOUS) ×4
ELECT NEEDLE 20X.3 GREEN (MISCELLANEOUS) ×2
ELECT REM PT RETURN 9FT ADLT (ELECTROSURGICAL) ×2
FORCEPS JEWEL BIP 4-3/4 STR (INSTRUMENTS) ×2
GAUZE 4X4 16PLY RFD (DISPOSABLE) ×2
GAUZE SPONGE 4X4 12PLY STRL (GAUZE/BANDAGES/DRESSINGS) ×4
GLOVE SURG ENC MOIS LTX SZ7.5 (GLOVE) ×12
GOWN STRL REUS W/ TWL LRG LVL3 (GOWN DISPOSABLE) ×3
GOWN STRL REUS W/TWL LRG LVL3 (GOWN DISPOSABLE) ×6
HOOK STAY BLUNT/RETRACTOR 5M (MISCELLANEOUS) ×2
KIT TURNOVER KIT A (KITS) ×2
LABEL OR SOLS (LABEL) ×2
MANIFOLD NEPTUNE II (INSTRUMENTS) ×2
MARKER SKIN DUAL TIP RULER LAB (MISCELLANEOUS) ×2
PACK HEAD/NECK (MISCELLANEOUS) ×2
PROBE MONO 100X0.75 ELECT 1.9M (MISCELLANEOUS) ×2
SHEARS HARMONIC 9CM CVD (BLADE) ×2
SOL PREP POV-IOD 4OZ 10% (MISCELLANEOUS) ×4
SPONGE KITTNER 5P (MISCELLANEOUS) ×4
SUT ETHILON 3-0 (SUTURE) ×2
SUT PROLENE 5 0 PS 3 (SUTURE) ×2
SUT SILK 2 0 (SUTURE) ×4
SUT SILK 2-0 18XBRD TIE 12 (SUTURE) ×2
SUT SILK 4 0 (SUTURE) ×4
SUT SILK 4-0 18XBRD TIE 12 (SUTURE) ×2
SUT VIC AB 4-0 RB1 18 (SUTURE) ×2

## 2021-05-05 NOTE — Discharge Instructions (Signed)
AMBULATORY SURGERY  °DISCHARGE INSTRUCTIONS ° ° °1) The drugs that you were given will stay in your system until tomorrow so for the next 24 hours you should not: ° °A) Drive an automobile °B) Make any legal decisions °C) Drink any alcoholic beverage ° ° °2) You may resume regular meals tomorrow.  Today it is better to start with liquids and gradually work up to solid foods. ° °You may eat anything you prefer, but it is better to start with liquids, then soup and crackers, and gradually work up to solid foods. ° ° °3) Please notify your doctor immediately if you have any unusual bleeding, trouble breathing, redness and pain at the surgery site, drainage, fever, or pain not relieved by medication. ° ° ° °4) Additional Instructions: ° ° ° ° ° ° ° °Please contact your physician with any problems or Same Day Surgery at [phone], Monday through Friday 6 am to 4 pm, or [place] [facility] number at [phone]. °

## 2021-05-05 NOTE — H&P (Signed)
History and physical reviewed and will be scanned in later. No change in medical status reported by the patient or family, appears stable for surgery. All questions regarding the procedure answered, and patient (or family if a child) expressed understanding of the procedure. ? ?Dawn Fuentes S [name] ?[email or web address]@ ?

## 2021-05-05 NOTE — Transfer of Care (Signed)
Immediate Anesthesia Transfer of Care Note  Patient: Dawn Fuentes  Procedure(s) Performed: SUPERFICIAL PAROTIDECTOMY (Right )  Patient Location: PACU  Anesthesia Type:General  Level of Consciousness: drowsy and patient cooperative  Airway & Oxygen Therapy: Patient Spontanous Breathing and Patient connected to face mask oxygen  Post-op Assessment: Report given to RN and Post -op Vital signs reviewed and stable  Post vital signs: Reviewed and stable  Last Vitals:  Vitals Value Taken Time  BP 120/73 05/05/21 1107  Temp    Pulse 97 05/05/21 1109  Resp 15 05/05/21 1109  SpO2 95 % 05/05/21 1109  Vitals shown include unvalidated device data.  Last Pain:  Vitals:   05/05/21 0737  TempSrc: Temporal  PainSc: 0-No pain         Complications: No complications documented.

## 2021-05-05 NOTE — Anesthesia Preprocedure Evaluation (Signed)
Anesthesia Evaluation  Patient identified by MRN, date of birth, ID band Patient awake    Reviewed: Allergy & Precautions, H&P , NPO status , Patient's Chart, lab work & pertinent test results, reviewed documented beta blocker date and time   Airway Mallampati: II  TM Distance: >3 FB Neck ROM: full    Dental  (+) Teeth Intact   Pulmonary neg pulmonary ROS,    Pulmonary exam normal        Cardiovascular Exercise Tolerance: Good hypertension, On Medications negative cardio ROS Normal cardiovascular exam Rhythm:regular Rate:Normal     Neuro/Psych  Headaches, Anxiety  Neuromuscular disease negative psych ROS   GI/Hepatic Neg liver ROS, GERD  Medicated,  Endo/Other  Hypothyroidism   Renal/GU Renal disease  negative genitourinary   Musculoskeletal   Abdominal   Peds  Hematology negative hematology ROS (+)   Anesthesia Other Findings Past Medical History: No date: Allergy No date: Anxiety     Comment:  ER visit in Sept 2017 for anxiety No date: Arthritis No date: Benign parotid tumor No date: Chronic insomnia No date: Colon adenoma No date: Dyslipidemia No date: Dysphagia No date: Female stress incontinence No date: GERD (gastroesophageal reflux disease) No date: Glaucoma, both eyes     [facility] No [facility]: Headache No date: Hyperlipidemia No date: Hypertension     Comment:  controlled on meds No date: Hypothyroidism No date: Menopause syndrome No date: Osteoporosis No date: Polyp of colon No date: Renal insufficiency     Comment:  mild- keeping an eye on No date: Vitamin D deficiency Past Surgical History: No date: ABDOMINAL HYSTERECTOMY     Comment:  21 years ago No date: APPENDECTOMY 09/14/2020: COLONOSCOPY; N/A     Comment:  Procedure: COLONOSCOPY;  Surgeon: Lesly Rubenstein,               MD;  Location: [facility] ENDOSCOPY;  Service: Endoscopy;                 Laterality: N/A; 05/29/2015: COLONOSCOPY WITH PROPOFOL; N/A     Comment:  Procedure: COLONOSCOPY WITH PROPOFOL;  Surgeon: Hulen Luster, MD;  Location: [facility] ENDOSCOPY;  Service:               Gastroenterology;  Laterality: N/A; 05/29/2015: ESOPHAGOGASTRODUODENOSCOPY; N/A     Comment:  Procedure: ESOPHAGOGASTRODUODENOSCOPY (EGD);  Surgeon:               Hulen Luster, MD;  Location: [facility] ENDOSCOPY;  Service:               Gastroenterology;  Laterality: N/A; 09/14/2020: ESOPHAGOGASTRODUODENOSCOPY; N/A     Comment:  Procedure: ESOPHAGOGASTRODUODENOSCOPY (EGD);  Surgeon:               Lesly Rubenstein, MD;  Location: [facility] ENDOSCOPY;                Service: Endoscopy;  Laterality: N/A; No date: EYE SURGERY     Comment:  eye lids No date: OVARIAN CYST REMOVAL     Comment:  needed transfusion 11/21/2016: PHOTOCOAGULATION WITH LASER; Right     Comment:  Procedure: PHOTOCOAGULATION WITH LASER;  Surgeon: Ronnell Freshwater, MD;  Location: [place];  Service: Ophthalmology;  Laterality: Right;  A micropulse              probe was applied to each hemilimbus with the following               settings: 2072mW, 31.3% duty cycle, 90 seconds x 3               applications.  No date: TONSILLECTOMY BMI    Body Mass Index: 29.61 kg/m     Reproductive/Obstetrics negative OB ROS                             Anesthesia Physical Anesthesia Plan  ASA: II  Anesthesia Plan: General ETT   Post-op Pain Management:    Induction:   PONV Risk Score and Plan:   Airway Management Planned:   Additional Equipment:   Intra-op Plan:   Post-operative Plan:   Informed Consent: I have reviewed the patients History and Physical, chart, labs and discussed the procedure including the risks, benefits and alternatives for the proposed anesthesia with the patient or authorized representative who has indicated his/her understanding and  acceptance.     Dental Advisory Given  Plan Discussed with: CRNA  Anesthesia Plan Comments:         Anesthesia Quick Evaluation

## 2021-05-05 NOTE — Progress Notes (Signed)
Upon assessment, IV was dislodge with sheath/cather was outsid eof dressing.  Small hematoma on top of left hand

## 2021-05-05 NOTE — Op Note (Signed)
05/05/2021  11:00 AM    [name]  [id number]   Pre-Op Diagnosis:  Right parotid mass  Post-op Diagnosis: Same  Procedure:   Right Superficial Parotidectomy  Surgeon:  Riley Nearing  Assistant: Margaretha Sheffield  Anesthesia:  General endotracheal anesthesia  EBL:  Less than 15QM  Complications:  None  Findings: 2 cm mass in mid to upper right parotid gland  Procedure: The patient was taken to the Operating Room and placed in the supine position.  After induction of general endotracheal anesthesia, the patient was turned 90 degrees and placed on a shoulder roll. The skin was injected along the proposed incision with 1% lidocaine with epinephrine, 1:200,000. The facial nerve monitor electrodes were placed in the usual fashion at the lower lip and brow on the same side of the procedure. Proper functioning of the nerve monitor was assessed. The area was then prepped and draped in the usual sterile fashion.   A 15 blade was then used to incise the skin from just in front of the right tragus, curving below the earlobe, and curving into the neck along an upper neck crease. . The dissection was carried down to the subcutaneous tissues with the harmonic scalpel and through the platysma muscle, exposing the anterior belly of the sternocleidomastoid muscle. More superiorly the dissection proceeded along the tragal cartilage and down towards the mastoid tip. The dissection proceeded inferiorly to expose the digastric muscle. Next a skin flap was elevated just superficial to the fascia over the parotid gland, widely exposing the gland. Dissection then proceeded deeper, dissecting down to the region of the stylomastoid notch, dividing soft tissues with the Harmonic scalpel. The facial nerve was then identified at this point and confirmed with the nerve stimulator. Dissection then proceeded along the nerve anteriorly, identifying the pes and then dissecting along the superior and inferior branches of  the nerve, dividing parotid tissue above the nerve with the Harmonic scalpel. Intervening parotid tissue between branches was divided with the Harmonic scalpel. In this fashion the mas was dissected out with a cuff of normal parotid tissue, dissecting it away from the underlying facial nerve branches. It was abutting an upper middle nerve branch, but was easily dissected clear of it. Once the dissection had proceeded anterior to the mass of abnormal parotid, the gland was then divided anterior to the mass, avoiding injury to the dissected nerve branches,  The wound was irrigated with saline and hemostasis obtained. A #10 TLS drain was placed through a separate stab incision in the skin posterior and inferior to the incision in the neck, and secured with a 4-0 nylon suture. The subcutaneous tissues were then closed with 4-0 Vicryl suture in an interrupted fashion. The skin was closed with 5-0 Prolene suture in a running locked stitch. Bacitracin ointment was applied to the wound.   The patient was then returned to the anesthesiologist for awakening, and was taken to the Recovery Room in stable condition.  Disposition:   PACU then discharge home  Plan: To recovery room, then discharge home with drain in place. Follow-up in office tomorrow for drain removal.   Riley Nearing 05/05/2021 11:00 AM

## 2021-05-05 NOTE — Anesthesia Procedure Notes (Signed)
Procedure Name: Intubation Date/Time: 05/05/2021 8:35 AM Performed by: Jonna Clark, CRNA Pre-anesthesia Checklist: Patient identified, Patient being monitored, Timeout performed, Emergency Drugs available and Suction available Patient Re-evaluated:Patient Re-evaluated prior to induction Oxygen Delivery Method: Circle system utilized Preoxygenation: Pre-oxygenation with 100% oxygen Induction Type: IV induction Ventilation: Mask ventilation without difficulty Laryngoscope Size: Mac and 3 Grade View: Grade I Tube type: Oral Tube size: 7.0 mm Number of attempts: 1 Airway Equipment and Method: Stylet Placement Confirmation: ETT inserted through vocal cords under direct vision,  positive ETCO2 and breath sounds checked- equal and bilateral Secured at: 21 cm Tube secured with: Tape Dental Injury: Teeth and Oropharynx as per pre-operative assessment

## 2021-05-06 ENCOUNTER — Encounter: Payer: Self-pay | Admitting: Otolaryngology

## 2021-05-06 LAB — SURGICAL PATHOLOGY

## 2021-05-06 NOTE — Anesthesia Postprocedure Evaluation (Signed)
Anesthesia Post Note  Patient: Dawn Fuentes  Procedure(s) Performed: SUPERFICIAL PAROTIDECTOMY (Right )  Patient location during evaluation: PACU Anesthesia Type: General Level of consciousness: awake and alert Pain management: pain level controlled Vital Signs Assessment: post-procedure vital signs reviewed and stable Respiratory status: spontaneous breathing, nonlabored ventilation, respiratory function stable and patient connected to nasal cannula oxygen Cardiovascular status: blood pressure returned to baseline and stable Postop Assessment: no apparent nausea or vomiting Anesthetic complications: no   No complications documented.   Last Vitals:  Vitals:   05/05/21 1212 05/05/21 1225  BP: 138/72 134/77  Pulse: 87   Resp: 18 16  Temp:    SpO2: 100% 93%    Last Pain:  Vitals:   05/06/21 1144  TempSrc:   PainSc: 3                  Molli Barrows

## 2021-05-10 ENCOUNTER — Other Ambulatory Visit: Payer: PPO

## 2021-06-10 ENCOUNTER — Other Ambulatory Visit: Payer: Self-pay

## 2021-06-10 ENCOUNTER — Other Ambulatory Visit: Payer: Self-pay | Admitting: Family Medicine

## 2021-06-10 ENCOUNTER — Ambulatory Visit (INDEPENDENT_AMBULATORY_CARE_PROVIDER_SITE_OTHER): Payer: PPO

## 2021-06-10 DIAGNOSIS — E039 Hypothyroidism, unspecified: Secondary | ICD-10-CM

## 2021-06-10 DIAGNOSIS — E538 Deficiency of other specified B group vitamins: Secondary | ICD-10-CM

## 2021-06-10 NOTE — Telephone Encounter (Signed)
E status : receipt confirmed from pharmacy 06/10/21 at 2:45 pm

## 2021-06-21 IMAGING — MR MR CERVICAL SPINE W/O CM
5 series · 41 of 48 positions shown · non-contrast
Comparison: Cervical spine radiographs 06/24/2020

CLINICAL DATA: Cervical radiculopathy. Right-sided neck pain for 6
months traveling into the right temple/here region with headaches.
Arm pain.

EXAM:
MRI CERVICAL SPINE WITHOUT CONTRAST
TECHNIQUE: Multiplanar, multisequence MR imaging of the cervical spine was
performed. No intravenous contrast was administered.

[Series 2: T2 · sagittal · 3.0mm · 0.69mm/px · 8 of 13 slices shown (1 of 2)]
[im 1/13]
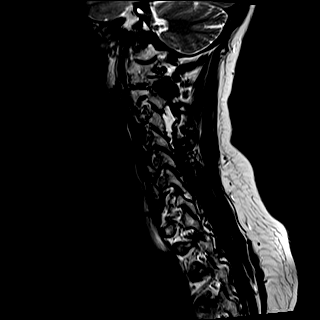
[im 2/13]
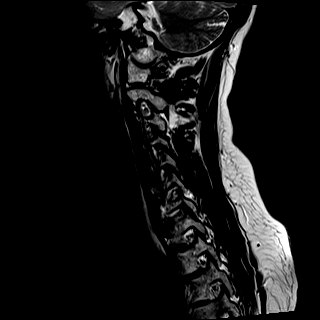
[im 4/13]
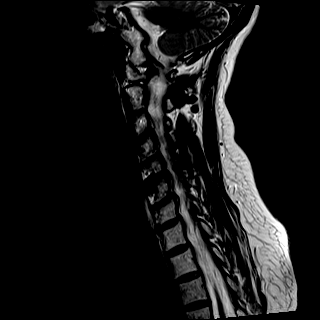
[im 6/13]
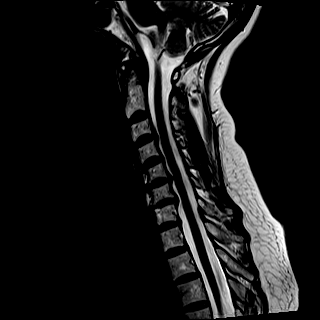
[im 7/13]
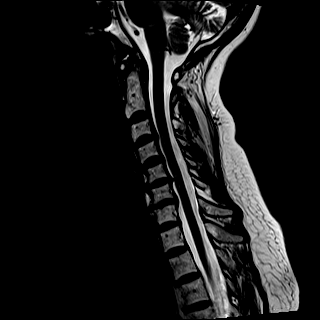
[im 9/13]
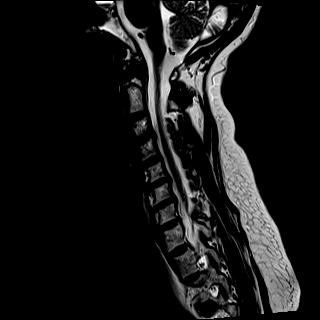
[im 11/13]
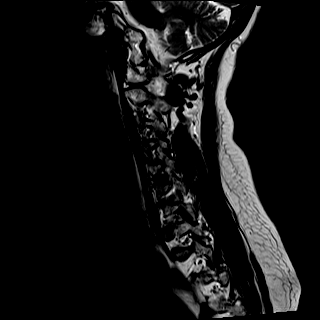
[im 13/13]
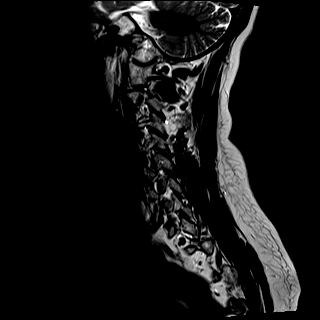

[Series 3: T1 · sagittal · 3.0mm · 0.86mm/px · 7 of 13 slices shown]
[im 1/13]
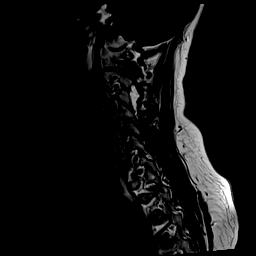
[im 3/13]
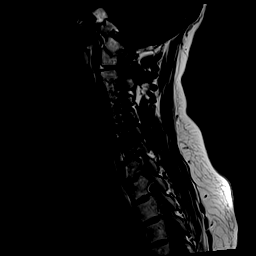
[im 5/13]
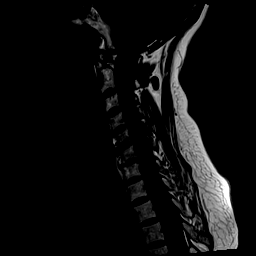
[im 7/13]
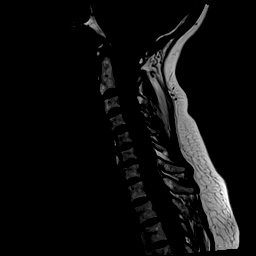
[im 9/13]
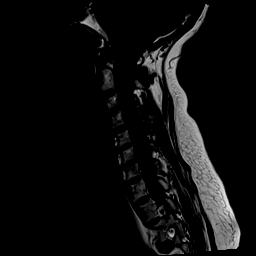
[im 11/13]
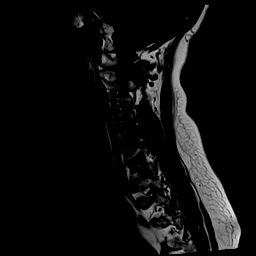
[im 13/13]
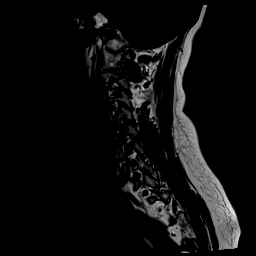

[Series 4: STIR · sagittal · 3.0mm · 0.69mm/px · 7 of 13 slices shown]
[im 1/13]
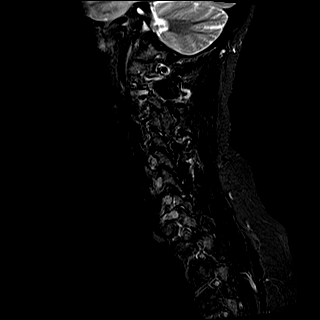
[im 3/13]
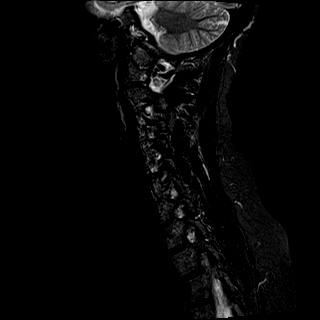
[im 5/13]
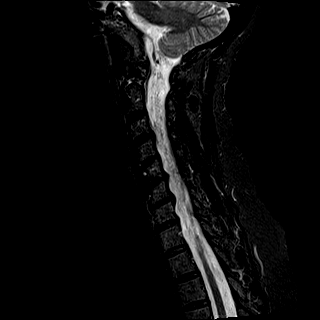
[im 7/13]
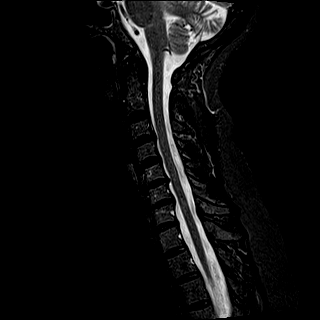
[im 9/13]
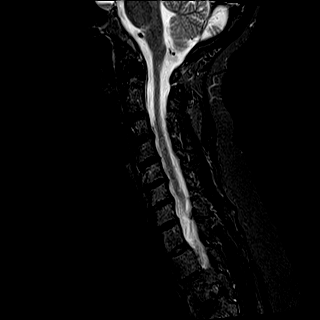
[im 11/13]
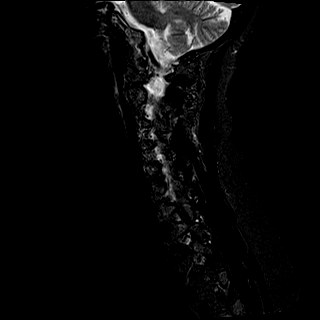
[im 13/13]
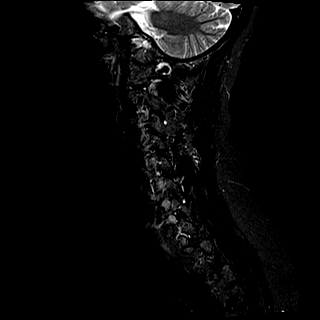

[Series 5: T2 · axial · 3.0mm · 0.62mm/px · z∈[-49,+41]mm · 11 of 25 slices shown (2 of 2)]
[im 1/25]
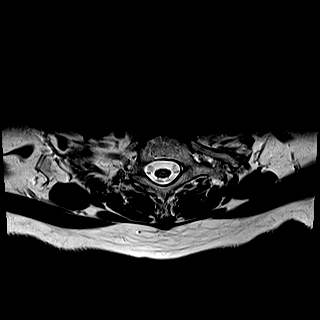
[im 3/25]
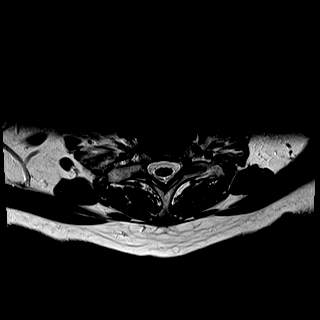
[im 5/25]
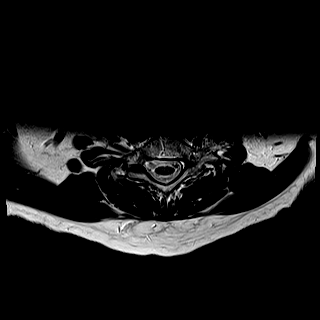
[im 7/25]
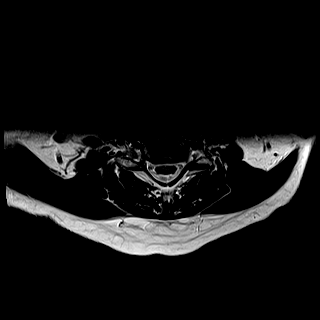
[im 9/25]
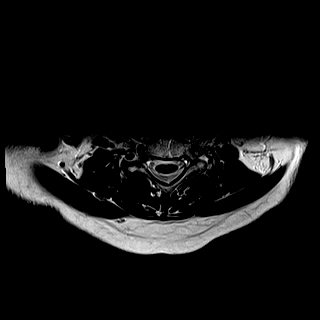
[im 11/25]
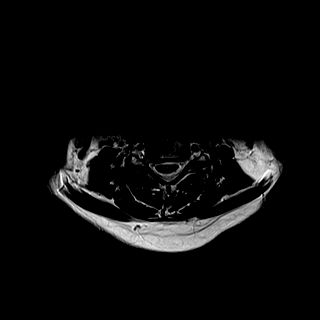
[im 13/25]
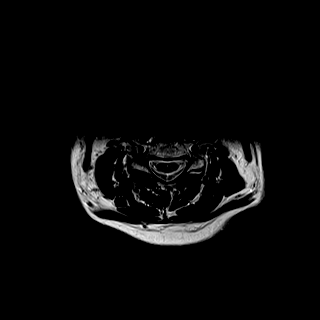
[im 15/25]
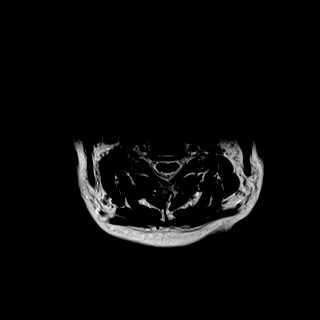
[im 17/25]
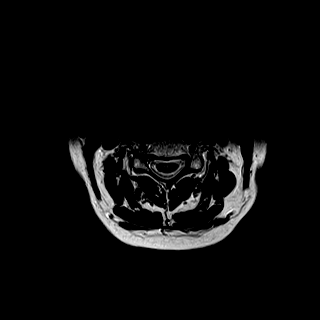
[im 21/25]
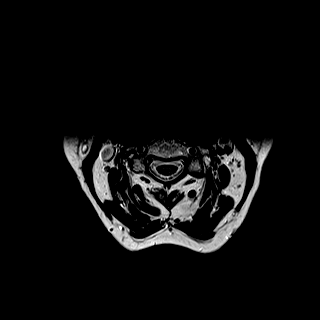
[im 25/25]
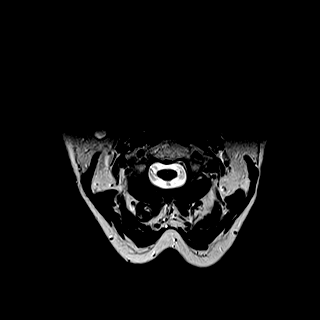

[Series 6: mpgr ax · axial · 3.0mm · 0.35mm/px · z∈[-39,+50]mm · 8 of 25 slices shown]
[im 1/25]
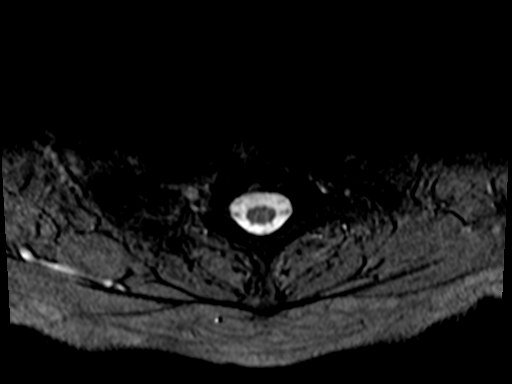
[im 5/25]
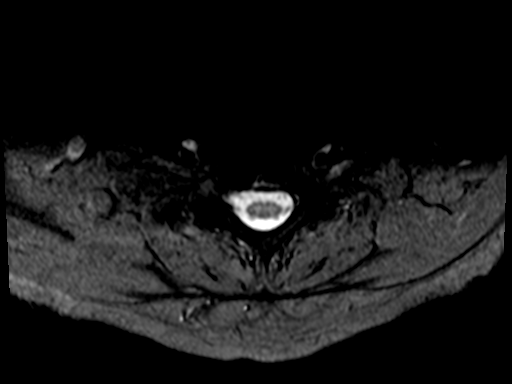
[im 9/25]
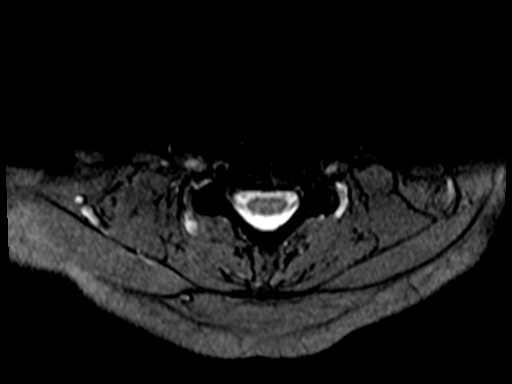
[im 11/25]
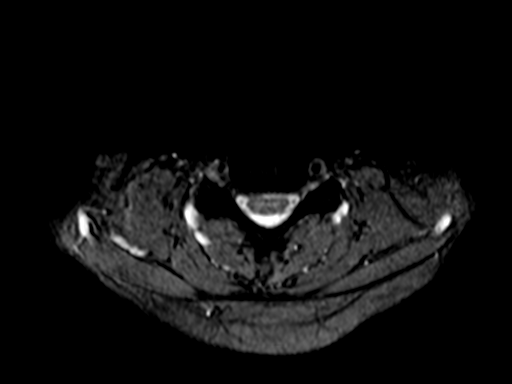
[im 15/25]
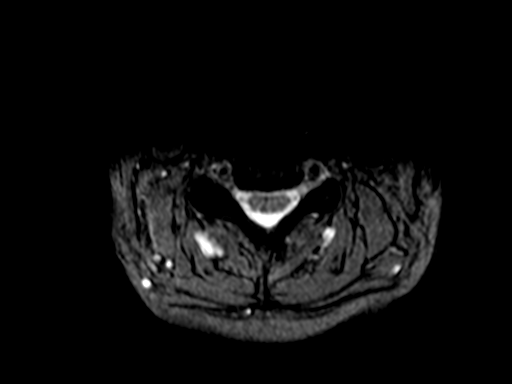
[im 17/25]
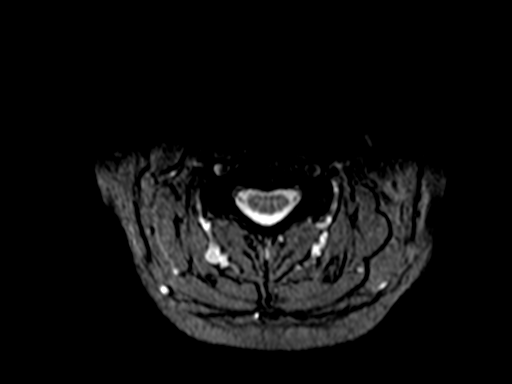
[im 21/25]
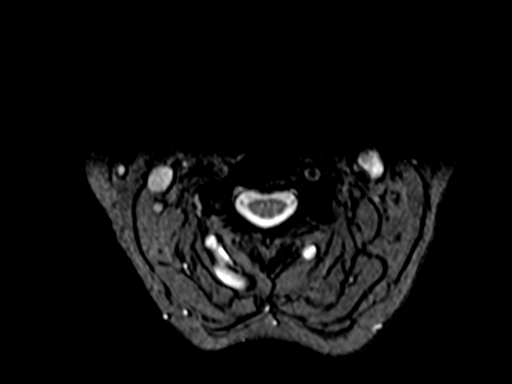
[im 25/25]
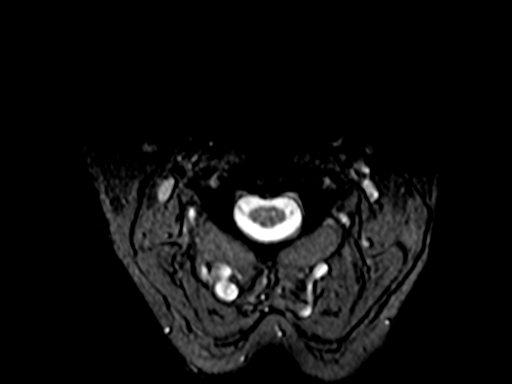

[41 of 48 positions shown; findings below may reference images not displayed]

FINDINGS: Alignment: Cervical spine straightening.  No significant listhesis.

Vertebrae: No fracture, suspicious osseous lesion, or significant
marrow edema. Mild chronic degenerative endplate changes at C5-6 and
C6-7 associated with moderate disc space narrowing, greater at C6-7.

Cord: Normal signal and morphology.

Posterior Fossa, vertebral arteries, paraspinal tissues:
Unremarkable.

Disc levels:

C2-3: Negative.

C3-4: Disc bulging eccentric to the right and mild facet arthrosis
result in mild right neural foraminal stenosis without spinal
stenosis.

C4-5: Mild disc bulging results in borderline to mild right neural
foraminal stenosis without spinal stenosis.

C5-6: Disc bulging greater to the right and mild uncovertebral
spurring result in moderate right neural foraminal stenosis with
potential right C6 nerve root impingement. No spinal stenosis.

C6-7: Broad-based posterior disc osteophyte complex without
significant stenosis.

C7-T1: Mild disc bulging without stenosis.
IMPRESSION: 1. Cervical disc degeneration most notable at C5-6 where there is
moderate right neural foraminal stenosis.
2. No spinal stenosis.

## 2021-07-06 ENCOUNTER — Ambulatory Visit
Payer: PPO | Attending: Student in an Organized Health Care Education/Training Program | Admitting: Student in an Organized Health Care Education/Training Program

## 2021-07-06 ENCOUNTER — Other Ambulatory Visit: Payer: Self-pay

## 2021-07-06 ENCOUNTER — Encounter: Payer: Self-pay | Admitting: Student in an Organized Health Care Education/Training Program

## 2021-07-06 ENCOUNTER — Other Ambulatory Visit: Payer: Self-pay | Admitting: Student in an Organized Health Care Education/Training Program

## 2021-07-06 VITALS — BP 166/82 | HR 86 | Temp 97.5°F | Resp 16 | Ht 65.0 in | Wt 175.0 lb

## 2021-07-06 DIAGNOSIS — G43809 Other migraine, not intractable, without status migrainosus: Secondary | ICD-10-CM | POA: Insufficient documentation

## 2021-07-06 DIAGNOSIS — R519 Headache, unspecified: Secondary | ICD-10-CM

## 2021-07-06 DIAGNOSIS — G894 Chronic pain syndrome: Secondary | ICD-10-CM

## 2021-07-06 DIAGNOSIS — M5412 Radiculopathy, cervical region: Secondary | ICD-10-CM | POA: Insufficient documentation

## 2021-07-06 DIAGNOSIS — G8929 Other chronic pain: Secondary | ICD-10-CM

## 2021-07-06 DIAGNOSIS — R221 Localized swelling, mass and lump, neck: Secondary | ICD-10-CM | POA: Diagnosis present

## 2021-07-06 DIAGNOSIS — M5030 Other cervical disc degeneration, unspecified cervical region: Secondary | ICD-10-CM | POA: Diagnosis present

## 2021-07-06 DIAGNOSIS — M47812 Spondylosis without myelopathy or radiculopathy, cervical region: Secondary | ICD-10-CM | POA: Insufficient documentation

## 2021-07-06 MED ORDER — TOPIRAMATE 25 MG PO TABS: ORAL_TABLET | ORAL | 0 refills | Status: DC

## 2021-07-06 MED ORDER — METHOCARBAMOL 500 MG PO TABS: 500.0000 mg | ORAL_TABLET | Freq: Three times a day (TID) | ORAL | 2 refills | Status: DC | PRN

## 2021-07-06 NOTE — Progress Notes (Signed)
PROVIDER NOTE: Information contained herein reflects review and annotations entered in association with encounter. Interpretation of such information and data should be left to medically-trained personnel. Information provided to patient can be located elsewhere in the medical record under "Patient Instructions". Document created using STT-dictation technology, any transcriptional errors that may result from process are unintentional.    Patient: Dawn Fuentes  Service Category: E/M  Provider: Gillis Santa, MD  DOB: 1945-12-30  DOS: 07/06/2021  Specialty: Interventional Pain Management  MRN: [id number]  Setting: Ambulatory outpatient  PCP: Steele Sizer, MD  Type: Established Patient    Referring Provider: Steele Sizer, MD  Location: Office  Delivery: Face-to-face     HPI  [name], a 75 y.o. year old female, is here today because of her Cervical facet joint syndrome [M47.812]. Ms. Heinsohn primary complain today is Headache (Right temple) Last encounter: My last encounter with her was on 03/30/2021.  Pain Assessment: Severity of Chronic pain is reported as a  /10. Location: Head Right/pain in right ear, with numbness in right side of the face. Onset: More than a month ago. Quality: Throbbing. Timing: Intermittent. Modifying factor(s): Tylenol, heat. Vitals:  height is '5\' 5"'  (1.651 m) and weight is 175 lb (79.4 kg). Her temporal temperature is 97.5 F (36.4 C) (abnormal). Her blood pressure is 166/82 (abnormal) and her pulse is 86. Her respiration is 16 and oxygen saturation is 98%.   Reason for encounter: follow-up evaluation    Patient is status post right superficial parotidectomy on 05/05/2021.  She states that she has some residual numbness along her lateral cheek and the tragus of her ear.  Hopefully this will get better over time.  She is still complaining of headaches.  The headaches are responsive to Tylenol and heat.  Patient has not tried Topamax in the past.  She does not  have a history of nephrolithiasis.  ROS  Constitutional: Denies any fever or chills Gastrointestinal: No reported hemesis, hematochezia, vomiting, or acute GI distress Musculoskeletal: Right facial headache Neurological: No reported episodes of acute onset apraxia, aphasia, dysarthria, agnosia, amnesia, paralysis, loss of coordination, or loss of consciousness  Medication Review  ALPRAZolam, Calcium Carb-Cholecalciferol, Cyanocobalamin, Fish Oil, HYDROcodone-acetaminophen, Latanoprostene Bunod, Turmeric, citalopram, clobetasol ointment, denosumab, hydrALAZINE, levocetirizine, levothyroxine, losartan-hydrochlorothiazide, methocarbamol, pantoprazole, simvastatin, timolol, topiramate, and zolpidem  History Review  Allergy: Ms. Doster is allergic to brimonidine tartrate, latanoprost, meloxicam, and raloxifene. Drug: Ms. Grecco  reports no history of drug use. Alcohol:  reports no history of alcohol use. Tobacco:  reports that she has never smoked. She has never used smokeless tobacco. Social: Ms. Skidmore  reports that she has never smoked. She has never used smokeless tobacco. She reports that she does not drink alcohol and does not use drugs. Medical:  has a past medical history of Allergy, Anxiety, Arthritis, Benign parotid tumor, Chronic insomnia, Colon adenoma, Dyslipidemia, Dysphagia, Female stress incontinence, GERD (gastroesophageal reflux disease), Glaucoma, both eyes, Headache, Hyperlipidemia, Hypertension, Hypothyroidism, Menopause syndrome, Osteoporosis, Polyp of colon, Renal insufficiency, and Vitamin D deficiency. Surgical: Ms. Archuleta  has a past surgical history that includes Ovarian cyst removal; Colonoscopy with propofol (N/A, 05/29/2015); Esophagogastroduodenoscopy (N/A, 05/29/2015); Appendectomy; Abdominal hysterectomy; Eye surgery; Photocoagulation with laser (Right, 11/21/2016); Esophagogastroduodenoscopy (N/A, 09/14/2020); Colonoscopy (N/A, 09/14/2020); Tonsillectomy; and  Parotidectomy (Right, 05/05/2021). Family: family history includes Aneurysm in her brother; COPD in her mother and sister; Cancer in her father; Dementia in her mother; Hypertension in her mother; Hypothyroidism in her daughter; Osteoporosis in her mother.  Laboratory  Chemistry Profile   Renal Lab Results  Component Value Date   BUN 18 04/15/2021   CREATININE 1.39 (H) 04/15/2021   LABCREA 15 (L) 10/19/2020   BCR 13 04/15/2021   GFRAA 44 (L) 04/15/2021   GFRNONAA 38 (L) 04/15/2021     Hepatic Lab Results  Component Value Date   AST 34 04/15/2021   ALT 33 (H) 04/15/2021   ALBUMIN 4.2 03/08/2017   ALKPHOS 50 03/08/2017     Electrolytes Lab Results  Component Value Date   NA 139 04/15/2021   K 4.4 04/15/2021   CL 102 04/15/2021   CALCIUM 9.9 04/15/2021     Bone Lab Results  Component Value Date   VD25OH 35 03/18/2020     Inflammation (CRP: Acute Phase) (ESR: Chronic Phase) No results found for: CRP, ESRSEDRATE, LATICACIDVEN     Note: Above Lab results reviewed.  Recent Imaging Review  [place] CORE BIOPSY (SALIVARY GLAND/PAROTID GLAND) INDICATION: 1.3 cm right parotid mass  EXAM: ULTRASOUND CORE BIOPSY RIGHT PAROTID MASS  MEDICATIONS: 1% LIDOCAINE LOCAL  ANESTHESIA/SEDATION: Moderate Sedation Time: None.  FLUOROSCOPY TIME:  Fluoroscopy Time: None.  COMPLICATIONS: None immediate.  PROCEDURE: Informed written consent was obtained from the patient after a thorough discussion of the procedural risks, benefits and alternatives. All questions were addressed. Maximal Sterile Barrier Technique was utilized including caps, mask, sterile gowns, sterile gloves, sterile drape, hand hygiene and skin antiseptic. A timeout was performed prior to the initiation of the procedure.  Previous imaging reviewed. Preliminary ultrasound performed. The right parotid lesion was localized and marked for biopsy.  Under sterile conditions and local anesthesia, and 18 gauge  core biopsy was advanced to the lesion. 3 18 gauge core biopsies obtained. Samples were intact and placed in formalin. Needle removed. Postprocedure imaging demonstrates no hemorrhage or hematoma. Patient tolerated the biopsy well.  IMPRESSION: Successful ultrasound right parotid mass 18 gauge core biopsy  Electronically Signed   By: Jerilynn Mages.  Shick M.D.   On: 03/23/2021 14:03 Note: Reviewed        Physical Exam  General appearance: Well nourished, well developed, and well hydrated. In no apparent acute distress Mental status: Alert, oriented x 3 (person, place, & time)       Respiratory: No evidence of acute respiratory distress Eyes: PERLA Vitals: BP (!) 166/82   Pulse 86   Temp (!) 97.5 F (36.4 C) (Temporal)   Resp 16   Ht '5\' 5"'  (1.651 m)   Wt 175 lb (79.4 kg)   LMP  (LMP Unknown)   SpO2 98%   BMI 29.12 kg/m  BMI: Estimated body mass index is 29.12 kg/m as calculated from the following:   Height as of this encounter: '5\' 5"'  (1.651 m).   Weight as of this encounter: 175 lb (79.4 kg). Ideal: Ideal body weight: 57 kg (125 lb 10.6 oz) Adjusted ideal body weight: 66 kg (145 lb 6.4 oz)  Assessment   Status Diagnosis  Controlled Controlled Controlled 1. Cervical facet joint syndrome   2. Cervicogenic migraine   3. Degeneration of cervical intervertebral disc   4. Neck swelling   5. Chronic nonintractable headache, unspecified headache type   6. Cervical radiculitis   7. Cervical radicular pain   8. Chronic pain syndrome       Plan of Care   [name] has a current medication list which includes the following long-term medication(s): calcium carb-cholecalciferol, citalopram, hydralazine, levocetirizine, levothyroxine, losartan-hydrochlorothiazide, pantoprazole, simvastatin, topiramate, and zolpidem.  Pharmacotherapy (Medications Ordered):  Meds ordered this encounter  Medications   methocarbamol (ROBAXIN) 500 MG tablet    Sig: Take 1 tablet (500 mg total) by  mouth every 8 (eight) hours as needed for muscle spasms.    Dispense:  60 tablet    Refill:  2    Do not place this medication, or any other prescription from our practice, on "Automatic Refill". Patient may have prescription filled one day early if pharmacy is closed on scheduled refill date.   topiramate (TOPAMAX) 25 MG tablet    Sig: Take 1 tablet (25 mg total) by mouth daily for 10 days, THEN 2 tablets (50 mg total) daily for 10 days, THEN 3 tablets (75 mg total) daily for 10 days, THEN 4 tablets (100 mg total) daily for 10 days.    Dispense:  100 tablet    Refill:  0   Follow-up plan:   Return in about 6 weeks (around 08/17/2021) for Medication Management, in person.     C7-T1 ESI, not effective, right C4, C5, C6 cervical facet medial branch nerve block #1 01/25/2021:100% pain relief for 1 day,  consider botox      Recent Visits No visits were found meeting these conditions. Showing recent visits within past 90 days and meeting all other requirements Today's Visits Date Type Provider Dept  07/06/21 Office Visit Gillis Santa, MD [facility]  Showing today's visits and meeting all other requirements Future Appointments Date Type Provider Dept  08/17/21 Appointment Gillis Santa, MD [facility]  Showing future appointments within next 90 days and meeting all other requirements I discussed the assessment and treatment plan with the patient. The patient was provided an opportunity to ask questions and all were answered. The patient agreed with the plan and demonstrated an understanding of the instructions.  Patient advised to call back or seek an in-person evaluation if the symptoms or condition worsens.  Duration of encounter: 16mnutes.  Note by: BGillis Santa MD Date: 07/06/2021; Time: 2:36 PM

## 2021-07-14 NOTE — Progress Notes (Deleted)
Name: Dawn Fuentes   MRN: [id number]    DOB: 03-Jun-1946   Date:07/14/2021       Progress Note  Subjective  Chief Complaint  UTI  HPI  *** Patient Active Problem List   Diagnosis Date Noted   Neck swelling 07/06/2021   Chronic nonintractable headache 07/06/2021   Cervicogenic migraine 01/14/2021   Cervical facet joint syndrome 01/14/2021   Cervical radiculitis 12/01/2020   Bilateral hip pain 12/01/2020   Chronic bilateral low back pain without sciatica 12/01/2020   Chronic SI joint pain 12/01/2020   Chronic pain syndrome 12/01/2020   Body mass index (BMI) 30.0-30.9, adult 11/18/2020   Essential (primary) hypertension 11/18/2020   Cervical radicular pain 11/18/2020   Degeneration of cervical intervertebral disc [id number]   Lichen sclerosus et atrophicus of the vulva 11/04/2019   [name] deficiency 12/26/2018   Osteoarthritis of knees, bilateral 05/26/2017   Primary open angle glaucoma of both eyes 11/21/2016   Chronic kidney disease (CKD), stage III (moderate) ([place]) 04/06/2016   Cystitis 10/12/2015   Benign hypertension 08/21/2015   Insomnia, persistent 08/21/2015   Dyslipidemia 08/21/2015   Gastro-esophageal reflux disease without esophagitis 08/21/2015   Glaucoma 08/21/2015   Blood glucose elevated 08/21/2015   Adult hypothyroidism 08/21/2015   Climacteric 08/21/2015   Senile osteoporosis 08/21/2015   Seasonal allergies 08/21/2015   Female genuine stress incontinence 08/21/2015   Vitamin D deficiency 08/21/2015    Past Surgical History:  Procedure Laterality Date   ABDOMINAL HYSTERECTOMY     21 years ago   APPENDECTOMY     COLONOSCOPY N/A 09/14/2020   Procedure: COLONOSCOPY;  Surgeon: Lesly Rubenstein, MD;  Location: [facility] ENDOSCOPY;  Service: Endoscopy;  Laterality: N/A;   COLONOSCOPY WITH PROPOFOL N/A 05/29/2015   Procedure: COLONOSCOPY WITH PROPOFOL;  Surgeon: Hulen Luster, MD;  Location: [facility] ENDOSCOPY;  Service: Gastroenterology;  Laterality: N/A;    ESOPHAGOGASTRODUODENOSCOPY N/A 05/29/2015   Procedure: ESOPHAGOGASTRODUODENOSCOPY (EGD);  Surgeon: Hulen Luster, MD;  Location: [facility] ENDOSCOPY;  Service: Gastroenterology;  Laterality: N/A;   ESOPHAGOGASTRODUODENOSCOPY N/A 09/14/2020   Procedure: ESOPHAGOGASTRODUODENOSCOPY (EGD);  Surgeon: Lesly Rubenstein, MD;  Location[facility] ENDOSCOPY;  Service: Endoscopy;  Laterality: N/A;   EYE SURGERY     eye lids   OVARIAN CYST REMOVAL     needed transfusion   PAROTIDECTOMY Right 05/05/2021   Procedure: SUPERFICIAL PAROTIDECTOMY;  Surgeon: Clyde Canterbury, MD;  Location: [facility] ORS;  Service: ENT;  Laterality: Right;   PHOTOCOAGULATION WITH LASER Right 11/21/2016   Procedure: PHOTOCOAGULATION WITH LASER;  Surgeon: Ronnell Freshwater, MD;  Location: [place];  Service: Ophthalmology;  Laterality: Right;  A micropulse probe was applied to each hemilimbus with the following settings: 2040m, 31.3% duty cycle, 90 seconds x 3 applications.    TONSILLECTOMY      Family History  Problem Relation Age of Onset   Hypertension Mother    Osteoporosis Mother    COPD Mother    Dementia Mother    Cancer Father        Colon   Hypothyroidism Daughter    Aneurysm Brother    COPD Sister     Social History   Tobacco Use   Smoking status: Never   Smokeless tobacco: Never   Tobacco comments:    smoking cessation materials not required  Substance Use Topics   Alcohol use: No    Alcohol/week: 0.0 standard drinks     Current Outpatient Medications:    ALPRAZolam (XANAX) 0.5 MG tablet, Take 1  tablet (0.5 mg total) by mouth daily as needed for anxiety. Rarely (Patient taking differently: Take 0.25 mg by mouth daily as needed for anxiety. Rarely), Disp: 12 tablet, Rfl: 1   Calcium Carb-Cholecalciferol 600-800 MG-UNIT TABS, Take 1 tablet by mouth daily., Disp: , Rfl:    citalopram (CELEXA) 20 MG tablet, Take 1 tablet (20 mg total) by mouth daily., Disp: 90 tablet, Rfl: 1   clobetasol ointment  (TEMOVATE) 0.05 %, Apply to affected area every night for 4 weeks, then every other day for 4 weeks and then twice a week for 4 weeks or until resolution. (Patient taking differently: Apply 1 application topically daily as needed (Vaginal). Apply to affected area every night for 4 weeks, then every other day for 4 weeks and then twice a week for 4 weeks or until resolution.), Disp: 30 g, Rfl: 5   Cyanocobalamin (VITAMIN B-12 IJ), Inject as directed every 30 (thirty) days., Disp: , Rfl:    denosumab (PROLIA) 60 MG/ML SOSY injection, Inject 60 mg into the skin every 6 (six) months., Disp: , Rfl:    hydrALAZINE (APRESOLINE) 25 MG tablet, TAKE 1 TABLET BY MOUTH THREE TIMES DAILY; IF BLOOD PRESSURE ABOVE 140/90 (Patient taking differently: Take 25 mg by mouth daily.), Disp: 270 tablet, Rfl: 0   HYDROcodone-acetaminophen (NORCO/VICODIN) 5-325 MG tablet, Take 1-2 tablets by mouth every 6 (six) hours as needed for moderate pain., Disp: 30 tablet, Rfl: 0   levocetirizine (XYZAL) 5 MG tablet, Take 1 tablet (5 mg total) by mouth every evening., Disp: 90 tablet, Rfl: 1   levothyroxine (SYNTHROID) 50 MCG tablet, TAKE 2 TABLETS BY MOUTH DAILY BEFORE BREAKFAST ON MONDAY, WEDNESDAY, FRIDAY, AND 1 TABLET ON ALL OTHER DAYS OF THE WEEK, Disp: 128 tablet, Rfl: 0   losartan-hydrochlorothiazide (HYZAAR) 100-12.5 MG tablet, Take 1 tablet by mouth daily., Disp: 90 tablet, Rfl: 1   methocarbamol (ROBAXIN) 500 MG tablet, Take 1 tablet (500 mg total) by mouth every 8 (eight) hours as needed for muscle spasms., Disp: 60 tablet, Rfl: 2   Omega-3 Fatty Acids (FISH OIL) 1000 MG CAPS, Take 1,000 mg by mouth daily., Disp: , Rfl:    pantoprazole (PROTONIX) 40 MG tablet, TAKE 1 TABLET(40 MG) BY MOUTH DAILY (Patient taking differently: Take 40 mg by mouth daily.), Disp: 90 tablet, Rfl: 2   simvastatin (ZOCOR) 40 MG tablet, TAKE 1 TABLET(40 MG) BY MOUTH DAILY (Patient taking differently: Take 40 mg by mouth every evening.), Disp: 90 tablet,  Rfl: 1   timolol (TIMOPTIC) 0.5 % ophthalmic solution, Place 1 drop into both eyes 2 (two) times daily., Disp: , Rfl: 5   topiramate (TOPAMAX) 25 MG tablet, Take 1 tablet (25 mg total) by mouth daily for 10 days, THEN 2 tablets (50 mg total) daily for 10 days, THEN 3 tablets (75 mg total) daily for 10 days, THEN 4 tablets (100 mg total) daily for 10 days., Disp: 100 tablet, Rfl: 0   Turmeric 500 MG TABS, Take 500 mg by mouth in the morning and at bedtime., Disp: , Rfl:    VYZULTA 0.024 % SOLN, Place 1 drop into both eyes at bedtime., Disp: , Rfl: 2   zolpidem (AMBIEN) 5 MG tablet, Take qhs (Patient taking differently: Take 5 mg by mouth at bedtime.), Disp: 90 tablet, Rfl: 1  Current Facility-Administered Medications:    cyanocobalamin ((VITAMIN B-12)) injection 1,000 mcg, 1,000 mcg, Intramuscular, Q30 days, Steele Sizer, MD, 1,000 mcg at 06/10/21 1353  Allergies  Allergen Reactions   Brimonidine  Tartrate Other (See Comments)    Burning in eyes   Latanoprost Other (See Comments)    burning   Meloxicam     Increased blood pressure    Raloxifene     bone pain    I personally reviewed {Reviewed:14835} with the patient/caregiver today.   ROS  ***  Objective  There were no vitals filed for this visit.  There is no height or weight on file to calculate BMI.  Physical Exam ***  Recent Results (from the past 2160 hour(s))  Lipid panel     Status: None   Collection Time: 04/15/21  1:52 PM  Result Value Ref Range   Cholesterol 146 <200 mg/dL   HDL 57 > OR = 50 mg/dL   Triglycerides 118 <150 mg/dL   LDL Cholesterol (Calc) 69 mg/dL (calc)    Comment: Reference range: <100 . Desirable range <100 mg/dL for primary prevention;   <70 mg/dL for patients with CHD or diabetic patients  with > or = 2 CHD risk factors. Marland Kitchen LDL-C is now calculated using the Martin-Hopkins  calculation, which is a validated novel method providing  better accuracy than the Friedewald equation in the   estimation of LDL-C.  Cresenciano Genre et al. Annamaria Helling. [id number]): 2061-2068  (http://education.[facility].com/faq/FAQ164)    Total CHOL/HDL Ratio 2.6 <5.0 (calc)   Non-HDL Cholesterol (Calc) 89 <130 mg/dL (calc)    Comment: For patients with diabetes plus 1 major ASCVD risk  factor, treating to a non-HDL-C goal of <100 mg/dL  (LDL-C of <70 mg/dL) is considered a therapeutic  option.   CBC with Differential/Platelet     Status: Abnormal   Collection Time: 04/15/21  1:52 PM  Result Value Ref Range   WBC 5.4 3.8 - 10.8 Thousand/uL   RBC 4.25 3.80 - 5.10 Million/uL   Hemoglobin 11.3 (L) 11.7 - 15.5 g/dL   HCT 35.8 35.0 - 45.0 %   MCV 84.2 80.0 - 100.0 fL   MCH 26.6 (L) 27.0 - 33.0 pg   MCHC 31.6 (L) 32.0 - 36.0 g/dL   RDW 13.9 11.0 - 15.0 %   Platelets 294 140 - 400 Thousand/uL   MPV 11.7 7.5 - 12.5 fL   Neutro Abs 2,900 1,500 - 7,800 cells/uL   Lymphs Abs 1,469 850 - 3,900 cells/uL   Absolute Monocytes 778 200 - 950 cells/uL   Eosinophils Absolute 151 15 - 500 cells/uL   Basophils Absolute 103 0 - 200 cells/uL   Neutrophils Relative % 53.7 %   Total Lymphocyte 27.2 %   Monocytes Relative 14.4 %   Eosinophils Relative 2.8 %   Basophils Relative 1.9 %  COMPLETE METABOLIC PANEL WITH GFR     Status: Abnormal   Collection Time: 04/15/21  1:52 PM  Result Value Ref Range   Glucose, Bld 130 (H) 65 - 99 mg/dL    Comment: .            Fasting reference interval . For someone without known diabetes, a glucose value >125 mg/dL indicates that they may have diabetes and this should be confirmed with a follow-up test. .    BUN 18 7 - 25 mg/dL   Creat 1.39 (H) 0.60 - 0.93 mg/dL    Comment: For patients >21 years of age, the reference limit for Creatinine is approximately 13% higher for people identified as African-American. .    GFR, Est Non African American 38 (L) > OR = 60 mL/min/1.85m   GFR, Est African American 44 (L) >  OR = 60 mL/min/1.73m   BUN/Creatinine Ratio 13 6 - 22  (calc)   Sodium 139 135 - 146 mmol/L   Potassium 4.4 3.5 - 5.3 mmol/L   Chloride 102 98 - 110 mmol/L   CO2 29 20 - 32 mmol/L   Calcium 9.9 8.6 - 10.4 mg/dL   Total Protein 7.1 6.1 - 8.1 g/dL   Albumin 4.8 3.6 - 5.1 g/dL   Globulin 2.3 1.9 - 3.7 g/dL (calc)   AG Ratio 2.1 1.0 - 2.5 (calc)   Total Bilirubin 0.4 0.2 - 1.2 mg/dL   Alkaline phosphatase (APISO) 48 37 - 153 U/L   AST 34 10 - 35 U/L   ALT 33 (H) 6 - 29 U/L  Hemoglobin A1c     Status: Abnormal   Collection Time: 04/15/21  1:52 PM  Result Value Ref Range   Hgb A1c MFr Bld 6.2 (H) <5.7 % of total Hgb    Comment: For someone without known diabetes, a hemoglobin  A1c value between 5.7% and 6.4% is consistent with prediabetes and should be confirmed with a  follow-up test. . For someone with known diabetes, a value <7% indicates that their diabetes is well controlled. A1c targets should be individualized based on duration of diabetes, age, comorbid conditions, and other considerations. . This assay result is consistent with an increased risk of diabetes. . Currently, no consensus exists regarding use of hemoglobin A1c for diagnosis of diabetes for children. .    Mean Plasma Glucose 131 mg/dL   eAG (mmol/L) 7.3 mmol/L  TSH     Status: None   Collection Time: 04/15/21  1:52 PM  Result Value Ref Range   TSH 0.80 0.40 - 4.50 mIU/L  Surgical pathology     Status: None   Collection Time: 05/05/21 10:14 AM  Result Value Ref Range   SURGICAL PATHOLOGY      SURGICAL PATHOLOGY CASE: [id number] PATIENT: JBea GraffSurgical Pathology Report     Specimen Submitted: A. Parotid, right superficial  Clinical History: Neck swelling      DIAGNOSIS: A. PAROTID GLAND, RIGHT SUPERFICIAL; EXCISION: - ONCOCYTOMA, NARROWLY EXCISED. - ONE BENIGN LYMPH NODE. - NEGATIVE FOR MALIGNANCY.   GROSS DESCRIPTION: A. Labeled: Right superficial parotid Received: Formalin Collection time: 10:14 AM on 05/05/2021 Placed into  formalin time: 10:15 AM on 05/05/2021 Tissue fragment(s): 1 Size: 5 x 2.4 x 2.4 cm; 10.05 g Description: Received is a single fragment of yellow and pink lobulated soft tissue.  The specimen is unoriented and entirely inked blue.  The specimen is serially sectioned revealing 1.5 x 1.4 x 1.1 cm tan-brown, well-circumscribed, and homogenous mass.  The mass is focally less than 0.1 cm to the closest inked margin.  The remainder of the specimen is comprised of yellow lobulated adipose tissue admixed with tan-pink  soft tissue.  No additional distinct masses or lesions are grossly identified. The specimen is submitted as follows: 1 - 4 - mass, submitted entirely and sequentially      1 - closest approach to inked margin      4 - mass, perpendicularly sectioned 5 - representative grossly normal tissue  RB 05/05/2021  Final Diagnosis performed by ABetsy Pries MD.   Electronically signed 05/06/2021 10:05:09AM The electronic signature indicates that the named Attending Pathologist has evaluated the specimen [place] [address] [place] [address]Lab: [phone]: SRush Farmer MD, MMM  Professional [facility] [address]  [place], [place], [place] [address] Lab: [phone] Dir: Dellia Nims. Rubinas, MD     Diabetic Foot Exam: Diabetic Foot Exam - Simple   No data filed    ***  PHQ2/9: Depression screen [facility] 2/9 07/06/2021 03/30/2021 03/10/2021 12/07/2020 12/01/2020  Decreased Interest 0 0 0 0 0  Down, Depressed, Hopeless 0 0 0 0 0  PHQ - 2 Score 0 0 0 0 0  Altered sleeping - - - 0 -  Tired, decreased energy - - - 0 -  Change in appetite - - - 0 -  Feeling bad or failure about yourself  - - - 0 -  Trouble concentrating - - - 0 -  Moving slowly or fidgety/restless - - - 0 -  Suicidal thoughts - - - 0 -  PHQ-9 Score - - - 0 -  Difficult doing work/chores - - - Not difficult at all -   Some recent data might be hidden    phq 9 is {gen pos [id number] ***  Fall Risk: Fall Risk  07/06/2021 03/30/2021 03/10/2021 02/11/2021 12/21/2020  Falls in the past year? 0 0 0 0 0  Number falls in past yr: - - 0 - 0  Injury with Fall? - - 0 - -  Risk for fall due to : - - - - No Fall Risks  Risk for fall due to: Comment - - - - -  Follow up - - - - Falls evaluation completed   ***   Functional Status Survey:   ***   Assessment & Plan  *** There are no diagnoses linked to this encounter.

## 2021-07-15 ENCOUNTER — Ambulatory Visit: Payer: PPO | Admitting: Family Medicine

## 2021-07-15 DIAGNOSIS — M810 Age-related osteoporosis without current pathological fracture: Secondary | ICD-10-CM | POA: Diagnosis not present

## 2021-07-21 ENCOUNTER — Ambulatory Visit (INDEPENDENT_AMBULATORY_CARE_PROVIDER_SITE_OTHER): Payer: PPO

## 2021-07-21 ENCOUNTER — Other Ambulatory Visit: Payer: Self-pay

## 2021-07-21 ENCOUNTER — Encounter: Payer: Self-pay | Admitting: Family Medicine

## 2021-07-21 ENCOUNTER — Other Ambulatory Visit: Payer: Self-pay | Admitting: Family Medicine

## 2021-07-21 DIAGNOSIS — E538 Deficiency of other specified B group vitamins: Secondary | ICD-10-CM | POA: Diagnosis not present

## 2021-07-21 DIAGNOSIS — F411 Generalized anxiety disorder: Secondary | ICD-10-CM

## 2021-07-21 MED ORDER — ALPRAZOLAM 0.5 MG PO TABS: 0.5000 mg | ORAL_TABLET | Freq: Every day | ORAL | 0 refills | Status: DC | PRN

## 2021-07-21 MED ORDER — CYANOCOBALAMIN 1000 MCG/ML IJ SOLN
1000.0000 ug | Freq: Once | INTRAMUSCULAR | Status: AC
  Administered 2021-07-21: 1000 ug via INTRAMUSCULAR

## 2021-08-03 ENCOUNTER — Telehealth: Payer: Self-pay | Admitting: Student in an Organized Health Care Education/Training Program

## 2021-08-03 NOTE — Telephone Encounter (Signed)
Returned patient phone call re; side affects from topiramate and it is not helping headaches.  I told patient that most likely he would have her stop the medication but I would prefer to check with him before her doing this.  She is up to taking the 3 pills per day.

## 2021-08-04 NOTE — Telephone Encounter (Signed)
Patient notified to stop taking the topamax.  Patient wanted [place] to let you know that she has developed numbness in her left hand starting approximately 3 days ago and this is new.

## 2021-08-17 ENCOUNTER — Other Ambulatory Visit: Payer: Self-pay

## 2021-08-17 ENCOUNTER — Ambulatory Visit
Payer: PPO | Attending: Student in an Organized Health Care Education/Training Program | Admitting: Student in an Organized Health Care Education/Training Program

## 2021-08-17 ENCOUNTER — Encounter: Payer: Self-pay | Admitting: Student in an Organized Health Care Education/Training Program

## 2021-08-17 VITALS — BP 159/85 | HR 84 | Temp 96.7°F | Resp 16 | Ht 65.0 in | Wt 170.0 lb

## 2021-08-17 DIAGNOSIS — R221 Localized swelling, mass and lump, neck: Secondary | ICD-10-CM | POA: Insufficient documentation

## 2021-08-17 DIAGNOSIS — M5412 Radiculopathy, cervical region: Secondary | ICD-10-CM | POA: Diagnosis present

## 2021-08-17 DIAGNOSIS — G8929 Other chronic pain: Secondary | ICD-10-CM | POA: Insufficient documentation

## 2021-08-17 DIAGNOSIS — M47812 Spondylosis without myelopathy or radiculopathy, cervical region: Secondary | ICD-10-CM | POA: Diagnosis present

## 2021-08-17 DIAGNOSIS — R519 Headache, unspecified: Secondary | ICD-10-CM | POA: Diagnosis present

## 2021-08-17 DIAGNOSIS — G43809 Other migraine, not intractable, without status migrainosus: Secondary | ICD-10-CM | POA: Diagnosis present

## 2021-08-17 DIAGNOSIS — G894 Chronic pain syndrome: Secondary | ICD-10-CM | POA: Insufficient documentation

## 2021-08-17 MED ORDER — TOPIRAMATE 25 MG PO TABS: 25.0000 mg | ORAL_TABLET | Freq: Every day | ORAL | 2 refills | Status: DC

## 2021-08-17 NOTE — Progress Notes (Signed)
PROVIDER NOTE: Information contained herein reflects review and annotations entered in association with encounter. Interpretation of such information and data should be left to medically-trained personnel. Information provided to patient can be located elsewhere in the medical record under "Patient Instructions". Document created using STT-dictation technology, any transcriptional errors that may result from process are unintentional.    Patient: Dawn Fuentes  Service Category: E/M  Provider: Gillis Santa, MD  DOB: [birth year]  DOS: 08/17/2021  Specialty: Interventional Pain Management  MRN: [id number]  Setting: Ambulatory outpatient  PCP: Steele Sizer, MD  Type: Established Patient    Referring Provider: Steele Sizer, MD  Location: Office  Delivery: Face-to-face     HPI  Ms. REBEL LAUGHRIDGE, a 75 y.o. year old female, is here today because of her Cervical facet joint syndrome [M47.812]. Ms. Killings primary complain today is Headache (Right temple) Last encounter: My last encounter with her was on 07/06/21  Pain Assessment: Severity of Chronic pain is reported as a 0-No pain/10. Location: Head Right/may radiate to right ear; numbness right side of face. Onset: More than a month ago. Quality: Throbbing. Timing: Intermittent. Modifying factor(s): tylrnol. Vitals:  height is '5\' 5"'  (1.651 m) and weight is 170 lb (77.1 kg). Her temporal temperature is 96.7 F (35.9 C) (abnormal). Her blood pressure is 159/85 (abnormal) and her pulse is 84. Her respiration is 16 and oxygen saturation is 98%.   Reason for encounter: medication management.    Follows up today for MM. At her last clinic visit we discussed trial of Topamax. Patient tried this for 28 days, states that she noticed change in taste, numbness of her hands, and weight loss with dose escalation to 75 mg. She states that the frequency of her headaches have decreased overall but doesn't want to continue Tramadol due to side effects at 75  mg States that she gets good analgesic benefit with prn APAP.  We discussed restarting Topamax, at a lower dose of 25 mg.  She recalls that she was not experiencing any side effects at this dose. She is having some numbness along her left forearm and hand that she states started while she was on Topamax.  Paresthesias are a known side effect and up to 50% of patients I provided her with reassurance that it will hopefully improve. If she does notice it worsen while she is on the 25 mg of Topamax, I instructed her to discontinue.   ROS  Constitutional: Denies any fever or chills Gastrointestinal: No reported hemesis, hematochezia, vomiting, or acute GI distress Musculoskeletal: Right facial headache Neurological: No reported episodes of acute onset apraxia, aphasia, dysarthria, agnosia, amnesia, paralysis, loss of coordination, or loss of consciousness  Medication Review  ALPRAZolam, Calcium Carb-Cholecalciferol, Cyanocobalamin, Fish Oil, HYDROcodone-acetaminophen, Latanoprostene Bunod, Turmeric, citalopram, clobetasol ointment, denosumab, hydrALAZINE, levocetirizine, levothyroxine, losartan-hydrochlorothiazide, methocarbamol, pantoprazole, simvastatin, timolol, topiramate, and zolpidem  History Review  Allergy: Ms. Surita is allergic to brimonidine tartrate, latanoprost, meloxicam, and raloxifene. Drug: Ms. Wysong  reports no history of drug use. Alcohol:  reports no history of alcohol use. Tobacco:  reports that she has never smoked. She has never used smokeless tobacco. Social: Ms. Sainato  reports that she has never smoked. She has never used smokeless tobacco. She reports that she does not drink alcohol and does not use drugs. Medical:  has a past medical history of Allergy, Anxiety, Arthritis, Benign parotid tumor, Chronic insomnia, Colon adenoma, Dyslipidemia, Dysphagia, Female stress incontinence, GERD (gastroesophageal reflux disease), Glaucoma, both eyes, Headache, Hyperlipidemia,  Hypertension,  Hypothyroidism, Menopause syndrome, Osteoporosis, Polyp of colon, Renal insufficiency, and Vitamin D deficiency. Surgical: Ms. Barthel  has a past surgical history that includes Ovarian cyst removal; Colonoscopy with propofol (N/A, 05/29/2015); Esophagogastroduodenoscopy (N/A, 05/29/2015); Appendectomy; Abdominal hysterectomy; Eye surgery; Photocoagulation with laser (Right, 11/21/2016); Esophagogastroduodenoscopy (N/A, 09/14/2020); Colonoscopy (N/A, 09/14/2020); Tonsillectomy; and Parotidectomy (Right, 05/05/2021). Family: family history includes Aneurysm in her brother; COPD in her mother and sister; Cancer in her father; Dementia in her mother; Hypertension in her mother; Hypothyroidism in her daughter; Osteoporosis in her mother.  Laboratory Chemistry Profile   Renal Lab Results  Component Value Date   BUN 18 04/15/2021   CREATININE 1.39 (H) 04/15/2021   LABCREA 15 (L) 10/19/2020   BCR 13 04/15/2021   GFRAA 44 (L) 04/15/2021   GFRNONAA 38 (L) 04/15/2021     Hepatic Lab Results  Component Value Date   AST 34 04/15/2021   ALT 33 (H) 04/15/2021   ALBUMIN 4.2 03/08/2017   ALKPHOS 50 03/08/2017     Electrolytes Lab Results  Component Value Date   NA 139 04/15/2021   K 4.4 04/15/2021   CL 102 04/15/2021   CALCIUM 9.9 04/15/2021     Bone Lab Results  Component Value Date   VD25OH 35 03/18/2020     Inflammation (CRP: Acute Phase) (ESR: Chronic Phase) No results found for: CRP, ESRSEDRATE, LATICACIDVEN     Note: Above Lab results reviewed.  Recent Imaging Review  [place] CORE BIOPSY (SALIVARY GLAND/PAROTID GLAND) INDICATION: 1.3 cm right parotid mass  EXAM: ULTRASOUND CORE BIOPSY RIGHT PAROTID MASS  MEDICATIONS: 1% LIDOCAINE LOCAL  ANESTHESIA/SEDATION: Moderate Sedation Time: None.  FLUOROSCOPY TIME:  Fluoroscopy Time: None.  COMPLICATIONS: None immediate.  PROCEDURE: Informed written consent was obtained from the patient after a thorough discussion of  the procedural risks, benefits and alternatives. All questions were addressed. Maximal Sterile Barrier Technique was utilized including caps, mask, sterile gowns, sterile gloves, sterile drape, hand hygiene and skin antiseptic. A timeout was performed prior to the initiation of the procedure.  Previous imaging reviewed. Preliminary ultrasound performed. The right parotid lesion was localized and marked for biopsy.  Under sterile conditions and local anesthesia, and 18 gauge core biopsy was advanced to the lesion. 3 18 gauge core biopsies obtained. Samples were intact and placed in formalin. Needle removed. Postprocedure imaging demonstrates no hemorrhage or hematoma. Patient tolerated the biopsy well.  IMPRESSION: Successful ultrasound right parotid mass 18 gauge core biopsy  Electronically Signed   By: Jerilynn Mages.  Shick M.D.   On: 03/23/2021 14:03 Note: Reviewed        Physical Exam  General appearance: Well nourished, well developed, and well hydrated. In no apparent acute distress Mental status: Alert, oriented x 3 (person, place, & time)       Respiratory: No evidence of acute respiratory distress Eyes: PERLA Vitals: BP (!) 159/85 (BP Location: Right Arm, Patient Position: Sitting, Cuff Size: Normal)   Pulse 84   Temp (!) 96.7 F (35.9 C) (Temporal)   Resp 16   Ht '5\' 5"'  (1.651 m)   Wt 170 lb (77.1 kg)   LMP  (LMP Unknown)   SpO2 98%   BMI 28.29 kg/m  BMI: Estimated body mass index is 28.29 kg/m as calculated from the following:   Height as of this encounter: '5\' 5"'  (1.651 m).   Weight as of this encounter: 170 lb (77.1 kg). Ideal: Ideal body weight: 57 kg (125 lb 10.6 oz) Adjusted ideal body weight: 65 kg (143 lb 6.4  oz)  Assessment   Status Diagnosis  Controlled Controlled Controlled 1. Cervical facet joint syndrome   2. Cervicogenic migraine   3. Neck swelling   4. Chronic nonintractable headache, unspecified headache type   5. Cervical radicular pain   6. Chronic  pain syndrome        Plan of Care   [name] has a current medication list which includes the following long-term medication(s): calcium carb-cholecalciferol, citalopram, hydralazine, levocetirizine, levothyroxine, losartan-hydrochlorothiazide, pantoprazole, simvastatin, zolpidem, and topiramate.  Pharmacotherapy (Medications Ordered): Meds ordered this encounter  Medications   topiramate (TOPAMAX) 25 MG tablet    Sig: Take 1 tablet (25 mg total) by mouth daily.    Dispense:  30 tablet    Refill:  2    Follow-up plan:   Return in about 3 months (around 11/17/2021) for Medication Management, in person.     C7-T1 ESI, not effective, right C4, C5, C6 cervical facet medial branch nerve block #1 01/25/2021:100% pain relief for 1 day,  consider botox      Recent Visits Date Type Provider Dept  07/06/21 Office Visit Gillis Santa, MD [facility]  Showing recent visits within past 90 days and meeting all other requirements Today's Visits Date Type Provider Dept  08/17/21 Office Visit Gillis Santa, MD [facility]  Showing today's visits and meeting all other requirements Future Appointments No visits were found meeting these conditions. Showing future appointments within next 90 days and meeting all other requirements I discussed the assessment and treatment plan with the patient. The patient was provided an opportunity to ask questions and all were answered. The patient agreed with the plan and demonstrated an understanding of the instructions.  Patient advised to call back or seek an in-person evaluation if the symptoms or condition worsens.  Duration of encounter: 63mnutes.  Note by: BGillis Santa MD Date: 08/17/2021; Time: 1:05 PM

## 2021-08-17 NOTE — Progress Notes (Signed)
Safety precautions to be maintained throughout the outpatient stay will include: orient to surroundings, keep bed in low position, maintain call bell within reach at all times, provide assistance with transfer out of bed and ambulation.  

## 2021-08-25 ENCOUNTER — Other Ambulatory Visit: Payer: Self-pay | Admitting: Family Medicine

## 2021-08-25 DIAGNOSIS — G4700 Insomnia, unspecified: Secondary | ICD-10-CM

## 2021-08-25 NOTE — Telephone Encounter (Signed)
Requested medication (s) are due for refill today - 10 days  Requested medication (s) are on the active medication list -yes  Future visit scheduled -yes  Last refill: 03/10/21 #90 1RF  Notes to clinic: Request RF- non delegated Rx  Requested Prescriptions  Pending Prescriptions Disp Refills   zolpidem (AMBIEN) 5 MG tablet [Pharmacy Med Name: ZOLPIDEM '5MG'$  TABLETS] 90 tablet     Sig: TAKE 1 TABLET BY MOUTH EVERY NIGHT AT BEDTIME     Not Delegated - Psychiatry:  Anxiolytics/Hypnotics Failed - 08/25/2021  7:35 AM      Failed - This refill cannot be delegated      Failed - Urine Drug Screen completed in last 360 days      Passed - Valid encounter within last 6 months    Recent Outpatient Visits           5 months ago Vitamin D deficiency   [facility] Steele Sizer, MD   8 months ago Well adult exam   [facility] Steele Sizer, MD   9 months ago Cervical radiculitis   [facility] Steele Sizer, MD   10 months ago Benign hypertension   [facility] Steele Sizer, MD   11 months ago Benign hypertension   [facility] Steele Sizer, MD       Future Appointments             In 2 weeks Steele Sizer, MD [facility], [place]   In 2 weeks  [facility], [facility]               Requested Prescriptions  Pending Prescriptions Disp Refills   zolpidem (AMBIEN) 5 MG tablet [Pharmacy Med Name: ZOLPIDEM '5MG'$  TABLETS] 90 tablet     Sig: TAKE 1 TABLET BY MOUTH EVERY NIGHT AT BEDTIME     Not Delegated - Psychiatry:  Anxiolytics/Hypnotics Failed - 08/25/2021  7:35 AM      Failed - This refill cannot be delegated      Failed - Urine Drug Screen completed in last 360 days      Passed - Valid encounter within last 6 months    Recent Outpatient Visits           5 months ago Vitamin D deficiency   [facility] Steele Sizer,  MD   8 months ago Well adult exam   [facility] Steele Sizer, MD   9 months ago Cervical radiculitis   [facility] Steele Sizer, MD   10 months ago Benign hypertension   [facility] Steele Sizer, MD   11 months ago Benign hypertension   [facility] Steele Sizer, MD       Future Appointments             In 2 weeks Steele Sizer, MD [facility], [place]   In 2 weeks  [place]

## 2021-09-02 ENCOUNTER — Telehealth: Payer: Self-pay | Admitting: *Deleted

## 2021-09-02 NOTE — Chronic Care Management (AMB) (Signed)
  Chronic Care Management   Outreach Note  09/02/2021 Name: Dawn Fuentes MRN: [id number] DOB: 15-Jan-1946  Dawn Fuentes is a 75 y.o. year old female who is a primary care patient of Steele Sizer, MD. I reached out to [name] by phone today in response to a referral sent by Ms. Dianah Field PCP Steele Sizer, MD     An unsuccessful telephone outreach was attempted today. The patient was referred to the case management team for assistance with care management and care coordination.   Follow Up Plan: A HIPAA compliant phone message was left for the patient providing contact information and requesting a return call.  If patient returns call to provider office, please advise to call Embedded Care Management Care Guide Renae Mottley at [place] [place] [facility]  Direct Dial: [phone]

## 2021-09-06 DIAGNOSIS — H401132 Primary open-angle glaucoma, bilateral, moderate stage: Secondary | ICD-10-CM | POA: Diagnosis not present

## 2021-09-10 ENCOUNTER — Ambulatory Visit (INDEPENDENT_AMBULATORY_CARE_PROVIDER_SITE_OTHER): Payer: PPO | Admitting: Family Medicine

## 2021-09-10 ENCOUNTER — Other Ambulatory Visit: Payer: Self-pay

## 2021-09-10 ENCOUNTER — Encounter: Payer: Self-pay | Admitting: Family Medicine

## 2021-09-10 VITALS — BP 138/78 | HR 91 | Temp 98.2°F | Resp 16 | Ht 65.0 in | Wt 173.8 lb

## 2021-09-10 DIAGNOSIS — I10 Essential (primary) hypertension: Secondary | ICD-10-CM

## 2021-09-10 DIAGNOSIS — Z23 Encounter for immunization: Secondary | ICD-10-CM

## 2021-09-10 DIAGNOSIS — E538 Deficiency of other specified B group vitamins: Secondary | ICD-10-CM

## 2021-09-10 DIAGNOSIS — M5030 Other cervical disc degeneration, unspecified cervical region: Secondary | ICD-10-CM | POA: Diagnosis not present

## 2021-09-10 DIAGNOSIS — R739 Hyperglycemia, unspecified: Secondary | ICD-10-CM | POA: Diagnosis not present

## 2021-09-10 DIAGNOSIS — G4700 Insomnia, unspecified: Secondary | ICD-10-CM

## 2021-09-10 DIAGNOSIS — N1831 Chronic kidney disease, stage 3a: Secondary | ICD-10-CM | POA: Diagnosis not present

## 2021-09-10 DIAGNOSIS — Z1231 Encounter for screening mammogram for malignant neoplasm of breast: Secondary | ICD-10-CM | POA: Diagnosis not present

## 2021-09-10 DIAGNOSIS — E039 Hypothyroidism, unspecified: Secondary | ICD-10-CM

## 2021-09-10 DIAGNOSIS — F411 Generalized anxiety disorder: Secondary | ICD-10-CM

## 2021-09-10 DIAGNOSIS — D649 Anemia, unspecified: Secondary | ICD-10-CM

## 2021-09-10 DIAGNOSIS — E559 Vitamin D deficiency, unspecified: Secondary | ICD-10-CM | POA: Diagnosis not present

## 2021-09-10 DIAGNOSIS — N390 Urinary tract infection, site not specified: Secondary | ICD-10-CM

## 2021-09-10 DIAGNOSIS — K219 Gastro-esophageal reflux disease without esophagitis: Secondary | ICD-10-CM

## 2021-09-10 DIAGNOSIS — E785 Hyperlipidemia, unspecified: Secondary | ICD-10-CM

## 2021-09-10 DIAGNOSIS — R202 Paresthesia of skin: Secondary | ICD-10-CM

## 2021-09-10 LAB — COMPLETE METABOLIC PANEL WITHOUT GFR
AG Ratio: 1.9 (calc) (ref 1.0–2.5)
ALT: 35 U/L — ABNORMAL HIGH (ref 6–29)
AST: 36 U/L — ABNORMAL HIGH (ref 10–35)
Albumin: 4.3 g/dL (ref 3.6–5.1)
Alkaline phosphatase (APISO): 55 U/L (ref 37–153)
BUN/Creatinine Ratio: 14 (calc) (ref 6–22)
BUN: 17 mg/dL (ref 7–25)
CO2: 27 mmol/L (ref 20–32)
Calcium: 9.7 mg/dL (ref 8.6–10.4)
Chloride: 105 mmol/L (ref 98–110)
Creat: 1.23 mg/dL — ABNORMAL HIGH (ref 0.60–1.00)
Globulin: 2.3 g/dL (ref 1.9–3.7)
Glucose, Bld: 113 mg/dL — ABNORMAL HIGH (ref 65–99)
Potassium: 4.3 mmol/L (ref 3.5–5.3)
Sodium: 139 mmol/L (ref 135–146)
Total Bilirubin: 0.3 mg/dL (ref 0.2–1.2)
Total Protein: 6.6 g/dL (ref 6.1–8.1)
eGFR: 47 mL/min/{1.73_m2} — ABNORMAL LOW

## 2021-09-10 LAB — IRON,TIBC AND FERRITIN PANEL
%SAT: 13 % — ABNORMAL LOW (ref 16–45)
Ferritin: 12 ng/mL — ABNORMAL LOW (ref 16–288)
Iron: 51 ug/dL (ref 45–160)
TIBC: 397 ug/dL (ref 250–450)

## 2021-09-10 LAB — CBC WITH DIFFERENTIAL/PLATELET
Absolute Monocytes: 897 {cells}/uL (ref 200–950)
Basophils Absolute: 90 {cells}/uL (ref 0–200)
Basophils Relative: 1.3 %
Eosinophils Absolute: 200 {cells}/uL (ref 15–500)
Eosinophils Relative: 2.9 %
HCT: 36.6 % (ref 35.0–45.0)
Hemoglobin: 11.3 g/dL — ABNORMAL LOW (ref 11.7–15.5)
Lymphs Abs: 1594 {cells}/uL (ref 850–3900)
MCH: 26 pg — ABNORMAL LOW (ref 27.0–33.0)
MCHC: 30.9 g/dL — ABNORMAL LOW (ref 32.0–36.0)
MCV: 84.1 fL (ref 80.0–100.0)
MPV: 10.8 fL (ref 7.5–12.5)
Monocytes Relative: 13 %
Neutro Abs: 4119 {cells}/uL (ref 1500–7800)
Neutrophils Relative %: 59.7 %
Platelets: 321 10*3/uL (ref 140–400)
RBC: 4.35 Million/uL (ref 3.80–5.10)
RDW: 14.6 % (ref 11.0–15.0)
Total Lymphocyte: 23.1 %
WBC: 6.9 10*3/uL (ref 3.8–10.8)

## 2021-09-10 MED ORDER — NITROFURANTOIN MONOHYD MACRO 100 MG PO CAPS: 100.0000 mg | ORAL_CAPSULE | Freq: Two times a day (BID) | ORAL | 1 refills | Status: DC

## 2021-09-10 MED ORDER — SIMVASTATIN 40 MG PO TABS: 40.0000 mg | ORAL_TABLET | Freq: Every evening | ORAL | 1 refills | Status: DC

## 2021-09-10 MED ORDER — CITALOPRAM HYDROBROMIDE 20 MG PO TABS: 20.0000 mg | ORAL_TABLET | Freq: Every day | ORAL | 1 refills | Status: DC

## 2021-09-10 MED ORDER — ALPRAZOLAM 0.25 MG PO TABS: 0.2500 mg | ORAL_TABLET | Freq: Every day | ORAL | 0 refills | Status: DC | PRN

## 2021-09-10 NOTE — Progress Notes (Signed)
Name: Dawn Fuentes   MRN: [id number]    DOB: 1946/05/31   Date:09/10/2021       Progress Note  Subjective  Chief Complaint  Follow Up  HPI  GAD: she is feeling much better on Citalopram 20 mg, she takes alprazolam seldom, about 2 times a week. She is feeling more calm.   Insomnia: she takes Ambien prn    Hypothyroidism: she is taking medication as prescribed, no hair loss, palpitation or change in bowel movements . She is taking two pills M, W and Fridays and one daily. Last TSH at goal, continue current dose    DDD cervical spine with radiculitis, seen by Dr. Trenton Gammon and started having PT at [facility] last week and she has noticed improvement of frequency of headaches, she still has radiculitis going down to upper arm but only when rotating to right side. She also had a visit with Dr. Holley Raring , she had steroid injection done in 12/2020 , it relieved the headache for one day, she had a nerve block 01/2021 and also worked for only one day. She was referred by Dr. Holley Raring to Dr. Richardson Landry and she will have a CT soft tissue neck due to fullness on right side of neck and pain still radiating from right side of neck,  to anterior right ear. She tried Lyrica. She had a parotid mass removed but continues to have numbness on right side of face and headache on right temporal area, waiting to have botox. She states he tried her on Topamax but had to stop because of side effects and also developed paresthesia on left hand ( she is right hand dominant) no weakness. She thinks secondary to taking Topamax, she is willing to have NCS done to find out the cause or location of problem . She takes Robaxin prn   HTN/CKI: stage III , no pruritis, bp has been at goal, no chest pain, palpitation or SOB, last labs showed mild anemia and GFR dropped a little. We will recheck labs today   Dyslipidemia: taking simvastatin, last LDL was at goal down from 102 to 69  denies chest pain or palpitation   Hyperglycemia: A1C was up  from 5.5 % to 6.2 % , discussed life style modification   Patient Active Problem List   Diagnosis Date Noted   Neck swelling 07/06/2021   Chronic nonintractable headache 07/06/2021   Cervicogenic migraine 01/14/2021   Cervical facet joint syndrome 01/14/2021   Cervical radiculitis 12/01/2020   Bilateral hip pain 12/01/2020   Chronic bilateral low back pain without sciatica 12/01/2020   Chronic SI joint pain 12/01/2020   Chronic pain syndrome 12/01/2020   Body mass index (BMI) 30.0-30.9, adult 11/18/2020   Essential (primary) hypertension 11/18/2020   Cervical radicular pain 11/18/2020   Degeneration of cervical intervertebral disc 44/62/8638   Lichen sclerosus et atrophicus of the vulva 11/04/2019   [name] deficiency 12/26/2018   Osteoarthritis of knees, bilateral 05/26/2017   Primary open angle glaucoma of both eyes 11/21/2016   Chronic kidney disease (CKD), stage III (moderate) (HCC) 04/06/2016   Cystitis 10/12/2015   Benign hypertension 08/21/2015   Insomnia, persistent 08/21/2015   Dyslipidemia 08/21/2015   Gastro-esophageal reflux disease without esophagitis 08/21/2015   Glaucoma 08/21/2015   Blood glucose elevated 08/21/2015   Adult hypothyroidism 08/21/2015   Climacteric 08/21/2015   Senile osteoporosis 08/21/2015   Seasonal allergies 08/21/2015   Female genuine stress incontinence 08/21/2015   Vitamin D deficiency 08/21/2015    Past Surgical History:  Procedure Laterality Date   ABDOMINAL HYSTERECTOMY     21 years ago   APPENDECTOMY     COLONOSCOPY N/A 09/14/2020   Procedure: COLONOSCOPY;  Surgeon: Lesly Rubenstein, MD;  Location: [facility] ENDOSCOPY;  Service: Endoscopy;  Laterality: N/A;   COLONOSCOPY WITH PROPOFOL N/A 05/29/2015   Procedure: COLONOSCOPY WITH PROPOFOL;  Surgeon: Hulen Luster, MD;  Location: [facility] ENDOSCOPY;  Service: Gastroenterology;  Laterality: N/A;   ESOPHAGOGASTRODUODENOSCOPY N/A 05/29/2015   Procedure: ESOPHAGOGASTRODUODENOSCOPY (EGD);  Surgeon:  Hulen Luster, MD;  Location: [facility] ENDOSCOPY;  Service: Gastroenterology;  Laterality: N/A;   ESOPHAGOGASTRODUODENOSCOPY N/A 09/14/2020   Procedure: ESOPHAGOGASTRODUODENOSCOPY (EGD);  Surgeon: Lesly Rubenstein, MD;  Location: [facility] ENDOSCOPY;  Service: Endoscopy;  Laterality: N/A;   EYE SURGERY     eye lids   OVARIAN CYST REMOVAL     needed transfusion   PAROTIDECTOMY Right 05/05/2021   Procedure: SUPERFICIAL PAROTIDECTOMY;  Surgeon: Clyde Canterbury, MD;  Location: [facility] ORS;  Service: ENT;  Laterality: Right;   PHOTOCOAGULATION WITH LASER Right 11/21/2016   Procedure: PHOTOCOAGULATION WITH LASER;  Surgeon: Ronnell Freshwater, MD;  Location: [facility];  Service: Ophthalmology;  Laterality: Right;  A micropulse probe was applied to each hemilimbus with the following settings: 2011mW, 31.3% duty cycle, 90 seconds x 3 applications.    TONSILLECTOMY      Family History  Problem Relation Age of Onset   Hypertension Mother    Osteoporosis Mother    COPD Mother    Dementia Mother    Cancer Father        Colon   Hypothyroidism Daughter    Aneurysm Brother    COPD Sister     Social History   Tobacco Use   Smoking status: Never   Smokeless tobacco: Never   Tobacco comments:    smoking cessation materials not required  Substance Use Topics   Alcohol use: No    Alcohol/week: 0.0 standard drinks     Current Outpatient Medications:    ALPRAZolam (XANAX) 0.5 MG tablet, Take 1 tablet (0.5 mg total) by mouth daily as needed for anxiety. Rarely, Disp: 12 tablet, Rfl: 0   Calcium Carb-Cholecalciferol 600-800 MG-UNIT TABS, Take 1 tablet by mouth daily., Disp: , Rfl:    citalopram (CELEXA) 20 MG tablet, Take 1 tablet (20 mg total) by mouth daily., Disp: 90 tablet, Rfl: 1   clobetasol ointment (TEMOVATE) 0.05 %, Apply to affected area every night for 4 weeks, then every other day for 4 weeks and then twice a week for 4 weeks or until resolution. (Patient taking differently: Apply 1  application topically daily as needed (Vaginal). Apply to affected area every night for 4 weeks, then every other day for 4 weeks and then twice a week for 4 weeks or until resolution.), Disp: 30 g, Rfl: 5   Cyanocobalamin (VITAMIN B-12 IJ), Inject as directed every 30 (thirty) days., Disp: , Rfl:    denosumab (PROLIA) 60 MG/ML SOSY injection, Inject 60 mg into the skin every 6 (six) months., Disp: , Rfl:    hydrALAZINE (APRESOLINE) 25 MG tablet, TAKE 1 TABLET BY MOUTH THREE TIMES DAILY; IF BLOOD PRESSURE ABOVE 140/90 (Patient taking differently: Take 25 mg by mouth daily.), Disp: 270 tablet, Rfl: 0   levocetirizine (XYZAL) 5 MG tablet, Take 1 tablet (5 mg total) by mouth every evening., Disp: 90 tablet, Rfl: 1   levothyroxine (SYNTHROID) 50 MCG tablet, TAKE 2 TABLETS BY MOUTH DAILY BEFORE BREAKFAST ON MONDAY, WEDNESDAY, FRIDAY, AND  1 TABLET ON ALL OTHER DAYS OF THE WEEK, Disp: 128 tablet, Rfl: 0   losartan-hydrochlorothiazide (HYZAAR) 100-12.5 MG tablet, Take 1 tablet by mouth daily., Disp: 90 tablet, Rfl: 1   methocarbamol (ROBAXIN) 500 MG tablet, Take 1 tablet (500 mg total) by mouth every 8 (eight) hours as needed for muscle spasms., Disp: 60 tablet, Rfl: 2   Omega-3 Fatty Acids (FISH OIL) 1000 MG CAPS, Take 1,000 mg by mouth daily., Disp: , Rfl:    pantoprazole (PROTONIX) 40 MG tablet, TAKE 1 TABLET(40 MG) BY MOUTH DAILY (Patient taking differently: Take 40 mg by mouth daily.), Disp: 90 tablet, Rfl: 2   simvastatin (ZOCOR) 40 MG tablet, TAKE 1 TABLET(40 MG) BY MOUTH DAILY (Patient taking differently: Take 40 mg by mouth every evening.), Disp: 90 tablet, Rfl: 1   timolol (TIMOPTIC) 0.5 % ophthalmic solution, Place 1 drop into both eyes 2 (two) times daily., Disp: , Rfl: 5   Turmeric 500 MG TABS, Take 500 mg by mouth in the morning and at bedtime., Disp: , Rfl:    VYZULTA 0.024 % SOLN, Place 1 drop into both eyes at bedtime., Disp: , Rfl: 2   zolpidem (AMBIEN) 5 MG tablet, Take 1 tablet (5 mg total)  by mouth at bedtime., Disp: 90 tablet, Rfl: 0   HYDROcodone-acetaminophen (NORCO/VICODIN) 5-325 MG tablet, Take 1-2 tablets by mouth every 6 (six) hours as needed for moderate pain. (Patient not taking: Reported on 09/10/2021), Disp: 30 tablet, Rfl: 0   topiramate (TOPAMAX) 25 MG tablet, Take 1 tablet (25 mg total) by mouth daily. (Patient not taking: Reported on 09/10/2021), Disp: 30 tablet, Rfl: 2  Current Facility-Administered Medications:    cyanocobalamin ((VITAMIN B-12)) injection 1,000 mcg, 1,000 mcg, Intramuscular, Q30 days, Argelio Granier, MD, 1,000 mcg at 09/10/21 1357  Allergies  Allergen Reactions   Brimonidine Tartrate Other (See Comments)    Burning in eyes   Latanoprost Other (See Comments)    burning   Meloxicam     Increased blood pressure    Raloxifene     bone pain    I personally reviewed active problem list, medication list, allergies, family history, social history, health maintenance with the patient/caregiver today.   ROS  Constitutional: Negative for fever or weight change.  Respiratory: Negative for cough and shortness of breath.   Cardiovascular: Negative for chest pain or palpitations.  Gastrointestinal: Negative for abdominal pain, no bowel changes.  Musculoskeletal: Negative for gait problem or joint swelling.  Skin: Negative for rash.  Neurological: Negative for dizziness or headache.  No other specific complaints in a complete review of systems (except as listed in HPI above).   Objective  Vitals:   09/10/21 1354  BP: 138/78  Pulse: 91  Resp: 16  Temp: 98.2 F (36.8 C)  TempSrc: Oral  SpO2: 98%  Weight: 173 lb 12.8 oz (78.8 kg)  Height: 5\' 5"  (1.651 m)    Body mass index is 28.92 kg/m.  Physical Exam  Constitutional: Patient appears well-developed and well-nourished. Overweight.  No distress.  HEENT: head atraumatic, normocephalic, pupils equal and reactive to light,neck supple Cardiovascular: Normal rate, regular rhythm and  normal heart sounds.  No murmur heard. No BLE edema. Pulmonary/Chest: Effort normal and breath sounds normal. No respiratory distress. Abdominal: Soft.  There is no tenderness. Psychiatric: Patient has a normal mood and affect. behavior is normal. Judgment and thought content normal.   PHQ2/9: Depression screen [facility] 2/9 09/10/2021 07/06/2021 03/30/2021 03/10/2021 12/07/2020  Decreased Interest 0 0 0  0 0  Down, Depressed, Hopeless 0 0 0 0 0  PHQ - 2 Score 0 0 0 0 0  Altered sleeping 1 - - - 0  Tired, decreased energy 0 - - - 0  Change in appetite 0 - - - 0  Feeling bad or failure about yourself  0 - - - 0  Trouble concentrating 0 - - - 0  Moving slowly or fidgety/restless 0 - - - 0  Suicidal thoughts 0 - - - 0  PHQ-9 Score 1 - - - 0  Difficult doing work/chores Not difficult at all - - - Not difficult at all  Some recent data might be hidden    phq 9 is negative   Fall Risk: Fall Risk  09/10/2021 08/17/2021 07/06/2021 03/30/2021 03/10/2021  Falls in the past year? 0 0 0 0 0  Number falls in past yr: - - - - 0  Injury with Fall? - - - - 0  Risk for fall due to : - - - - -  Risk for fall due to: Comment - - - - -  Follow up Falls prevention discussed - - - -     Functional Status Survey: Is the patient deaf or have difficulty hearing?: No Does the patient have difficulty seeing, even when wearing glasses/contacts?: No Does the patient have difficulty concentrating, remembering, or making decisions?: No Does the patient have difficulty walking or climbing stairs?: No Does the patient have difficulty dressing or bathing?: No Does the patient have difficulty doing errands alone such as visiting a doctor's office or shopping?: No    Assessment & Plan  1. Stage 3a chronic kidney disease (HCC)  - CBC with Differential/Platelet - COMPLETE METABOLIC PANEL WITH GFR  2. Vitamin D deficiency   3. Need for immunization against influenza  - Flu Vaccine QUAD High Dose(Fluad)  4.  Vitamin [name] deficiency   5. Insomnia, persistent   6. Adult hypothyroidism  At goal   7. DDD (degenerative disc disease), cervical   8. Breast cancer screening by mammogram  - MM Digital Screening; Future  9. Dyslipidemia  - simvastatin (ZOCOR) 40 MG tablet; Take 1 tablet (40 mg total) by mouth every evening.  Dispense: 90 tablet; Refill: 1  10. Gastro-esophageal reflux disease without esophagitis   11. Hyperglycemia  Discussed change in diet   12. Benign hypertension   13. GAD (generalized anxiety disorder)  - ALPRAZolam (XANAX) 0.25 MG tablet; Take 1 tablet (0.25 mg total) by mouth daily as needed for anxiety. Rarely  Dispense: 30 tablet; Refill: 0 - citalopram (CELEXA) 20 MG tablet; Take 1 tablet (20 mg total) by mouth daily.  Dispense: 90 tablet; Refill: 1  14. Anemia, unspecified type  - CBC with Differential/Platelet - Iron, TIBC and Ferritin Panel  15. Paresthesias in left hand  - NCV with EMG(electromyography); Future  16. Recurrent UTI  - nitrofurantoin, macrocrystal-monohydrate, (MACROBID) 100 MG capsule; Take 1 capsule (100 mg total) by mouth 2 (two) times daily.  Dispense: 10 capsule; Refill: 1

## 2021-09-11 ENCOUNTER — Other Ambulatory Visit: Payer: Self-pay | Admitting: Family Medicine

## 2021-09-11 DIAGNOSIS — I10 Essential (primary) hypertension: Secondary | ICD-10-CM

## 2021-09-11 NOTE — Telephone Encounter (Signed)
Future OV 09/14/21  Approved per protocol.

## 2021-09-14 ENCOUNTER — Ambulatory Visit: Payer: PPO

## 2021-09-16 ENCOUNTER — Other Ambulatory Visit: Payer: Self-pay

## 2021-09-16 ENCOUNTER — Ambulatory Visit (INDEPENDENT_AMBULATORY_CARE_PROVIDER_SITE_OTHER): Payer: PPO

## 2021-09-16 ENCOUNTER — Encounter: Payer: Self-pay | Admitting: Neurology

## 2021-09-16 ENCOUNTER — Telehealth: Payer: Self-pay | Admitting: Family Medicine

## 2021-09-16 DIAGNOSIS — R202 Paresthesia of skin: Secondary | ICD-10-CM

## 2021-09-16 DIAGNOSIS — Z0000 Encounter for general adult medical examination without abnormal findings: Secondary | ICD-10-CM | POA: Diagnosis not present

## 2021-09-16 DIAGNOSIS — J302 Other seasonal allergic rhinitis: Secondary | ICD-10-CM

## 2021-09-16 MED ORDER — LEVOCETIRIZINE DIHYDROCHLORIDE 5 MG PO TABS: 5.0000 mg | ORAL_TABLET | Freq: Every evening | ORAL | 1 refills | Status: DC

## 2021-09-16 NOTE — Patient Instructions (Signed)
[name] , Thank you for taking time to come for your Medicare Wellness Visit. I appreciate your ongoing commitment to your health goals. Please review the following plan we discussed and let me know if I can assist you in the future.   Screening recommendations/referrals: Colonoscopy: done 09/14/20. Repeat 08/2025 Mammogram: done 10/20/20 Bone Density: done 11/03/20 Recommended yearly ophthalmology/optometry visit for glaucoma screening and checkup Recommended yearly dental visit for hygiene and checkup  Vaccinations: Influenza vaccine: done 09/10/21 Pneumococcal vaccine: done 03/08/17 Tdap vaccine: due Shingles vaccine: done 07/12/18 & 11/20/18   Covid-19:done 01/31/20, 02/21/20 & 12/29/20  Advanced directives: Please bring a copy of your health care power of attorney and living will to the office at your convenience.   Conditions/risks identified: Recommend increasing physical activity as tolerated  Next appointment: Follow up in one year for your annual wellness visit    Preventive Care 65 Years and Older, Female Preventive care refers to lifestyle choices and visits with your health care provider that can promote health and wellness. What does preventive care include? A yearly physical exam. This is also called an annual well check. Dental exams once or twice a year. Routine eye exams. Ask your health care provider how often you should have your eyes checked. Personal lifestyle choices, including: Daily care of your teeth and gums. Regular physical activity. Eating a healthy diet. Avoiding tobacco and drug use. Limiting alcohol use. Practicing safe sex. Taking low-dose aspirin every day. Taking vitamin and mineral supplements as recommended by your health care provider. What happens during an annual well check? The services and screenings done by your health care provider during your annual well check will depend on your age, overall health, lifestyle risk factors, and family  history of disease. Counseling  Your health care provider may ask you questions about your: Alcohol use. Tobacco use. Drug use. Emotional well-being. Home and relationship well-being. Sexual activity. Eating habits. History of falls. Memory and ability to understand (cognition). Work and work Statistician. Reproductive health. Screening  You may have the following tests or measurements: Height, weight, and BMI. Blood pressure. Lipid and cholesterol levels. These may be checked every 5 years, or more frequently if you are over 28 years old. Skin check. Lung cancer screening. You may have this screening every year starting at age 78 if you have a 30-pack-year history of smoking and currently smoke or have quit within the past 15 years. Fecal occult blood test (FOBT) of the stool. You may have this test every year starting at age 10. Flexible sigmoidoscopy or colonoscopy. You may have a sigmoidoscopy every 5 years or a colonoscopy every 10 years starting at age 82. Hepatitis C blood test. Hepatitis B blood test. Sexually transmitted disease (STD) testing. Diabetes screening. This is done by checking your blood sugar (glucose) after you have not eaten for a while (fasting). You may have this done every 1-3 years. Bone density scan. This is done to screen for osteoporosis. You may have this done starting at age 90. Mammogram. This may be done every 1-2 years. Talk to your health care provider about how often you should have regular mammograms. Talk with your health care provider about your test results, treatment options, and if necessary, the need for more tests. Vaccines  Your health care provider may recommend certain vaccines, such as: Influenza vaccine. This is recommended every year. Tetanus, diphtheria, and acellular pertussis (Tdap, Td) vaccine. You may need a Td booster every 10 years. Zoster vaccine. You may need  this after age 73. Pneumococcal 13-valent conjugate (PCV13)  vaccine. One dose is recommended after age 44. Pneumococcal polysaccharide (PPSV23) vaccine. One dose is recommended after age 1. Talk to your health care provider about which screenings and vaccines you need and how often you need them. This information is not intended to replace advice given to you by your health care provider. Make sure you discuss any questions you have with your health care provider. Document Released: 01/01/2016 Document Revised: 08/24/2016 Document Reviewed: 10/06/2015 Elsevier Interactive Patient Education  2017 [employer] Prevention in the Home Falls can cause injuries. They can happen to people of all ages. There are many things you can do to make your home safe and to help prevent falls. What can I do on the outside of my home? Regularly fix the edges of walkways and driveways and fix any cracks. Remove anything that might make you trip as you walk through a door, such as a raised step or threshold. Trim any bushes or trees on the path to your home. Use bright outdoor lighting. Clear any walking paths of anything that might make someone trip, such as rocks or tools. Regularly check to see if handrails are loose or broken. Make sure that both sides of any steps have handrails. Any raised decks and porches should have guardrails on the edges. Have any leaves, snow, or ice cleared regularly. Use sand or salt on walking paths during winter. Clean up any spills in your garage right away. This includes oil or grease spills. What can I do in the bathroom? Use night lights. Install grab bars by the toilet and in the tub and shower. Do not use towel bars as grab bars. Use non-skid mats or decals in the tub or shower. If you need to sit down in the shower, use a plastic, non-slip stool. Keep the floor dry. Clean up any water that spills on the floor as soon as it happens. Remove soap buildup in the tub or shower regularly. Attach bath mats securely with  double-sided non-slip rug tape. Do not have throw rugs and other things on the floor that can make you trip. What can I do in the bedroom? Use night lights. Make sure that you have a light by your bed that is easy to reach. Do not use any sheets or blankets that are too big for your bed. They should not hang down onto the floor. Have a firm chair that has side arms. You can use this for support while you get dressed. Do not have throw rugs and other things on the floor that can make you trip. What can I do in the kitchen? Clean up any spills right away. Avoid walking on wet floors. Keep items that you use a lot in easy-to-reach places. If you need to reach something above you, use a strong step stool that has a grab bar. Keep electrical cords out of the way. Do not use floor polish or wax that makes floors slippery. If you must use wax, use non-skid floor wax. Do not have throw rugs and other things on the floor that can make you trip. What can I do with my stairs? Do not leave any items on the stairs. Make sure that there are handrails on both sides of the stairs and use them. Fix handrails that are broken or loose. Make sure that handrails are as long as the stairways. Check any carpeting to make sure that it is firmly attached to  the stairs. Fix any carpet that is loose or worn. Avoid having throw rugs at the top or bottom of the stairs. If you do have throw rugs, attach them to the floor with carpet tape. Make sure that you have a light switch at the top of the stairs and the bottom of the stairs. If you do not have them, ask someone to add them for you. What else can I do to help prevent falls? Wear shoes that: Do not have high heels. Have rubber bottoms. Are comfortable and fit you well. Are closed at the toe. Do not wear sandals. If you use a stepladder: Make sure that it is fully opened. Do not climb a closed stepladder. Make sure that both sides of the stepladder are locked  into place. Ask someone to hold it for you, if possible. Clearly mark and make sure that you can see: Any grab bars or handrails. First and last steps. Where the edge of each step is. Use tools that help you move around (mobility aids) if they are needed. These include: Canes. Walkers. Scooters. Crutches. Turn on the lights when you go into a dark area. Replace any light bulbs as soon as they burn out. Set up your furniture so you have a clear path. Avoid moving your furniture around. If any of your floors are uneven, fix them. If there are any pets around you, be aware of where they are. Review your medicines with your doctor. Some medicines can make you feel dizzy. This can increase your chance of falling. Ask your doctor what other things that you can do to help prevent falls. This information is not intended to replace advice given to you by your health care provider. Make sure you discuss any questions you have with your health care provider. Document Released: 10/01/2009 Document Revised: 05/12/2016 Document Reviewed: 01/09/2015 Elsevier Interactive Patient Education  2017 [employer].

## 2021-09-16 NOTE — Progress Notes (Signed)
Subjective:   Dawn Fuentes is a 75 y.o. female who presents for Medicare Annual (Subsequent) preventive examination.  Virtual Visit via Telephone Note  I connected with  Dawn Fuentes on 09/16/21 at  9:20 AM EDT by telephone and verified that I am speaking with the correct person using two identifiers.  Location: Patient: home Provider: [place] Persons participating in the virtual visit: [place]   I discussed the limitations, risks, security and privacy concerns of performing an evaluation and management service by telephone and the availability of in person appointments. The patient expressed understanding and agreed to proceed.  Interactive audio and video telecommunications were attempted between this nurse and patient, however failed, due to patient having technical difficulties OR patient did not have access to video capability.  We continued and completed visit with audio only.  Some vital signs may be absent or patient reported.   Clemetine Marker, LPN   Review of Systems     Cardiac Risk Factors include: advanced age (>17men, >26 women);hypertension;dyslipidemia     Objective:    There were no vitals filed for this visit. There is no height or weight on file to calculate BMI.  Advanced Directives 09/16/2021 07/06/2021 05/05/2021 04/30/2021 03/30/2021 01/25/2021 12/01/2020  Does Patient Have a Medical Advance Directive? Yes No No Yes Yes Yes Yes  Type of Paramedic of [place];Living will - - [place];Living will [place];Living will - [place];Living will  Does patient want to make changes to medical advance directive? - - - - - - -  Copy of [place] in Chart? No - copy requested - No - copy requested No - copy requested - - No - copy requested  Would patient like information on creating a medical advance directive? - - No - Patient declined - - - -     Current Medications (verified) Outpatient Encounter Medications as of 09/16/2021  Medication Sig   ALPRAZolam (XANAX) 0.25 MG tablet Take 1 tablet (0.25 mg total) by mouth daily as needed for anxiety. Rarely   Calcium Carb-Cholecalciferol 600-800 MG-UNIT TABS Take 1 tablet by mouth daily.   citalopram (CELEXA) 20 MG tablet Take 1 tablet (20 mg total) by mouth daily.   clobetasol ointment (TEMOVATE) 0.05 % Apply to affected area every night for 4 weeks, then every other day for 4 weeks and then twice a week for 4 weeks or until resolution. (Patient taking differently: Apply 1 application topically daily as needed (Vaginal). Apply to affected area every night for 4 weeks, then every other day for 4 weeks and then twice a week for 4 weeks or until resolution.)   Cyanocobalamin (VITAMIN B-12 IJ) Inject as directed every 30 (thirty) days.   denosumab (PROLIA) 60 MG/ML SOSY injection Inject 60 mg into the skin every 6 (six) months.   hydrALAZINE (APRESOLINE) 25 MG tablet TAKE 1 TABLET BY MOUTH THREE TIMES DAILY; IF BLOOD PRESSURE ABOVE 140/90 (Patient taking differently: Take 25 mg by mouth daily.)   levocetirizine (XYZAL) 5 MG tablet Take 1 tablet (5 mg total) by mouth every evening.   levothyroxine (SYNTHROID) 50 MCG tablet TAKE 2 TABLETS BY MOUTH DAILY BEFORE BREAKFAST ON MONDAY, WEDNESDAY, FRIDAY, AND 1 TABLET ON ALL OTHER DAYS OF THE WEEK   losartan-hydrochlorothiazide (HYZAAR) 100-12.5 MG tablet TAKE 1 TABLET BY MOUTH DAILY   methocarbamol (ROBAXIN) 500 MG tablet Take 1 tablet (500 mg total) by mouth every 8 (eight)  hours as needed for muscle spasms.   nitrofurantoin, macrocrystal-monohydrate, (MACROBID) 100 MG capsule Take 1 capsule (100 mg total) by mouth 2 (two) times daily.   Omega-3 Fatty Acids (FISH OIL) 1000 MG CAPS Take 1,000 mg by mouth daily.   pantoprazole (PROTONIX) 40 MG tablet TAKE 1 TABLET(40 MG) BY MOUTH DAILY (Patient taking differently: Take 40 mg by mouth daily.)    simvastatin (ZOCOR) 40 MG tablet Take 1 tablet (40 mg total) by mouth every evening.   timolol (TIMOPTIC) 0.5 % ophthalmic solution Place 1 drop into both eyes 2 (two) times daily.   Turmeric 500 MG TABS Take 500 mg by mouth in the morning and at bedtime.   VYZULTA 0.024 % SOLN Place 1 drop into both eyes at bedtime.   zolpidem (AMBIEN) 5 MG tablet Take 1 tablet (5 mg total) by mouth at bedtime.   Facility-Administered Encounter Medications as of 09/16/2021  Medication   cyanocobalamin ((VITAMIN B-12)) injection 1,000 mcg    Allergies (verified) Brimonidine tartrate, Latanoprost, Meloxicam, and Raloxifene   History: Past Medical History:  Diagnosis Date   Allergy    Anxiety    ER visit in Sept 2017 for anxiety   Arthritis    Benign parotid tumor    Chronic insomnia    Colon adenoma    Dyslipidemia    Dysphagia    Female stress incontinence    GERD (gastroesophageal reflux disease)    Glaucoma, both eyes    Dr. Wallace Going- [facility]   Headache    Hyperlipidemia    Hypertension    controlled on meds   Hypothyroidism    Menopause syndrome    Osteoporosis    Polyp of colon    Renal insufficiency    mild- keeping an eye on   Vitamin D deficiency    Past Surgical History:  Procedure Laterality Date   ABDOMINAL HYSTERECTOMY     21 years ago   APPENDECTOMY     COLONOSCOPY N/A 09/14/2020   Procedure: COLONOSCOPY;  Surgeon: Lesly Rubenstein, MD;  Location: [facility] ENDOSCOPY;  Service: Endoscopy;  Laterality: N/A;   COLONOSCOPY WITH PROPOFOL N/A 05/29/2015   Procedure: COLONOSCOPY WITH PROPOFOL;  Surgeon: Hulen Luster, MD;  Location: [facility] ENDOSCOPY;  Service: Gastroenterology;  Laterality: N/A;   ESOPHAGOGASTRODUODENOSCOPY N/A 05/29/2015   Procedure: ESOPHAGOGASTRODUODENOSCOPY (EGD);  Surgeon: Hulen Luster, MD;  Location: [facility] ENDOSCOPY;  Service: Gastroenterology;  Laterality: N/A;   ESOPHAGOGASTRODUODENOSCOPY N/A 09/14/2020   Procedure: ESOPHAGOGASTRODUODENOSCOPY (EGD);   Surgeon: Lesly Rubenstein, MD;  Location: [facility] ENDOSCOPY;  Service: Endoscopy;  Laterality: N/A;   EYE SURGERY     eye lids   OVARIAN CYST REMOVAL     needed transfusion   PAROTIDECTOMY Right 05/05/2021   Procedure: SUPERFICIAL PAROTIDECTOMY;  Surgeon: Clyde Canterbury, MD;  Location: [facility] ORS;  Service: ENT;  Laterality: Right;   PHOTOCOAGULATION WITH LASER Right 11/21/2016   Procedure: PHOTOCOAGULATION WITH LASER;  Surgeon: Ronnell Freshwater, MD;  Location: [place];  Service: Ophthalmology;  Laterality: Right;  A micropulse probe was applied to each hemilimbus with the following settings: 2089mW, 31.3% duty cycle, 90 seconds x 3 applications.    TONSILLECTOMY     Family History  Problem Relation Age of Onset   Hypertension Mother    Osteoporosis Mother    COPD Mother    Dementia Mother    Cancer Father        Colon   Hypothyroidism Daughter    Aneurysm Brother  COPD Sister    Social History   Socioeconomic History   Marital status: Widowed    Spouse name: Shanon Brow   Number of children: 1   Years of education: Not on file   Highest education level: 12th grade  Occupational History   Occupation: Retired  Tobacco Use   Smoking status: Never   Smokeless tobacco: Never   Tobacco comments:    smoking cessation materials not required  Vaping Use   Vaping Use: Never used  Substance and Sexual Activity   Alcohol use: No    Alcohol/week: 0.0 standard drinks   Drug use: No   Sexual activity: Yes    Partners: Male    Birth control/protection: Post-menopausal  Other Topics Concern   Not on file  Social History Narrative   Widow since 2013   Started to date in 2019   Social Determinants of Health   Financial Resource Strain: Low Risk    Difficulty of Paying Living Expenses: Not hard at all  Food Insecurity: No Food Insecurity   Worried About Charity fundraiser in the Last Year: Never true   Arboriculturist in the Last Year: Never true  Transportation  Needs: No Transportation Needs   Lack of Transportation (Medical): No   Lack of Transportation (Non-Medical): No  Physical Activity: Inactive   Days of Exercise per Week: 0 days   Minutes of Exercise per Session: 0 min  Stress: No Stress Concern Present   Feeling of Stress : Not at all  Social Connections: Moderately Integrated   Frequency of Communication with Friends and Family: More than three times a week   Frequency of Social Gatherings with Friends and Family: More than three times a week   Attends Religious Services: More than 4 times per year   Active Member of [employer] or Organizations: Yes   Attends Archivist Meetings: More than 4 times per year   Marital Status: Widowed    Tobacco Counseling Counseling given: Not Answered Tobacco comments: smoking cessation materials not required   Clinical Intake:  Pre-visit preparation completed: Yes  Pain : No/denies pain     Nutritional Risks: None Diabetes: No  How often do you need to have someone help you when you read instructions, pamphlets, or other written materials from your doctor or pharmacy?: 1 - Never   Interpreter Needed?: No  Information entered by :: Clemetine Marker LPN   Activities of Daily Living In your present state of health, do you have any difficulty performing the following activities: 09/16/2021 09/10/2021  Hearing? N N  Vision? N N  Difficulty concentrating or making decisions? N N  Walking or climbing stairs? N N  Dressing or bathing? N N  Doing errands, shopping? N N  Preparing Food and eating ? N -  Using the Toilet? N -  In the past six months, have you accidently leaked urine? Y -  Comment wears pads for protection -  Do you have problems with loss of bowel control? N -  Managing your Medications? N -  Managing your Finances? N -  Housekeeping or managing your Housekeeping? N -  Some recent data might be hidden    Patient Care Team: Steele Sizer, MD as PCP - General (Family  Medicine)  Indicate any recent Medical Services you may have received from other than [facility] providers in the past year (date may be approximate).     Assessment:   This is a routine wellness examination for  Dawn Fuentes.  Hearing/Vision screen Hearing Screening - Comments::  Pt denies hearing difficulty Vision Screening - Comments:: Annual vision screenings done at [facility] Dr. Wallace Going  Dietary issues and exercise activities discussed: Current Exercise Habits: The patient does not participate in regular exercise at present, Exercise limited by: neurologic condition(s)   Goals Addressed             This Visit's Progress    DIET - INCREASE WATER INTAKE   On track    Recommend to drink at least 6-8 8oz glasses of water per day.     Increase physical activity   Not on track    Recommend increasing physical activity to at least 3 days per week       Depression Screen PHQ 2/9 Scores 09/16/2021 09/10/2021 07/06/2021 03/30/2021 03/10/2021 12/07/2020 12/01/2020  PHQ - 2 Score 0 0 0 0 0 0 0  PHQ- 9 Score - 1 - - - 0 -    Fall Risk Fall Risk  09/16/2021 09/10/2021 08/17/2021 07/06/2021 03/30/2021  Falls in the past year? 0 0 0 0 0  Number falls in past yr: 0 - - - -  Injury with Fall? 0 - - - -  Risk for fall due to : No Fall Risks - - - -  Risk for fall due to: Comment - - - - -  Follow up Falls prevention discussed Falls prevention discussed - - -    FALL RISK PREVENTION PERTAINING TO THE HOME:  Any stairs in or around the home? Yes  If so, are there any without handrails? No  Home free of loose throw rugs in walkways, pet beds, electrical cords, etc? Yes  Adequate lighting in your home to reduce risk of falls? Yes   ASSISTIVE DEVICES UTILIZED TO PREVENT FALLS:  Life alert? No  Use of a cane, walker or w/c? No  Grab bars in the bathroom? No  Shower chair or bench in shower? Yes  Elevated toilet seat or a handicapped toilet? No   TIMED UP AND GO:  Was the test  performed? No . Telephonic visit.   Cognitive Function: Normal cognitive status assessed by direct observation by this Nurse Health Advisor. No abnormalities found.       6CIT Screen 08/29/2019 03/13/2018  What Year? 0 points 0 points  What month? 0 points 0 points  What time? 0 points 0 points  Count back from 20 0 points 0 points  Months in reverse 0 points 0 points  Repeat phrase 0 points 0 points  Total Score 0 0    Immunizations Immunization History  Administered Date(s) Administered   Fluad Quad(high Dose 65+) 08/23/2019, 09/04/2020, 09/10/2021   Influenza Split 10/02/2008, 08/18/2010, 09/20/2012   Influenza, High Dose Seasonal PF 08/25/2015, 09/07/2016, 09/06/2017, 09/11/2018   Influenza, Seasonal, Injecte, Preservative Fre 10/09/2014   Influenza-Unspecified 10/09/2014   PFIZER(Purple Top)SARS-COV-2 Vaccination 01/31/2020, 02/21/2020, 12/29/2020   Pneumococcal Conjugate-13 08/18/2010, 03/08/2017   Pneumococcal Polysaccharide-23 12/30/2013   Pneumococcal-Unspecified 08/18/2010   Td 08/18/2010   Tdap 08/18/2010   Zoster Recombinat (Shingrix) 07/12/2018, 11/20/2018   Zoster, Live 07/24/2012    TDAP status: Due, Education has been provided regarding the importance of this vaccine. Advised may receive this vaccine at local pharmacy or Health Dept. Aware to provide a copy of the vaccination record if obtained from local pharmacy or Health Dept. Verbalized acceptance and understanding.  Flu Vaccine status: Up to date  Pneumococcal vaccine status: Completed during today's visit.  Covid-19 vaccine  status: Completed vaccines  Qualifies for Shingles Vaccine? Yes   Zostavax completed Yes   Shingrix Completed?: Yes  Screening Tests Health Maintenance  Topic Date Due   COVID-19 Vaccine (4 - Booster for [place] series) 09/26/2021 (Originally 04/28/2021)   TETANUS/TDAP  11/05/2021 (Originally 08/18/2020)   MAMMOGRAM  10/20/2021   COLONOSCOPY (Pts 45-10yrs Insurance coverage will  need to be confirmed)  09/14/2025   INFLUENZA VACCINE  Completed   DEXA SCAN  Completed   Hepatitis C Screening  Completed   Zoster Vaccines- Shingrix  Completed   HPV VACCINES  Aged Out    Health Maintenance  There are no preventive care reminders to display for this patient.   Colorectal cancer screening: Type of screening: Colonoscopy. Completed 09/14/20. Repeat every 5 years  Mammogram status: Completed 112/21. Repeat every year. Pt to contact [place] to schedule.   Bone Density status: Completed 11/03/20. Results reflect: Bone density results: OSTEOPOROSIS. Repeat every 2 years. On prolia.   Lung Cancer Screening: (Low Dose CT Chest recommended if Age 72-80 years, 30 pack-year currently smoking OR have quit w/in 15years.) does not qualify.  Additional Screening:  Hepatitis C Screening: does qualify; Completed 02/14/13  Vision Screening: Recommended annual ophthalmology exams for early detection of glaucoma and other disorders of the eye. Is the patient up to date with their annual eye exam?  Yes  Who is the provider or what is the name of the office in which the patient attends annual eye exams? [facility].   Dental Screening: Recommended annual dental exams for proper oral hygiene  Community Resource Referral / Chronic Care Management: CRR required this visit?  No   CCM required this visit?  No      Plan:     I have personally reviewed and noted the following in the patient's chart:   Medical and social history Use of alcohol, tobacco or illicit drugs  Current medications and supplements including opioid prescriptions.  Functional ability and status Nutritional status Physical activity Advanced directives List of other physicians Hospitalizations, surgeries, and ER visits in previous 12 months Vitals Screenings to include cognitive, depression, and falls Referrals and appointments  In addition, I have reviewed and discussed with  patient certain preventive protocols, quality metrics, and best practice recommendations. A written personalized care plan for preventive services as well as general preventive health recommendations were provided to patient.     Clemetine Marker, LPN   1/61/0960   Nurse Notes: pt states she is awaiting appt for NCV EMG ordered by Dr. Ancil Boozer on 09/10/21. Pt advised would notifiy provider.   Pt also c/o sore throat starting yesterday; negative covid test at home today and plans to try allergy medication.   Pt picked up hemoccult cards and plans to complete week after next.

## 2021-09-16 NOTE — Telephone Encounter (Signed)
Next appt is 03/11/21

## 2021-09-16 NOTE — Telephone Encounter (Signed)
Medication Refill - Medication: Xyzal  Has the patient contacted their pharmacy? Yes.  Pt states that the pharmacy did not have this medication on file. Please advise.  (Agent: If no, request that the patient contact the pharmacy for the refill.) (Agent: If yes, when and what did the pharmacy advise?)  Preferred Pharmacy (with phone number or street name):  [facility] [place] [place] - [address]  [place] [address]  Phone: [phone] Fax: [phone]  Hours: Not open 24 hours   Has the patient been seen for an appointment in the last year OR does the patient have an upcoming appointment? Yes.    Agent: Please be advised that RX refills may take up to 3 business days. We ask that you follow-up with your pharmacy.

## 2021-09-22 NOTE — Chronic Care Management (AMB) (Signed)
  Chronic Care Management   Outreach Note  09/22/2021 Name: Dawn Fuentes MRN: [id number] DOB: 11-27-1946  JESSALYN HINOJOSA is a 75 y.o. year old female who is a primary care patient of Steele Sizer, MD. I reached out to [name] by phone today in response to a referral sent by Ms. Alycia Patten Schoneman's primary care provider.  A second unsuccessful telephone outreach was attempted today. The patient was referred to the case management team for assistance with care management and care coordination.   Follow Up Plan: A HIPAA compliant phone message was left for the patient providing contact information and requesting a return call.  If patient returns call to provider office, please advise to call Embedded Care Management Care Guide Aleanna Menge at [place], [place] Management  Direct Dial: [phone]

## 2021-10-01 ENCOUNTER — Telehealth: Payer: Self-pay

## 2021-10-01 ENCOUNTER — Ambulatory Visit (INDEPENDENT_AMBULATORY_CARE_PROVIDER_SITE_OTHER): Payer: PPO

## 2021-10-01 DIAGNOSIS — Z1211 Encounter for screening for malignant neoplasm of colon: Secondary | ICD-10-CM

## 2021-10-01 LAB — POC HEMOCCULT BLD/STL (HOME/3-CARD/SCREEN)
Card #1 Date: 10112022
Card #2 Date: 10122022
Card #2 Fecal Occult Blod, POC: NEGATIVE
Card #3 Date: 10132022
Card #3 Fecal Occult Blood, POC: NEGATIVE
Fecal Occult Blood, POC: NEGATIVE

## 2021-10-01 NOTE — Telephone Encounter (Signed)
Patient has a stool card in the lab dropped off today 10-01-2021 @ 1:25pm.

## 2021-10-07 NOTE — Chronic Care Management (AMB) (Signed)
  Chronic Care Management   Outreach Note  10/07/2021 Name: Dawn Fuentes MRN: [id number] DOB: May 20, 1946  Dawn Fuentes is a 75 y.o. year old female who is a primary care patient of Steele Sizer, MD. I reached out to [name] by phone today in response to a referral sent by Ms. Alycia Patten [place]au's primary care provider.  Third unsuccessful telephone outreach was attempted today. The patient was referred to the case management team for assistance with care management and care coordination. The patient's primary care provider has been notified of our unsuccessful attempts to make or maintain contact with the patient. The care management team is pleased to engage with this patient at any time in the future should he/she be interested in assistance from the care management team.   Follow Up Plan: We have been unable to make contact with the patient for follow up.    Julian Hy, [place] Management  Direct Dial: [phone]

## 2021-10-16 ENCOUNTER — Other Ambulatory Visit: Payer: Self-pay

## 2021-10-16 ENCOUNTER — Encounter: Payer: Self-pay | Admitting: Emergency Medicine

## 2021-10-16 ENCOUNTER — Ambulatory Visit
Admission: EM | Admit: 2021-10-16 | Discharge: 2021-10-16 | Disposition: A | Discharge status: Home / Self Care | Payer: PPO | Attending: Physician Assistant | Admitting: Physician Assistant

## 2021-10-16 DIAGNOSIS — Z20822 Contact with and (suspected) exposure to covid-19: Secondary | ICD-10-CM | POA: Diagnosis not present

## 2021-10-16 DIAGNOSIS — R0981 Nasal congestion: Secondary | ICD-10-CM | POA: Diagnosis not present

## 2021-10-16 DIAGNOSIS — R051 Acute cough: Secondary | ICD-10-CM | POA: Diagnosis present

## 2021-10-16 DIAGNOSIS — J069 Acute upper respiratory infection, unspecified: Secondary | ICD-10-CM | POA: Diagnosis not present

## 2021-10-16 NOTE — Discharge Instructions (Signed)

## 2021-10-16 NOTE — ED Triage Notes (Signed)
Pt c/o cough and sputum production, post nasal drip. Started 3 days ago.  Denies fever.

## 2021-10-16 NOTE — ED Provider Notes (Signed)
[facility]    CSN: [id number] Arrival date & time: 10/16/21  1543      History   Chief Complaint Chief Complaint  Patient presents with   Cough    HPI Dawn Fuentes is a 75 y.o. female presenting with 3-day history of fatigue, nasal congestion and cough productive of yellowish phlegm.  Patient also admits to postnasal drip.  She denies fever, body aches, sore throat, chest pain or breathing difficulty.  No sick contacts or known exposure to influenza or COVID-19.  Denies history of COVID-19.  She is concerned about possible sinus infection and/or bronchitis.  She has no history of COPD or asthma.  Patient has taken over-the-counter Mucinex DM for symptoms and says it has helped to break up the mucus.  Medical history significant for hypertension, hyperlipidemia, hypothyroidism, mild renal insufficiency, anxiety and GERD.  HPI  Past Medical History:  Diagnosis Date   Allergy    Anxiety    ER visit in Sept 2017 for anxiety   Arthritis    Benign parotid tumor    Chronic insomnia    Colon adenoma    Dyslipidemia    Dysphagia    Female stress incontinence    GERD (gastroesophageal reflux disease)    Glaucoma, both eyes    Dr. Wallace Going- [facility]   Headache    Hyperlipidemia    Hypertension    controlled on meds   Hypothyroidism    Menopause syndrome    Osteoporosis    Polyp of colon    Renal insufficiency    mild- keeping an eye on   Vitamin D deficiency     Patient Active Problem List   Diagnosis Date Noted   Neck swelling 07/06/2021   Chronic nonintractable headache 07/06/2021   Cervicogenic migraine 01/14/2021   Cervical facet joint syndrome 01/14/2021   Cervical radiculitis 12/01/2020   Bilateral hip pain 12/01/2020   Chronic bilateral low back pain without sciatica 12/01/2020   Chronic SI joint pain 12/01/2020   Chronic pain syndrome 12/01/2020   Body mass index (BMI) 30.0-30.9, adult 11/18/2020   Essential (primary)  hypertension 11/18/2020   Cervical radicular pain 11/18/2020   Degeneration of cervical intervertebral disc 40/98/1191   Lichen sclerosus et atrophicus of the vulva 11/04/2019   [name] deficiency 12/26/2018   Osteoarthritis of knees, bilateral 05/26/2017   Primary open angle glaucoma of both eyes 11/21/2016   Chronic kidney disease (CKD), stage III (moderate) ([place]) 04/06/2016   Cystitis 10/12/2015   Benign hypertension 08/21/2015   Insomnia, persistent 08/21/2015   Dyslipidemia 08/21/2015   Gastro-esophageal reflux disease without esophagitis 08/21/2015   Glaucoma 08/21/2015   Blood glucose elevated 08/21/2015   Adult hypothyroidism 08/21/2015   Climacteric 08/21/2015   Senile osteoporosis 08/21/2015   Seasonal allergies 08/21/2015   Female genuine stress incontinence 08/21/2015   Vitamin D deficiency 08/21/2015    Past Surgical History:  Procedure Laterality Date   ABDOMINAL HYSTERECTOMY     21 years ago   APPENDECTOMY     COLONOSCOPY N/A 09/14/2020   Procedure: COLONOSCOPY;  Surgeon: Lesly Rubenstein, MD;  Location: [facility] ENDOSCOPY;  Service: Endoscopy;  Laterality: N/A;   COLONOSCOPY WITH PROPOFOL N/A 05/29/2015   Procedure: COLONOSCOPY WITH PROPOFOL;  Surgeon: Hulen Luster, MD;  Location: [facility] ENDOSCOPY;  Service: Gastroenterology;  Laterality: N/A;   ESOPHAGOGASTRODUODENOSCOPY N/A 05/29/2015   Procedure: ESOPHAGOGASTRODUODENOSCOPY (EGD);  Surgeon: Hulen Luster, MD;  Location: [facility] ENDOSCOPY;  Service: Gastroenterology;  Laterality: N/A;  ESOPHAGOGASTRODUODENOSCOPY N/A 09/14/2020   Procedure: ESOPHAGOGASTRODUODENOSCOPY (EGD);  Surgeon: Lesly Rubenstein, MD;  Location: [facility] ENDOSCOPY;  Service: Endoscopy;  Laterality: N/A;   EYE SURGERY     eye lids   OVARIAN CYST REMOVAL     needed transfusion   PAROTIDECTOMY Right 05/05/2021   Procedure: SUPERFICIAL PAROTIDECTOMY;  Surgeon: Clyde Canterbury, MD;  Location: [facility] ORS;  Service: ENT;  Laterality: Right;   PHOTOCOAGULATION WITH LASER  Right 11/21/2016   Procedure: PHOTOCOAGULATION WITH LASER;  Surgeon: Ronnell Freshwater, MD;  Location: [facility];  Service: Ophthalmology;  Laterality: Right;  A micropulse probe was applied to each hemilimbus with the following settings: 2022mW, 31.3% duty cycle, 90 seconds x 3 applications.    TONSILLECTOMY      OB History     Gravida  1   Para  1   Term  1   Preterm      AB      Living  1      SAB      IAB      Ectopic      Multiple      Live Births               Home Medications    Prior to Admission medications   Medication Sig Start Date End Date Taking? Authorizing Provider  ALPRAZolam (XANAX) 0.25 MG tablet Take 1 tablet (0.25 mg total) by mouth daily as needed for anxiety. Rarely 09/10/21  Yes Sowles, Drue Stager, MD  Calcium Carb-Cholecalciferol 600-800 MG-UNIT TABS Take 1 tablet by mouth daily.   Yes [provider]  citalopram (CELEXA) 20 MG tablet Take 1 tablet (20 mg total) by mouth daily. 09/10/21  Yes Sowles, Drue Stager, MD  Cyanocobalamin (VITAMIN B-12 IJ) Inject as directed every 30 (thirty) days.   Yes [provider]  denosumab (PROLIA) 60 MG/ML SOSY injection Inject 60 mg into the skin every 6 (six) months. 04/16/19  Yes [provider]  hydrALAZINE (APRESOLINE) 25 MG tablet TAKE 1 TABLET BY MOUTH THREE TIMES DAILY; IF BLOOD PRESSURE ABOVE 140/90 Patient taking differently: Take 25 mg by mouth daily. 11/07/20  Yes Sowles, Drue Stager, MD  levocetirizine (XYZAL) 5 MG tablet Take 1 tablet (5 mg total) by mouth every evening. 09/16/21  Yes Sowles, Drue Stager, MD  levothyroxine (SYNTHROID) 50 MCG tablet TAKE 2 TABLETS BY MOUTH DAILY BEFORE BREAKFAST ON MONDAY, WEDNESDAY, FRIDAY, AND 1 TABLET ON ALL OTHER DAYS OF THE WEEK 06/10/21  Yes Sowles, Drue Stager, MD  losartan-hydrochlorothiazide (HYZAAR) 100-12.5 MG tablet TAKE 1 TABLET BY MOUTH DAILY 09/11/21  Yes Sowles, Drue Stager, MD  methocarbamol (ROBAXIN) 500 MG tablet Take 1 tablet  (500 mg total) by mouth every 8 (eight) hours as needed for muscle spasms. 07/06/21  Yes Gillis Santa, MD  Omega-3 Fatty Acids (FISH OIL) 1000 MG CAPS Take 1,000 mg by mouth daily.   Yes [provider]  pantoprazole (PROTONIX) 40 MG tablet TAKE 1 TABLET(40 MG) BY MOUTH DAILY Patient taking differently: Take 40 mg by mouth daily. 02/28/21  Yes Sowles, Drue Stager, MD  simvastatin (ZOCOR) 40 MG tablet Take 1 tablet (40 mg total) by mouth every evening. 09/10/21  Yes Sowles, Drue Stager, MD  timolol (TIMOPTIC) 0.5 % ophthalmic solution Place 1 drop into both eyes 2 (two) times daily. 04/03/17  Yes [provider]  Turmeric 500 MG TABS Take 500 mg by mouth in the morning and at bedtime.   Yes [provider]  VYZULTA 0.024 % SOLN Place 1  drop into both eyes at bedtime. 08/24/18  Yes [provider]  zolpidem (AMBIEN) 5 MG tablet Take 1 tablet (5 mg total) by mouth at bedtime. 08/26/21  Yes Sowles, Drue Stager, MD  clobetasol ointment (TEMOVATE) 0.05 % Apply to affected area every night for 4 weeks, then every other day for 4 weeks and then twice a week for 4 weeks or until resolution. Patient taking differently: Apply 1 application topically daily as needed (Vaginal). Apply to affected area every night for 4 weeks, then every other day for 4 weeks and then twice a week for 4 weeks or until resolution. 09/11/19   Schuman, Stefanie Libel, MD  nitrofurantoin, macrocrystal-monohydrate, (MACROBID) 100 MG capsule Take 1 capsule (100 mg total) by mouth 2 (two) times daily. 09/10/21   Steele Sizer, MD    Family History Family History  Problem Relation Age of Onset   Hypertension Mother    Osteoporosis Mother    COPD Mother    Dementia Mother    Cancer Father        Colon   Hypothyroidism Daughter    Aneurysm Brother    COPD Sister     Social History Social History   Tobacco Use   Smoking status: Never   Smokeless tobacco: Never   Tobacco comments:    smoking cessation  materials not required  Vaping Use   Vaping Use: Never used  Substance Use Topics   Alcohol use: No    Alcohol/week: 0.0 standard drinks   Drug use: No     Allergies   Brimonidine tartrate, Latanoprost, Meloxicam, and Raloxifene   Review of Systems Review of Systems  Constitutional:  Positive for fatigue. Negative for chills, diaphoresis and fever.  HENT:  Positive for congestion, postnasal drip and rhinorrhea. Negative for ear pain, sinus pressure, sinus pain and sore throat.   Respiratory:  Positive for cough. Negative for shortness of breath and wheezing.   Cardiovascular:  Negative for chest pain.  Gastrointestinal:  Negative for abdominal pain, nausea and vomiting.  Musculoskeletal:  Negative for arthralgias and myalgias.  Skin:  Negative for rash.  Neurological:  Negative for weakness and headaches.  Hematological:  Negative for adenopathy.    Physical Exam Triage Vital Signs ED Triage Vitals  Enc Vitals Group     BP 10/16/21 1600 (!) 157/80     Pulse Rate 10/16/21 1600 80     Resp 10/16/21 1600 18     Temp 10/16/21 1600 98.3 F (36.8 C)     Temp Source 10/16/21 1600 Oral     SpO2 10/16/21 1600 97 %     Weight 10/16/21 1556 173 lb 11.6 oz (78.8 kg)     Height 10/16/21 1556 5\' 5"  (1.651 m)     Head Circumference --      Peak Flow --      Pain Score 10/16/21 1555 0     Pain Loc --      Pain Edu? --      Excl. in [place]? --    No data found.  Updated Vital Signs BP (!) 157/80 (BP Location: Left Arm)   Pulse 80   Temp 98.3 F (36.8 C) (Oral)   Resp 18   Ht 5\' 5"  (1.651 m)   Wt 173 lb 11.6 oz (78.8 kg)   LMP  (LMP Unknown)   SpO2 97%   BMI 28.91 kg/m     Physical Exam Vitals and nursing note reviewed.  Constitutional:  General: She is not in acute distress.    Appearance: Normal appearance. She is not ill-appearing or toxic-appearing.  HENT:     Head: Normocephalic and atraumatic.     Nose: Congestion and rhinorrhea present.     Mouth/Throat:      Mouth: Mucous membranes are moist.     Pharynx: Oropharynx is clear.  Eyes:     General: No scleral icterus.       Right eye: No discharge.        Left eye: No discharge.     Conjunctiva/sclera: Conjunctivae normal.  Cardiovascular:     Rate and Rhythm: Normal rate and regular rhythm.     Heart sounds: Normal heart sounds.  Pulmonary:     Effort: Pulmonary effort is normal. No respiratory distress.     Breath sounds: Normal breath sounds. No wheezing, rhonchi or rales.  Musculoskeletal:     Cervical back: Neck supple.  Skin:    General: Skin is dry.  Neurological:     General: No focal deficit present.     Mental Status: She is alert. Mental status is at baseline.     Motor: No weakness.     Gait: Gait normal.  Psychiatric:        Mood and Affect: Mood normal.        Behavior: Behavior normal.        Thought Content: Thought content normal.     UC Treatments / Results  Labs (all labs ordered are listed, but only abnormal results are displayed) Labs Reviewed  SARS CORONAVIRUS 2 (TAT 6-24 HRS)    EKG   Radiology No results found.  Procedures Procedures (including critical care time)  Medications Ordered in UC Medications - No data to display  Initial Impression / Assessment and Plan / UC Course  I have reviewed the triage vital signs and the nursing notes.  Pertinent labs & imaging results that were available during my care of the patient were reviewed by me and considered in my medical decision making (see chart for details).  75 year old female presenting for 3-day history of cough and congestion as well as postnasal drainage and fatigue.  She is afebrile and overall well-appearing.  Exam significant for nasal congestion and light illness reviewed.  No sinus pain.  Chest clear to auscultation heart regular rate and rhythm.  PCR COVID test obtained.  Current CDC guidelines, isolation protocol and ED precautions reviewed with patient.  Advised her that her  symptoms are most consistent with viral URI.  Supportive care encouraged with increasing rest and fluids and continue Mucinex DM.  Advised following up for any worsening symptoms or if she is not better after 10 days.   Final Clinical Impressions(s) / UC Diagnoses   Final diagnoses:  Acute upper respiratory infection  Acute cough  Nasal congestion     Discharge Instructions      URI/COLD SYMPTOMS: Your exam today is consistent with a viral illness. Antibiotics are not indicated at this time. Use medications as directed, including cough syrup, nasal saline, and decongestants. Your symptoms should improve over the next few days and resolve within 7-10 days. Increase rest and fluids. F/u if symptoms worsen or predominate such as sore throat, ear pain, productive cough, shortness of breath, or if you develop high fevers or worsening fatigue over the next several days.    You have received COVID testing today either for positive exposure, concerning symptoms that could be related to COVID infection, screening purposes, or  re-testing after confirmed positive.  Your test obtained today checks for active viral infection in the last 1-2 weeks. If your test is negative now, you can still test positive later. So, if you do develop symptoms you should either get re-tested and/or isolate x 5 days and then strict mask use x 5 days (unvaccinated) or mask use x 10 days (vaccinated). Please follow CDC guidelines.  While Rapid antigen tests come back in 15-20 minutes, send out PCR/molecular test results typically come back within 1-3 days. In the mean time, if you are symptomatic, assume this could be a positive test and treat/monitor yourself as if you do have COVID.   We will call with test results if positive. Please download the MyChart app and set up a profile to access test results.   If symptomatic, go home and rest. Push fluids. Take Tylenol as needed for discomfort. Gargle warm salt water. Throat  lozenges. Take Mucinex DM or Robitussin for cough. Humidifier in bedroom to ease coughing. Warm showers. Also review the COVID handout for more information.  COVID-19 INFECTION: The incubation period of COVID-19 is approximately 14 days after exposure, with most symptoms developing in roughly 4-5 days. Symptoms may range in severity from mild to critically severe. Roughly 80% of those infected will have mild symptoms. People of any age may become infected with COVID-19 and have the ability to transmit the virus. The most common symptoms include: fever, fatigue, cough, body aches, headaches, sore throat, nasal congestion, shortness of breath, nausea, vomiting, diarrhea, changes in smell and/or taste.    COURSE OF ILLNESS Some patients may begin with mild disease which can progress quickly into critical symptoms. If your symptoms are worsening please call ahead to the Emergency Department and proceed there for further treatment. Recovery time appears to be roughly 1-2 weeks for mild symptoms and 3-6 weeks for severe disease.   GO IMMEDIATELY TO ER FOR FEVER YOU ARE UNABLE TO GET DOWN WITH TYLENOL, BREATHING PROBLEMS, CHEST PAIN, FATIGUE, LETHARGY, INABILITY TO EAT OR DRINK, ETC  QUARANTINE AND ISOLATION: To help decrease the spread of COVID-19 please remain isolated if you have COVID infection or are highly suspected to have COVID infection. This means -stay home and isolate to one room in the home if you live with others. Do not share a bed or bathroom with others while ill, sanitize and wipe down all countertops and keep common areas clean and disinfected. Stay home for 5 days. If you have no symptoms or your symptoms are resolving after 5 days, you can leave your house. Continue to wear a mask around others for 5 additional days. If you have been in close contact (within 6 feet) of someone diagnosed with COVID 19, you are advised to quarantine in your home for 14 days as symptoms can develop anywhere from  2-14 days after exposure to the virus. If you develop symptoms, you  must isolate.  Most current guidelines for COVID after exposure -unvaccinated: isolate 5 days and strict mask use x 5 days. Test on day 5 is possible -vaccinated: wear mask x 10 days if symptoms do not develop -You do not necessarily need to be tested for COVID if you have + exposure and  develop symptoms. Just isolate at home x10 days from symptom onset During this global pandemic, CDC advises to practice social distancing, try to stay at least 67ft away from others at all times. Wear a face covering. Wash and sanitize your hands regularly and avoid going anywhere  that is not necessary.  KEEP IN MIND THAT THE COVID TEST IS NOT 100% ACCURATE AND YOU SHOULD STILL DO EVERYTHING TO PREVENT POTENTIAL SPREAD OF VIRUS TO OTHERS (WEAR MASK, WEAR GLOVES, [place] HANDS AND SANITIZE REGULARLY). IF INITIAL TEST IS NEGATIVE, THIS MAY NOT MEAN YOU ARE DEFINITELY NEGATIVE. MOST ACCURATE TESTING IS DONE 5-7 DAYS AFTER EXPOSURE.   It is not advised by CDC to get re-tested after receiving a positive COVID test since you can still test positive for weeks to months after you have already cleared the virus.   *If you have not been vaccinated for COVID, I strongly suggest you consider getting vaccinated as long as there are no contraindications.       ED Prescriptions   None    PDMP not reviewed this encounter.   Danton Clap, PA-C 10/16/21 [phone]

## 2021-10-18 LAB — SARS CORONAVIRUS 2 (TAT 6-24 HRS): SARS Coronavirus 2: NEGATIVE

## 2021-10-19 ENCOUNTER — Ambulatory Visit: Payer: PPO | Admitting: Neurology

## 2021-10-19 ENCOUNTER — Other Ambulatory Visit: Payer: Self-pay

## 2021-10-19 DIAGNOSIS — R202 Paresthesia of skin: Secondary | ICD-10-CM | POA: Diagnosis not present

## 2021-10-19 NOTE — Procedures (Signed)
[facility]  [address]  [place], [place] [address] Tel: [phone] Fax:  [phone] Test Date:  10/19/2021  Patient: Dawn Fuentes DOB: 1946/08/26 Physician: Narda Amber, DO  Sex: Female Height: 5\' 5"  Ref Phys: Steele Sizer, MD  ID#: [id number]   Technician:    Patient Complaints: This is a 75 year old female referred for evaluation of the left upper extremity paresthesias  NCV & EMG Findings: Extensive electrodiagnostic testing of the left upper extremity shows:  Left median, ulnar, radial, and mixed palmar sensory responses are within normal limits. Left median and ulnar motor responses are within normal limits. There is no evidence of active or chronic motor axonal loss changes affecting any of the tested muscles.  Motor unit configuration and recruitment pattern is within normal limits.  Impression: This is a normal study of the left upper extremity.  In particular, there is no evidence of a sensorimotor polyneuropathy, carpal tunnel syndrome, or cervical radiculopathy.   ___________________________ Narda Amber, DO    Nerve Conduction Studies Anti Sensory Summary Table   Stim Site NR Peak (ms) Norm Peak (ms) P-T Amp (V) Norm P-T Amp  Left Median Anti Sensory (2nd Digit)  Wrist    2.9 <3.8 39.3 >10  Left Radial Anti Sensory (Base 1st Digit)  34C  Wrist    2.0 <2.8 28.2 >10  Left Ulnar Anti Sensory (5th Digit)  34C  Wrist    2.5 <3.2 32.7 >5   Motor Summary Table   Stim Site NR Onset (ms) Norm Onset (ms) O-P Amp (mV) Norm O-P Amp Site1 Site2 Delta-0 (ms) Dist (cm) Vel (m/s) Norm Vel (m/s)  Left Median Motor (Abd Poll Brev)  34C  Wrist    3.1 <4.0 7.0 >5 Elbow Wrist 4.9 27.0 55 >50  Elbow    8.0  6.4         Left Ulnar Motor (Abd Dig Minimi)  34C  Wrist    2.3 <3.1 8.1 >7 B Elbow Wrist 4.0 21.0 53 >50  B Elbow    6.3  8.0  A Elbow B Elbow 1.8 10.0 56 >50  A Elbow    8.1  7.9          Comparison Summary Table   Stim Site NR  Peak (ms) Norm Peak (ms) P-T Amp (V) Site1 Site2 Delta-P (ms) Norm Delta (ms)  Left Median/Ulnar Palm Comparison (Wrist - 8cm)  34C  Median Palm    1.5 <2.2 95.7 Median Palm Ulnar Palm 0.0   Ulnar Palm    1.5 <2.2 19.0       EMG   Side Muscle Ins Act Fibs Psw Fasc Number Recrt Dur Dur. Amp Amp. Poly Poly. Comment  Left 1stDorInt Nml Nml Nml Nml Nml Nml Nml Nml Nml Nml Nml Nml N/A  Left PronatorTeres Nml Nml Nml Nml Nml Nml Nml Nml Nml Nml Nml Nml N/A  Left Biceps Nml Nml Nml Nml Nml Nml Nml Nml Nml Nml Nml Nml N/A  Left Triceps Nml Nml Nml Nml Nml Nml Nml Nml Nml Nml Nml Nml N/A  Left Deltoid Nml Nml Nml Nml Nml Nml Nml Nml Nml Nml Nml Nml N/A      Waveforms:

## 2021-10-25 ENCOUNTER — Other Ambulatory Visit: Payer: Self-pay | Admitting: Family Medicine

## 2021-10-25 DIAGNOSIS — E039 Hypothyroidism, unspecified: Secondary | ICD-10-CM

## 2021-10-25 NOTE — Telephone Encounter (Signed)
Requested Prescriptions  Pending Prescriptions Disp Refills  . levothyroxine (SYNTHROID) 50 MCG tablet [Pharmacy Med Name: LEVOTHYROXINE 0.05MG  (50MCG) TAB] 128 tablet 0    Sig: TAKE 2 TABLETS BY MOUTH DAILY BEFORE BREAKFAST ON MONDAY, WEDNESDAY, FRIDAY, AND 1 TABLET ON ALL OTHER DAYS OF THE WEEK     Endocrinology:  Hypothyroid Agents Failed - 10/25/2021  7:10 PM      Failed - TSH needs to be rechecked within 3 months after an abnormal result. Refill until TSH is due.      Passed - TSH in normal range and within 360 days    TSH  Date Value Ref Range Status  04/15/2021 0.80 0.40 - 4.50 mIU/L Final         Passed - Valid encounter within last 12 months    Recent Outpatient Visits          1 month ago Stage 3a chronic kidney disease [facility])   [facility] Steele Sizer, MD   7 months ago Vitamin D deficiency   [facility] Steele Sizer, MD   10 months ago Well adult exam   [facility] Steele Sizer, MD   11 months ago Cervical radiculitis   [facility] Steele Sizer, MD   1 year ago Benign hypertension   [facility] Steele Sizer, MD      Future Appointments            In 4 months Ancil Boozer, Drue Stager, MD [facility], [facility]

## 2021-10-26 ENCOUNTER — Other Ambulatory Visit: Payer: Self-pay

## 2021-10-26 ENCOUNTER — Ambulatory Visit (INDEPENDENT_AMBULATORY_CARE_PROVIDER_SITE_OTHER): Payer: PPO

## 2021-10-26 DIAGNOSIS — E538 Deficiency of other specified B group vitamins: Secondary | ICD-10-CM | POA: Diagnosis not present

## 2021-10-26 MED ORDER — CYANOCOBALAMIN 1000 MCG/ML IJ SOLN
1000.0000 ug | Freq: Once | INTRAMUSCULAR | Status: AC
  Administered 2021-10-26: 1000 ug via INTRAMUSCULAR

## 2021-10-28 ENCOUNTER — Other Ambulatory Visit: Payer: Self-pay

## 2021-10-28 ENCOUNTER — Ambulatory Visit: Payer: PPO | Admitting: Podiatry

## 2021-10-28 ENCOUNTER — Encounter: Payer: Self-pay | Admitting: Podiatry

## 2021-10-28 DIAGNOSIS — Q667 Congenital pes cavus, unspecified foot: Secondary | ICD-10-CM | POA: Diagnosis not present

## 2021-10-28 DIAGNOSIS — M722 Plantar fascial fibromatosis: Secondary | ICD-10-CM | POA: Diagnosis not present

## 2021-10-28 NOTE — Progress Notes (Signed)
Subjective:  Patient ID: Dawn Fuentes, female    DOB: [birth year],  MRN: [id number]  Chief Complaint  Patient presents with   Foot Pain    Left foot pain  PT stated that she feels like a pinched nerve in the top of her foot     75 y.o. female presents with the above complaint.  Patient presents with left heel pain that has been going on for quite some time.  Patient states painful to walk on.  She was treated in the past by Dr. Milinda Pointer.  She wanted to get this evaluated.  She states it hurts with ambulation hurts when taking the first up in the morning.  She does not wear any orthotics.  She wears nonsupportive shoes.  She denies any other acute complaints pain scale is 6 out of 10 sharp shooting in nature.   Review of Systems: Negative except as noted in the HPI. Denies N/V/F/Ch.  Past Medical History:  Diagnosis Date   Allergy    Anxiety    ER visit in Sept 2017 for anxiety   Arthritis    Benign parotid tumor    Chronic insomnia    Colon adenoma    Dyslipidemia    Dysphagia    Female stress incontinence    GERD (gastroesophageal reflux disease)    Glaucoma, both eyes    Dr. Wallace Going- [facility]   Headache    Hyperlipidemia    Hypertension    controlled on meds   Hypothyroidism    Menopause syndrome    Osteoporosis    Polyp of colon    Renal insufficiency    mild- keeping an eye on   Vitamin D deficiency     Current Outpatient Medications:    ALPRAZolam (XANAX) 0.25 MG tablet, Take 1 tablet (0.25 mg total) by mouth daily as needed for anxiety. Rarely, Disp: 30 tablet, Rfl: 0   Calcium Carb-Cholecalciferol 600-800 MG-UNIT TABS, Take 1 tablet by mouth daily., Disp: , Rfl:    citalopram (CELEXA) 20 MG tablet, Take 1 tablet (20 mg total) by mouth daily., Disp: 90 tablet, Rfl: 1   clobetasol ointment (TEMOVATE) 0.05 %, Apply to affected area every night for 4 weeks, then every other day for 4 weeks and then twice a week for 4 weeks or until resolution. (Patient  taking differently: Apply 1 application topically daily as needed (Vaginal). Apply to affected area every night for 4 weeks, then every other day for 4 weeks and then twice a week for 4 weeks or until resolution.), Disp: 30 g, Rfl: 5   Cyanocobalamin (VITAMIN B-12 IJ), Inject as directed every 30 (thirty) days., Disp: , Rfl:    denosumab (PROLIA) 60 MG/ML SOSY injection, Inject 60 mg into the skin every 6 (six) months., Disp: , Rfl:    hydrALAZINE (APRESOLINE) 25 MG tablet, TAKE 1 TABLET BY MOUTH THREE TIMES DAILY; IF BLOOD PRESSURE ABOVE 140/90 (Patient taking differently: Take 25 mg by mouth daily.), Disp: 270 tablet, Rfl: 0   levocetirizine (XYZAL) 5 MG tablet, Take 1 tablet (5 mg total) by mouth every evening., Disp: 90 tablet, Rfl: 1   levothyroxine (SYNTHROID) 50 MCG tablet, TAKE 2 TABLETS BY MOUTH DAILY BEFORE BREAKFAST ON MONDAY, WEDNESDAY, FRIDAY, AND 1 TABLET ON ALL OTHER DAYS OF THE WEEK, Disp: 128 tablet, Rfl: 0   losartan-hydrochlorothiazide (HYZAAR) 100-12.5 MG tablet, TAKE 1 TABLET BY MOUTH DAILY, Disp: 90 tablet, Rfl: 0   methocarbamol (ROBAXIN) 500 MG tablet, Take 1 tablet (500  mg total) by mouth every 8 (eight) hours as needed for muscle spasms., Disp: 60 tablet, Rfl: 2   nitrofurantoin, macrocrystal-monohydrate, (MACROBID) 100 MG capsule, Take 1 capsule (100 mg total) by mouth 2 (two) times daily., Disp: 10 capsule, Rfl: 1   Omega-3 Fatty Acids (FISH OIL) 1000 MG CAPS, Take 1,000 mg by mouth daily., Disp: , Rfl:    pantoprazole (PROTONIX) 40 MG tablet, TAKE 1 TABLET(40 MG) BY MOUTH DAILY (Patient taking differently: Take 40 mg by mouth daily.), Disp: 90 tablet, Rfl: 2   simvastatin (ZOCOR) 40 MG tablet, Take 1 tablet (40 mg total) by mouth every evening., Disp: 90 tablet, Rfl: 1   timolol (TIMOPTIC) 0.5 % ophthalmic solution, Place 1 drop into both eyes 2 (two) times daily., Disp: , Rfl: 5   Turmeric 500 MG TABS, Take 500 mg by mouth in the morning and at bedtime., Disp: , Rfl:     VYZULTA 0.024 % SOLN, Place 1 drop into both eyes at bedtime., Disp: , Rfl: 2   zolpidem (AMBIEN) 5 MG tablet, Take 1 tablet (5 mg total) by mouth at bedtime., Disp: 90 tablet, Rfl: 0  Current Facility-Administered Medications:    cyanocobalamin ((VITAMIN B-12)) injection 1,000 mcg, 1,000 mcg, Intramuscular, Q30 days, Steele Sizer, MD, 1,000 mcg at 09/10/21 1357  Social History   Tobacco Use  Smoking Status Never  Smokeless Tobacco Never  Tobacco Comments   smoking cessation materials not required    Allergies  Allergen Reactions   Brimonidine Tartrate Other (See Comments)    Burning in eyes   Latanoprost Other (See Comments)    burning   Meloxicam     Increased blood pressure    Raloxifene     bone pain   Objective:  There were no vitals filed for this visit. There is no height or weight on file to calculate BMI. Constitutional Well developed. Well nourished.  Vascular Dorsalis pedis pulses palpable bilaterally. Posterior tibial pulses palpable bilaterally. Capillary refill normal to all digits.  No cyanosis or clubbing noted. Pedal hair growth normal.  Neurologic Normal speech. Oriented to person, place, and time. Epicritic sensation to light touch grossly present bilaterally.  Dermatologic Nails well groomed and normal in appearance. No open wounds. No skin lesions.  Orthopedic: Normal joint ROM without pain or crepitus bilaterally. No visible deformities. Tender to palpation at the calcaneal tuber left. No pain with calcaneal squeeze left. Ankle ROM diminished range of motion left. Silfverskiold Test: positive left.   Radiographs: None  Assessment:   1. Pes cavus   2. Plantar fasciitis, left    Plan:  Patient was evaluated and treated and all questions answered.  Plantar Fasciitis, left - XR reviewed as above.  - Educated on icing and stretching. Instructions given.  - Injection delivered to the plantar fascia as below. - DME: Plantar Fascial  Brace - Pharmacologic management: None  Pes cavus -I explained to patient the etiology of pes planovalgus and relationship with pes cavus and various treatment options were discussed.  Given patient foot structure in the setting of Planter fasciitis I believe patient will benefit from custom-made orthotics to help control the hindfoot motion support the arch of the foot and take the stress away from plantar fascial.  Patient agrees with the plan like to proceed with orthotics -Patient was casted for orthotics   Procedure: Injection Tendon/Ligament Location: Left plantar fascia at the glabrous junction; medial approach. Skin Prep: alcohol Injectate: 0.5 cc 0.5% marcaine plain, 0.5 cc of 1%  Lidocaine, 0.5 cc kenalog 10. Disposition: Patient tolerated procedure well. Injection site dressed with a band-aid.  No follow-ups on file.

## 2021-11-16 ENCOUNTER — Encounter: Payer: Self-pay | Admitting: Student in an Organized Health Care Education/Training Program

## 2021-11-16 ENCOUNTER — Ambulatory Visit
Payer: PPO | Attending: Student in an Organized Health Care Education/Training Program | Admitting: Student in an Organized Health Care Education/Training Program

## 2021-11-16 ENCOUNTER — Other Ambulatory Visit: Payer: Self-pay

## 2021-11-16 VITALS — BP 172/97 | HR 94 | Temp 96.9°F | Resp 15 | Ht 65.0 in | Wt 170.0 lb

## 2021-11-16 DIAGNOSIS — R519 Headache, unspecified: Secondary | ICD-10-CM | POA: Insufficient documentation

## 2021-11-16 DIAGNOSIS — G8929 Other chronic pain: Secondary | ICD-10-CM | POA: Diagnosis present

## 2021-11-16 DIAGNOSIS — M5412 Radiculopathy, cervical region: Secondary | ICD-10-CM | POA: Insufficient documentation

## 2021-11-16 DIAGNOSIS — G43809 Other migraine, not intractable, without status migrainosus: Secondary | ICD-10-CM | POA: Diagnosis present

## 2021-11-16 DIAGNOSIS — M5030 Other cervical disc degeneration, unspecified cervical region: Secondary | ICD-10-CM | POA: Insufficient documentation

## 2021-11-16 DIAGNOSIS — G894 Chronic pain syndrome: Secondary | ICD-10-CM | POA: Insufficient documentation

## 2021-11-16 NOTE — Progress Notes (Signed)
PROVIDER NOTE: Information contained herein reflects review and annotations entered in association with encounter. Interpretation of such information and data should be left to medically-trained personnel. Information provided to patient can be located elsewhere in the medical record under "Patient Instructions". Document created using STT-dictation technology, any transcriptional errors that may result from process are unintentional.    Patient: Dawn Fuentes  Service Category: E/M  Provider: Gillis Santa, MD  DOB: 1946-11-11  DOS: 11/16/2021  Specialty: Interventional Pain Management  MRN: [id number]  Setting: Ambulatory outpatient  PCP: Dawn Sizer, MD  Type: Established Patient    Referring Provider: Steele Sizer, MD  Location: Office  Delivery: Face-to-face     HPI  Dawn Fuentes, a 75 y.o. year old female, is here today because of her Cervicogenic migraine [G43.809]. [name] primary complain today is Headache (Right side) Last encounter: My last encounter with her was on 08/17/21  Pain Assessment: Severity of Chronic pain is reported as a 5 /10. Location: Head Right/from right temple to right ear. Onset: More than a month ago. Quality: Aching. Timing: Intermittent. Modifying factor(s): meds. Vitals:  height is '5\' 5"'  (1.651 m) and weight is 170 lb (77.1 kg). Her temporal temperature is 96.9 F (36.1 C) (abnormal). Her blood pressure is 172/97 (abnormal) and her pulse is 94. Her respiration is 15 and oxygen saturation is 96%.   Reason for encounter: medication management.    Patient follows up today for right temporal headache that has been chronic in nature.  At her last clinic visit, she was experiencing side effects with Topamax so we decreased her dose from 50 to 25 mg.  She continues to have side effects including left arm numbness and not feeling like herself.  She is unable to tolerate NSAIDs given history of chronic kidney disease.  She utilizes acetaminophen for  headaches.  At this point, I will have the patient see Dr. Manuella Fuentes with neurology for headache management and work-up.  Patient in agreement with plan.   ROS  Constitutional: Denies any fever or chills Gastrointestinal: No reported hemesis, hematochezia, vomiting, or acute GI distress Musculoskeletal: Right facial headache most pronounced at temple, negative for phonophobia, photophobia Neurological: No reported episodes of acute onset apraxia, aphasia, dysarthria, agnosia, amnesia, paralysis, loss of coordination, or loss of consciousness  Medication Review  ALPRAZolam, Calcium Carb-Cholecalciferol, Cyanocobalamin, Fish Oil, Latanoprostene Bunod, Turmeric, citalopram, clobetasol ointment, denosumab, hydrALAZINE, levocetirizine, levothyroxine, losartan-hydrochlorothiazide, methocarbamol, nitrofurantoin (macrocrystal-monohydrate), pantoprazole, simvastatin, timolol, and zolpidem  History Review  Allergy: [name] is allergic to brimonidine tartrate, latanoprost, meloxicam, and raloxifene. Drug: [name]  reports no history of drug use. Alcohol:  reports no history of alcohol use. Tobacco:  reports that she has never smoked. She has never used smokeless tobacco. Social: [name]  reports that she has never smoked. She has never used smokeless tobacco. She reports that she does not drink alcohol and does not use drugs. Medical:  has a past medical history of Allergy, Anxiety, Arthritis, Benign parotid tumor, Chronic insomnia, Colon adenoma, Dyslipidemia, Dysphagia, Female stress incontinence, GERD (gastroesophageal reflux disease), Glaucoma, both eyes, Headache, Hyperlipidemia, Hypertension, Hypothyroidism, Menopause syndrome, Osteoporosis, Polyp of colon, Renal insufficiency, and Vitamin D deficiency. Surgical: [name]  has a past surgical history that includes Ovarian cyst removal; Colonoscopy with propofol (N/A, 05/29/2015); Esophagogastroduodenoscopy (N/A, 05/29/2015); Appendectomy;  Abdominal hysterectomy; Eye surgery; Photocoagulation with laser (Right, 11/21/2016); Esophagogastroduodenoscopy (N/A, 09/14/2020); Colonoscopy (N/A, 09/14/2020); Tonsillectomy; and Parotidectomy (Right, 05/05/2021). Family: family history includes Aneurysm in her brother; COPD  in her mother and sister; Cancer in her father; Dementia in her mother; Hypertension in her mother; Hypothyroidism in her daughter; Osteoporosis in her mother.  Laboratory Chemistry Profile   Renal Lab Results  Component Value Date   BUN 17 09/10/2021   CREATININE 1.23 (H) 09/10/2021   LABCREA 15 (L) 10/19/2020   BCR 14 09/10/2021   GFRAA 44 (L) 04/15/2021   GFRNONAA 38 (L) 04/15/2021     Hepatic Lab Results  Component Value Date   AST 36 (H) 09/10/2021   ALT 35 (H) 09/10/2021   ALBUMIN 4.2 03/08/2017   ALKPHOS 50 03/08/2017     Electrolytes Lab Results  Component Value Date   NA 139 09/10/2021   K 4.3 09/10/2021   CL 105 09/10/2021   CALCIUM 9.7 09/10/2021     Bone Lab Results  Component Value Date   VD25OH 35 03/18/2020     Inflammation (CRP: Acute Phase) (ESR: Chronic Phase) No results found for: CRP, ESRSEDRATE, LATICACIDVEN     Note: Above Lab results reviewed.  Recent Imaging Review  NCV with EMG(electromyography) Dawn Berthold, DO     10/19/2021  4:11 PM [facility]  [address], [address]  [place], [place] [address] Tel: [phone] Fax:  [phone] Test Date:  10/19/2021  Patient: Dawn Fuentes DOB: 1946/12/02 Physician: Dawn Amber, DO   Sex: Female Height: '5\' 5"'  Ref Phys: Dawn Sizer, MD  ID#: [id number]   Technician:    Patient Complaints: This is a 75 year old female referred for evaluation of the left  upper extremity paresthesias  NCV & EMG Findings: Extensive electrodiagnostic testing of the left upper extremity  shows:  Left median, ulnar, radial, and mixed palmar sensory responses  are within normal limits. Left median and ulnar motor  responses are within normal limits. There is no evidence of active or chronic motor axonal loss  changes affecting any of the tested muscles.  Motor unit  configuration and recruitment pattern is within normal limits.  Impression: This is a normal study of the left upper extremity.  In  particular, there is no evidence of a sensorimotor  polyneuropathy, carpal tunnel syndrome, or cervical  radiculopathy.  ___________________________ Dawn Amber, DO  Nerve Conduction Studies Anti Sensory Summary Table   Stim Site NR Peak (ms) Norm Peak (ms) P-T Amp (V) Norm P-T Amp  Left Median Anti Sensory (2nd Digit)  Wrist    2.9 <3.8 39.3 >10  Left Radial Anti Sensory (Base 1st Digit)  34C  Wrist    2.0 <2.8 28.2 >10  Left Ulnar Anti Sensory (5th Digit)  34C  Wrist    2.5 <3.2 32.7 >5   Motor Summary Table   Stim Site NR Onset (ms) Norm Onset (ms) O-P Amp (mV) Norm O-P  Amp Site1 Site2 Delta-0 (ms) Dist (cm) Vel (m/s) Norm Vel (m/s)  Left Median Motor (Abd Poll Brev)  34C  Wrist    3.1 <4.0 7.0 >5 Elbow Wrist 4.9 27.0 55 >50  Elbow    8.0  6.4         Left Ulnar Motor (Abd Dig Minimi)  34C  Wrist    2.3 <3.1 8.1 >7 B Elbow Wrist 4.0 21.0 53 >50  B Elbow    6.3  8.0  A Elbow B Elbow 1.8 10.0 56 >50  A Elbow    8.1  7.9          Comparison Summary Table   Stim Site NR Peak (ms)  Norm Peak (ms) P-T Amp (V) Site1 Site2  Delta-P (ms) Norm Delta (ms)  Left Median/Ulnar Palm Comparison (Wrist - 8cm)  34C  Median Palm    1.5 <2.2 95.7 Median Palm Ulnar Palm 0.0   Ulnar Palm    1.5 <2.2 19.0       EMG   Side Muscle Ins Act Fibs Psw Fasc Number Recrt Dur Dur. Amp Amp.  Poly Poly. Comment  Left 1stDorInt Nml Nml Nml Nml Nml Nml Nml Nml Nml Nml Nml Nml  N/A  Left PronatorTeres Nml Nml Nml Nml Nml Nml Nml Nml Nml Nml Nml  Nml N/A  Left Biceps Nml Nml Nml Nml Nml Nml Nml Nml Nml Nml Nml Nml N/A  Left Triceps Nml Nml Nml Nml Nml Nml Nml Nml Nml Nml Nml Nml N/A  Left Deltoid Nml Nml  Nml Nml Nml Nml Nml Nml Nml Nml Nml Nml N/A   Waveforms:          Note: Reviewed        Physical Exam  General appearance: Well nourished, well developed, and well hydrated. In no apparent acute distress Mental status: Alert, oriented x 3 (person, place, & time)       Respiratory: No evidence of acute respiratory distress Eyes: PERLA Vitals: BP (!) 172/97   Pulse 94   Temp (!) 96.9 F (36.1 C) (Temporal)   Resp 15   Ht '5\' 5"'  (1.651 m)   Wt 170 lb (77.1 kg)   LMP  (LMP Unknown)   SpO2 96%   BMI 28.29 kg/m  BMI: Estimated body mass index is 28.29 kg/m as calculated from the following:   Height as of this encounter: '5\' 5"'  (1.651 m).   Weight as of this encounter: 170 lb (77.1 kg). Ideal: Ideal body weight: 57 kg (125 lb 10.6 oz) Adjusted ideal body weight: 65 kg (143 lb 6.4 oz)  Assessment   Status Diagnosis  Persistent Persistent Persistent 1. Cervicogenic migraine   2. Chronic nonintractable headache, unspecified headache type   3. Degeneration of cervical intervertebral disc   4. Cervical radiculitis   5. Chronic pain syndrome        Plan of Care   [name] has a current medication list which includes the following long-term medication(s): calcium carb-cholecalciferol, citalopram, hydralazine, levocetirizine, levothyroxine, losartan-hydrochlorothiazide, pantoprazole, simvastatin, and zolpidem.  Orders Placed This Encounter  Procedures   Ambulatory referral to Neurology    Referral Priority:   Routine    Referral Type:   Consultation    Referral Reason:   Specialty Services Required    Referred to Provider:   Vladimir Crofts, MD    Requested Specialty:   Neurology    Number of Visits Requested:   1    Follow-up plan:   Return if symptoms worsen or fail to improve.     C7-T1 ESI, not effective, right C4, C5, C6 cervical facet medial branch nerve block #1 01/25/2021:100% pain relief for 1 day,  consider botox, referral to Neurology for headache work  up      Recent Visits No visits were found meeting these conditions. Showing recent visits within past 90 days and meeting all other requirements Today's Visits Date Type Provider Dept  11/16/21 Office Visit Dawn Santa, MD [facility]  Showing today's visits and meeting all other requirements Future Appointments No visits were found meeting these conditions. Showing future appointments within next 90 days and meeting all other requirements I discussed the  assessment and treatment plan with the patient. The patient was provided an opportunity to ask questions and all were answered. The patient agreed with the plan and demonstrated an understanding of the instructions.  Patient advised to call back or seek an in-person evaluation if the symptoms or condition worsens.  Duration of encounter: 59mnutes.  Note by: BGillis Santa MD Date: 11/16/2021; Time: 11:43 AM

## 2021-11-22 ENCOUNTER — Telehealth: Payer: Self-pay | Admitting: Podiatry

## 2021-11-22 NOTE — Telephone Encounter (Signed)
Received [employer] Auth # [id number] for orthotics(L3020 X2)... valid 12.5.2022 thru 3.5.2023

## 2021-11-28 ENCOUNTER — Other Ambulatory Visit: Payer: Self-pay | Admitting: Family Medicine

## 2021-11-28 DIAGNOSIS — G4700 Insomnia, unspecified: Secondary | ICD-10-CM

## 2021-11-30 ENCOUNTER — Ambulatory Visit (INDEPENDENT_AMBULATORY_CARE_PROVIDER_SITE_OTHER): Payer: PPO | Admitting: Podiatry

## 2021-11-30 ENCOUNTER — Other Ambulatory Visit: Payer: Self-pay

## 2021-11-30 DIAGNOSIS — M62462 Contracture of muscle, left lower leg: Secondary | ICD-10-CM

## 2021-11-30 DIAGNOSIS — M722 Plantar fascial fibromatosis: Secondary | ICD-10-CM | POA: Diagnosis not present

## 2021-11-30 DIAGNOSIS — M21862 Other specified acquired deformities of left lower leg: Secondary | ICD-10-CM

## 2021-12-02 ENCOUNTER — Telehealth: Payer: Self-pay | Admitting: Podiatry

## 2021-12-02 NOTE — Progress Notes (Signed)
Subjective:  Patient ID: Dawn Fuentes, female    DOB: June 13, 1946,  MRN: [id number]  Chief Complaint  Patient presents with   Plantar Fasciitis    Pt stated that the injection did help     75 y.o. female presents with the above complaint.  Patient presents with follow-up of left second and panel fasciitis.  She states that is doing a lot better.  She still has some residual pain.  She is about 70% better.  She would like to discuss next treatment plan.  She wanted know if she can get her orthotics today as well.   Review of Systems: Negative except as noted in the HPI. Denies N/V/F/Ch.  Past Medical History:  Diagnosis Date   Allergy    Anxiety    ER visit in Sept 2017 for anxiety   Arthritis    Benign parotid tumor    Chronic insomnia    Colon adenoma    Dyslipidemia    Dysphagia    Female stress incontinence    GERD (gastroesophageal reflux disease)    Glaucoma, both eyes    Dr. Wallace Going- [facility]   Headache    Hyperlipidemia    Hypertension    controlled on meds   Hypothyroidism    Menopause syndrome    Osteoporosis    Polyp of colon    Renal insufficiency    mild- keeping an eye on   Vitamin D deficiency     Current Outpatient Medications:    ALPRAZolam (XANAX) 0.25 MG tablet, Take 1 tablet (0.25 mg total) by mouth daily as needed for anxiety. Rarely, Disp: 30 tablet, Rfl: 0   Calcium Carb-Cholecalciferol 600-800 MG-UNIT TABS, Take 1 tablet by mouth daily., Disp: , Rfl:    citalopram (CELEXA) 20 MG tablet, Take 1 tablet (20 mg total) by mouth daily., Disp: 90 tablet, Rfl: 1   clobetasol ointment (TEMOVATE) 0.05 %, Apply to affected area every night for 4 weeks, then every other day for 4 weeks and then twice a week for 4 weeks or until resolution. (Patient taking differently: Apply 1 application topically daily as needed (Vaginal). Apply to affected area every night for 4 weeks, then every other day for 4 weeks and then twice a week for 4 weeks or  until resolution.), Disp: 30 g, Rfl: 5   Cyanocobalamin (VITAMIN B-12 IJ), Inject as directed every 30 (thirty) days., Disp: , Rfl:    denosumab (PROLIA) 60 MG/ML SOSY injection, Inject 60 mg into the skin every 6 (six) months., Disp: , Rfl:    hydrALAZINE (APRESOLINE) 25 MG tablet, TAKE 1 TABLET BY MOUTH THREE TIMES DAILY; IF BLOOD PRESSURE ABOVE 140/90 (Patient taking differently: Take 25 mg by mouth daily.), Disp: 270 tablet, Rfl: 0   levocetirizine (XYZAL) 5 MG tablet, Take 1 tablet (5 mg total) by mouth every evening., Disp: 90 tablet, Rfl: 1   levothyroxine (SYNTHROID) 50 MCG tablet, TAKE 2 TABLETS BY MOUTH DAILY BEFORE BREAKFAST ON MONDAY, WEDNESDAY, FRIDAY, AND 1 TABLET ON ALL OTHER DAYS OF THE WEEK, Disp: 128 tablet, Rfl: 0   losartan-hydrochlorothiazide (HYZAAR) 100-12.5 MG tablet, TAKE 1 TABLET BY MOUTH DAILY, Disp: 90 tablet, Rfl: 0   methocarbamol (ROBAXIN) 500 MG tablet, Take 1 tablet (500 mg total) by mouth every 8 (eight) hours as needed for muscle spasms., Disp: 60 tablet, Rfl: 2   nitrofurantoin, macrocrystal-monohydrate, (MACROBID) 100 MG capsule, Take 1 capsule (100 mg total) by mouth 2 (two) times daily., Disp: 10 capsule, Rfl: 1  Omega-3 Fatty Acids (FISH OIL) 1000 MG CAPS, Take 1,000 mg by mouth daily., Disp: , Rfl:    pantoprazole (PROTONIX) 40 MG tablet, TAKE 1 TABLET(40 MG) BY MOUTH DAILY (Patient taking differently: Take 40 mg by mouth daily.), Disp: 90 tablet, Rfl: 2   simvastatin (ZOCOR) 40 MG tablet, Take 1 tablet (40 mg total) by mouth every evening., Disp: 90 tablet, Rfl: 1   timolol (TIMOPTIC) 0.5 % ophthalmic solution, Place 1 drop into both eyes 2 (two) times daily., Disp: , Rfl: 5   Turmeric 500 MG TABS, Take 500 mg by mouth in the morning and at bedtime., Disp: , Rfl:    VYZULTA 0.024 % SOLN, Place 1 drop into both eyes at bedtime., Disp: , Rfl: 2   zolpidem (AMBIEN) 5 MG tablet, TAKE 1 TABLET(5 MG) BY MOUTH AT BEDTIME, Disp: 90 tablet, Rfl: 0  Current  Facility-Administered Medications:    cyanocobalamin ((VITAMIN B-12)) injection 1,000 mcg, 1,000 mcg, Intramuscular, Q30 days, Steele Sizer, MD, 1,000 mcg at 09/10/21 1357  Social History   Tobacco Use  Smoking Status Never  Smokeless Tobacco Never  Tobacco Comments   smoking cessation materials not required    Allergies  Allergen Reactions   Brimonidine Tartrate Other (See Comments)    Burning in eyes   Latanoprost Other (See Comments)    burning   Meloxicam     Increased blood pressure    Raloxifene     bone pain   Objective:  There were no vitals filed for this visit. There is no height or weight on file to calculate BMI. Constitutional Well developed. Well nourished.  Vascular Dorsalis pedis pulses palpable bilaterally. Posterior tibial pulses palpable bilaterally. Capillary refill normal to all digits.  No cyanosis or clubbing noted. Pedal hair growth normal.  Neurologic Normal speech. Oriented to person, place, and time. Epicritic sensation to light touch grossly present bilaterally.  Dermatologic Nails well groomed and normal in appearance. No open wounds. No skin lesions.  Orthopedic: Normal joint ROM without pain or crepitus bilaterally. No visible deformities. Tender to palpation at the calcaneal tuber left. No pain with calcaneal squeeze left. Ankle ROM diminished range of motion left. Silfverskiold Test: positive left.   Radiographs: None  Assessment:   1. Plantar fasciitis, left   2. Gastrocnemius equinus, left     Plan:  Patient was evaluated and treated and all questions answered.  Plantar Fasciitis, left with underlying gastrocnemius equinus - XR reviewed as above.  - Educated on icing and stretching. Instructions given.  -Second injection delivered to the plantar fascia as below. - DME: Plantar Fascial Brace - Pharmacologic management: None  Pes cavus -I explained to patient the etiology of pes planovalgus and relationship with pes  cavus and various treatment options were discussed.  Given patient foot structure in the setting of Planter fasciitis I believe patient will benefit from custom-made orthotics to help control the hindfoot motion support the arch of the foot and take the stress away from plantar fascial.  Patient agrees with the plan like to proceed with orthotics -Awaiting orthotics   Procedure: Injection Tendon/Ligament Location: Left plantar fascia at the glabrous junction; medial approach. Skin Prep: alcohol Injectate: 0.5 cc 0.5% marcaine plain, 0.5 cc of 1% Lidocaine, 0.5 cc kenalog 10. Disposition: Patient tolerated procedure well. Injection site dressed with a band-aid.  No follow-ups on file.

## 2021-12-02 NOTE — Telephone Encounter (Signed)
Orthotics in [place] to be taken to [facility]... lvm for pt to call to schedule an appt to pick them up in [place] office.. next available as of now is 12.16.2022 and 12.19.2022 in the afternoon

## 2021-12-03 ENCOUNTER — Other Ambulatory Visit: Payer: Self-pay

## 2021-12-03 ENCOUNTER — Ambulatory Visit: Payer: PPO

## 2021-12-03 DIAGNOSIS — M722 Plantar fascial fibromatosis: Secondary | ICD-10-CM

## 2021-12-03 NOTE — Progress Notes (Signed)
SITUATION: Reason for Visit: Fitting and Delivery of Custom Fabricated Foot Orthoses Patient Report: Patient reports comfort and is satisfied with device.  OBJECTIVE DATA: Patient History / Diagnosis:  No change in pathology Provided Device:  Functional foot orthotics  GOAL OF ORTHOSIS - Improve gait - Decrease energy expenditure - Improve Balance - Provide Triplanar stability of foot complex - Facilitate motion  ACTIONS PERFORMED Patient was fit with foot orthotics trimmed to shoe last. Patient tolerated fittign procedure. Device was modified as follows to better fit patient: - Toe plate was trimmed to shoe last  Patient was provided with verbal and written instruction and demonstration regarding donning, doffing, wear, care, proper fit, function, purpose, cleaning, and use of the orthosis and in all related precautions and risks and benefits regarding the orthosis.  Patient was also provided with verbal instruction regarding how to report any failures or malfunctions of the orthosis and necessary follow up care. Patient was also instructed to contact our office regarding any change in status that may affect the function of the orthosis.  Patient demonstrated independence with proper donning, doffing, and fit and verbalized understanding of all instructions.  PLAN: Patient is to follow up in one week or as necessary (PRN). All questions were answered and concerns addressed. Plan of care was discussed with and agreed upon by the patient.

## 2021-12-06 ENCOUNTER — Other Ambulatory Visit: Payer: Self-pay | Admitting: Family Medicine

## 2021-12-06 DIAGNOSIS — I10 Essential (primary) hypertension: Secondary | ICD-10-CM

## 2021-12-07 ENCOUNTER — Ambulatory Visit (INDEPENDENT_AMBULATORY_CARE_PROVIDER_SITE_OTHER): Payer: PPO

## 2021-12-07 DIAGNOSIS — E538 Deficiency of other specified B group vitamins: Secondary | ICD-10-CM | POA: Diagnosis not present

## 2021-12-27 DIAGNOSIS — G44221 Chronic tension-type headache, intractable: Secondary | ICD-10-CM | POA: Diagnosis not present

## 2021-12-27 DIAGNOSIS — M25511 Pain in right shoulder: Secondary | ICD-10-CM | POA: Diagnosis not present

## 2021-12-27 DIAGNOSIS — R519 Headache, unspecified: Secondary | ICD-10-CM | POA: Diagnosis not present

## 2021-12-27 DIAGNOSIS — G8929 Other chronic pain: Secondary | ICD-10-CM | POA: Diagnosis not present

## 2021-12-27 DIAGNOSIS — Z87898 Personal history of other specified conditions: Secondary | ICD-10-CM | POA: Diagnosis not present

## 2021-12-28 ENCOUNTER — Other Ambulatory Visit (HOSPITAL_COMMUNITY): Payer: Self-pay | Admitting: Neurology

## 2021-12-28 ENCOUNTER — Other Ambulatory Visit: Payer: Self-pay | Admitting: Neurology

## 2021-12-28 DIAGNOSIS — R519 Headache, unspecified: Secondary | ICD-10-CM

## 2022-01-04 ENCOUNTER — Ambulatory Visit: Payer: PPO | Admitting: Podiatry

## 2022-01-04 ENCOUNTER — Other Ambulatory Visit: Payer: Self-pay

## 2022-01-04 DIAGNOSIS — M722 Plantar fascial fibromatosis: Secondary | ICD-10-CM

## 2022-01-04 NOTE — Progress Notes (Signed)
Subjective:  Patient ID: Dawn Fuentes, female    DOB: May 11, 1946,  MRN: [id number]  Chief Complaint  Patient presents with   Plantar Fasciitis    Pt stated that she is doing much better she denies any pain or discomfort at this time     76 y.o. female presents with the above complaint.  Patient presents with follow-up of left Planter fasciitis.  She states she is doing a lot better.  She no longer has any residual pain.  She is about 95% healed.  She already has orthotics and has broken them in..   Review of Systems: Negative except as noted in the HPI. Denies N/V/F/Ch.  Past Medical History:  Diagnosis Date   Allergy    Anxiety    ER visit in Sept 2017 for anxiety   Arthritis    Benign parotid tumor    Chronic insomnia    Colon adenoma    Dyslipidemia    Dysphagia    Female stress incontinence    GERD (gastroesophageal reflux disease)    Glaucoma, both eyes    Dr. Wallace Going- [facility]   Headache    Hyperlipidemia    Hypertension    controlled on meds   Hypothyroidism    Menopause syndrome    Osteoporosis    Polyp of colon    Renal insufficiency    mild- keeping an eye on   Vitamin D deficiency     Current Outpatient Medications:    ALPRAZolam (XANAX) 0.25 MG tablet, Take 1 tablet (0.25 mg total) by mouth daily as needed for anxiety. Rarely, Disp: 30 tablet, Rfl: 0   Calcium Carb-Cholecalciferol 600-800 MG-UNIT TABS, Take 1 tablet by mouth daily., Disp: , Rfl:    citalopram (CELEXA) 20 MG tablet, Take 1 tablet (20 mg total) by mouth daily., Disp: 90 tablet, Rfl: 1   clobetasol ointment (TEMOVATE) 0.05 %, Apply to affected area every night for 4 weeks, then every other day for 4 weeks and then twice a week for 4 weeks or until resolution. (Patient taking differently: Apply 1 application topically daily as needed (Vaginal). Apply to affected area every night for 4 weeks, then every other day for 4 weeks and then twice a week for 4 weeks or until  resolution.), Disp: 30 g, Rfl: 5   Cyanocobalamin (VITAMIN B-12 IJ), Inject as directed every 30 (thirty) days., Disp: , Rfl:    denosumab (PROLIA) 60 MG/ML SOSY injection, Inject 60 mg into the skin every 6 (six) months., Disp: , Rfl:    hydrALAZINE (APRESOLINE) 25 MG tablet, TAKE 1 TABLET BY MOUTH THREE TIMES DAILY; IF BLOOD PRESSURE ABOVE 140/90 (Patient taking differently: Take 25 mg by mouth daily.), Disp: 270 tablet, Rfl: 0   levocetirizine (XYZAL) 5 MG tablet, Take 1 tablet (5 mg total) by mouth every evening., Disp: 90 tablet, Rfl: 1   levothyroxine (SYNTHROID) 50 MCG tablet, TAKE 2 TABLETS BY MOUTH DAILY BEFORE BREAKFAST ON MONDAY, WEDNESDAY, FRIDAY, AND 1 TABLET ON ALL OTHER DAYS OF THE WEEK, Disp: 128 tablet, Rfl: 0   losartan-hydrochlorothiazide (HYZAAR) 100-12.5 MG tablet, TAKE 1 TABLET BY MOUTH DAILY, Disp: 90 tablet, Rfl: 0   methocarbamol (ROBAXIN) 500 MG tablet, Take 1 tablet (500 mg total) by mouth every 8 (eight) hours as needed for muscle spasms., Disp: 60 tablet, Rfl: 2   nitrofurantoin, macrocrystal-monohydrate, (MACROBID) 100 MG capsule, Take 1 capsule (100 mg total) by mouth 2 (two) times daily., Disp: 10 capsule, Rfl: 1   Omega-3  Fatty Acids (FISH OIL) 1000 MG CAPS, Take 1,000 mg by mouth daily., Disp: , Rfl:    pantoprazole (PROTONIX) 40 MG tablet, TAKE 1 TABLET(40 MG) BY MOUTH DAILY (Patient taking differently: Take 40 mg by mouth daily.), Disp: 90 tablet, Rfl: 2   simvastatin (ZOCOR) 40 MG tablet, Take 1 tablet (40 mg total) by mouth every evening., Disp: 90 tablet, Rfl: 1   timolol (TIMOPTIC) 0.5 % ophthalmic solution, Place 1 drop into both eyes 2 (two) times daily., Disp: , Rfl: 5   Turmeric 500 MG TABS, Take 500 mg by mouth in the morning and at bedtime., Disp: , Rfl:    VYZULTA 0.024 % SOLN, Place 1 drop into both eyes at bedtime., Disp: , Rfl: 2   zolpidem (AMBIEN) 5 MG tablet, TAKE 1 TABLET(5 MG) BY MOUTH AT BEDTIME, Disp: 90 tablet, Rfl: 0  Current  Facility-Administered Medications:    cyanocobalamin ((VITAMIN B-12)) injection 1,000 mcg, 1,000 mcg, Intramuscular, Q30 days, Steele Sizer, MD, 1,000 mcg at 12/07/21 1512  Social History   Tobacco Use  Smoking Status Never  Smokeless Tobacco Never  Tobacco Comments   smoking cessation materials not required    Allergies  Allergen Reactions   Brimonidine Tartrate Other (See Comments)    Burning in eyes   Latanoprost Other (See Comments)    burning   Meloxicam     Increased blood pressure    Raloxifene     bone pain   Objective:  There were no vitals filed for this visit. There is no height or weight on file to calculate BMI. Constitutional Well developed. Well nourished.  Vascular Dorsalis pedis pulses palpable bilaterally. Posterior tibial pulses palpable bilaterally. Capillary refill normal to all digits.  No cyanosis or clubbing noted. Pedal hair growth normal.  Neurologic Normal speech. Oriented to person, place, and time. Epicritic sensation to light touch grossly present bilaterally.  Dermatologic Nails well groomed and normal in appearance. No open wounds. No skin lesions.  Orthopedic: Normal joint ROM without pain or crepitus bilaterally. No visible deformities. No further tender to palpation at the calcaneal tuber left. No pain with calcaneal squeeze left. Ankle ROM diminished range of motion left. Silfverskiold Test: positive left.   Radiographs: None  Assessment:   1. Plantar fasciitis, left      Plan:  Patient was evaluated and treated and all questions answered.  Plantar Fasciitis, left with underlying gastrocnemius equinus -Clinically healed.  At this time I discussed shoe gear modification as well as orthotics.  I discussed with her that the foot and ankle pain or plantar fasciitis comes back.  Come back and see me.  She states understanding.  Pes cavus -I explained to patient the etiology of pes planovalgus and relationship with pes  cavus and various treatment options were discussed.  Given patient foot structure in the setting of Planter fasciitis I believe patient will benefit from custom-made orthotics to help control the hindfoot motion support the arch of the foot and take the stress away from plantar fascial.  Patient agrees with the plan like to proceed with orthotics -Orthotics were dispensed and functioning well    No follow-ups on file.

## 2022-01-06 ENCOUNTER — Other Ambulatory Visit: Payer: Self-pay

## 2022-01-06 ENCOUNTER — Ambulatory Visit
Admission: RE | Admit: 2022-01-06 | Discharge: 2022-01-06 | Disposition: A | Discharge status: Home / Self Care | Payer: PPO | Source: Ambulatory Visit | Attending: Neurology | Admitting: Neurology

## 2022-01-06 DIAGNOSIS — I6782 Cerebral ischemia: Secondary | ICD-10-CM | POA: Diagnosis not present

## 2022-01-06 DIAGNOSIS — R519 Headache, unspecified: Secondary | ICD-10-CM | POA: Diagnosis present

## 2022-01-10 ENCOUNTER — Ambulatory Visit (INDEPENDENT_AMBULATORY_CARE_PROVIDER_SITE_OTHER): Payer: PPO

## 2022-01-10 DIAGNOSIS — E538 Deficiency of other specified B group vitamins: Secondary | ICD-10-CM

## 2022-01-10 MED ORDER — CYANOCOBALAMIN 1000 MCG/ML IJ SOLN
1000.0000 ug | Freq: Once | INTRAMUSCULAR | Status: AC
  Administered 2022-01-10: 1000 ug via INTRAMUSCULAR

## 2022-01-17 DIAGNOSIS — M810 Age-related osteoporosis without current pathological fracture: Secondary | ICD-10-CM | POA: Diagnosis not present

## 2022-01-21 ENCOUNTER — Other Ambulatory Visit: Payer: Self-pay | Admitting: Family Medicine

## 2022-01-21 DIAGNOSIS — K219 Gastro-esophageal reflux disease without esophagitis: Secondary | ICD-10-CM

## 2022-01-21 DIAGNOSIS — E785 Hyperlipidemia, unspecified: Secondary | ICD-10-CM

## 2022-01-21 DIAGNOSIS — F411 Generalized anxiety disorder: Secondary | ICD-10-CM

## 2022-01-21 MED ORDER — SIMVASTATIN 40 MG PO TABS: 40.0000 mg | ORAL_TABLET | Freq: Every evening | ORAL | 1 refills | Status: DC

## 2022-01-21 MED ORDER — PANTOPRAZOLE SODIUM 40 MG PO TBEC: 40.0000 mg | DELAYED_RELEASE_TABLET | Freq: Every day | ORAL | 1 refills | Status: DC

## 2022-01-21 NOTE — Telephone Encounter (Signed)
Requested medication (s) are due for refill today:   Yes for all 3  Requested medication (s) are on the active medication list:   Yes for all 3  Future visit scheduled:   Yes in 1 mo. With Dr. Ancil Boozer   Last ordered: Xanax 09/10/2021 #30, 0 refills;   Protonix 02/16/2021 #90, 2 refills;   Zocor 09/10/2021 #90, 1 refill  Returned because Xanax is a non delegated refill and urine drug screen is due;  Zocor did not meet protocol criteria   Requested Prescriptions  Pending Prescriptions Disp Refills   ALPRAZolam (XANAX) 0.25 MG tablet 30 tablet 0    Sig: Take 1 tablet (0.25 mg total) by mouth daily as needed for anxiety. Rarely     Not Delegated - Psychiatry: Anxiolytics/Hypnotics 2 Failed - 01/21/2022  1:14 PM      Failed - This refill cannot be delegated      Failed - Urine Drug Screen completed in last 360 days      Passed - Patient is not pregnant      Passed - Valid encounter within last 6 months    Recent Outpatient Visits           4 months ago Stage 3a chronic kidney disease [facility])   [facility] Steele Sizer, MD   10 months ago Vitamin D deficiency   [facility] Steele Sizer, MD   1 year ago Well adult exam   [facility] Steele Sizer, MD   1 year ago Cervical radiculitis   [facility] Steele Sizer, MD   1 year ago Benign hypertension   [facility] Steele Sizer, MD       Future Appointments             In 1 month Steele Sizer, MD [facility]             simvastatin (ZOCOR) 40 MG tablet 90 tablet 1    Sig: Take 1 tablet (40 mg total) by mouth every evening.     Cardiovascular:  Antilipid - Statins Failed - 01/21/2022  1:14 PM      Failed - Lipid Panel in normal range within the last 12 months    Cholesterol, Total  Date Value Ref Range Status  04/05/2016 144 100 - 199 mg/dL Final   Cholesterol  Date Value Ref Range Status   04/15/2021 146 <200 mg/dL Final   LDL Cholesterol (Calc)  Date Value Ref Range Status  04/15/2021 69 mg/dL (calc) Final    Comment:    Reference range: <100 . Desirable range <100 mg/dL for primary prevention;   <70 mg/dL for patients with CHD or diabetic patients  with > or = 2 CHD risk factors. Marland Kitchen LDL-C is now calculated using the Martin-Hopkins  calculation, which is a validated novel method providing  better accuracy than the Friedewald equation in the  estimation of LDL-C.  Cresenciano Genre et al. Annamaria Helling. 1194;174(08): 2061-2068  (http://education.[employer].com/faq/FAQ164)    HDL  Date Value Ref Range Status  04/15/2021 57 > OR = 50 mg/dL Final  04/05/2016 55 >39 mg/dL Final   Triglycerides  Date Value Ref Range Status  04/15/2021 118 <150 mg/dL Final         Passed - Patient is not pregnant      Passed - Valid encounter within last 12 months    Recent Outpatient Visits  4 months ago Stage 3a chronic kidney disease [facility])   [facility] Steele Sizer, MD   10 months ago Vitamin D deficiency   [facility] Steele Sizer, MD   1 year ago Well adult exam   [facility] Steele Sizer, MD   1 year ago Cervical radiculitis   [facility] Steele Sizer, MD   1 year ago Benign hypertension   [facility] Steele Sizer, MD       Future Appointments             In 1 month Steele Sizer, MD [facility]             pantoprazole (PROTONIX) 40 MG tablet 90 tablet 2     Gastroenterology: Proton Pump Inhibitors Passed - 01/21/2022  1:14 PM      Passed - Valid encounter within last 12 months    Recent Outpatient Visits           4 months ago Stage 3a chronic kidney disease [facility])   [facility] Steele Sizer, MD   10 months ago Vitamin D deficiency   [facility] Steele Sizer, MD    1 year ago Well adult exam   [facility] Steele Sizer, MD   1 year ago Cervical radiculitis   [facility] Steele Sizer, MD   1 year ago Benign hypertension   [facility] Steele Sizer, MD       Future Appointments             In 1 month Steele Sizer, MD [facility]

## 2022-01-21 NOTE — Telephone Encounter (Signed)
Medication: simvastatin (ZOCOR) 40 MG tablet [[id number]] , pantoprazole (PROTONIX) 40 MG tablet [[id number]] , ALPRAZolam (XANAX) 0.25 MG tablet [[id number]] ,  Has the patient contacted their pharmacy? YES  (Agent: If no, request that the patient contact the pharmacy for the refill. If patient does not wish to contact the pharmacy document the reason why and proceed with request.) (Agent: If yes, when and what did the pharmacy advise?)  Preferred Pharmacy (with phone number or street name): [facility], [place] [address] Phone: [phone] Fax: [phone] Hours: Not open 24 hours   Has the patient been seen for an appointment in the last year OR does the patient have an upcoming appointment? YES 03/08/22  Agent: Please be advised that RX refills may take up to 3 business days. We ask that you follow-up with your pharmacy.

## 2022-01-25 ENCOUNTER — Other Ambulatory Visit: Payer: Self-pay | Admitting: Family Medicine

## 2022-01-25 DIAGNOSIS — E039 Hypothyroidism, unspecified: Secondary | ICD-10-CM

## 2022-01-25 NOTE — Telephone Encounter (Signed)
Copied from [place] [phone]. Topic: Quick Communication - Rx Refill/Question >> Jan 25, 2022 10:32 AM Loma Boston wrote: Medication: levothyroxine (SYNTHROID) 50 MCG tablet 128 tablet 0 10/25/2021   Sig: TAKE 2 TABLETS BY MOUTH DAILY BEFORE BREAKFAST ON MONDAY, WEDNESDAY, FRIDAY, AND 1 TABLET ON ALL OTHER DAYS OF THE WEEK  Sent to pharmacy as: levothyroxine (SYNTHROID) 50 MCG tablet  E-Prescribing Status: Receipt confirmed by pharmacy (10/25/2021 8:31 PM EST)     Has the patient contacted their pharmacy? Yes.  Pt has changed pharmacies (Agent: If no, request that the patient contact the pharmacy for the refill. If patient does not wish to contact the pharmacy document the reason why and proceed with request.) (Agent: If yes, when and what did the pharmacy advise?) call dr  Preferred Pharmacy (with phone number or street name): [address], [place] [address] Phone: [phone] Fax: [phone] Hours: Not open 24 hours   Has the patient been seen for an appointment in the last year OR does the patient have an upcoming appointment? Yes.    Agent: Please be advised that RX refills may take up to 3 business days. We ask that you follow-up with your pharmacy.

## 2022-01-25 NOTE — Telephone Encounter (Signed)
° °  Requested medication (s) are on the active medication list: yes  Last refill:  10/25/21 for #128 with 0 RF  Future visit scheduled: 03/08/22  Notes to clinic:  Changing pharmacy, request does not have signature, please assess.   Requested Prescriptions  Pending Prescriptions Disp Refills   levothyroxine (SYNTHROID) 50 MCG tablet 128 tablet 0     Endocrinology:  Hypothyroid Agents Passed - 01/25/2022 12:10 PM      Passed - TSH in normal range and within 360 days    TSH  Date Value Ref Range Status  04/15/2021 0.80 0.40 - 4.50 mIU/L Final          Passed - Valid encounter within last 12 months    Recent Outpatient Visits           4 months ago Stage 3a chronic kidney disease [facility])   [facility] Steele Sizer, MD   10 months ago Vitamin D deficiency   [facility] Steele Sizer, MD   1 year ago Well adult exam   [facility] Steele Sizer, MD   1 year ago Cervical radiculitis   [facility] Steele Sizer, MD   1 year ago Benign hypertension   [facility] Steele Sizer, MD       Future Appointments             In 1 month Ancil Boozer, Drue Stager, MD [facility]

## 2022-01-26 MED ORDER — LEVOTHYROXINE SODIUM 50 MCG PO TABS: ORAL_TABLET | ORAL | 0 refills | Status: DC

## 2022-01-26 NOTE — Telephone Encounter (Signed)
Routed to wrong practice.  Refilled per protocol. Requested Prescriptions  Pending Prescriptions Disp Refills   levothyroxine (SYNTHROID) 50 MCG tablet 128 tablet 0     Endocrinology:  Hypothyroid Agents Passed - 01/26/2022  8:41 AM      Passed - TSH in normal range and within 360 days    TSH  Date Value Ref Range Status  04/15/2021 0.80 0.40 - 4.50 mIU/L Final          Passed - Valid encounter within last 12 months    Recent Outpatient Visits           4 months ago Stage 3a chronic kidney disease [facility])   [facility] Steele Sizer, MD   10 months ago Vitamin D deficiency   [facility] Steele Sizer, MD   1 year ago Well adult exam   [facility] Steele Sizer, MD   1 year ago Cervical radiculitis   [facility] Steele Sizer, MD   1 year ago Benign hypertension   [facility] Steele Sizer, MD       Future Appointments             In 1 month Ancil Boozer, Drue Stager, MD [facility]

## 2022-02-10 ENCOUNTER — Ambulatory Visit (INDEPENDENT_AMBULATORY_CARE_PROVIDER_SITE_OTHER): Payer: PPO

## 2022-02-10 ENCOUNTER — Other Ambulatory Visit: Payer: Self-pay

## 2022-02-10 DIAGNOSIS — E538 Deficiency of other specified B group vitamins: Secondary | ICD-10-CM

## 2022-02-10 MED ORDER — CYANOCOBALAMIN 1000 MCG/ML IJ SOLN
1000.0000 ug | Freq: Once | INTRAMUSCULAR | Status: AC
  Administered 2022-02-10: 1000 ug via INTRAMUSCULAR

## 2022-03-07 DIAGNOSIS — H401132 Primary open-angle glaucoma, bilateral, moderate stage: Secondary | ICD-10-CM | POA: Diagnosis not present

## 2022-03-07 NOTE — Progress Notes (Addendum)
 Name: Dawn Fuentes   MRN: [id number]    DOB: 26-Jan-1946   Date:03/08/2022       Progress Note  Subjective  Chief Complaint  Follow Up  HPI  GAD: she is feeling much better on Citalopram  20 mg, she takes alprazolam  seldom, at most once a week, no side effects She wants to continue medications  Insomnia: she takes Ambien  most of the time.    Hypothyroidism: she is taking medication as prescribed, no hair loss, palpitation or change in bowel movements . She is taking two pills M, W and Fridays and one daily.We will recheck labs today    DDD cervical spine with radiculitis, seen by Dr. Malcolm and started having PT at [facility] in the past and noticed some improvement of frequency of headaches, she still has radiculitis going down to upper arm but only when rotating to right side. She was seeing  Dr. Marcelino had steroid injection done in 12/2020 , it relieved the headache for one day but helped with arm radiculitis. She also had a nerve block 01/2021 and it only helped with headache for one day . She was referred by Dr. Marcelino to Dr. Blair due to a .parotid mass found on CT , she had the parotid adenoma removed May 2022 but still has some scar tissue under the incision. She was seen by Dr. Maree and tried medications that did not work, he advised her to see Dr. Ila for acupuncture and headaches are less frequent but still daily , it can go up to 8-12 hours in between., tylenol  improves symptoms if she takes it right away  . She states he tried her on Topamax  but had to stop because of side effects and also developed paresthesia on left hand ( she is right hand dominant) no weakness. She tried Lyrica  and Topamax . She has a follow up in the next couple of weeks with neurologist   MRI brain 01/23  IMPRESSION:  No evidence of acute intracranial abnormality.  Mild chronic small vessel ischemic changes within the cerebral white  matter.  Otherwise unremarkable non-contrast MRI appearance of the brain  for  age.   HTN/CKI: stage III, normal urine output, no pruritis, bp has been at goal, no chest pain, palpitation or SOB. BP at home today was 120/75   Dyslipidemia: taking simvastatin , last LDL was at goal down from 897 to 69  denies chest pain or palpitation . We will recheck levels today   Hyperglycemia: A1C was up from 5.5 % to 6.2 % , discussed life style modification , we will recheck labs today   Patient Active Problem List   Diagnosis Date Noted   Neck swelling 07/06/2021   Chronic nonintractable headache 07/06/2021   Cervicogenic migraine 01/14/2021   Cervical facet joint syndrome 01/14/2021   Cervical radiculitis 12/01/2020   Bilateral hip pain 12/01/2020   Chronic bilateral low back pain without sciatica 12/01/2020   Chronic SI joint pain 12/01/2020   Chronic pain syndrome 12/01/2020   Body mass index (BMI) 30.0-30.9, adult 11/18/2020   Essential (primary) hypertension 11/18/2020   Cervical radicular pain 11/18/2020   Degeneration of cervical intervertebral disc 06/25/2020   Lichen sclerosus et atrophicus of the vulva 11/04/2019   [name] deficiency 12/26/2018   Osteoarthritis of knees, bilateral 05/26/2017   Primary open angle glaucoma of both eyes 11/21/2016   Chronic kidney disease (CKD), stage III (moderate) (HCC) 04/06/2016   Cystitis 10/12/2015   Benign hypertension 08/21/2015   Insomnia, persistent 08/21/2015  Dyslipidemia 08/21/2015   Gastro-esophageal reflux disease without esophagitis 08/21/2015   Glaucoma 08/21/2015   Blood glucose elevated 08/21/2015   Adult hypothyroidism 08/21/2015   Climacteric 08/21/2015   Senile osteoporosis 08/21/2015   Seasonal allergies 08/21/2015   Female genuine stress incontinence 08/21/2015   Vitamin D  deficiency 08/21/2015    Past Surgical History:  Procedure Laterality Date   ABDOMINAL HYSTERECTOMY     21 years ago   APPENDECTOMY     COLONOSCOPY N/A 09/14/2020   Procedure: COLONOSCOPY;  Surgeon: Maryruth Ole DASEN,  MD;  Location: [facility] ENDOSCOPY;  Service: Endoscopy;  Laterality: N/A;   COLONOSCOPY WITH PROPOFOL  N/A 05/29/2015   Procedure: COLONOSCOPY WITH PROPOFOL ;  Surgeon: Deward CINDERELLA Piedmont, MD;  Location: [facility] ENDOSCOPY;  Service: Gastroenterology;  Laterality: N/A;   ESOPHAGOGASTRODUODENOSCOPY N/A 05/29/2015   Procedure: ESOPHAGOGASTRODUODENOSCOPY (EGD);  Surgeon: Deward CINDERELLA Piedmont, MD;  Location: [facility] ENDOSCOPY;  Service: Gastroenterology;  Laterality: N/A;   ESOPHAGOGASTRODUODENOSCOPY N/A 09/14/2020   Procedure: ESOPHAGOGASTRODUODENOSCOPY (EGD);  Surgeon: Maryruth Ole DASEN, MD;  Location: [facility] END[facility]COPY;  Service: Endoscopy;  Laterality: N/A;   EYE SURGERY     eye lids   OVARIAN CYST REMOVAL     needed transfusion   PAROTIDECTOMY Right 05/05/2021   Procedure: SUPERFICIAL PAROTIDECTOMY;  Surgeon: Blair Deward, MD;  Location: [facility] ORS;  Service: ENT;  Laterality: Right;   PHOTOCOAGULATION WITH LASER Right 11/21/2016   Procedure: PHOTOCOAGULATION WITH LASER;  Surgeon: Donzell Arlyce Budd, MD;  Location: [facility];  Service: Ophthalmology;  Laterality: Right;  A micropulse probe was applied to each hemilimbus with the following settings: , 31.3% duty cycle, 90 seconds x 3 applications.    TONSILLECTOMY      Family History  Problem Relation Age of Onset   Hypertension Mother    Osteoporosis Mother    COPD Mother    Dementia Mother    Cancer Father        Colon   Hypothyroidism Daughter    Aneurysm Brother    COPD Sister     Social History   Tobacco Use   Smoking status: Never   Smokeless tobacco: Never   Tobacco comments:    smoking cessation materials not required  Substance Use Topics   Alcohol use: No    Alcohol/week: 0.0 standard drinks     Current Outpatient Medications:    ALPRAZolam  (XANAX ) 0.25 MG tablet, Take 1 tablet (0.25 mg total) by mouth daily as needed for anxiety. Rarely, Disp: 30 tablet, Rfl: 0   Calcium Carb-Cholecalciferol 600-800 MG-UNIT TABS, Take 1 tablet  by mouth daily., Disp: , Rfl:    citalopram  (CELEXA ) 20 MG tablet, Take 1 tablet (20 mg total) by mouth daily., Disp: 90 tablet, Rfl: 1   clobetasol  ointment (TEMOVATE ) 0.05 %, Apply to affected area every night for 4 weeks, then every other day for 4 weeks and then twice a week for 4 weeks or until resolution. (Patient taking differently: Apply 1 application. topically daily as needed (Vaginal). Apply to affected area every night for 4 weeks, then every other day for 4 weeks and then twice a week for 4 weeks or until resolution.), Disp: 30 g, Rfl: 5   Cyanocobalamin  (VITAMIN B-12 IJ), Inject as directed every 30 (thirty) days., Disp: , Rfl:    denosumab  (PROLIA ) 60 MG/ML SOSY injection, Inject 60 mg into the skin every 6 (six) months., Disp: , Rfl:    hydrALAZINE  (APRESOLINE ) 25 MG tablet, TAKE 1 TABLET BY MOUTH THREE TIMES DAILY; IF BLOOD  PRESSURE ABOVE 140/90 (Patient taking differently: Take 25 mg by mouth daily.), Disp: 270 tablet, Rfl: 0   levocetirizine (XYZAL ) 5 MG tablet, Take 1 tablet (5 mg total) by mouth every evening., Disp: 90 tablet, Rfl: 1   levothyroxine  (SYNTHROID ) 50 MCG tablet, TAKE 2 TABLETS BY MOUTH DAILY BEFORE BREAKFAST ON MONDAY, WEDNESDAY, FRIDAY, AND 1 TABLET ON ALL OTHER DAYS OF THE WEEK Strength: 50 mcg, Disp: 128 tablet, Rfl: 0   losartan -hydrochlorothiazide  (HYZAAR) 100-12.5 MG tablet, TAKE 1 TABLET BY MOUTH DAILY, Disp: 90 tablet, Rfl: 0   methocarbamol  (ROBAXIN ) 500 MG tablet, Take 1 tablet (500 mg total) by mouth every 8 (eight) hours as needed for muscle spasms., Disp: 60 tablet, Rfl: 2   nitrofurantoin , macrocrystal-monohydrate, (MACROBID ) 100 MG capsule, Take 1 capsule (100 mg total) by mouth 2 (two) times daily., Disp: 10 capsule, Rfl: 1   Omega-3 Fatty Acids (FISH OIL) 1000 MG CAPS, Take 1,000 mg by mouth daily., Disp: , Rfl:    pantoprazole  (PROTONIX ) 40 MG tablet, Take 1 tablet (40 mg total) by mouth daily., Disp: 90 tablet, Rfl: 1   simvastatin  (ZOCOR ) 40 MG  tablet, Take 1 tablet (40 mg total) by mouth every evening., Disp: 90 tablet, Rfl: 1   timolol (TIMOPTIC) 0.5 % ophthalmic solution, Place 1 drop into both eyes 2 (two) times daily., Disp: , Rfl: 5   Turmeric 500 MG TABS, Take 500 mg by mouth in the morning and at bedtime., Disp: , Rfl:    VYZULTA 0.024 % SOLN, Place 1 drop into both eyes at bedtime., Disp: , Rfl: 2   zolpidem  (AMBIEN ) 5 MG tablet, TAKE 1 TABLET(5 MG) BY MOUTH AT BEDTIME, Disp: 90 tablet, Rfl: 0  Current Facility-Administered Medications:    cyanocobalamin  ((VITAMIN B-12)) injection 1,000 mcg, 1,000 mcg, Intramuscular, Q30 days, Cherity Blickenstaff, MD, 1,000 mcg at 12/07/21 1512  Allergies  Allergen Reactions   Brimonidine Tartrate Other (See Comments)    Burning in eyes   Latanoprost Other (See Comments)    burning   Meloxicam      Increased blood pressure    Raloxifene     bone pain    I personally reviewed active problem list, medication list, allergies, family history, social history, health maintenance with the patient/caregiver today.   ROS  Constitutional: Negative for fever or weight change.  Respiratory: Negative for cough and shortness of breath.   Cardiovascular: Negative for chest pain or palpitations.  Gastrointestinal: Negative for abdominal pain, no bowel changes.  Musculoskeletal: Negative for gait problem or joint swelling.  Skin: Negative for rash.  Neurological: Negative for dizziness, positive for  headache.  No other specific complaints in a complete review of systems (except as listed in HPI above).   Objective  Vitals:   03/08/22 1340  BP: 130/72  Pulse: 87  Resp: 16  SpO2: 95%  Weight: 177 lb (80.3 kg)  Height: 5' 5 (1.651 m)    Body mass index is 29.45 kg/m.  Physical Exam  Constitutional: Patient appears well-developed and well-nourished. Overweight  No distress.  HEENT: head atraumatic, normocephalic, pupils equal and reactive to light, neck supple fullness on right side  of neck, below mandibular angle, over right sternocleidomastoid muscle Cardiovascular: Normal rate, regular rhythm and normal heart sounds.  No murmur heard. No BLE edema. Pulmonary/Chest: Effort normal and breath sounds normal. No respiratory distress. Abdominal: Soft.  There is no tenderness. Psychiatric: Patient has a normal mood and affect. behavior is normal. Judgment and thought content normal.  PHQ2/9: Depression screen [facility] 2/9 03/08/2022 09/16/2021 09/10/2021 07/06/2021 03/30/2021  Decreased Interest 0 0 0 0 0  Down, Depressed, Hopeless 0 0 0 0 0  PHQ - 2 Score 0 0 0 0 0  Altered sleeping 0 - 1 - -  Tired, decreased energy 0 - 0 - -  Change in appetite 0 - 0 - -  Feeling bad or failure about yourself  0 - 0 - -  Trouble concentrating 0 - 0 - -  Moving slowly or fidgety/restless 0 - 0 - -  Suicidal thoughts 0 - 0 - -  PHQ-9 Score 0 - 1 - -  Difficult doing work/chores - - Not difficult at all - -  Some recent data might be hidden    phq 9 is negative   Fall Risk: Fall Risk  03/08/2022 11/16/2021 09/16/2021 09/10/2021 08/17/2021  Falls in the past year? 0 0 0 0 0  Number falls in past yr: 0 - 0 - -  Injury with Fall? 0 - 0 - -  Risk for fall due to : No Fall Risks - No Fall Risks - -  Risk for fall due to: Comment - - - - -  Follow up Falls prevention discussed - Falls prevention discussed Falls prevention discussed -      Functional Status Survey: Is the patient deaf or have difficulty hearing?: No Does the patient have difficulty seeing, even when wearing glasses/contacts?: No Does the patient have difficulty concentrating, remembering, or making decisions?: No Does the patient have difficulty walking or climbing stairs?: No Does the patient have difficulty dressing or bathing?: No Does the patient have difficulty doing errands alone such as visiting a doctor's office or shopping?: No    Assessment & Plan  1. GAD (generalized anxiety disorder)  - ALPRAZolam  (XANAX )  0.25 MG tablet; Take 1 tablet (0.25 mg total) by mouth daily as needed for anxiety. Rarely  Dispense: 30 tablet; Refill: 0 - citalopram  (CELEXA ) 20 MG tablet; Take 1 tablet (20 mg total) by mouth daily.  Dispense: 90 tablet; Refill: 1  2. Benign hypertension  - losartan -hydrochlorothiazide  (HYZAAR) 100-12.5 MG tablet; Take 1 tablet by mouth daily.  Dispense: 90 tablet; Refill: 1 - COMPLETE METABOLIC PANEL WITH GFR  3. Insomnia, persistent  - zolpidem  (AMBIEN ) 5 MG tablet; Take 1 tablet (5 mg total) by mouth at bedtime.  Dispense: 90 tablet; Refill: 1  4. Adult hypothyroidism  - levothyroxine  (SYNTHROID ) 50 MCG tablet; TAKE 2 TABLETS BY MOUTH DAILY BEFORE BREAKFAST ON MONDAY, WEDNESDAY, FRIDAY, AND 1 TABLET ON ALL OTHER DAYS OF THE WEEK Strength: 50 mcg  Dispense: 128 tablet; Refill: 1 - TSH  5. Recurrent UTI  - nitrofurantoin , macrocrystal-monohydrate, (MACROBID ) 100 MG capsule; Take 1 capsule (100 mg total) by mouth 2 (two) times daily.  Dispense: 10 capsule; Refill: 1  6. Stage 3a chronic kidney disease (HCC)   7. Vitamin [name] deficiency   8. Vitamin D  deficiency   9. Gastro-esophageal reflux disease without esophagitis   10. Lichen simplex  - clobetasol  ointment (TEMOVATE ) 0.05 %; Apply 1 application. topically 2 (two) times a week.  Dispense: 30 g; Refill: 2  11. Seasonal allergies   12. Other iron deficiency anemia  - CBC with Differential/Platelet - Iron, TIBC and Ferritin Panel  13. Hyperglycemia  - Hemoglobin A1c  14. Dyslipidemia  - Lipid panel  15. Nonintractable episodic headache, unspecified headache type

## 2022-03-08 ENCOUNTER — Ambulatory Visit (INDEPENDENT_AMBULATORY_CARE_PROVIDER_SITE_OTHER): Payer: PPO | Admitting: Family Medicine

## 2022-03-08 ENCOUNTER — Encounter: Payer: Self-pay | Admitting: Family Medicine

## 2022-03-08 VITALS — BP 130/72 | HR 87 | Resp 16 | Ht 65.0 in | Wt 177.0 lb

## 2022-03-08 DIAGNOSIS — R739 Hyperglycemia, unspecified: Secondary | ICD-10-CM

## 2022-03-08 DIAGNOSIS — E039 Hypothyroidism, unspecified: Secondary | ICD-10-CM | POA: Diagnosis not present

## 2022-03-08 DIAGNOSIS — D508 Other iron deficiency anemias: Secondary | ICD-10-CM

## 2022-03-08 DIAGNOSIS — N390 Urinary tract infection, site not specified: Secondary | ICD-10-CM

## 2022-03-08 DIAGNOSIS — N1831 Chronic kidney disease, stage 3a: Secondary | ICD-10-CM

## 2022-03-08 DIAGNOSIS — G4700 Insomnia, unspecified: Secondary | ICD-10-CM

## 2022-03-08 DIAGNOSIS — L280 Lichen simplex chronicus: Secondary | ICD-10-CM | POA: Diagnosis not present

## 2022-03-08 DIAGNOSIS — E538 Deficiency of other specified B group vitamins: Secondary | ICD-10-CM | POA: Diagnosis not present

## 2022-03-08 DIAGNOSIS — F411 Generalized anxiety disorder: Secondary | ICD-10-CM

## 2022-03-08 DIAGNOSIS — R221 Localized swelling, mass and lump, neck: Secondary | ICD-10-CM

## 2022-03-08 DIAGNOSIS — I10 Essential (primary) hypertension: Secondary | ICD-10-CM

## 2022-03-08 DIAGNOSIS — K219 Gastro-esophageal reflux disease without esophagitis: Secondary | ICD-10-CM

## 2022-03-08 DIAGNOSIS — E559 Vitamin D deficiency, unspecified: Secondary | ICD-10-CM

## 2022-03-08 DIAGNOSIS — R519 Headache, unspecified: Secondary | ICD-10-CM

## 2022-03-08 DIAGNOSIS — J302 Other seasonal allergic rhinitis: Secondary | ICD-10-CM

## 2022-03-08 DIAGNOSIS — E785 Hyperlipidemia, unspecified: Secondary | ICD-10-CM

## 2022-03-08 MED ORDER — LEVOTHYROXINE SODIUM 50 MCG PO TABS: ORAL_TABLET | ORAL | 1 refills | Status: DC

## 2022-03-08 MED ORDER — NITROFURANTOIN MONOHYD MACRO 100 MG PO CAPS: 100.0000 mg | ORAL_CAPSULE | Freq: Two times a day (BID) | ORAL | 1 refills | Status: DC

## 2022-03-08 MED ORDER — ZOLPIDEM TARTRATE 5 MG PO TABS: 5.0000 mg | ORAL_TABLET | Freq: Every day | ORAL | 1 refills | Status: DC

## 2022-03-08 MED ORDER — CITALOPRAM HYDROBROMIDE 20 MG PO TABS: 20.0000 mg | ORAL_TABLET | Freq: Every day | ORAL | 1 refills | Status: DC

## 2022-03-08 MED ORDER — ALPRAZOLAM 0.25 MG PO TABS: 0.2500 mg | ORAL_TABLET | Freq: Every day | ORAL | 0 refills | Status: DC | PRN

## 2022-03-08 MED ORDER — LOSARTAN POTASSIUM-HCTZ 100-12.5 MG PO TABS: 1.0000 | ORAL_TABLET | Freq: Every day | ORAL | 1 refills | Status: DC

## 2022-03-08 MED ORDER — CLOBETASOL PROPIONATE 0.05 % EX OINT: 1.0000 "application " | TOPICAL_OINTMENT | CUTANEOUS | 2 refills | Status: DC

## 2022-03-08 NOTE — Addendum Note (Signed)
Addended by: Steele Sizer F on: 03/08/2022 02:24 PM ? ? Modules accepted: Orders ? ?

## 2022-03-11 ENCOUNTER — Ambulatory Visit: Payer: PPO | Admitting: Family Medicine

## 2022-03-14 ENCOUNTER — Other Ambulatory Visit: Payer: Self-pay | Admitting: Family Medicine

## 2022-03-14 DIAGNOSIS — I10 Essential (primary) hypertension: Secondary | ICD-10-CM

## 2022-03-14 DIAGNOSIS — H401132 Primary open-angle glaucoma, bilateral, moderate stage: Secondary | ICD-10-CM | POA: Diagnosis not present

## 2022-03-18 ENCOUNTER — Ambulatory Visit
Admission: RE | Admit: 2022-03-18 | Discharge: 2022-03-18 | Disposition: A | Discharge status: Home / Self Care | Payer: PPO | Source: Ambulatory Visit | Attending: Family Medicine | Admitting: Family Medicine

## 2022-03-18 DIAGNOSIS — R221 Localized swelling, mass and lump, neck: Secondary | ICD-10-CM | POA: Insufficient documentation

## 2022-03-29 DIAGNOSIS — I10 Essential (primary) hypertension: Secondary | ICD-10-CM | POA: Diagnosis not present

## 2022-03-29 DIAGNOSIS — F458 Other somatoform disorders: Secondary | ICD-10-CM | POA: Diagnosis not present

## 2022-03-29 DIAGNOSIS — R9082 White matter disease, unspecified: Secondary | ICD-10-CM | POA: Diagnosis not present

## 2022-03-29 DIAGNOSIS — E039 Hypothyroidism, unspecified: Secondary | ICD-10-CM | POA: Diagnosis not present

## 2022-03-29 DIAGNOSIS — E785 Hyperlipidemia, unspecified: Secondary | ICD-10-CM | POA: Diagnosis not present

## 2022-03-29 DIAGNOSIS — R519 Headache, unspecified: Secondary | ICD-10-CM | POA: Diagnosis not present

## 2022-03-29 DIAGNOSIS — D508 Other iron deficiency anemias: Secondary | ICD-10-CM | POA: Diagnosis not present

## 2022-03-29 DIAGNOSIS — G44221 Chronic tension-type headache, intractable: Secondary | ICD-10-CM | POA: Diagnosis not present

## 2022-03-29 DIAGNOSIS — R739 Hyperglycemia, unspecified: Secondary | ICD-10-CM | POA: Diagnosis not present

## 2022-03-29 DIAGNOSIS — Z87898 Personal history of other specified conditions: Secondary | ICD-10-CM | POA: Diagnosis not present

## 2022-03-30 LAB — CBC WITH DIFFERENTIAL/PLATELET
Absolute Monocytes: 617 {cells}/uL (ref 200–950)
Basophils Absolute: 102 {cells}/uL (ref 0–200)
Basophils Relative: 2 %
Eosinophils Absolute: 179 {cells}/uL (ref 15–500)
Eosinophils Relative: 3.5 %
HCT: 41.2 % (ref 35.0–45.0)
Hemoglobin: 13.6 g/dL (ref 11.7–15.5)
Lymphs Abs: 1545 {cells}/uL (ref 850–3900)
MCH: 31.8 pg (ref 27.0–33.0)
MCHC: 33 g/dL (ref 32.0–36.0)
MCV: 96.3 fL (ref 80.0–100.0)
MPV: 11.4 fL (ref 7.5–12.5)
Monocytes Relative: 12.1 %
Neutro Abs: 2657 {cells}/uL (ref 1500–7800)
Neutrophils Relative %: 52.1 %
Platelets: 268 10*3/uL (ref 140–400)
RBC: 4.28 Million/uL (ref 3.80–5.10)
RDW: 11.8 % (ref 11.0–15.0)
Total Lymphocyte: 30.3 %
WBC: 5.1 10*3/uL (ref 3.8–10.8)

## 2022-03-30 LAB — COMPLETE METABOLIC PANEL WITHOUT GFR
AG Ratio: 2 (calc) (ref 1.0–2.5)
ALT: 27 U/L (ref 6–29)
AST: 25 U/L (ref 10–35)
Albumin: 4.3 g/dL (ref 3.6–5.1)
Alkaline phosphatase (APISO): 48 U/L (ref 37–153)
BUN/Creatinine Ratio: 16 (calc) (ref 6–22)
BUN: 20 mg/dL (ref 7–25)
CO2: 30 mmol/L (ref 20–32)
Calcium: 9.5 mg/dL (ref 8.6–10.4)
Chloride: 101 mmol/L (ref 98–110)
Creat: 1.23 mg/dL — ABNORMAL HIGH (ref 0.60–1.00)
Globulin: 2.2 g/dL (ref 1.9–3.7)
Glucose, Bld: 110 mg/dL — ABNORMAL HIGH (ref 65–99)
Potassium: 4.3 mmol/L (ref 3.5–5.3)
Sodium: 138 mmol/L (ref 135–146)
Total Bilirubin: 0.6 mg/dL (ref 0.2–1.2)
Total Protein: 6.5 g/dL (ref 6.1–8.1)
eGFR: 46 mL/min/{1.73_m2} — ABNORMAL LOW

## 2022-03-30 LAB — LIPID PANEL
Cholesterol: 163 mg/dL
HDL: 57 mg/dL
LDL Cholesterol (Calc): 84 mg/dL
Non-HDL Cholesterol (Calc): 106 mg/dL
Total CHOL/HDL Ratio: 2.9 (calc)
Triglycerides: 120 mg/dL

## 2022-03-30 LAB — IRON,TIBC AND FERRITIN PANEL
%SAT: 31 % (ref 16–45)
Ferritin: 66 ng/mL (ref 16–288)
Iron: 112 ug/dL (ref 45–160)
TIBC: 356 ug/dL (ref 250–450)

## 2022-03-30 LAB — HEMOGLOBIN A1C
Hgb A1c MFr Bld: 6 %{Hb} — ABNORMAL HIGH
Mean Plasma Glucose: 126 mg/dL
eAG (mmol/L): 7 mmol/L

## 2022-03-30 LAB — TSH: TSH: 2.27 m[IU]/L (ref 0.40–4.50)

## 2022-04-11 ENCOUNTER — Ambulatory Visit (INDEPENDENT_AMBULATORY_CARE_PROVIDER_SITE_OTHER): Payer: PPO

## 2022-04-11 DIAGNOSIS — E538 Deficiency of other specified B group vitamins: Secondary | ICD-10-CM | POA: Diagnosis not present

## 2022-04-11 MED ORDER — CYANOCOBALAMIN 1000 MCG/ML IJ SOLN
1000.0000 ug | Freq: Once | INTRAMUSCULAR | Status: AC
  Administered 2022-04-11: 1000 ug via INTRAMUSCULAR

## 2022-04-27 DIAGNOSIS — Z1231 Encounter for screening mammogram for malignant neoplasm of breast: Secondary | ICD-10-CM | POA: Diagnosis not present

## 2022-04-27 LAB — HM MAMMOGRAPHY

## 2022-05-02 DIAGNOSIS — M26601 Right temporomandibular joint disorder, unspecified: Secondary | ICD-10-CM | POA: Diagnosis not present

## 2022-05-02 DIAGNOSIS — D110 Benign neoplasm of parotid gland: Secondary | ICD-10-CM | POA: Diagnosis not present

## 2022-05-17 ENCOUNTER — Ambulatory Visit (INDEPENDENT_AMBULATORY_CARE_PROVIDER_SITE_OTHER): Payer: PPO

## 2022-05-17 DIAGNOSIS — E538 Deficiency of other specified B group vitamins: Secondary | ICD-10-CM

## 2022-05-17 MED ORDER — CYANOCOBALAMIN 1000 MCG/ML IJ SOLN
1000.0000 ug | Freq: Once | INTRAMUSCULAR | Status: AC
  Administered 2022-05-17: 1000 ug via INTRAMUSCULAR

## 2022-05-19 DIAGNOSIS — M810 Age-related osteoporosis without current pathological fracture: Secondary | ICD-10-CM | POA: Diagnosis not present

## 2022-06-14 ENCOUNTER — Ambulatory Visit (INDEPENDENT_AMBULATORY_CARE_PROVIDER_SITE_OTHER): Payer: PPO

## 2022-06-14 ENCOUNTER — Ambulatory Visit: Payer: PPO

## 2022-06-14 DIAGNOSIS — E538 Deficiency of other specified B group vitamins: Secondary | ICD-10-CM

## 2022-06-14 MED ORDER — CYANOCOBALAMIN 1000 MCG/ML IJ SOLN
1000.0000 ug | Freq: Once | INTRAMUSCULAR | Status: AC
  Administered 2022-06-14: 1000 ug via INTRAMUSCULAR

## 2022-07-12 ENCOUNTER — Ambulatory Visit: Payer: PPO

## 2022-07-26 DIAGNOSIS — M810 Age-related osteoporosis without current pathological fracture: Secondary | ICD-10-CM | POA: Diagnosis not present

## 2022-07-27 ENCOUNTER — Ambulatory Visit (INDEPENDENT_AMBULATORY_CARE_PROVIDER_SITE_OTHER): Payer: PPO

## 2022-07-27 DIAGNOSIS — E538 Deficiency of other specified B group vitamins: Secondary | ICD-10-CM

## 2022-07-27 MED ORDER — CYANOCOBALAMIN 1000 MCG/ML IJ SOLN
1000.0000 ug | Freq: Once | INTRAMUSCULAR | Status: AC
  Administered 2022-07-27: 1000 ug via INTRAMUSCULAR

## 2022-07-29 DIAGNOSIS — Z87898 Personal history of other specified conditions: Secondary | ICD-10-CM | POA: Diagnosis not present

## 2022-07-29 DIAGNOSIS — R9082 White matter disease, unspecified: Secondary | ICD-10-CM | POA: Diagnosis not present

## 2022-07-29 DIAGNOSIS — F458 Other somatoform disorders: Secondary | ICD-10-CM | POA: Diagnosis not present

## 2022-07-29 DIAGNOSIS — R519 Headache, unspecified: Secondary | ICD-10-CM | POA: Diagnosis not present

## 2022-07-29 DIAGNOSIS — G44221 Chronic tension-type headache, intractable: Secondary | ICD-10-CM | POA: Diagnosis not present

## 2022-08-11 ENCOUNTER — Other Ambulatory Visit: Payer: Self-pay | Admitting: Student

## 2022-08-11 DIAGNOSIS — G44221 Chronic tension-type headache, intractable: Secondary | ICD-10-CM

## 2022-08-17 ENCOUNTER — Other Ambulatory Visit: Payer: Self-pay | Admitting: Family Medicine

## 2022-08-17 DIAGNOSIS — K219 Gastro-esophageal reflux disease without esophagitis: Secondary | ICD-10-CM

## 2022-08-17 DIAGNOSIS — E785 Hyperlipidemia, unspecified: Secondary | ICD-10-CM

## 2022-08-17 DIAGNOSIS — F411 Generalized anxiety disorder: Secondary | ICD-10-CM

## 2022-08-24 ENCOUNTER — Ambulatory Visit (INDEPENDENT_AMBULATORY_CARE_PROVIDER_SITE_OTHER): Payer: PPO

## 2022-08-24 DIAGNOSIS — E538 Deficiency of other specified B group vitamins: Secondary | ICD-10-CM

## 2022-08-24 MED ORDER — CYANOCOBALAMIN 1000 MCG/ML IJ SOLN
1000.0000 ug | Freq: Once | INTRAMUSCULAR | Status: AC
  Administered 2022-08-24: 1000 ug via INTRAMUSCULAR

## 2022-08-26 ENCOUNTER — Ambulatory Visit
Admission: RE | Admit: 2022-08-26 | Discharge: 2022-08-26 | Disposition: A | Discharge status: Home / Self Care | Payer: PPO | Source: Ambulatory Visit | Attending: Student | Admitting: Student

## 2022-08-26 DIAGNOSIS — G44221 Chronic tension-type headache, intractable: Secondary | ICD-10-CM

## 2022-08-26 DIAGNOSIS — R519 Headache, unspecified: Secondary | ICD-10-CM | POA: Diagnosis not present

## 2022-09-05 DIAGNOSIS — Z87898 Personal history of other specified conditions: Secondary | ICD-10-CM | POA: Diagnosis not present

## 2022-09-05 DIAGNOSIS — G44221 Chronic tension-type headache, intractable: Secondary | ICD-10-CM | POA: Diagnosis not present

## 2022-09-05 DIAGNOSIS — R9082 White matter disease, unspecified: Secondary | ICD-10-CM | POA: Diagnosis not present

## 2022-09-05 DIAGNOSIS — F458 Other somatoform disorders: Secondary | ICD-10-CM | POA: Diagnosis not present

## 2022-09-06 NOTE — Progress Notes (Unsigned)
Name: Dawn Fuentes   MRN: [id number]    DOB: 15-Apr-1946   Date:09/07/2022       Progress Note  Subjective  Chief Complaint  Follow Up  HPI  GAD: she was taking Citalopram 20 mg but a few weeks ago medication was switched from Citalopram to Duloxetine 20 mg , no withdrawn symptoms,  she takes alprazolam seldom, at most once a week, no side effects. We will adjust dose to 60 mg daily. She takes alprazolam prn for panic attacks   Insomnia: she takes Ambien 5 mg every night it helps her fall and stay asleep. She has difficulty sleeping without medicaiton    Hypothyroidism: she is taking medication as prescribed, no hair loss, palpitation or change in bowel movements . She is taking two pills M, W and Fridays and one daily other days of the week.TSH has been at goal    DDD cervical spine with radiculitis, seen by Dr. Trenton Gammon had PT at [place] in the past  and initially had improvement of frequency of headaches, she still has radiculitis going down to upper arm but only when rotating to right side. She was seeing  Dr. Holley Raring had steroid injection done in 12/2020 , it relieved the headache for one day but helped with arm radiculitis. She also had a nerve block 01/2021 and it only helped with headache for one day . She was referred by Dr. Holley Raring to Dr. Richardson Landry due to a .parotid mass found on CT , she had the parotid adenoma removed May 2022 but still has some scar tissue under the incision. She was seen by Dr. Manuella Ghazi and tried medications that did not work, he advised her to see Dr. Oswaldo Milian for acupuncture and headaches improved temporarily but too expensive. She states he tried her on Topamax but had to stop because of side effects and also developed paresthesia on left hand ( she is right hand dominant) no weakness normal NCS and MRI brain. . Currently on propanolol , celexa switched to duloxetine and taking baclofen prn, still has headaches daily on right tempol and sometimes radiates to frontal area of down  her ear. Taking Tylenol 2 daily   MRI brain 01/23  IMPRESSION:  No evidence of acute intracranial abnormality.  Mild chronic small vessel ischemic changes within the cerebral white  matter.  Otherwise unremarkable non-contrast MRI appearance of the brain for  age.   HTN/CKI: stage III, normal urine output, no pruritis, bp has been at goal, no chest pain, palpitation or SOB. Currently on ARB, good bp at home.   Dyslipidemia: taking simvastatin, last LDL was at goal down from 102 to 69  but up again to 84 Spring 2023   Hyperglycemia: A1C was up from 5.5 % to 6.2 % and last visit down to 6 %  , discussed life style modification  Patient Active Problem List   Diagnosis Date Noted   Lichen simplex 54/65/6812   Chronic nonintractable headache 07/06/2021   Cervicogenic migraine 01/14/2021   Cervical facet joint syndrome 01/14/2021   Cervical radiculitis 12/01/2020   Bilateral hip pain 12/01/2020   Chronic bilateral low back pain without sciatica 12/01/2020   Chronic SI joint pain 12/01/2020   Chronic pain syndrome 12/01/2020   Body mass index (BMI) 30.0-30.9, adult 11/18/2020   Essential (primary) hypertension 11/18/2020   Cervical radicular pain 11/18/2020   Degeneration of cervical intervertebral disc 75/17/0017   Lichen sclerosus et atrophicus of the vulva 11/04/2019   [name] deficiency 12/26/2018  Osteoarthritis of knees, bilateral 05/26/2017   Primary open angle glaucoma of both eyes 11/21/2016   Chronic kidney disease (CKD), stage III (moderate) ([place]) 04/06/2016   Cystitis 10/12/2015   Benign hypertension 08/21/2015   Insomnia, persistent 08/21/2015   Dyslipidemia 08/21/2015   Gastro-esophageal reflux disease without esophagitis 08/21/2015   Glaucoma 08/21/2015   Blood glucose elevated 08/21/2015   Adult hypothyroidism 08/21/2015   Climacteric 08/21/2015   Senile osteoporosis 08/21/2015   Seasonal allergies 08/21/2015   Female genuine stress incontinence 08/21/2015    Vitamin D deficiency 08/21/2015    Past Surgical History:  Procedure Laterality Date   ABDOMINAL HYSTERECTOMY     21 years ago   APPENDECTOMY     COLONOSCOPY N/A 09/14/2020   Procedure: COLONOSCOPY;  Surgeon: Lesly Rubenstein, MD;  Location: [facility] ENDOSCOPY;  Service: Endoscopy;  Laterality: N/A;   COLONOSCOPY WITH PROPOFOL N/A 05/29/2015   Procedure: COLONOSCOPY WITH PROPOFOL;  Surgeon: Hulen Luster, MD;  Location: [facility] ENDOSCOPY;  Service: Gastroenterology;  Laterality: N/A;   ESOPHAGOGASTRODUODENOSCOPY N/A 05/29/2015   Procedure: ESOPHAGOGASTRODUODENOSCOPY (EGD);  Surgeon: Hulen Luster, MD;  Location: [facility] ENDOSCOPY;  Service: Gastroenterology;  Laterality: N/A;   ESOPHAGOGASTRODUODENOSCOPY N/A 09/14/2020   Procedure: ESOPHAGOGASTRODUODENOSCOPY (EGD);  Surgeon: Lesly Rubenstein, MD;  Location: [facility] ENDOSCOPY;  Service: Endoscopy;  Laterality: N/A;   EYE SURGERY     eye lids   OVARIAN CYST REMOVAL     needed transfusion   PAROTIDECTOMY Right 05/05/2021   Procedure: SUPERFICIAL PAROTIDECTOMY;  Surgeon: Clyde Canterbury, MD;  Location: [facility] ORS;  Service: ENT;  Laterality: Right;   PHOTOCOAGULATION WITH LASER Right 11/21/2016   Procedure: PHOTOCOAGULATION WITH LASER;  Surgeon: Ronnell Freshwater, MD;  Location: [place];  Service: Ophthalmology;  Laterality: Right;  A micropulse probe was applied to each hemilimbus with the following settings: 2036m, 31.3% duty cycle, 90 seconds x 3 applications.    TONSILLECTOMY      Family History  Problem Relation Age of Onset   Hypertension Mother    Osteoporosis Mother    COPD Mother    Dementia Mother    Cancer Father        Colon   Hypothyroidism Daughter    Aneurysm Brother    COPD Sister     Social History   Tobacco Use   Smoking status: Never   Smokeless tobacco: Never   Tobacco comments:    smoking cessation materials not required  Substance Use Topics   Alcohol use: No    Alcohol/week: 0.0 standard drinks of  alcohol     Current Outpatient Medications:    ALPRAZolam (XANAX) 0.25 MG tablet, Take 1 tablet (0.25 mg total) by mouth daily as needed for anxiety. Rarely, Disp: 30 tablet, Rfl: 0   baclofen (LIORESAL) 10 MG tablet, Take 10 mg by mouth 3 (three) times daily., Disp: , Rfl:    Calcium Carb-Cholecalciferol 600-800 MG-UNIT TABS, Take 1 tablet by mouth daily., Disp: , Rfl:    clobetasol ointment (TEMOVATE) 04.19%, Apply 1 application. topically 2 (two) times a week., Disp: 30 g, Rfl: 2   Cyanocobalamin (VITAMIN B-12 IJ), Inject as directed every 30 (thirty) days., Disp: , Rfl:    denosumab (PROLIA) 60 MG/ML SOSY injection, Inject 60 mg into the skin every 6 (six) months., Disp: , Rfl:    DULoxetine (CYMBALTA) 60 MG capsule, Take 1 capsule (60 mg total) by mouth daily., Disp: 90 capsule, Rfl: 1   levocetirizine (XYZAL) 5 MG tablet, Take 1  tablet (5 mg total) by mouth every evening., Disp: 90 tablet, Rfl: 1   Omega-3 Fatty Acids (FISH OIL) 1000 MG CAPS, Take 1,000 mg by mouth daily., Disp: , Rfl:    propranolol ER (INDERAL LA) 60 MG 24 hr capsule, Take 60 mg by mouth daily., Disp: , Rfl:    timolol (TIMOPTIC) 0.5 % ophthalmic solution, Place 1 drop into both eyes 2 (two) times daily., Disp: , Rfl: 5   Turmeric 500 MG TABS, Take 500 mg by mouth in the morning and at bedtime., Disp: , Rfl:    VYZULTA 0.024 % SOLN, Place 1 drop into both eyes at bedtime., Disp: , Rfl: 2   levothyroxine (SYNTHROID) 50 MCG tablet, TAKE 2 TABLETS BY MOUTH DAILY BEFORE BREAKFAST ON MONDAY, WEDNESDAY, FRIDAY, AND 1 TABLET ON ALL OTHER DAYS OF THE WEEK Strength: 50 mcg, Disp: 128 tablet, Rfl: 1   losartan-hydrochlorothiazide (HYZAAR) 100-12.5 MG tablet, Take 1 tablet by mouth daily., Disp: 90 tablet, Rfl: 1   nitrofurantoin, macrocrystal-monohydrate, (MACROBID) 100 MG capsule, Take 1 capsule (100 mg total) by mouth 2 (two) times daily., Disp: 10 capsule, Rfl: 1   pantoprazole (PROTONIX) 40 MG tablet, Take 1 tablet (40 mg  total) by mouth daily., Disp: 90 tablet, Rfl: 1   simvastatin (ZOCOR) 40 MG tablet, Take 1 tablet (40 mg total) by mouth every evening., Disp: 90 tablet, Rfl: 1   zolpidem (AMBIEN) 5 MG tablet, Take 1 tablet (5 mg total) by mouth at bedtime., Disp: 90 tablet, Rfl: 1  Current Facility-Administered Medications:    cyanocobalamin ((VITAMIN B-12)) injection 1,000 mcg, 1,000 mcg, Intramuscular, Q30 days, Katrena Stehlin, MD, 1,000 mcg at 03/08/22 1426  Allergies  Allergen Reactions   Brimonidine Tartrate Other (See Comments)    Burning in eyes   Latanoprost Other (See Comments)    burning   Meloxicam     Increased blood pressure    Raloxifene     bone pain    I personally reviewed active problem list, medication list, allergies, family history, social history, health maintenance with the patient/caregiver today.   ROS  Constitutional: Negative for fever or weight change.  Respiratory: Negative for cough and shortness of breath.   Cardiovascular: Negative for chest pain or palpitations.  Gastrointestinal: Negative for abdominal pain, no bowel changes.  Musculoskeletal: Negative for gait problem or joint swelling.  Skin: Negative for rash.  Neurological: Negative for dizziness or headache.  No other specific complaints in a complete review of systems (except as listed in HPI above).   Objective  Vitals:   09/07/22 1324  BP: 126/70  Pulse: 66  Resp: 16  Temp: 97.7 F (36.5 C)  TempSrc: Oral  SpO2: 94%  Weight: 181 lb 8 oz (82.3 kg)  Height: '5\' 5"'$  (1.651 m)    Body mass index is 30.2 kg/m.  Physical Exam  Constitutional: Patient appears well-developed and well-nourished. Obese  No distress.  HEENT: head atraumatic, normocephalic, pupils equal and reactive to light, neck supple Cardiovascular: Normal rate, regular rhythm and normal heart sounds.  No murmur heard. No BLE edema. Pulmonary/Chest: Effort normal and breath sounds normal. No respiratory distress. Abdominal:  Soft.  There is no tenderness. Psychiatric: Patient has a normal mood and affect. behavior is normal. Judgment and thought content normal.   PHQ2/9:    09/07/2022    1:16 PM 03/08/2022    1:40 PM 09/16/2021    9:34 AM 09/10/2021    2:00 PM 07/06/2021    1:46 PM  Depression screen PHQ 2/9  Decreased Interest 0 0 0 0 0  Down, Depressed, Hopeless 0 0 0 0 0  PHQ - 2 Score 0 0 0 0 0  Altered sleeping 0 0  1   Tired, decreased energy 0 0  0   Change in appetite 0 0  0   Feeling bad or failure about yourself  0 0  0   Trouble concentrating 0 0  0   Moving slowly or fidgety/restless 0 0  0   Suicidal thoughts 0 0  0   PHQ-9 Score 0 0  1   Difficult doing work/chores Not difficult at all   Not difficult at all     phq 9 is negative   Fall Risk:    09/07/2022    1:16 PM 03/08/2022    1:40 PM 11/16/2021   11:16 AM 09/16/2021    9:37 AM 09/10/2021    1:56 PM  Fall Risk   Falls in the past year? 0 0 0 0 0  Number falls in past yr: 0 0  0   Injury with Fall? 0 0  0   Risk for fall due to : No Fall Risks No Fall Risks  No Fall Risks   Follow up Falls prevention discussed;Education provided Falls prevention discussed  Falls prevention discussed Falls prevention discussed     Assessment & Plan  1. Adult hypothyroidism  - levothyroxine (SYNTHROID) 50 MCG tablet; TAKE 2 TABLETS BY MOUTH DAILY BEFORE BREAKFAST ON MONDAY, WEDNESDAY, FRIDAY, AND 1 TABLET ON ALL OTHER DAYS OF THE WEEK Strength: 50 mcg  Dispense: 128 tablet; Refill: 1  2. Stage 3a chronic kidney disease (HCC)  Stable, she does not take nsaid's  3. Vitamin D deficiency   4. GAD (generalized anxiety disorder)  - DULoxetine (CYMBALTA) 60 MG capsule; Take 1 capsule (60 mg total) by mouth daily.  Dispense: 90 capsule; Refill: 1  5. DDD (degenerative disc disease), cervical   6. Benign hypertension  - losartan-hydrochlorothiazide (HYZAAR) 100-12.5 MG tablet; Take 1 tablet by mouth daily.  Dispense: 90 tablet; Refill:  1  7. Gastro-esophageal reflux disease without esophagitis  - pantoprazole (PROTONIX) 40 MG tablet; Take 1 tablet (40 mg total) by mouth daily.  Dispense: 90 tablet; Refill: 1  8. Insomnia, persistent  - zolpidem (AMBIEN) 5 MG tablet; Take 1 tablet (5 mg total) by mouth at bedtime.  Dispense: 90 tablet; Refill: 1  9. Need for influenza vaccination  - Flu Vaccine QUAD High Dose(Fluad)  10. Vitamin [name] deficiency   11. Dyslipidemia  - simvastatin (ZOCOR) 40 MG tablet; Take 1 tablet (40 mg total) by mouth every evening.  Dispense: 90 tablet; Refill: 1  12. Hyperglycemia   13. Nonintractable episodic headache, unspecified headache type  - propranolol ER (INDERAL LA) 60 MG 24 hr capsule; Take 60 mg by mouth daily. - baclofen (LIORESAL) 10 MG tablet; Take 10 mg by mouth 3 (three) times daily.  14. Recurrent UTI  - nitrofurantoin, macrocrystal-monohydrate, (MACROBID) 100 MG capsule; Take 1 capsule (100 mg total) by mouth 2 (two) times daily.  Dispense: 10 capsule; Refill: 1

## 2022-09-07 ENCOUNTER — Encounter: Payer: Self-pay | Admitting: Family Medicine

## 2022-09-07 ENCOUNTER — Ambulatory Visit (INDEPENDENT_AMBULATORY_CARE_PROVIDER_SITE_OTHER): Payer: PPO | Admitting: Family Medicine

## 2022-09-07 VITALS — BP 126/70 | HR 66 | Temp 97.7°F | Resp 16 | Ht 65.0 in | Wt 181.5 lb

## 2022-09-07 DIAGNOSIS — E538 Deficiency of other specified B group vitamins: Secondary | ICD-10-CM

## 2022-09-07 DIAGNOSIS — F411 Generalized anxiety disorder: Secondary | ICD-10-CM | POA: Diagnosis not present

## 2022-09-07 DIAGNOSIS — N390 Urinary tract infection, site not specified: Secondary | ICD-10-CM

## 2022-09-07 DIAGNOSIS — G4700 Insomnia, unspecified: Secondary | ICD-10-CM | POA: Diagnosis not present

## 2022-09-07 DIAGNOSIS — N1831 Chronic kidney disease, stage 3a: Secondary | ICD-10-CM | POA: Diagnosis not present

## 2022-09-07 DIAGNOSIS — M5030 Other cervical disc degeneration, unspecified cervical region: Secondary | ICD-10-CM | POA: Diagnosis not present

## 2022-09-07 DIAGNOSIS — R739 Hyperglycemia, unspecified: Secondary | ICD-10-CM

## 2022-09-07 DIAGNOSIS — K219 Gastro-esophageal reflux disease without esophagitis: Secondary | ICD-10-CM

## 2022-09-07 DIAGNOSIS — E039 Hypothyroidism, unspecified: Secondary | ICD-10-CM | POA: Diagnosis not present

## 2022-09-07 DIAGNOSIS — E559 Vitamin D deficiency, unspecified: Secondary | ICD-10-CM

## 2022-09-07 DIAGNOSIS — E785 Hyperlipidemia, unspecified: Secondary | ICD-10-CM

## 2022-09-07 DIAGNOSIS — Z23 Encounter for immunization: Secondary | ICD-10-CM

## 2022-09-07 DIAGNOSIS — R519 Headache, unspecified: Secondary | ICD-10-CM

## 2022-09-07 DIAGNOSIS — I10 Essential (primary) hypertension: Secondary | ICD-10-CM

## 2022-09-07 MED ORDER — LEVOTHYROXINE SODIUM 50 MCG PO TABS: ORAL_TABLET | ORAL | 1 refills | Status: DC

## 2022-09-07 MED ORDER — NITROFURANTOIN MONOHYD MACRO 100 MG PO CAPS: 100.0000 mg | ORAL_CAPSULE | Freq: Two times a day (BID) | ORAL | 1 refills | Status: DC

## 2022-09-07 MED ORDER — DULOXETINE HCL 60 MG PO CPEP: 60.0000 mg | ORAL_CAPSULE | Freq: Every day | ORAL | 1 refills | Status: DC

## 2022-09-07 MED ORDER — PANTOPRAZOLE SODIUM 40 MG PO TBEC: 40.0000 mg | DELAYED_RELEASE_TABLET | Freq: Every day | ORAL | 1 refills | Status: DC

## 2022-09-07 MED ORDER — LOSARTAN POTASSIUM-HCTZ 100-12.5 MG PO TABS: 1.0000 | ORAL_TABLET | Freq: Every day | ORAL | 1 refills | Status: DC

## 2022-09-07 MED ORDER — ZOLPIDEM TARTRATE 5 MG PO TABS: 5.0000 mg | ORAL_TABLET | Freq: Every day | ORAL | 1 refills | Status: DC

## 2022-09-07 MED ORDER — SIMVASTATIN 40 MG PO TABS: 40.0000 mg | ORAL_TABLET | Freq: Every evening | ORAL | 1 refills | Status: DC

## 2022-09-07 NOTE — Patient Instructions (Signed)
Trigeminal Neuralgia  Trigeminal neuralgia is a nerve disorder that causes severe pain on one side of the face. The pain may last from a few seconds to several minutes, but it can happen hundreds of times a day. The pain is usually only on one side of the face. Symptoms may occur for days, weeks, or months and then go away for months or years. The pain may return and be worse than before. What are the causes? This condition may be caused by: Damage or pressure to a nerve in the head that is called the trigeminal nerve. An attack can be triggered by: Talking or chewing. Putting on makeup. Washing, shaving, or touching your face. Brushing your teeth. Blasts of hot or cold air. Primary demyelinating disorders, such as multiple sclerosis. Tumors. What increases the risk? You are more likely to develop this condition if: You are 50-60 years old. You are female. What are the signs or symptoms? The main symptom of this condition is severe pain in the jaw, lips, eyes, nose, scalp, forehead, and face. How is this diagnosed? This condition is diagnosed with a physical exam. A CT scan or an MRI may be done to rule out other conditions that can cause facial pain. How is this treated? This condition may be treated with: Measures to avoid the things that trigger your symptoms. Prescription medicines such as anticonvulsants. Procedures such as ablation, thermal, or radiation therapy. Cognitive or behavioral therapy. Complementary therapies such as: Gentle, regular exercise or yoga. Meditation. Aromatherapy. Acupuncture. Surgery. This may be done in severe cases if other medical treatment does not provide relief. It may take up to one month for treatment to start relieving the pain. Follow these instructions at home: Managing pain  Learn as much as you can about how to manage your pain. Ask your health care provider if a pain specialist would be helpful. Consider talking with a mental health  care provider about how to cope with the pain. Consider joining a pain support group. General instructions Take over-the-counter and prescription medicines only as told by your health care provider. Avoid the things that trigger your symptoms. It may help to: Chew on the unaffected side of your mouth. Avoid touching your face. Avoid blasts of hot or cold air. Keep all follow-up visits. Where to find more information Facial Pain Association: facepain.org Contact a health care provider if: Your medicine is not helping your symptoms. You have side effects from the medicine used for treatment. You develop new, unexplained symptoms, such as: Double vision. Facial weakness or numbness. Changes in hearing or balance. You feel depressed. Get help right away if: Your pain is severe and is not getting better. You develop suicidal thoughts. If you ever feel like you may hurt yourself or others, or have thoughts about taking your own life, get help right away. Go to your nearest emergency department or: Call your local emergency services (911 in the [place] Call a suicide crisis helpline, such as the National Suicide Prevention Lifeline at 1-800-273-8255 or 988 in the [place] This is open 24 hours a day in the [place] Text the Crisis Text Line at 741741 (in the [place] Summary Trigeminal neuralgia is a nerve disorder that causes severe pain on one side of the face. The pain may last from a few seconds to several minutes. This condition is caused by damage or pressure to a nerve in the head that is called the trigeminal nerve. Treatment may include avoiding the things that trigger your symptoms,   taking medicines, or having procedures or surgery. It may take up to one month for treatment to start relieving the pain. Keep all follow-up visits. This information is not intended to replace advice given to you by your health care provider. Make sure you discuss any questions you have with your health care  provider. Document Revised: 07/01/2021 Document Reviewed: 05/31/2021 Elsevier Patient Education  2023 [employer]  

## 2022-09-15 DIAGNOSIS — H401132 Primary open-angle glaucoma, bilateral, moderate stage: Secondary | ICD-10-CM | POA: Diagnosis not present

## 2022-10-06 ENCOUNTER — Ambulatory Visit (INDEPENDENT_AMBULATORY_CARE_PROVIDER_SITE_OTHER): Payer: PPO

## 2022-10-06 DIAGNOSIS — E538 Deficiency of other specified B group vitamins: Secondary | ICD-10-CM | POA: Diagnosis not present

## 2022-10-06 MED ORDER — CYANOCOBALAMIN 1000 MCG/ML IJ SOLN
1000.0000 ug | Freq: Once | INTRAMUSCULAR | Status: AC
  Administered 2022-10-06: 1000 ug via INTRAMUSCULAR

## 2022-10-11 NOTE — Progress Notes (Unsigned)
Name: Dawn Fuentes   MRN: [id number]    DOB: Jan 02, 1946   Date:10/12/2022       Progress Note  Subjective  Chief Complaint  Medication Follow Up  HPI  GAD: she was doing well on Citalopram, however neurologist switched her from citalopram to duloxetine 20 mg back in September and I adjusted dose on her last visit a couple of weeks ago, she has been feeling more anxious since. We will resume citalopram and stop duloxetine, discussed how to transition doses with patient she has also noticed night sweats and some nausea with change of medication  Hypothyroidism: we will recheck labs due to increase in anxiety and night sweats  CKI: avoiding nsaid;s denies pruritis or decrease in urinary output, we will recheck levels today   Headaches: under the care of neurologist, she is now taking propanolol 60 mg and states headache frequency went from 5 times per day to at most twice per day and goes some days with no headaches. She is very pleased but noticed drop in bp and is now only taking half dose of losartan hctz. BP in the 120's/70's since she decrease dose of losartan   Patient Active Problem List   Diagnosis Date Noted   Lichen simplex 58/85/0277   Chronic nonintractable headache 07/06/2021   Cervicogenic migraine 01/14/2021   Cervical facet joint syndrome 01/14/2021   Cervical radiculitis 12/01/2020   Bilateral hip pain 12/01/2020   Chronic bilateral low back pain without sciatica 12/01/2020   Chronic SI joint pain 12/01/2020   Chronic pain syndrome 12/01/2020   Body mass index (BMI) 30.0-30.9, adult 11/18/2020   Essential (primary) hypertension 11/18/2020   Cervical radicular pain 11/18/2020   Degeneration of cervical intervertebral disc 41/28/7867   Lichen sclerosus et atrophicus of the vulva 11/04/2019   [name] deficiency 12/26/2018   Osteoarthritis of knees, bilateral 05/26/2017   Primary open angle glaucoma of both eyes 11/21/2016   Chronic kidney disease (CKD), stage III  (moderate) ([place]) 04/06/2016   Cystitis 10/12/2015   Benign hypertension 08/21/2015   Insomnia, persistent 08/21/2015   Dyslipidemia 08/21/2015   Gastro-esophageal reflux disease without esophagitis 08/21/2015   Glaucoma 08/21/2015   Blood glucose elevated 08/21/2015   Adult hypothyroidism 08/21/2015   Climacteric 08/21/2015   Senile osteoporosis 08/21/2015   Seasonal allergies 08/21/2015   Female genuine stress incontinence 08/21/2015   Vitamin D deficiency 08/21/2015    Past Surgical History:  Procedure Laterality Date   ABDOMINAL HYSTERECTOMY     21 years ago   APPENDECTOMY     COLONOSCOPY N/A 09/14/2020   Procedure: COLONOSCOPY;  Surgeon: Lesly Rubenstein, MD;  Location: [facility] ENDOSCOPY;  Service: Endoscopy;  Laterality: N/A;   COLONOSCOPY WITH PROPOFOL N/A 05/29/2015   Procedure: COLONOSCOPY WITH PROPOFOL;  Surgeon: Hulen Luster, MD;  Location: [facility] ENDOSCOPY;  Service: Gastroenterology;  Laterality: N/A;   ESOPHAGOGASTRODUODENOSCOPY N/A 05/29/2015   Procedure: ESOPHAGOGASTRODUODENOSCOPY (EGD);  Surgeon: Hulen Luster, MD;  Location[facility]OSCOPY;  Service: Gastroenterology;  Laterality: N/A;   ESOPHAGOGASTRODUODENOSCOPY N/A 09/14/2020   Procedure: ESOPHAGOGASTRODUODENOSCOPY (EGD);  Surgeon: Lesly Rubenstein, MD;  Location: [facility] ENDOSCOPY;  Service: Endoscopy;  Laterality: N/A;   EYE SURGERY     eye lids   OVARIAN CYST REMOVAL     needed transfusion   PAROTIDECTOMY Right 05/05/2021   Procedure: SUPERFICIAL PAROTIDECTOMY;  Surgeon: Clyde Canterbury, MD;  Location: [facility] ORS;  Service: ENT;  Laterality: Right;   PHOTOCOAGULATION WITH LASER Right 11/21/2016   Procedure: PHOTOCOAGULATION WITH LASER;  Surgeon: Ronnell Freshwater, MD;  Location: [place];  Service: Ophthalmology;  Laterality: Right;  A micropulse probe was applied to each hemilimbus with the following settings: 2077m, 31.3% duty cycle, 90 seconds x 3 applications.    TONSILLECTOMY      Family History   Problem Relation Age of Onset   Hypertension Mother    Osteoporosis Mother    COPD Mother    Dementia Mother    Cancer Father        Colon   Hypothyroidism Daughter    Aneurysm Brother    COPD Sister     Social History   Tobacco Use   Smoking status: Never   Smokeless tobacco: Never   Tobacco comments:    smoking cessation materials not required  Substance Use Topics   Alcohol use: No    Alcohol/week: 0.0 standard drinks of alcohol     Current Outpatient Medications:    ALPRAZolam (XANAX) 0.25 MG tablet, Take 1 tablet (0.25 mg total) by mouth daily as needed for anxiety. Rarely, Disp: 30 tablet, Rfl: 0   baclofen (LIORESAL) 10 MG tablet, Take 10 mg by mouth 3 (three) times daily., Disp: , Rfl:    Calcium Carb-Cholecalciferol 600-800 MG-UNIT TABS, Take 1 tablet by mouth daily., Disp: , Rfl:    citalopram (CELEXA) 20 MG tablet, Take 1 tablet (20 mg total) by mouth daily., Disp: 90 tablet, Rfl: 0   clobetasol ointment (TEMOVATE) 07.12%, Apply 1 application. topically 2 (two) times a week., Disp: 30 g, Rfl: 2   Cyanocobalamin (VITAMIN B-12 IJ), Inject as directed every 30 (thirty) days., Disp: , Rfl:    denosumab (PROLIA) 60 MG/ML SOSY injection, Inject 60 mg into the skin every 6 (six) months., Disp: , Rfl:    levocetirizine (XYZAL) 5 MG tablet, Take 1 tablet (5 mg total) by mouth every evening., Disp: 90 tablet, Rfl: 1   levothyroxine (SYNTHROID) 50 MCG tablet, TAKE 2 TABLETS BY MOUTH DAILY BEFORE BREAKFAST ON MONDAY, WEDNESDAY, FRIDAY, AND 1 TABLET ON ALL OTHER DAYS OF THE WEEK Strength: 50 mcg, Disp: 128 tablet, Rfl: 1   nitrofurantoin, macrocrystal-monohydrate, (MACROBID) 100 MG capsule, Take 1 capsule (100 mg total) by mouth 2 (two) times daily., Disp: 10 capsule, Rfl: 1   Omega-3 Fatty Acids (FISH OIL) 1000 MG CAPS, Take 1,000 mg by mouth daily., Disp: , Rfl:    pantoprazole (PROTONIX) 40 MG tablet, Take 1 tablet (40 mg total) by mouth daily., Disp: 90 tablet, Rfl: 1    propranolol ER (INDERAL LA) 60 MG 24 hr capsule, Take 60 mg by mouth daily., Disp: , Rfl:    simvastatin (ZOCOR) 40 MG tablet, Take 1 tablet (40 mg total) by mouth every evening., Disp: 90 tablet, Rfl: 1   timolol (TIMOPTIC) 0.5 % ophthalmic solution, Place 1 drop into both eyes 2 (two) times daily., Disp: , Rfl: 5   Turmeric 500 MG TABS, Take 500 mg by mouth in the morning and at bedtime., Disp: , Rfl:    VYZULTA 0.024 % SOLN, Place 1 drop into both eyes at bedtime., Disp: , Rfl: 2   zolpidem (AMBIEN) 5 MG tablet, Take 1 tablet (5 mg total) by mouth at bedtime., Disp: 90 tablet, Rfl: 1   losartan-hydrochlorothiazide (HYZAAR) 100-12.5 MG tablet, Take 0.5 tablets by mouth daily., Disp: 45 tablet, Rfl: 0  Current Facility-Administered Medications:    cyanocobalamin ((VITAMIN B-12)) injection 1,000 mcg, 1,000 mcg, Intramuscular, Q30 days, SSteele Sizer MD, 1,000 mcg at 03/08/22 1426  Allergies  Allergen Reactions   Brimonidine Tartrate Other (See Comments)    Burning in eyes   Latanoprost Other (See Comments)    burning   Meloxicam     Increased blood pressure    Raloxifene     bone pain    I personally reviewed active problem list, medication list, allergies, family history, social history, health maintenance with the patient/caregiver today.   ROS  Ten systems reviewed and is negative except as mentioned in HPI   Objective  Vitals:   10/12/22 1547  BP: 118/80  Pulse: 78  Resp: 16  Temp: 97.7 F (36.5 C)  TempSrc: Oral  SpO2: 94%  Weight: 185 lb 14.4 oz (84.3 kg)  Height: '5\' 5"'$  (1.651 m)    Body mass index is 30.94 kg/m.  Physical Exam  Constitutional: Patient appears well-developed and well-nourished. Obese  No distress.  HEENT: head atraumatic, normocephalic, pupils equal and reactive to light, neck supple Cardiovascular: Normal rate, regular rhythm and normal heart sounds.  No murmur heard. No BLE edema. Pulmonary/Chest: Effort normal and breath sounds  normal. No respiratory distress. Abdominal: Soft.  There is no tenderness. Psychiatric: Patient has a normal mood and affect. behavior is normal. Judgment and thought content normal.    PHQ2/9:    10/12/2022    3:52 PM 09/07/2022    1:16 PM 03/08/2022    1:40 PM 09/16/2021    9:34 AM 09/10/2021    2:00 PM  Depression screen PHQ 2/9  Decreased Interest 0 0 0 0 0  Down, Depressed, Hopeless 0 0 0 0 0  PHQ - 2 Score 0 0 0 0 0  Altered sleeping 1 0 0  1  Tired, decreased energy 1 0 0  0  Change in appetite 0 0 0  0  Feeling bad or failure about yourself  0 0 0  0  Trouble concentrating 0 0 0  0  Moving slowly or fidgety/restless 0 0 0  0  Suicidal thoughts 0 0 0  0  PHQ-9 Score 2 0 0  1  Difficult doing work/chores Not difficult at all Not difficult at all   Not difficult at all    phq 9 is negative   Fall Risk:    10/12/2022    3:50 PM 09/07/2022    1:16 PM 03/08/2022    1:40 PM 11/16/2021   11:16 AM 09/16/2021    9:37 AM  Fall Risk   Falls in the past year? 0 0 0 0 0  Number falls in past yr:  0 0  0  Injury with Fall?  0 0  0  Risk for fall due to : No Fall Risks No Fall Risks No Fall Risks  No Fall Risks  Follow up Falls prevention discussed;Education provided;Falls evaluation completed Falls prevention discussed;Education provided Falls prevention discussed  Falls prevention discussed    Assessment & Plan  1. Nonintractable episodic headache, unspecified headache type  Doing better on propanolol  2. Adult hypothyroidism  - TSH  3. Stage 3a chronic kidney disease (HCC)  - COMPLETE METABOLIC PANEL WITH GFR  4. Night sweats  - COMPLETE METABOLIC PANEL WITH GFR - CBC with Differential/Platelet  5. GAD (generalized anxiety disorder)  - citalopram (CELEXA) 20 MG tablet; Take 1 tablet (20 mg total) by mouth daily.  Dispense: 90 tablet; Refill: 0  6. Benign hypertension  - losartan-hydrochlorothiazide (HYZAAR) 100-12.5 MG tablet; Take 0.5 tablets by mouth  daily.  Dispense: 45 tablet; Refill: 0

## 2022-10-12 ENCOUNTER — Encounter: Payer: Self-pay | Admitting: Family Medicine

## 2022-10-12 ENCOUNTER — Ambulatory Visit (INDEPENDENT_AMBULATORY_CARE_PROVIDER_SITE_OTHER): Payer: PPO | Admitting: Family Medicine

## 2022-10-12 VITALS — BP 118/80 | HR 78 | Temp 97.7°F | Resp 16 | Ht 65.0 in | Wt 185.9 lb

## 2022-10-12 DIAGNOSIS — R61 Generalized hyperhidrosis: Secondary | ICD-10-CM | POA: Diagnosis not present

## 2022-10-12 DIAGNOSIS — E039 Hypothyroidism, unspecified: Secondary | ICD-10-CM | POA: Diagnosis not present

## 2022-10-12 DIAGNOSIS — I10 Essential (primary) hypertension: Secondary | ICD-10-CM

## 2022-10-12 DIAGNOSIS — R519 Headache, unspecified: Secondary | ICD-10-CM

## 2022-10-12 DIAGNOSIS — N1831 Chronic kidney disease, stage 3a: Secondary | ICD-10-CM

## 2022-10-12 DIAGNOSIS — F411 Generalized anxiety disorder: Secondary | ICD-10-CM

## 2022-10-12 MED ORDER — CITALOPRAM HYDROBROMIDE 20 MG PO TABS: 20.0000 mg | ORAL_TABLET | Freq: Every day | ORAL | 0 refills | Status: DC

## 2022-10-12 MED ORDER — LOSARTAN POTASSIUM-HCTZ 100-12.5 MG PO TABS: 0.5000 | ORAL_TABLET | Freq: Every day | ORAL | 0 refills | Status: DC

## 2022-10-13 LAB — COMPLETE METABOLIC PANEL WITHOUT GFR
AG Ratio: 1.8 (calc) (ref 1.0–2.5)
ALT: 38 U/L — ABNORMAL HIGH (ref 6–29)
AST: 39 U/L — ABNORMAL HIGH (ref 10–35)
Albumin: 4.2 g/dL (ref 3.6–5.1)
Alkaline phosphatase (APISO): 58 U/L (ref 37–153)
BUN/Creatinine Ratio: 11 (calc) (ref 6–22)
BUN: 14 mg/dL (ref 7–25)
CO2: 27 mmol/L (ref 20–32)
Calcium: 9.9 mg/dL (ref 8.6–10.4)
Chloride: 102 mmol/L (ref 98–110)
Creat: 1.26 mg/dL — ABNORMAL HIGH (ref 0.60–1.00)
Globulin: 2.4 g/dL (ref 1.9–3.7)
Glucose, Bld: 131 mg/dL — ABNORMAL HIGH (ref 65–99)
Potassium: 4.6 mmol/L (ref 3.5–5.3)
Sodium: 139 mmol/L (ref 135–146)
Total Bilirubin: 0.3 mg/dL (ref 0.2–1.2)
Total Protein: 6.6 g/dL (ref 6.1–8.1)
eGFR: 45 mL/min/{1.73_m2} — ABNORMAL LOW

## 2022-10-13 LAB — CBC WITH DIFFERENTIAL/PLATELET
Absolute Monocytes: 965 {cells}/uL — ABNORMAL HIGH (ref 200–950)
Basophils Absolute: 91 {cells}/uL (ref 0–200)
Basophils Relative: 1.2 %
Eosinophils Absolute: 372 {cells}/uL (ref 15–500)
Eosinophils Relative: 4.9 %
HCT: 39.6 % (ref 35.0–45.0)
Hemoglobin: 13.6 g/dL (ref 11.7–15.5)
Lymphs Abs: 1611 {cells}/uL (ref 850–3900)
MCH: 31.6 pg (ref 27.0–33.0)
MCHC: 34.3 g/dL (ref 32.0–36.0)
MCV: 91.9 fL (ref 80.0–100.0)
MPV: 11.1 fL (ref 7.5–12.5)
Monocytes Relative: 12.7 %
Neutro Abs: 4560 {cells}/uL (ref 1500–7800)
Neutrophils Relative %: 60 %
Platelets: 290 10*3/uL (ref 140–400)
RBC: 4.31 Million/uL (ref 3.80–5.10)
RDW: 11.7 % (ref 11.0–15.0)
Total Lymphocyte: 21.2 %
WBC: 7.6 10*3/uL (ref 3.8–10.8)

## 2022-10-13 LAB — TSH: TSH: 4.73 m[IU]/L — ABNORMAL HIGH (ref 0.40–4.50)

## 2022-10-28 DIAGNOSIS — I10 Essential (primary) hypertension: Secondary | ICD-10-CM | POA: Diagnosis not present

## 2022-10-28 DIAGNOSIS — Z87898 Personal history of other specified conditions: Secondary | ICD-10-CM | POA: Diagnosis not present

## 2022-10-28 DIAGNOSIS — M5481 Occipital neuralgia: Secondary | ICD-10-CM | POA: Diagnosis not present

## 2022-10-28 DIAGNOSIS — G44221 Chronic tension-type headache, intractable: Secondary | ICD-10-CM | POA: Diagnosis not present

## 2022-10-28 DIAGNOSIS — R9082 White matter disease, unspecified: Secondary | ICD-10-CM | POA: Diagnosis not present

## 2022-10-28 DIAGNOSIS — F458 Other somatoform disorders: Secondary | ICD-10-CM | POA: Diagnosis not present

## 2022-11-02 IMAGING — US US SOFT TISSUE HEAD/NECK
1 series · 14 of 21 positions shown · non-contrast
Comparison: 01/06/2022

CLINICAL DATA: Right-sided neck fullness

EXAM:
ULTRASOUND OF HEAD/NECK SOFT TISSUES
TECHNIQUE: Ultrasound examination of the head and neck soft tissues was
performed in the area of clinical concern.

[Series 1: us soft tissue head & neck (non-thyroid) · 14 of 21 slices shown]
[im 1/21]
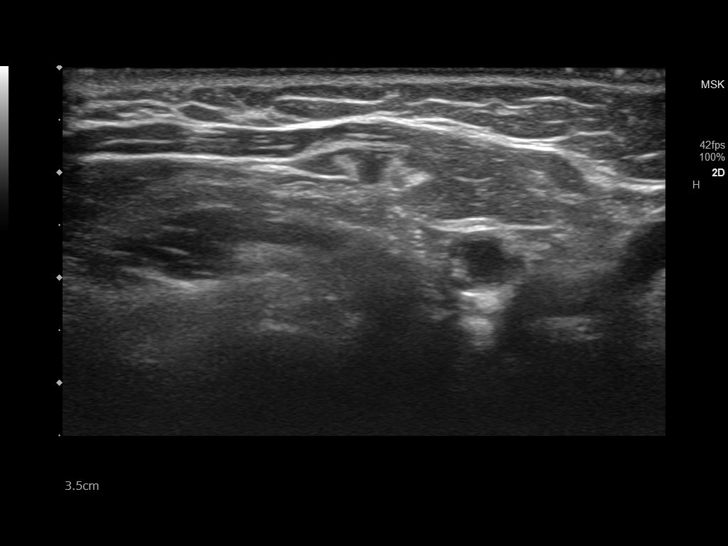
[im 3/21]
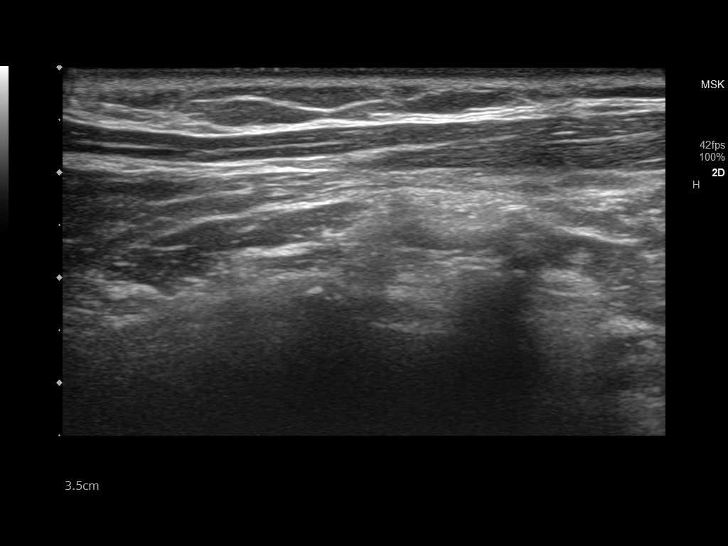
[im 4/21]
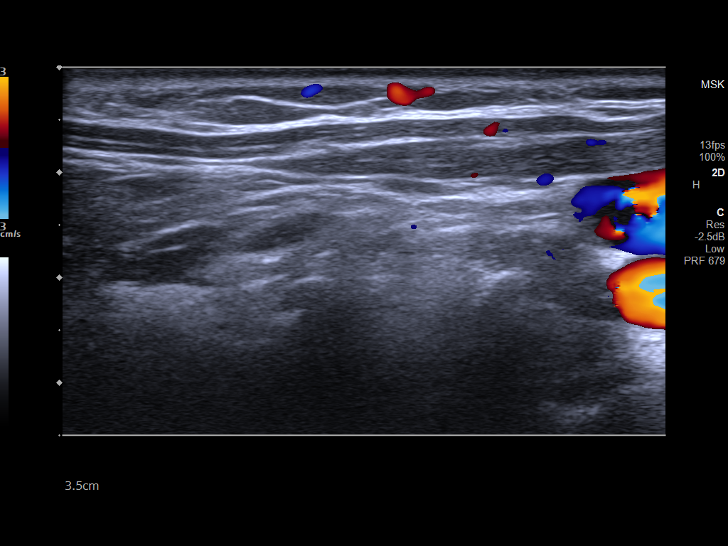
[im 6/21]
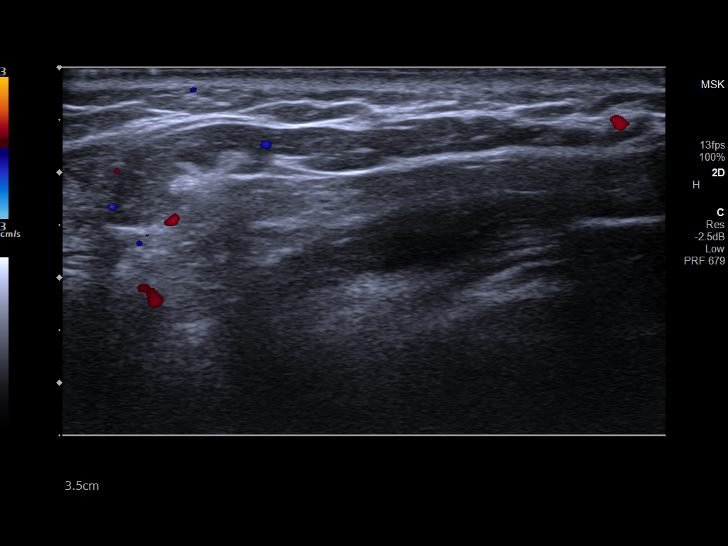
[im 7/21]
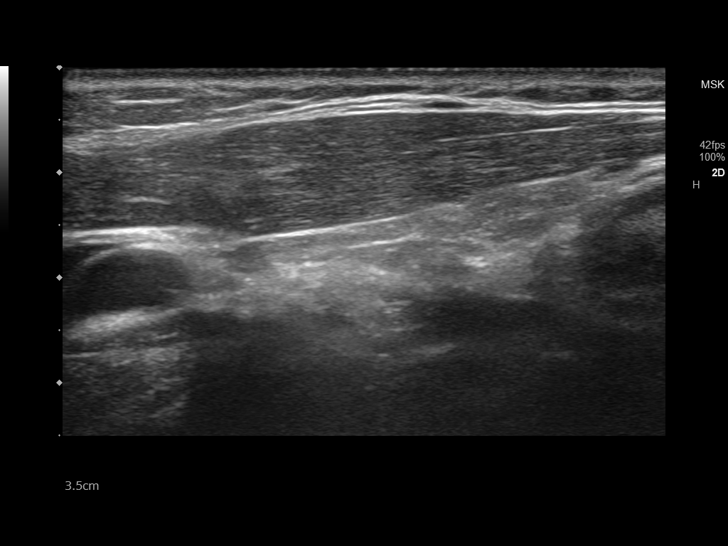
[im 9/21]
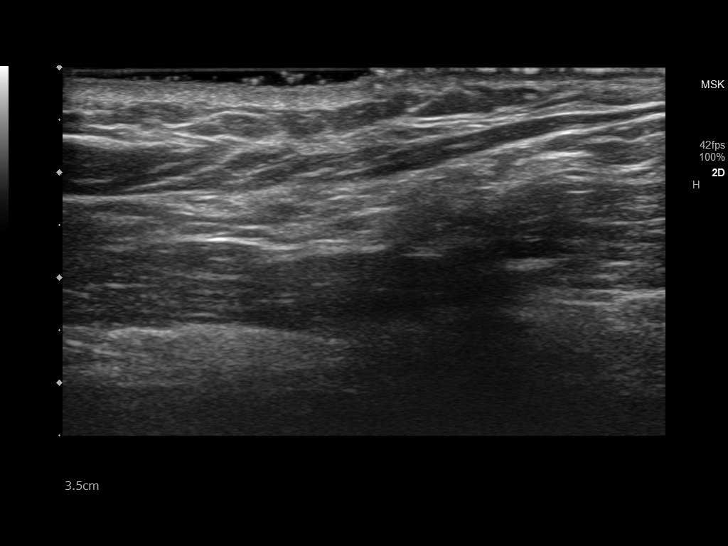
[im 10/21]
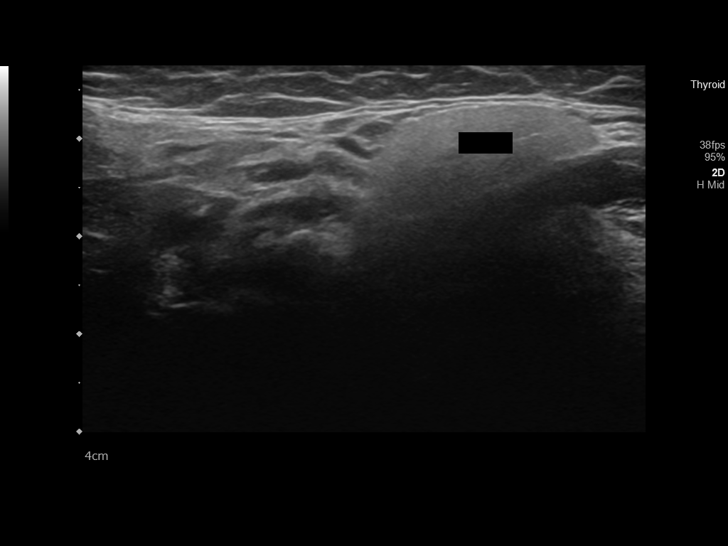
[im 12/21]
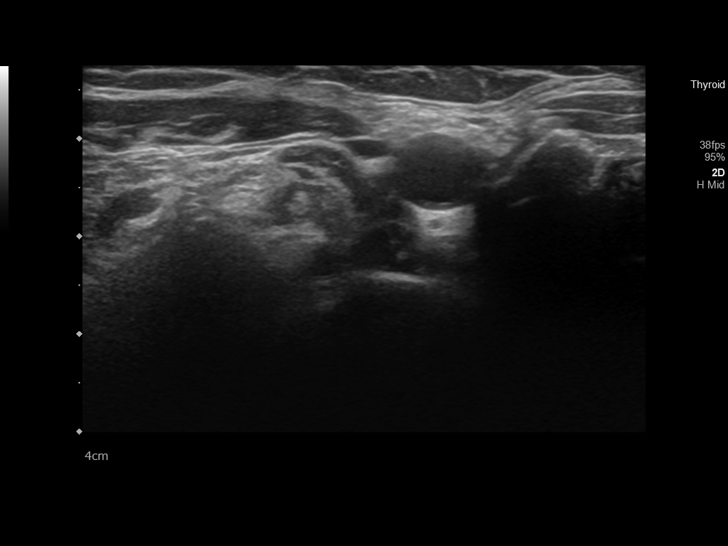
[im 13/21]
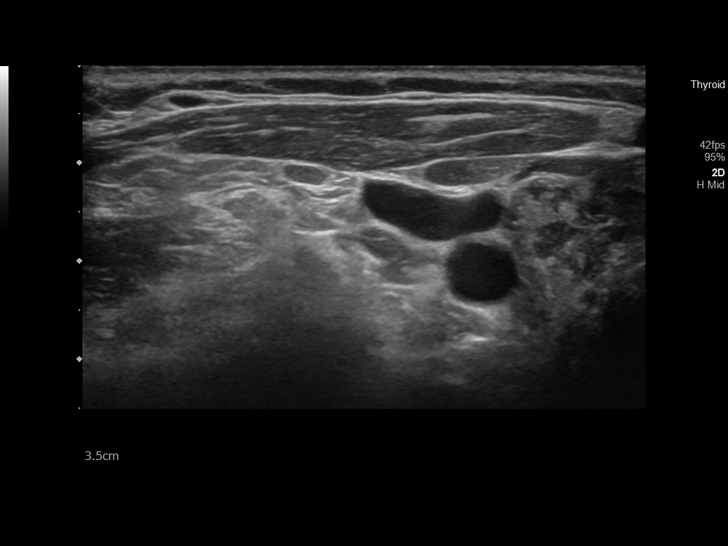
[im 15/21]
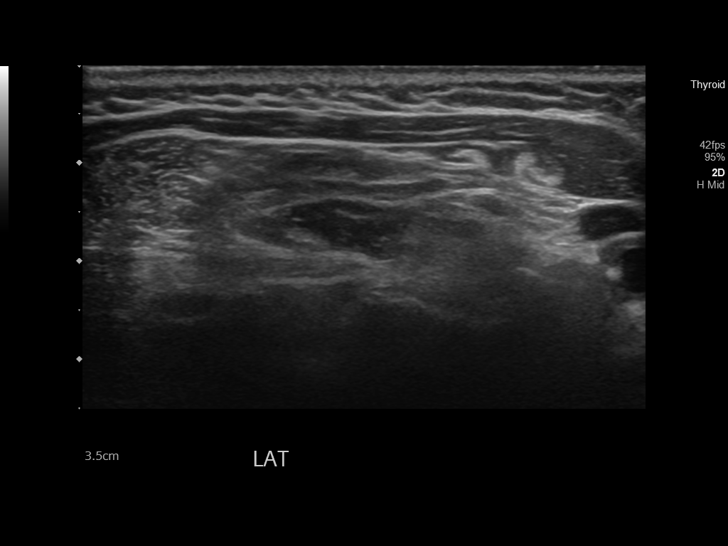
[im 16/21]
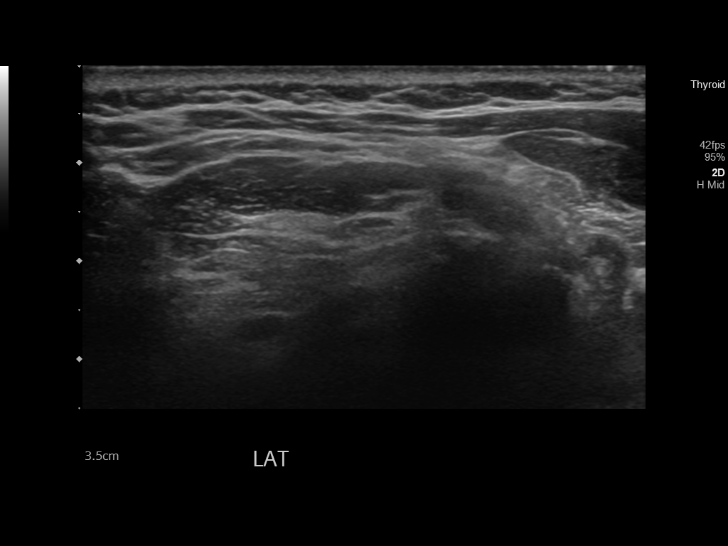
[im 18/21]
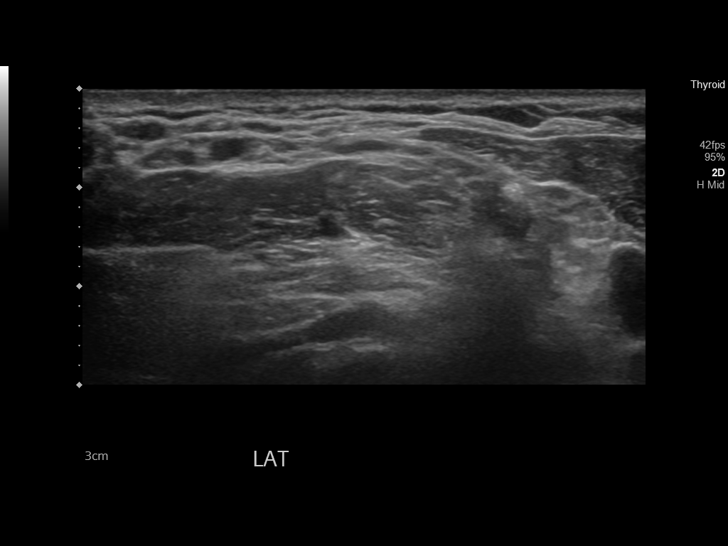
[im 19/21]
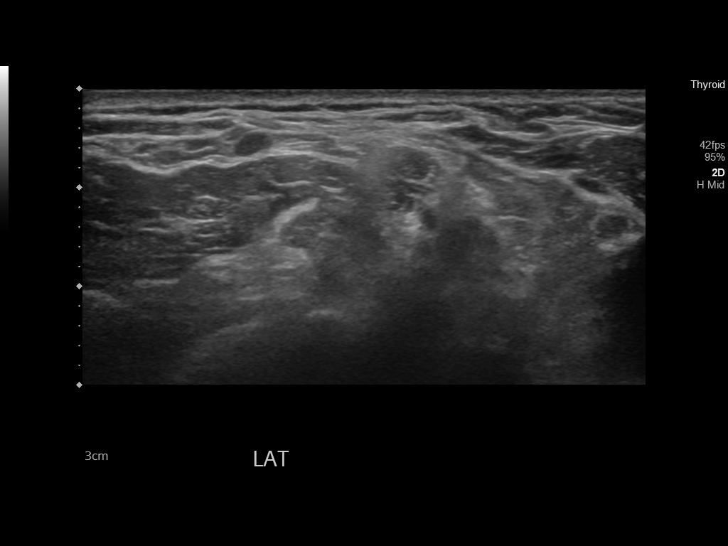
[im 21/21]
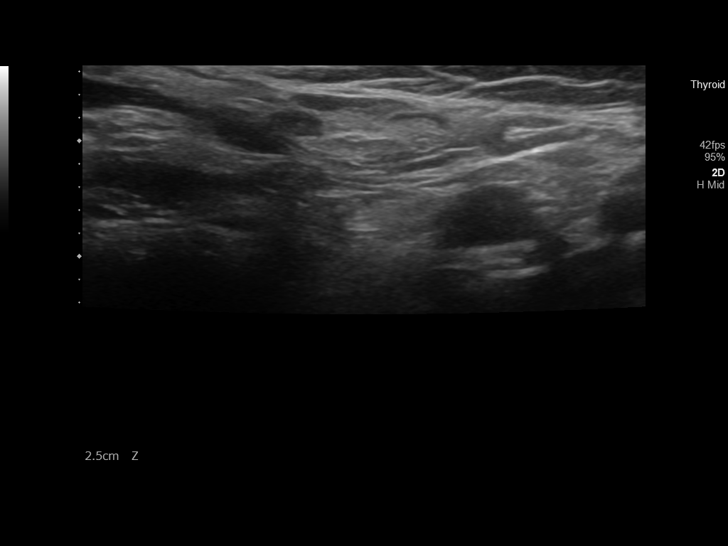

[14 of 21 positions shown; findings below may reference images not displayed]

FINDINGS: Scattered images through the symptomatic right neck show no mass,
inflammation, or adenopathy. Normal appearing vessels, salivary
tissue, muscle, and fat is seen on the provided images.
IMPRESSION: Negative ultrasound.  No explanation for symptoms.

## 2022-11-14 DIAGNOSIS — M810 Age-related osteoporosis without current pathological fracture: Secondary | ICD-10-CM | POA: Diagnosis not present

## 2022-11-21 ENCOUNTER — Ambulatory Visit (INDEPENDENT_AMBULATORY_CARE_PROVIDER_SITE_OTHER): Payer: PPO

## 2022-11-21 DIAGNOSIS — M810 Age-related osteoporosis without current pathological fracture: Secondary | ICD-10-CM | POA: Diagnosis not present

## 2022-11-21 DIAGNOSIS — E538 Deficiency of other specified B group vitamins: Secondary | ICD-10-CM

## 2022-11-21 MED ORDER — CYANOCOBALAMIN 1000 MCG/ML IJ SOLN
1000.0000 ug | Freq: Once | INTRAMUSCULAR | Status: AC
  Administered 2022-11-21: 1000 ug via INTRAMUSCULAR

## 2023-01-02 ENCOUNTER — Ambulatory Visit: Payer: PPO

## 2023-01-05 ENCOUNTER — Ambulatory Visit (INDEPENDENT_AMBULATORY_CARE_PROVIDER_SITE_OTHER): Payer: PPO

## 2023-01-05 DIAGNOSIS — E538 Deficiency of other specified B group vitamins: Secondary | ICD-10-CM

## 2023-01-05 MED ORDER — CYANOCOBALAMIN 1000 MCG/ML IJ SOLN
1000.0000 ug | Freq: Once | INTRAMUSCULAR | Status: AC
  Administered 2023-01-05: 1000 ug via INTRAMUSCULAR

## 2023-01-16 ENCOUNTER — Other Ambulatory Visit: Payer: Self-pay | Admitting: Family Medicine

## 2023-01-16 DIAGNOSIS — F411 Generalized anxiety disorder: Secondary | ICD-10-CM

## 2023-01-20 DIAGNOSIS — H401132 Primary open-angle glaucoma, bilateral, moderate stage: Secondary | ICD-10-CM | POA: Diagnosis not present

## 2023-01-20 DIAGNOSIS — H16122 Filamentary keratitis, left eye: Secondary | ICD-10-CM | POA: Diagnosis not present

## 2023-01-30 DIAGNOSIS — M810 Age-related osteoporosis without current pathological fracture: Secondary | ICD-10-CM | POA: Diagnosis not present

## 2023-02-02 ENCOUNTER — Ambulatory Visit (INDEPENDENT_AMBULATORY_CARE_PROVIDER_SITE_OTHER): Payer: PPO

## 2023-02-02 DIAGNOSIS — E538 Deficiency of other specified B group vitamins: Secondary | ICD-10-CM | POA: Diagnosis not present

## 2023-02-02 DIAGNOSIS — H16122 Filamentary keratitis, left eye: Secondary | ICD-10-CM | POA: Diagnosis not present

## 2023-02-02 MED ORDER — CYANOCOBALAMIN 1000 MCG/ML IJ SOLN
1000.0000 ug | Freq: Once | INTRAMUSCULAR | Status: AC
  Administered 2023-02-02: 1000 ug via INTRAMUSCULAR

## 2023-02-10 ENCOUNTER — Other Ambulatory Visit: Payer: Self-pay | Admitting: Family Medicine

## 2023-02-10 DIAGNOSIS — F411 Generalized anxiety disorder: Secondary | ICD-10-CM

## 2023-02-23 ENCOUNTER — Other Ambulatory Visit: Payer: Self-pay | Admitting: Family Medicine

## 2023-02-23 DIAGNOSIS — G4700 Insomnia, unspecified: Secondary | ICD-10-CM

## 2023-02-24 DIAGNOSIS — H16122 Filamentary keratitis, left eye: Secondary | ICD-10-CM | POA: Diagnosis not present

## 2023-02-24 DIAGNOSIS — H402213 Chronic angle-closure glaucoma, right eye, severe stage: Secondary | ICD-10-CM | POA: Diagnosis not present

## 2023-02-24 DIAGNOSIS — H2513 Age-related nuclear cataract, bilateral: Secondary | ICD-10-CM | POA: Diagnosis not present

## 2023-02-27 ENCOUNTER — Other Ambulatory Visit: Payer: Self-pay | Admitting: Family Medicine

## 2023-02-27 DIAGNOSIS — G4700 Insomnia, unspecified: Secondary | ICD-10-CM

## 2023-03-06 NOTE — Progress Notes (Unsigned)
Name: Dawn Fuentes   MRN: [id number]    DOB: 03/23/1946   Date:03/07/2023       Progress Note  Subjective  Chief Complaint  Follow Up  HPI  GAD: she was doing well on Citalopram, however neurologist switched her from citalopram to duloxetine 20 mg back in September but it caused increase in anxiety . She is back on Citalopram and is working better, she also takes alprazolam prn only. She states taking a little more often due to recent move. Bought a Games developer and is selling her old house. She has noticed that is harder to fall asleep on the new house and taking ambien every night again  Hypothyroidism: last TSH was elevated, taking levothyroxine, she just moved and is feeling tired. Denies weight gain, dysphagia or hair loss She states skin normally dry this time of the year   CKI: avoiding nsaid's  denies pruritis or decrease in urinary output, she is on ARB , we will recheck labs today . Discussed checking parathyroid , vitamin D level and GFR, also CBC   Headaches: under the care of neurologist, she is now taking propanolol 60 mg and states headache still most days of the week, however now headache resolves with one dose of Tylenol - goes away within 5-10 minutes.   Post menopausal osteoporosis: still getting Prolia from Endo   Patient Active Problem List   Diagnosis Date Noted   Lichen simplex [id number]   Chronic nonintractable headache 07/06/2021   Cervicogenic migraine 01/14/2021   Cervical facet joint syndrome 01/14/2021   Cervical radiculitis 12/01/2020   Bilateral hip pain 12/01/2020   Chronic bilateral low back pain without sciatica 12/01/2020   Chronic SI joint pain 12/01/2020   Chronic pain syndrome 12/01/2020   Body mass index (BMI) 30.0-30.9, adult 11/18/2020   Essential (primary) hypertension 11/18/2020   Cervical radicular pain 11/18/2020   Degeneration of cervical intervertebral disc [id number]   Lichen sclerosus et atrophicus of the vulva 11/04/2019   [name]  deficiency 12/26/2018   Osteoarthritis of knees, bilateral 05/26/2017   Primary open angle glaucoma of both eyes 11/21/2016   Chronic kidney disease (CKD), stage III (moderate) ([place]) 04/06/2016   Cystitis 10/12/2015   Benign hypertension 08/21/2015   Insomnia, persistent 08/21/2015   Dyslipidemia 08/21/2015   Gastro-esophageal reflux disease without esophagitis 08/21/2015   Glaucoma 08/21/2015   Blood glucose elevated 08/21/2015   Adult hypothyroidism 08/21/2015   Climacteric 08/21/2015   Senile osteoporosis 08/21/2015   Seasonal allergies 08/21/2015   Female genuine stress incontinence 08/21/2015   Vitamin D deficiency 08/21/2015    Past Surgical History:  Procedure Laterality Date   ABDOMINAL HYSTERECTOMY     21 years ago   APPENDECTOMY     COLONOSCOPY N/A 09/14/2020   Procedure: COLONOSCOPY;  Surgeon: Lesly Rubenstein, MD;  Location: [facility] ENDOSCOPY;  Service: Endoscopy;  Laterality: N/A;   COLONOSCOPY WITH PROPOFOL N/A 05/29/2015   Procedure: COLONOSCOPY WITH PROPOFOL;  Surgeon: Hulen Luster, MD;  Location[facility] ENDOSCOPY;  Service: Gastroenterology;  Laterality: N/A;   ESOPHAGOGASTRODUODENOSCOPY N/A 05/29/2015   Procedure: ESOPHAGOGASTRODUODENOSCOPY (EGD);  Surgeon: Hulen Luster, MD;  Location: [facility] ENDOSCOPY;  Service: Gastroenterology;  Laterality: N/A;   ESOPHAGOGASTRODUODENOSCOPY N/A 09/14/2020   Procedure: ESOPHAGOGASTRODUODENOSCOPY (EGD);  Surgeon: Lesly Rubenstein, MD;  Location: [facility] ENDOSCOPY;  Service: Endoscopy;  Laterality: N/A;   EYE SURGERY     eye lids   OVARIAN CYST REMOVAL     needed transfusion   PAROTIDECTOMY Right 05/05/2021  Procedure: SUPERFICIAL PAROTIDECTOMY;  Surgeon: Clyde Canterbury, MD;  Location: [facility] ORS;  Service: ENT;  Laterality: Right;   PHOTOCOAGULATION WITH LASER Right 11/21/2016   Procedure: PHOTOCOAGULATION WITH LASER;  Surgeon: Ronnell Freshwater, MD;  Location: [place];  Service: Ophthalmology;  Laterality: Right;  A  micropulse probe was applied to each hemilimbus with the following settings: 2059mW, 31.3% duty cycle, 90 seconds x 3 applications.    TONSILLECTOMY      Family History  Problem Relation Age of Onset   Hypertension Mother    Osteoporosis Mother    COPD Mother    Dementia Mother    Cancer Father        Colon   Hypothyroidism Daughter    Aneurysm Brother    COPD Sister     Social History   Tobacco Use   Smoking status: Never   Smokeless tobacco: Never   Tobacco comments:    smoking cessation materials not required  Substance Use Topics   Alcohol use: No    Alcohol/week: 0.0 standard drinks of alcohol     Current Outpatient Medications:    ALPRAZolam (XANAX) 0.25 MG tablet, Take 1 tablet (0.25 mg total) by mouth daily as needed for anxiety. Rarely, Disp: 30 tablet, Rfl: 0   baclofen (LIORESAL) 10 MG tablet, Take 10 mg by mouth 3 (three) times daily., Disp: , Rfl:    Calcium Carb-Cholecalciferol 600-800 MG-UNIT TABS, Take 1 tablet by mouth daily., Disp: , Rfl:    citalopram (CELEXA) 20 MG tablet, Take 1 tablet (20 mg total) by mouth daily., Disp: 30 tablet, Rfl: 0   clobetasol ointment (TEMOVATE) [id number] %, Apply 1 application. topically 2 (two) times a week., Disp: 30 g, Rfl: 2   Cyanocobalamin (VITAMIN B-12 IJ), Inject as directed every 30 (thirty) days., Disp: , Rfl:    denosumab (PROLIA) 60 MG/ML SOSY injection, Inject 60 mg into the skin every 6 (six) months., Disp: , Rfl:    levocetirizine (XYZAL) 5 MG tablet, Take 1 tablet (5 mg total) by mouth every evening., Disp: 90 tablet, Rfl: 1   levothyroxine (SYNTHROID) 50 MCG tablet, TAKE 2 TABLETS BY MOUTH DAILY BEFORE BREAKFAST ON MONDAY, WEDNESDAY, FRIDAY, AND 1 TABLET ON ALL OTHER DAYS OF THE WEEK Strength: 50 mcg, Disp: 128 tablet, Rfl: 1   losartan-hydrochlorothiazide (HYZAAR) 100-12.5 MG tablet, Take 0.5 tablets by mouth daily., Disp: 45 tablet, Rfl: 0   Omega-3 Fatty Acids (FISH OIL) 1000 MG CAPS, Take 1,000 mg by mouth  daily., Disp: , Rfl:    pantoprazole (PROTONIX) 40 MG tablet, Take 1 tablet (40 mg total) by mouth daily., Disp: 90 tablet, Rfl: 1   propranolol ER (INDERAL LA) 60 MG 24 hr capsule, Take 60 mg by mouth daily., Disp: , Rfl:    simvastatin (ZOCOR) 40 MG tablet, Take 1 tablet (40 mg total) by mouth every evening., Disp: 90 tablet, Rfl: 1   timolol (TIMOPTIC) 0.5 % ophthalmic solution, Place 1 drop into both eyes 2 (two) times daily., Disp: , Rfl: 5   Turmeric 500 MG TABS, Take 500 mg by mouth in the morning and at bedtime., Disp: , Rfl:    VYZULTA 0.024 % SOLN, Place 1 drop into both eyes at bedtime., Disp: , Rfl: 2   zolpidem (AMBIEN) 5 MG tablet, Take 1 tablet (5 mg total) by mouth at bedtime., Disp: 90 tablet, Rfl: 1   nitrofurantoin, macrocrystal-monohydrate, (MACROBID) 100 MG capsule, Take 1 capsule (100 mg total) by mouth 2 (two) times  daily. (Patient not taking: Reported on 03/07/2023), Disp: 10 capsule, Rfl: 1  Current Facility-Administered Medications:    cyanocobalamin ((VITAMIN B-12)) injection 1,000 mcg, 1,000 mcg, Intramuscular, Q30 days, Marshaun Lortie, MD, 1,000 mcg at 03/08/22 1426  Allergies  Allergen Reactions   Brimonidine Tartrate Other (See Comments)    Burning in eyes   Latanoprost Other (See Comments)    burning   Meloxicam     Increased blood pressure    Raloxifene     bone pain    I personally reviewed active problem list, medication list, allergies, family history, social history, health maintenance with the patient/caregiver today.   ROS  Constitutional: Negative for fever or weight change.  Respiratory: Negative for cough and shortness of breath.   Cardiovascular: Negative for chest pain or palpitations.  Gastrointestinal: Negative for abdominal pain, no bowel changes.  Musculoskeletal: Negative for gait problem or joint swelling.  Skin: Negative for rash.  Neurological: Negative for dizziness or headache.  No other specific complaints in a complete  review of systems (except as listed in HPI above).   Objective  Vitals:   03/07/23 1315  BP: 130/72  Pulse: 69  Resp: 16  SpO2: 96%  Weight: 183 lb (83 kg)  Height: 5\' 5"  (1.651 m)    Body mass index is 30.45 kg/m.  Physical Exam  Constitutional: Patient appears well-developed and well-nourished. Obese  No distress.  HEENT: head atraumatic, normocephalic, pupils equal and reactive to light, neck supple Cardiovascular: Normal rate, regular rhythm and normal heart sounds.  No murmur heard. No BLE edema. Pulmonary/Chest: Effort normal and breath sounds normal. No respiratory distress. Abdominal: Soft.  There is no tenderness. Psychiatric: Patient has a normal mood and affect. behavior is normal. Judgment and thought content normal.   PHQ2/9:    03/07/2023    1:14 PM 10/12/2022    3:52 PM 09/07/2022    1:16 PM 03/08/2022    1:40 PM 09/16/2021    9:34 AM  Depression screen PHQ 2/9  Decreased Interest 0 0 0 0 0  Down, Depressed, Hopeless 0 0 0 0 0  PHQ - 2 Score 0 0 0 0 0  Altered sleeping 0 1 0 0   Tired, decreased energy 0 1 0 0   Change in appetite 0 0 0 0   Feeling bad or failure about yourself  0 0 0 0   Trouble concentrating 0 0 0 0   Moving slowly or fidgety/restless 0 0 0 0   Suicidal thoughts 0 0 0 0   PHQ-9 Score 0 2 0 0   Difficult doing work/chores  Not difficult at all Not difficult at all      phq 9 is negative   Fall Risk:    03/07/2023    1:14 PM 10/12/2022    3:50 PM 09/07/2022    1:16 PM 03/08/2022    1:40 PM 11/16/2021   11:16 AM  Fall Risk   Falls in the past year? 0 0 0 0 0  Number falls in past yr: 0  0 0   Injury with Fall? 0  0 0   Risk for fall due to : No Fall Risks No Fall Risks No Fall Risks No Fall Risks   Follow up Falls prevention discussed Falls prevention discussed;Education provided;Falls evaluation completed Falls prevention discussed;Education provided Falls prevention discussed       Functional Status Survey: Is the patient  deaf or have difficulty hearing?: No Does the patient have difficulty  seeing, even when wearing glasses/contacts?: No Does the patient have difficulty concentrating, remembering, or making decisions?: No Does the patient have difficulty walking or climbing stairs?: No Does the patient have difficulty dressing or bathing?: No Does the patient have difficulty doing errands alone such as visiting a doctor's office or shopping?: No    Assessment & Plan  1. Stage 3a chronic kidney disease (HCC)  - COMPLETE METABOLIC PANEL WITH GFR - PTH, intact and calcium  2. Vitamin [name] deficiency  - cyanocobalamin (VITAMIN [name]) injection 1,000 mcg - [name] and Folate Panel  3. Adult hypothyroidism  - TSH  4. GAD (generalized anxiety disorder)  - citalopram (CELEXA) 20 MG tablet; Take 1 tablet (20 mg total) by mouth daily.  Dispense: 90 tablet; Refill: 1  5. Vitamin D deficiency  - VITAMIN D 25 Hydroxy (Vit-D Deficiency, Fractures)  6. Gastro-esophageal reflux disease without esophagitis  - pantoprazole (PROTONIX) 40 MG tablet; Take 1 tablet (40 mg total) by mouth daily.  Dispense: 90 tablet; Refill: 1  7. Dyslipidemia  - Lipid panel - simvastatin (ZOCOR) 40 MG tablet; Take 1 tablet (40 mg total) by mouth every evening.  Dispense: 90 tablet; Refill: 1  8. Insomnia, persistent  - zolpidem (AMBIEN) 5 MG tablet; Take 1 tablet (5 mg total) by mouth at bedtime.  Dispense: 90 tablet; Refill: 1  9. Benign hypertension  - COMPLETE METABOLIC PANEL WITH GFR - CBC with Differential/Platelet

## 2023-03-07 ENCOUNTER — Encounter: Payer: Self-pay | Admitting: Family Medicine

## 2023-03-07 ENCOUNTER — Ambulatory Visit (INDEPENDENT_AMBULATORY_CARE_PROVIDER_SITE_OTHER): Payer: PPO | Admitting: Family Medicine

## 2023-03-07 VITALS — BP 130/72 | HR 69 | Resp 16 | Ht 65.0 in | Wt 183.0 lb

## 2023-03-07 DIAGNOSIS — K219 Gastro-esophageal reflux disease without esophagitis: Secondary | ICD-10-CM

## 2023-03-07 DIAGNOSIS — E785 Hyperlipidemia, unspecified: Secondary | ICD-10-CM

## 2023-03-07 DIAGNOSIS — G4700 Insomnia, unspecified: Secondary | ICD-10-CM

## 2023-03-07 DIAGNOSIS — N1831 Chronic kidney disease, stage 3a: Secondary | ICD-10-CM

## 2023-03-07 DIAGNOSIS — I10 Essential (primary) hypertension: Secondary | ICD-10-CM

## 2023-03-07 DIAGNOSIS — E039 Hypothyroidism, unspecified: Secondary | ICD-10-CM

## 2023-03-07 DIAGNOSIS — E559 Vitamin D deficiency, unspecified: Secondary | ICD-10-CM

## 2023-03-07 DIAGNOSIS — F411 Generalized anxiety disorder: Secondary | ICD-10-CM

## 2023-03-07 DIAGNOSIS — E538 Deficiency of other specified B group vitamins: Secondary | ICD-10-CM | POA: Diagnosis not present

## 2023-03-07 MED ORDER — SIMVASTATIN 40 MG PO TABS: 40.0000 mg | ORAL_TABLET | Freq: Every evening | ORAL | 1 refills | Status: DC

## 2023-03-07 MED ORDER — LOSARTAN POTASSIUM-HCTZ 50-12.5 MG PO TABS: 1.0000 | ORAL_TABLET | Freq: Every day | ORAL | 0 refills | Status: DC

## 2023-03-07 MED ORDER — ZOLPIDEM TARTRATE 5 MG PO TABS: 5.0000 mg | ORAL_TABLET | Freq: Every day | ORAL | 1 refills | Status: DC

## 2023-03-07 MED ORDER — CYANOCOBALAMIN 1000 MCG/ML IJ SOLN
1000.0000 ug | Freq: Once | INTRAMUSCULAR | Status: AC
  Administered 2023-03-07: 1000 ug via INTRAMUSCULAR

## 2023-03-07 MED ORDER — PANTOPRAZOLE SODIUM 40 MG PO TBEC: 40.0000 mg | DELAYED_RELEASE_TABLET | Freq: Every day | ORAL | 1 refills | Status: DC

## 2023-03-07 MED ORDER — CITALOPRAM HYDROBROMIDE 20 MG PO TABS: 20.0000 mg | ORAL_TABLET | Freq: Every day | ORAL | 1 refills | Status: DC

## 2023-03-08 DIAGNOSIS — I10 Essential (primary) hypertension: Secondary | ICD-10-CM | POA: Diagnosis not present

## 2023-03-08 DIAGNOSIS — E785 Hyperlipidemia, unspecified: Secondary | ICD-10-CM | POA: Diagnosis not present

## 2023-03-08 DIAGNOSIS — N1831 Chronic kidney disease, stage 3a: Secondary | ICD-10-CM | POA: Diagnosis not present

## 2023-03-08 DIAGNOSIS — E559 Vitamin D deficiency, unspecified: Secondary | ICD-10-CM | POA: Diagnosis not present

## 2023-03-08 DIAGNOSIS — E039 Hypothyroidism, unspecified: Secondary | ICD-10-CM | POA: Diagnosis not present

## 2023-03-08 DIAGNOSIS — E538 Deficiency of other specified B group vitamins: Secondary | ICD-10-CM | POA: Diagnosis not present

## 2023-03-09 ENCOUNTER — Other Ambulatory Visit: Payer: Self-pay | Admitting: Family Medicine

## 2023-03-09 DIAGNOSIS — E039 Hypothyroidism, unspecified: Secondary | ICD-10-CM

## 2023-03-09 LAB — CBC WITH DIFFERENTIAL/PLATELET
Absolute Monocytes: 779 {cells}/uL (ref 200–950)
Basophils Absolute: 127 {cells}/uL (ref 0–200)
Basophils Relative: 2.4 %
Eosinophils Absolute: 260 {cells}/uL (ref 15–500)
Eosinophils Relative: 4.9 %
HCT: 37.9 % (ref 35.0–45.0)
Hemoglobin: 12.3 g/dL (ref 11.7–15.5)
Lymphs Abs: 1436 {cells}/uL (ref 850–3900)
MCH: 28.9 pg (ref 27.0–33.0)
MCHC: 32.5 g/dL (ref 32.0–36.0)
MCV: 89.2 fL (ref 80.0–100.0)
MPV: 11.5 fL (ref 7.5–12.5)
Monocytes Relative: 14.7 %
Neutro Abs: 2698 {cells}/uL (ref 1500–7800)
Neutrophils Relative %: 50.9 %
Platelets: 281 10*3/uL (ref 140–400)
RBC: 4.25 Million/uL (ref 3.80–5.10)
RDW: 12 % (ref 11.0–15.0)
Total Lymphocyte: 27.1 %
WBC: 5.3 10*3/uL (ref 3.8–10.8)

## 2023-03-09 LAB — PTH, INTACT AND CALCIUM
Calcium: 9.9 mg/dL (ref 8.6–10.4)
PTH: 68 pg/mL (ref 16–77)

## 2023-03-09 LAB — COMPLETE METABOLIC PANEL WITHOUT GFR
AG Ratio: 2 (calc) (ref 1.0–2.5)
ALT: 21 U/L (ref 6–29)
AST: 21 U/L (ref 10–35)
Albumin: 4.3 g/dL (ref 3.6–5.1)
Alkaline phosphatase (APISO): 56 U/L (ref 37–153)
BUN/Creatinine Ratio: 17 (calc) (ref 6–22)
BUN: 21 mg/dL (ref 7–25)
CO2: 24 mmol/L (ref 20–32)
Calcium: 9.9 mg/dL (ref 8.6–10.4)
Chloride: 104 mmol/L (ref 98–110)
Creat: 1.23 mg/dL — ABNORMAL HIGH (ref 0.60–1.00)
Globulin: 2.2 g/dL (ref 1.9–3.7)
Glucose, Bld: 125 mg/dL — ABNORMAL HIGH (ref 65–99)
Potassium: 4.4 mmol/L (ref 3.5–5.3)
Sodium: 140 mmol/L (ref 135–146)
Total Bilirubin: 0.2 mg/dL (ref 0.2–1.2)
Total Protein: 6.5 g/dL (ref 6.1–8.1)
eGFR: 46 mL/min/{1.73_m2} — ABNORMAL LOW

## 2023-03-09 LAB — B12 AND FOLATE PANEL
Folate: 10.3 ng/mL
Vitamin B-12: 1836 pg/mL — ABNORMAL HIGH (ref 200–1100)

## 2023-03-09 LAB — LIPID PANEL
Cholesterol: 145 mg/dL
HDL: 55 mg/dL
LDL Cholesterol (Calc): 67 mg/dL
Non-HDL Cholesterol (Calc): 90 mg/dL
Total CHOL/HDL Ratio: 2.6 (calc)
Triglycerides: 146 mg/dL

## 2023-03-09 LAB — VITAMIN D 25 HYDROXY (VIT D DEFICIENCY, FRACTURES): Vit D, 25-Hydroxy: 41 ng/mL (ref 30–100)

## 2023-03-09 LAB — TSH: TSH: 5.22 m[IU]/L — ABNORMAL HIGH (ref 0.40–4.50)

## 2023-03-09 MED ORDER — LEVOTHYROXINE SODIUM 75 MCG PO TABS: 75.0000 ug | ORAL_TABLET | Freq: Every day | ORAL | 0 refills | Status: DC

## 2023-03-13 DIAGNOSIS — H16122 Filamentary keratitis, left eye: Secondary | ICD-10-CM | POA: Diagnosis not present

## 2023-03-20 DIAGNOSIS — H401132 Primary open-angle glaucoma, bilateral, moderate stage: Secondary | ICD-10-CM | POA: Diagnosis not present

## 2023-03-23 DIAGNOSIS — H401113 Primary open-angle glaucoma, right eye, severe stage: Secondary | ICD-10-CM | POA: Diagnosis not present

## 2023-03-23 DIAGNOSIS — H401122 Primary open-angle glaucoma, left eye, moderate stage: Secondary | ICD-10-CM | POA: Diagnosis not present

## 2023-03-24 ENCOUNTER — Other Ambulatory Visit: Payer: Self-pay | Admitting: Internal Medicine

## 2023-03-24 ENCOUNTER — Telehealth: Payer: Self-pay | Admitting: Family Medicine

## 2023-03-24 NOTE — Telephone Encounter (Signed)
Pt is out of town and has left her Cholesterol med and was wondering if we can call her in about 6 or 7 pills to [facility] on [address] at [facility].

## 2023-04-06 ENCOUNTER — Telehealth: Payer: Self-pay | Admitting: Family Medicine

## 2023-04-06 NOTE — Telephone Encounter (Signed)
Contacted Dawn Fuentes to schedule their annual wellness visit. Appointment made for 05/18/2023.  [facility] TEAM Direct Dial: [phone]

## 2023-04-13 ENCOUNTER — Encounter: Payer: Self-pay | Admitting: Internal Medicine

## 2023-04-13 ENCOUNTER — Ambulatory Visit (INDEPENDENT_AMBULATORY_CARE_PROVIDER_SITE_OTHER): Payer: PPO | Admitting: Internal Medicine

## 2023-04-13 VITALS — BP 132/86 | HR 74 | Temp 98.0°F | Resp 18 | Ht 65.0 in | Wt 180.8 lb

## 2023-04-13 DIAGNOSIS — M62838 Other muscle spasm: Secondary | ICD-10-CM

## 2023-04-13 DIAGNOSIS — R051 Acute cough: Secondary | ICD-10-CM

## 2023-04-13 DIAGNOSIS — J302 Other seasonal allergic rhinitis: Secondary | ICD-10-CM

## 2023-04-13 DIAGNOSIS — J3089 Other allergic rhinitis: Secondary | ICD-10-CM

## 2023-04-13 MED ORDER — LEVOCETIRIZINE DIHYDROCHLORIDE 5 MG PO TABS: 5.0000 mg | ORAL_TABLET | Freq: Every evening | ORAL | 1 refills | Status: DC

## 2023-04-13 MED ORDER — BACLOFEN 10 MG PO TABS: 10.0000 mg | ORAL_TABLET | Freq: Three times a day (TID) | ORAL | 1 refills | Status: DC

## 2023-04-13 NOTE — Patient Instructions (Signed)
It was great seeing you today!  Plan discussed at today's visit: -Start Corcidin for high blood pressure for congestion -Xyzal refilled as well for allergies -Also recommend Flonase nasal steroid - use sprays on each side twice a day while symptomatic   Follow up in:  Take care and let us know if you have any questions or concerns prior to your next visit.  Dr. Caralee Ates

## 2023-04-13 NOTE — Progress Notes (Signed)
Acute Office Visit  Subjective:     Patient ID: Dawn Fuentes, female    DOB: 1946-02-09, 77 y.o.   MRN: [id number]  Chief Complaint  Patient presents with   URI    Congestion and cough, onset 3 days   Neck Pain    Wants refill on baclofen    HPI Patient is in today for congestion, cough for 3 days.  URI Compliant:  -Fever: no -Cough: yes, productive, light green  -Shortness of breath: no -Wheezing: no -Chest congestion: no -Nasal congestion: yes -Runny nose: yes -Post nasal drip: no -Sore throat: no -Sinus pressure: no -Headache: yes -Face pain: no -Ear pain: no  -Ear pressure: no  -Context: fluctuating -Treatments attempted: Tylenol   Review of Systems  Constitutional:  Negative for chills and fever.  HENT:  Positive for congestion. Negative for ear pain, sinus pain and sore throat.   Respiratory:  Positive for cough and sputum production. Negative for shortness of breath and wheezing.   Cardiovascular:  Negative for chest pain.  Neurological:  Positive for headaches.        Objective:    BP 132/86   Pulse 74   Temp 98 F (36.7 C)   Resp 18   Ht  (1.651 m)   Wt 180 lb 12.8 oz (82 kg)   LMP  (LMP Unknown)   SpO2 95%   BMI 30.09 kg/m    Physical Exam Constitutional:      Appearance: Normal appearance.  HENT:     Head: Normocephalic and atraumatic.     Right Ear: Tympanic membrane, ear canal and external ear normal.     Left Ear: Tympanic membrane, ear canal and external ear normal.     Nose: Congestion present.     Mouth/Throat:     Mouth: Mucous membranes are moist.     Comments: Postnasal drip present Eyes:     Conjunctiva/sclera: Conjunctivae normal.  Cardiovascular:     Rate and Rhythm: Normal rate.  Pulmonary:     Effort: Pulmonary effort is normal.     Breath sounds: Normal breath sounds.  Musculoskeletal:     Cervical back: No tenderness.  Lymphadenopathy:     Cervical: No cervical adenopathy.  Skin:    General: Skin  is warm and dry.  Neurological:     General: No focal deficit present.     Mental Status: She is alert. Mental status is at baseline.  Psychiatric:        Mood and Affect: Mood normal.        Behavior: Behavior normal.     No results found for any visits on 04/13/23.      Assessment & Plan:   1. Perennial allergic rhinitis with seasonal variation/Acute cough: Symptoms consistent with allergic rhinitis.  Patient will start taking Xyzal again, refilled.  Will also take Coricidin decongestant for high blood pressure and Flonase as needed for symptoms.  COVID test ordered today.  Patient will follow-up if symptoms worsen or fail to improve.  - levocetirizine (XYZAL) 5 MG tablet; Take 1 tablet (5 mg total) by mouth every evening.  Dispense: 90 tablet; Refill: 1 - Novel Coronavirus, NAA ([facility])  2. Muscle spasms of neck: Baclofen refilled for chronic muscle spasms of neck.  Patient is taking 10 mg once or twice a day.  - baclofen (LIORESAL) 10 MG tablet; Take 1 tablet (10 mg total) by mouth 3 (three) times daily.  Dispense: 30 each; Refill: 1  Return  if symptoms worsen or fail to improve.  Margarita Mail, DO

## 2023-04-14 LAB — NOVEL CORONAVIRUS, NAA: SARS-CoV-2, NAA: NOT DETECTED

## 2023-05-03 ENCOUNTER — Other Ambulatory Visit: Payer: Self-pay

## 2023-05-03 DIAGNOSIS — D110 Benign neoplasm of parotid gland: Secondary | ICD-10-CM | POA: Diagnosis not present

## 2023-05-03 DIAGNOSIS — E039 Hypothyroidism, unspecified: Secondary | ICD-10-CM | POA: Diagnosis not present

## 2023-05-04 ENCOUNTER — Other Ambulatory Visit: Payer: Self-pay | Admitting: Family Medicine

## 2023-05-04 DIAGNOSIS — H401122 Primary open-angle glaucoma, left eye, moderate stage: Secondary | ICD-10-CM | POA: Diagnosis not present

## 2023-05-04 DIAGNOSIS — E039 Hypothyroidism, unspecified: Secondary | ICD-10-CM

## 2023-05-04 DIAGNOSIS — H402213 Chronic angle-closure glaucoma, right eye, severe stage: Secondary | ICD-10-CM | POA: Diagnosis not present

## 2023-05-04 LAB — TSH: TSH: 0.29 m[IU]/L — ABNORMAL LOW (ref 0.40–4.50)

## 2023-05-18 ENCOUNTER — Ambulatory Visit (INDEPENDENT_AMBULATORY_CARE_PROVIDER_SITE_OTHER): Payer: PPO

## 2023-05-18 VITALS — Ht 65.0 in | Wt 180.0 lb

## 2023-05-18 DIAGNOSIS — Z0000 Encounter for general adult medical examination without abnormal findings: Secondary | ICD-10-CM

## 2023-05-18 NOTE — Progress Notes (Signed)
I connected with  Dawn Fuentes on 05/18/23 by a audio enabled telemedicine application and verified that I am speaking with the correct person using two identifiers.  Patient Location: Home  Provider Location: Office/Clinic  I discussed the limitations of evaluation and management by telemedicine. The patient expressed understanding and agreed to proceed.  Subjective:   Dawn Fuentes is a 77 y.o. female who presents for Medicare Annual (Subsequent) preventive examination.  Review of Systems           Objective:    There were no vitals filed for this visit. There is no height or weight on file to calculate BMI.     10/16/2021    4:00 PM 09/16/2021    9:36 AM 07/06/2021    1:46 PM 05/05/2021    7:27 AM 04/30/2021   11:59 AM 03/30/2021   12:09 PM 01/25/2021   11:54 AM  Advanced Directives  Does Patient Have a Medical Advance Directive? No Yes No No Yes Yes Yes  Type of Furniture conservator/restorer;Living will   Healthcare Power of [place];Living will Healthcare Power of [place];Living will   Copy of Healthcare Power of Attorney in Chart?  No - copy requested  No - copy requested No - copy requested    Would patient like information on creating a medical advance directive?    No - Patient declined       Current Medications (verified) Outpatient Encounter Medications as of 05/18/2023  Medication Sig   ALPRAZolam (XANAX) 0.25 MG tablet Take 1 tablet (0.25 mg total) by mouth daily as needed for anxiety. Rarely   baclofen (LIORESAL) 10 MG tablet Take 1 tablet (10 mg total) by mouth 3 (three) times daily.   Calcium Carb-Cholecalciferol 600-800 MG-UNIT TABS Take 1 tablet by mouth daily.   citalopram (CELEXA) 20 MG tablet Take 1 tablet (20 mg total) by mouth daily.   clobetasol ointment (TEMOVATE) 0.05 % Apply 1 application. topically 2 (two) times a week.   denosumab (PROLIA) 60 MG/ML SOSY injection Inject 60 mg into the skin every 6 (six) months.    levocetirizine (XYZAL) 5 MG tablet Take 1 tablet (5 mg total) by mouth every evening.   levothyroxine (SYNTHROID) 75 MCG tablet Take 1 tablet (75 mcg total) by mouth daily before breakfast.   losartan-hydrochlorothiazide (HYZAAR) 50-12.5 MG tablet Take 1 tablet by mouth daily.   Omega-3 Fatty Acids (FISH OIL) 1000 MG CAPS Take 1,000 mg by mouth daily.   pantoprazole (PROTONIX) 40 MG tablet Take 1 tablet (40 mg total) by mouth daily.   propranolol ER (INDERAL LA) 60 MG 24 hr capsule Take 60 mg by mouth daily.   simvastatin (ZOCOR) 40 MG tablet Take 1 tablet (40 mg total) by mouth every evening.   timolol (TIMOPTIC) 0.5 % ophthalmic solution Place 1 drop into both eyes 2 (two) times daily.   Turmeric 500 MG TABS Take 500 mg by mouth in the morning and at bedtime.   VYZULTA 0.024 % SOLN Place 1 drop into both eyes at bedtime.   zolpidem (AMBIEN) 5 MG tablet Take 1 tablet (5 mg total) by mouth at bedtime.   No facility-administered encounter medications on file as of 05/18/2023.    Allergies (verified) Brimonidine tartrate, Latanoprost, Meloxicam, and Raloxifene   History: Past Medical History:  Diagnosis Date   Allergy    Anxiety    ER visit in Sept 2017 for anxiety   Arthritis    Benign parotid tumor  Chronic insomnia    Colon adenoma    Dyslipidemia    Dysphagia    Female stress incontinence    GERD (gastroesophageal reflux disease)    Glaucoma, both eyes    Dr. Inez Pilgrim- [facility]   Headache    Hyperlipidemia    Hypertension    controlled on meds   Hypothyroidism    Menopause syndrome    Osteoporosis    Polyp of colon    Renal insufficiency    mild- keeping an eye on   Vitamin D deficiency    Past Surgical History:  Procedure Laterality Date   ABDOMINAL HYSTERECTOMY     21 years ago   APPENDECTOMY     COLONOSCOPY N/A 09/14/2020   Procedure: COLONOSCOPY;  Surgeon: Regis Bill, MD;  Location: [facility] ENDOSCOPY;  Service: Endoscopy;  Laterality: N/A;    COLONOSCOPY WITH PROPOFOL N/A 05/29/2015   Procedure: COLONOSCOPY WITH PROPOFOL;  Surgeon: Wallace Cullens, MD;  Location: [facility] ENDOSCOPY;  Service: Gastroenterology;  Laterality: N/A;   ESOPHAGOGASTRODUODENOSCOPY N/A 05/29/2015   Procedure: ESOPHAGOGASTRODUODENOSCOPY (EGD);  Surgeon: Wallace Cullens, MD;  Location: [facility] ENDOSCOPY;  Service: Gastroenterology;  Laterality: N/A;   ESOPHAGOGASTRODUODENOSCOPY N/A 09/14/2020   Procedure: ESOPHAGOGASTRODUODENOSCOPY (EGD);  Surgeon: Regis Bill, MD;  Location: [facility] ENDOSCOPY;  Service: Endoscopy;  Laterality: N/A;   EYE SURGERY     eye lids   OVARIAN CYST REMOVAL     needed transfusion   PAROTIDECTOMY Right 05/05/2021   Procedure: SUPERFICIAL PAROTIDECTOMY;  Surgeon: Geanie Logan, MD;  Location: [facility] ORS;  Service: ENT;  Laterality: Right;   PHOTOCOAGULATION WITH LASER Right 11/21/2016   Procedure: PHOTOCOAGULATION WITH LASER;  Surgeon: Sherald Hess, MD;  Location: [facility];  Service: Ophthalmology;  Laterality: Right;  A micropulse probe was applied to each hemilimbus with the following settings: , 31.3% duty cycle, 90 seconds x 3 applications.    TONSILLECTOMY     Family History  Problem Relation Age of Onset   Hypertension Mother    Osteoporosis Mother    COPD Mother    Dementia Mother    Cancer Father        Colon   Hypothyroidism Daughter    Aneurysm Brother    COPD Sister    Social History   Socioeconomic History   Marital status: Widowed    Spouse name: Onalee Hua   Number of children: 1   Years of education: Not on file   Highest education level: 12th grade  Occupational History   Occupation: Retired  Tobacco Use   Smoking status: Never   Smokeless tobacco: Never   Tobacco comments:    smoking cessation materials not required  Vaping Use   Vaping Use: Never used  Substance and Sexual Activity   Alcohol use: No    Alcohol/week: 0.0 standard drinks of alcohol   Drug use: No   Sexual activity: Yes     Partners: Male    Birth control/protection: Post-menopausal  Other Topics Concern   Not on file  Social History Narrative   Widow since 2013   Started to date in 2019   Social Determinants of Health   Financial Resource Strain: Low Risk  (09/16/2021)   Overall Financial Resource Strain (CARDIA)    Difficulty of Paying Living Expenses: Not hard at all  Food Insecurity: No Food Insecurity (09/16/2021)   Hunger Vital Sign    Worried About Running Out of Food in the Last Year: Never true  Ran Out of Food in the Last Year: Never true  Transportation Needs: No Transportation Needs (09/16/2021)   PRAPARE - Administrator, Civil Service (Medical): No    Lack of Transportation (Non-Medical): No  Physical Activity: Inactive (09/16/2021)   Exercise Vital Sign    Days of Exercise per Week: 0 days    Minutes of Exercise per Session: 0 min  Stress: No Stress Concern Present (09/16/2021)   [employer] of Occupational Health - Occupational Stress Questionnaire    Feeling of Stress : Not at all  Social Connections: Moderately Integrated (09/16/2021)   Social Connection and Isolation Panel [NHANES]    Frequency of Communication with Friends and Family: More than three times a week    Frequency of Social Gatherings with Friends and Family: More than three times a week    Attends Religious Services: More than 4 times per year    Active Member of [employer] or Organizations: Yes    Attends Banker Meetings: More than 4 times per year    Marital Status: Widowed    Tobacco Counseling Counseling given: Not Answered Tobacco comments: smoking cessation materials not required   Clinical Intake:              How often do you need to have someone help you when you read instructions, pamphlets, or other written materials from your doctor or pharmacy?: (P) 1 - Never  Diabetic?no         Activities of Daily Living    05/14/2023   11:08 AM 03/07/2023    1:14 PM   In your present state of health, do you have any difficulty performing the following activities:  Hearing? 0 0  Vision? 0 0  Difficulty concentrating or making decisions? 0 0  Walking or climbing stairs? 0 0  Dressing or bathing? 0 0  Doing errands, shopping? 0 0  Preparing Food and eating ? N   Using the Toilet? N   In the past six months, have you accidently leaked urine? Y   Do you have problems with loss of bowel control? N   Managing your Medications? N   Managing your Finances? N   Housekeeping or managing your Housekeeping? N     Patient Care Team: Alba Cory, MD as PCP - General (Family Medicine)  Indicate any recent Medical Services you may have received from other than [facility] providers in the past year (date may be approximate).     Assessment:   This is a routine wellness examination for Dawn Fuentes.  Hearing/Vision screen No results found.  Dietary issues and exercise activities discussed:     Goals Addressed   None    Depression Screen    03/07/2023    1:14 PM 10/12/2022    3:52 PM 09/07/2022    1:16 PM 03/08/2022    1:40 PM 09/16/2021    9:34 AM 09/10/2021    2:00 PM 07/06/2021    1:46 PM  PHQ 2/9 Scores  PHQ - 2 Score 0 0 0 0 0 0 0  PHQ- 9 Score 0 2 0 0  1     Fall Risk    05/14/2023   11:08 AM 03/07/2023    1:14 PM 10/12/2022    3:50 PM 09/07/2022    1:16 PM 03/08/2022    1:40 PM  Fall Risk   Falls in the past year? 0 0 0 0 0  Number falls in past yr:  0  0 0  Injury with Fall?  0  0 0  Risk for fall due to :  No Fall Risks No Fall Risks No Fall Risks No Fall Risks  Follow up  Falls prevention discussed Falls prevention discussed;Education provided;Falls evaluation completed Falls prevention discussed;Education provided Falls prevention discussed    FALL RISK PREVENTION PERTAINING TO THE HOME:  Any stairs in or around the home? No  If so, are there any without handrails? No  Home free of loose throw rugs in walkways, pet beds, electrical  cords, etc? Yes  Adequate lighting in your home to reduce risk of falls? Yes   ASSISTIVE DEVICES UTILIZED TO PREVENT FALLS:  Life alert? No  Use of a cane, walker or w/c? No  Grab bars in the bathroom? Yes  Shower chair or bench in shower? No  Elevated toilet seat or a handicapped toilet? Yes    Cognitive Function:        08/29/2019   11:24 AM 03/13/2018   10:22 AM  6CIT Screen  What Year? 0 points 0 points  What month? 0 points 0 points  What time? 0 points 0 points  Count back from 20 0 points 0 points  Months in reverse 0 points 0 points  Repeat phrase 0 points 0 points  Total Score 0 points 0 points    Immunizations Immunization History  Administered Date(s) Administered   Fluad Quad(high Dose 65+) 08/23/2019, 09/04/2020, 09/10/2021, 09/07/2022   Influenza Split 10/02/2008, 08/18/2010, 09/20/2012   Influenza, High Dose Seasonal PF 08/25/2015, 09/07/2016, 09/06/2017, 09/11/2018   Influenza, Seasonal, Injecte, Preservative Fre 10/09/2014   Influenza-Unspecified 10/09/2014   PFIZER(Purple Top)SARS-COV-2 Vaccination 01/31/2020, 02/21/2020, 12/29/2020   Pneumococcal Conjugate-13 08/18/2010, 03/08/2017   Pneumococcal Polysaccharide-23 12/30/2013   Pneumococcal-Unspecified 08/18/2010   Td 08/18/2010   Tdap 08/18/2010   Zoster Recombinat (Shingrix) 07/12/2018, 11/20/2018   Zoster, Live 07/24/2012    TDAP status: Up to date  Flu Vaccine status: Up to date  Pneumococcal vaccine status: Up to date  Covid-19 vaccine status: Completed vaccines  Qualifies for Shingles Vaccine? Yes   Zostavax completed Yes   Shingrix Completed?: Yes  Screening Tests Health Maintenance  Topic Date Due   COVID-19 Vaccine (4 - 2023-24 season) 08/19/2022   Medicare Annual Wellness (AWV)  09/16/2022   MAMMOGRAM  04/28/2023   INFLUENZA VACCINE  07/20/2023   Colonoscopy  09/14/2025   Pneumonia Vaccine 40+ Years old  Completed   DEXA SCAN  Completed   Hepatitis C Screening  Completed    Zoster Vaccines- Shingrix  Completed   HPV VACCINES  Aged Out   DTaP/Tdap/Td  Discontinued    Health Maintenance  Health Maintenance Due  Topic Date Due   COVID-19 Vaccine (4 - 2023-24 season) 08/19/2022   Medicare Annual Wellness (AWV)  09/16/2022   MAMMOGRAM  04/28/2023    Colorectal cancer screening: Type of screening: Colonoscopy. Completed yes. Repeat every 5 years  Mammogram status: Ordered yes. Pt provided with contact info and advised to call to schedule appt.   Bone Density status: Completed 5. Results reflect: Bone density results: OSTEOPOROSIS. Repeat every 5 years.  Lung Cancer Screening: (Low Dose CT Chest recommended if Age 21-80 years, 30 pack-year currently smoking OR have quit w/in 15years.) does not qualify.   Lung Cancer Screening Referral: no  Additional Screening:  Hepatitis C Screening: does not qualify; Completed yes  Vision Screening: Recommended annual ophthalmology exams for early detection of glaucoma and other disorders of the eye. Is the patient up to date  with their annual eye exam?  Yes  Who is the provider or what is the name of the office in which the patient attends annual eye exams? Dr Lillie Fragmin If pt is not established with a provider, would they like to be referred to a provider to establish care? No .   Dental Screening: Recommended annual dental exams for proper oral hygiene  Community Resource Referral / Chronic Care Management: CRR required this visit?  No   CCM required this visit?  No     Plan:     I have personally reviewed and noted the following in the patient's chart:   Medical and social history Use of alcohol, tobacco or illicit drugs  Current medications and supplements including opioid prescriptions. Patient is not currently taking opioid prescriptions. Functional ability and status Nutritional status Physical activity Advanced directives List of other physicians Hospitalizations, surgeries, and ER visits in  previous 12 months Vitals Screenings to include cognitive, depression, and falls Referrals and appointments  In addition, I have reviewed and discussed with patient certain preventive protocols, quality metrics, and best practice recommendations. A written personalized care plan for preventive services as well as general preventive health recommendations were provided to patient.    Dawn Lush, LPN   1/61/0960   Nurse Notes: The patient states she is doing well and has no concerns or questions at this time.

## 2023-05-18 NOTE — Patient Instructions (Signed)
Dawn Fuentes , Thank you for taking time to come for your Medicare Wellness Visit. I appreciate your ongoing commitment to your health goals. Please review the following plan we discussed and let me know if I can assist you in the future.   These are the goals we discussed:  Goals      DIET - INCREASE WATER INTAKE     Recommend to drink at least 6-8 8oz glasses of water per day.     Increase physical activity     Recommend increasing physical activity to at least 3 days per week        This is a list of the screening recommended for you and due dates:  Health Maintenance  Topic Date Due   COVID-19 Vaccine (4 - 2023-24 season) 08/19/2022   Mammogram  04/28/2023   Flu Shot  07/20/2023   Medicare Annual Wellness Visit  05/17/2024   Colon Cancer Screening  09/14/2025   Pneumonia Vaccine  Completed   DEXA scan (bone density measurement)  Completed   Hepatitis C Screening  Completed   Zoster (Shingles) Vaccine  Completed   HPV Vaccine  Aged Out   DTaP/Tdap/Td vaccine  Discontinued    Advanced directives: yes  Conditions/risks identified: none  Next appointment: Follow up in one year for your annual wellness visit 05/23/24 @10 :45 telephone   Preventive Care 65 Years and Older, Female Preventive care refers to lifestyle choices and visits with your health care provider that can promote health and wellness. What does preventive care include? A yearly physical exam. This is also called an annual well check. Dental exams once or twice a year. Routine eye exams. Ask your health care provider how often you should have your eyes checked. Personal lifestyle choices, including: Daily care of your teeth and gums. Regular physical activity. Eating a healthy diet. Avoiding tobacco and drug use. Limiting alcohol use. Practicing safe sex. Taking low-dose aspirin every day. Taking vitamin and mineral supplements as recommended by your health care provider. What happens during an annual  well check? The services and screenings done by your health care provider during your annual well check will depend on your age, overall health, lifestyle risk factors, and family history of disease. Counseling  Your health care provider may ask you questions about your: Alcohol use. Tobacco use. Drug use. Emotional well-being. Home and relationship well-being. Sexual activity. Eating habits. History of falls. Memory and ability to understand (cognition). Work and work Astronomer. Reproductive health. Screening  You may have the following tests or measurements: Height, weight, and BMI. Blood pressure. Lipid and cholesterol levels. These may be checked every 5 years, or more frequently if you are over 86 years old. Skin check. Lung cancer screening. You may have this screening every year starting at age 4 if you have a 30-pack-year history of smoking and currently smoke or have quit within the past 15 years. Fecal occult blood test (FOBT) of the stool. You may have this test every year starting at age 42. Flexible sigmoidoscopy or colonoscopy. You may have a sigmoidoscopy every 5 years or a colonoscopy every 10 years starting at age 75. Hepatitis C blood test. Hepatitis B blood test. Sexually transmitted disease (STD) testing. Diabetes screening. This is done by checking your blood sugar (glucose) after you have not eaten for a while (fasting). You may have this done every 1-3 years. Bone density scan. This is done to screen for osteoporosis. You may have this done starting at age 105. Mammogram.  This may be done every 1-2 years. Talk to your health care provider about how often you should have regular mammograms. Talk with your health care provider about your test results, treatment options, and if necessary, the need for more tests. Vaccines  Your health care provider may recommend certain vaccines, such as: Influenza vaccine. This is recommended every year. Tetanus, diphtheria,  and acellular pertussis (Tdap, Td) vaccine. You may need a Td booster every 10 years. Zoster vaccine. You may need this after age 30. Pneumococcal 13-valent conjugate (PCV13) vaccine. One dose is recommended after age 30. Pneumococcal polysaccharide (PPSV23) vaccine. One dose is recommended after age 71. Talk to your health care provider about which screenings and vaccines you need and how often you need them. This information is not intended to replace advice given to you by your health care provider. Make sure you discuss any questions you have with your health care provider. Document Released: 01/01/2016 Document Revised: 08/24/2016 Document Reviewed: 10/06/2015 Elsevier Interactive Patient Education  2017 [employer].  Fall Prevention in the Home Falls can cause injuries. They can happen to people of all ages. There are many things you can do to make your home safe and to help prevent falls. What can I do on the outside of my home? Regularly fix the edges of walkways and driveways and fix any cracks. Remove anything that might make you trip as you walk through a door, such as a raised step or threshold. Trim any bushes or trees on the path to your home. Use bright outdoor lighting. Clear any walking paths of anything that might make someone trip, such as rocks or tools. Regularly check to see if handrails are loose or broken. Make sure that both sides of any steps have handrails. Any raised decks and porches should have guardrails on the edges. Have any leaves, snow, or ice cleared regularly. Use sand or salt on walking paths during winter. Clean up any spills in your garage right away. This includes oil or grease spills. What can I do in the bathroom? Use night lights. Install grab bars by the toilet and in the tub and shower. Do not use towel bars as grab bars. Use non-skid mats or decals in the tub or shower. If you need to sit down in the shower, use a plastic, non-slip  stool. Keep the floor dry. Clean up any water that spills on the floor as soon as it happens. Remove soap buildup in the tub or shower regularly. Attach bath mats securely with double-sided non-slip rug tape. Do not have throw rugs and other things on the floor that can make you trip. What can I do in the bedroom? Use night lights. Make sure that you have a light by your bed that is easy to reach. Do not use any sheets or blankets that are too big for your bed. They should not hang down onto the floor. Have a firm chair that has side arms. You can use this for support while you get dressed. Do not have throw rugs and other things on the floor that can make you trip. What can I do in the kitchen? Clean up any spills right away. Avoid walking on wet floors. Keep items that you use a lot in easy-to-reach places. If you need to reach something above you, use a strong step stool that has a grab bar. Keep electrical cords out of the way. Do not use floor polish or wax that makes floors slippery. If you  must use wax, use non-skid floor wax. Do not have throw rugs and other things on the floor that can make you trip. What can I do with my stairs? Do not leave any items on the stairs. Make sure that there are handrails on both sides of the stairs and use them. Fix handrails that are broken or loose. Make sure that handrails are as long as the stairways. Check any carpeting to make sure that it is firmly attached to the stairs. Fix any carpet that is loose or worn. Avoid having throw rugs at the top or bottom of the stairs. If you do have throw rugs, attach them to the floor with carpet tape. Make sure that you have a light switch at the top of the stairs and the bottom of the stairs. If you do not have them, ask someone to add them for you. What else can I do to help prevent falls? Wear shoes that: Do not have high heels. Have rubber bottoms. Are comfortable and fit you well. Are closed at the  toe. Do not wear sandals. If you use a stepladder: Make sure that it is fully opened. Do not climb a closed stepladder. Make sure that both sides of the stepladder are locked into place. Ask someone to hold it for you, if possible. Clearly mark and make sure that you can see: Any grab bars or handrails. First and last steps. Where the edge of each step is. Use tools that help you move around (mobility aids) if they are needed. These include: Canes. Walkers. Scooters. Crutches. Turn on the lights when you go into a dark area. Replace any light bulbs as soon as they burn out. Set up your furniture so you have a clear path. Avoid moving your furniture around. If any of your floors are uneven, fix them. If there are any pets around you, be aware of where they are. Review your medicines with your doctor. Some medicines can make you feel dizzy. This can increase your chance of falling. Ask your doctor what other things that you can do to help prevent falls. This information is not intended to replace advice given to you by your health care provider. Make sure you discuss any questions you have with your health care provider. Document Released: 10/01/2009 Document Revised: 05/12/2016 Document Reviewed: 01/09/2015 Elsevier Interactive Patient Education  2017 [employer].

## 2023-05-29 ENCOUNTER — Other Ambulatory Visit: Payer: Self-pay | Admitting: Family Medicine

## 2023-05-29 DIAGNOSIS — E039 Hypothyroidism, unspecified: Secondary | ICD-10-CM

## 2023-05-30 ENCOUNTER — Other Ambulatory Visit: Payer: Self-pay | Admitting: Family Medicine

## 2023-05-30 DIAGNOSIS — E039 Hypothyroidism, unspecified: Secondary | ICD-10-CM | POA: Diagnosis not present

## 2023-05-31 ENCOUNTER — Other Ambulatory Visit: Payer: Self-pay | Admitting: Family Medicine

## 2023-05-31 DIAGNOSIS — E039 Hypothyroidism, unspecified: Secondary | ICD-10-CM

## 2023-05-31 LAB — TSH: TSH: 0.93 m[IU]/L (ref 0.40–4.50)

## 2023-05-31 MED ORDER — LEVOTHYROXINE SODIUM 75 MCG PO TABS: 75.0000 ug | ORAL_TABLET | Freq: Every day | ORAL | 0 refills | Status: DC

## 2023-06-05 ENCOUNTER — Other Ambulatory Visit: Payer: Self-pay | Admitting: Family Medicine

## 2023-06-07 ENCOUNTER — Ambulatory Visit
Admission: RE | Admit: 2023-06-07 | Discharge: 2023-06-07 | Disposition: A | Discharge status: Home / Self Care | Payer: PPO | Attending: Family Medicine | Admitting: Family Medicine

## 2023-06-07 ENCOUNTER — Ambulatory Visit
Admission: RE | Admit: 2023-06-07 | Discharge: 2023-06-07 | Disposition: A | Discharge status: Home / Self Care | Payer: PPO | Source: Ambulatory Visit | Attending: Family Medicine | Admitting: Family Medicine

## 2023-06-07 ENCOUNTER — Ambulatory Visit (INDEPENDENT_AMBULATORY_CARE_PROVIDER_SITE_OTHER): Payer: PPO | Admitting: Family Medicine

## 2023-06-07 ENCOUNTER — Encounter: Payer: Self-pay | Admitting: Family Medicine

## 2023-06-07 VITALS — BP 132/84 | HR 74 | Temp 97.9°F | Resp 16 | Ht 65.0 in | Wt 185.6 lb

## 2023-06-07 DIAGNOSIS — M5030 Other cervical disc degeneration, unspecified cervical region: Secondary | ICD-10-CM | POA: Diagnosis present

## 2023-06-07 DIAGNOSIS — M542 Cervicalgia: Secondary | ICD-10-CM | POA: Diagnosis present

## 2023-06-07 DIAGNOSIS — M25511 Pain in right shoulder: Secondary | ICD-10-CM | POA: Diagnosis present

## 2023-06-07 DIAGNOSIS — M5410 Radiculopathy, site unspecified: Secondary | ICD-10-CM | POA: Diagnosis present

## 2023-06-07 DIAGNOSIS — M62838 Other muscle spasm: Secondary | ICD-10-CM | POA: Diagnosis present

## 2023-06-07 MED ORDER — PREDNISONE 20 MG PO TABS: ORAL_TABLET | ORAL | 0 refills | Status: DC

## 2023-06-07 MED ORDER — BACLOFEN 10 MG PO TABS: 5.0000 mg | ORAL_TABLET | Freq: Three times a day (TID) | ORAL | 1 refills | Status: DC | PRN

## 2023-06-07 NOTE — Progress Notes (Signed)
Patient ID: Dawn Fuentes, female    DOB: December 10, 1946, 77 y.o.   MRN: [id number]  PCP: Alba Cory, MD  Chief Complaint  Patient presents with   Shoulder Pain   Neck Pain    Right side, can radiate down to r arm or in the head onset for a while. Pt denies injury    Subjective:   Dawn Fuentes is a 77 y.o. female, presents to clinic with CC of the following:  Pain to neck that radiates to shoulder and arm and back of neck/head already on baclofen and tylenol onset of sx over a few months ago, feels tight in right neck and upper trapezius area with radiation to neck and through shoulder and arm, limited ROM, she cannot sleep on her right side/shoulder due to pain.    Shoulder Pain  The pain is present in the right shoulder and neck. This is a new problem. The current episode started more than 1 month ago. There has been no history of extremity trauma. The problem occurs constantly. The problem has been unchanged. Pertinent negatives include no fever, inability to bear weight, itching, joint locking, joint swelling, limited range of motion, numbness, stiffness or tingling. Exacerbated by: laying on right side or shoulder. She has tried acetaminophen (muscle relaxers) for the symptoms. The treatment provided no relief. Her past medical history is significant for osteoarthritis.  Neck Pain  This is a new problem. The current episode started more than 1 month ago. The problem occurs constantly. The problem has been gradually worsening. The pain is associated with nothing. The pain is present in the right side (right posterior neck, upper shoulder/trapezius area). The pain is moderate. Exacerbated by: movement. The pain is Same all the time. Associated symptoms include headaches (triggering pain/HA to right occiput). Pertinent negatives include no chest pain, fever, leg pain, numbness, pain with swallowing, paresis, photophobia, syncope, tingling, trouble swallowing, visual change, weakness  or weight loss. She has tried acetaminophen and muscle relaxants for the symptoms. The treatment provided no relief.      Patient Active Problem List   Diagnosis Date Noted   Lichen simplex 03/08/2022   Chronic nonintractable headache 07/06/2021   Cervicogenic migraine 01/14/2021   Cervical facet joint syndrome 01/14/2021   Cervical radiculitis 12/01/2020   Bilateral hip pain 12/01/2020   Chronic bilateral low back pain without sciatica 12/01/2020   Chronic SI joint pain 12/01/2020   Chronic pain syndrome 12/01/2020   Body mass index (BMI) 30.0-30.9, adult 11/18/2020   Essential (primary) hypertension 11/18/2020   Cervical radicular pain 11/18/2020   Degeneration of cervical intervertebral disc 06/25/2020   Lichen sclerosus et atrophicus of the vulva 11/04/2019   [name] deficiency 12/26/2018   Osteoarthritis of knees, bilateral 05/26/2017   Primary open angle glaucoma of both eyes 11/21/2016   Chronic kidney disease (CKD), stage III (moderate) (HCC) 04/06/2016   Cystitis 10/12/2015   Benign hypertension 08/21/2015   Insomnia, persistent 08/21/2015   Dyslipidemia 08/21/2015   Gastro-esophageal reflux disease without esophagitis 08/21/2015   Glaucoma 08/21/2015   Blood glucose elevated 08/21/2015   Adult hypothyroidism 08/21/2015   Climacteric 08/21/2015   Senile osteoporosis 08/21/2015   Seasonal allergies 08/21/2015   Female genuine stress incontinence 08/21/2015   Vitamin D deficiency 08/21/2015      Current Outpatient Medications:    ALPRAZolam (XANAX) 0.25 MG tablet, Take 1 tablet (0.25 mg total) by mouth daily as needed for anxiety. Rarely, Disp: 30 tablet, Rfl: 0   baclofen (  LIORESAL) 10 MG tablet, Take 1 tablet (10 mg total) by mouth 3 (three) times daily., Disp: 30 each, Rfl: 1   Calcium Carb-Cholecalciferol 600-800 MG-UNIT TABS, Take 1 tablet by mouth daily., Disp: , Rfl:    citalopram (CELEXA) 20 MG tablet, Take 1 tablet (20 mg total) by mouth daily., Disp: 90  tablet, Rfl: 1   clobetasol ointment (TEMOVATE) 0.05 %, Apply 1 application. topically 2 (two) times a week., Disp: 30 g, Rfl: 2   denosumab (PROLIA) 60 MG/ML SOSY injection, Inject 60 mg into the skin every 6 (six) months., Disp: , Rfl:    levocetirizine (XYZAL) 5 MG tablet, Take 1 tablet (5 mg total) by mouth every evening., Disp: 90 tablet, Rfl: 1   levothyroxine (SYNTHROID) 75 MCG tablet, Take 1 tablet (75 mcg total) by mouth daily before breakfast. Except for Sunday take half pill, Disp: 90 tablet, Rfl: 0   losartan-hydrochlorothiazide (HYZAAR) 50-12.5 MG tablet, Take 1 tablet by mouth daily., Disp: 90 tablet, Rfl: 0   Omega-3 Fatty Acids (FISH OIL) 1000 MG CAPS, Take 1,000 mg by mouth daily., Disp: , Rfl:    pantoprazole (PROTONIX) 40 MG tablet, Take 1 tablet (40 mg total) by mouth daily., Disp: 90 tablet, Rfl: 1   propranolol ER (INDERAL LA) 60 MG 24 hr capsule, Take 60 mg by mouth daily., Disp: , Rfl:    simvastatin (ZOCOR) 40 MG tablet, Take 1 tablet (40 mg total) by mouth every evening., Disp: 90 tablet, Rfl: 1   timolol (TIMOPTIC) 0.5 % ophthalmic solution, Place 1 drop into both eyes 2 (two) times daily., Disp: , Rfl: 5   Turmeric 500 MG TABS, Take 500 mg by mouth in the morning and at bedtime., Disp: , Rfl:    VYZULTA 0.024 % SOLN, Place 1 drop into both eyes at bedtime., Disp: , Rfl: 2   zolpidem (AMBIEN) 5 MG tablet, Take 1 tablet (5 mg total) by mouth at bedtime., Disp: 90 tablet, Rfl: 1   Allergies  Allergen Reactions   Brimonidine Tartrate Other (See Comments)    Burning in eyes   Latanoprost Other (See Comments)    burning   Meloxicam     Increased blood pressure    Raloxifene     bone pain     Social History   Tobacco Use   Smoking status: Never   Smokeless tobacco: Never   Tobacco comments:    smoking cessation materials not required  Vaping Use   Vaping Use: Never used  Substance Use Topics   Alcohol use: No    Alcohol/week: 0.0 standard drinks of  alcohol   Drug use: No      Chart Review Today: I personally reviewed active problem list, medication list, allergies, family history, social history, health maintenance, notes from last encounter, lab results, imaging with the patient/caregiver today.   Review of Systems  Constitutional: Negative.  Negative for fever and weight loss.  HENT: Negative.  Negative for trouble swallowing.   Eyes: Negative.  Negative for photophobia.  Respiratory: Negative.    Cardiovascular: Negative.  Negative for chest pain and syncope.  Gastrointestinal: Negative.   Endocrine: Negative.   Genitourinary: Negative.   Musculoskeletal:  Positive for neck pain. Negative for stiffness.  Skin: Negative.  Negative for itching.  Allergic/Immunologic: Negative.   Neurological:  Positive for headaches (triggering pain/HA to right occiput). Negative for tingling, weakness and numbness.  Hematological: Negative.   Psychiatric/Behavioral: Negative.    All other systems reviewed and are negative.  Objective:   Vitals:   06/07/23 1414  BP: 132/84  Pulse: 74  Resp: 16  Temp: 97.9 F (36.6 C)  TempSrc: Oral  SpO2: 95%  Weight: 185 lb 9.6 oz (84.2 kg)  Height: 5\' 5"  (1.651 m)    Body mass index is 30.89 kg/m.  Physical Exam Vitals and nursing note reviewed.  Constitutional:      Appearance: She is well-developed.  HENT:     Head: Normocephalic and atraumatic.     Nose: Nose normal.  Eyes:     General:        Right eye: No discharge.        Left eye: No discharge.     Conjunctiva/sclera: Conjunctivae normal.  Neck:     Trachea: Trachea and phonation normal. No tracheal deviation.  Cardiovascular:     Rate and Rhythm: Normal rate and regular rhythm.  Pulmonary:     Effort: Pulmonary effort is normal. No respiratory distress.     Breath sounds: No stridor.  Musculoskeletal:     Right shoulder: Tenderness (trapezius area increased muscle tension with ttp) and crepitus (nonpainful popping  with ROM testing) present. No swelling, effusion or bony tenderness. Normal range of motion. Normal strength. Normal pulse.     Cervical back: Tenderness (right cervical paraspinal muscle ttp) present. No swelling, edema, deformity, erythema, rigidity, spasms, torticollis or crepitus. Pain with movement and muscular tenderness present. No spinous process tenderness. Decreased range of motion.  Skin:    General: Skin is warm and dry.     Findings: No rash.  Neurological:     Mental Status: She is alert.     Motor: No abnormal muscle tone.     Coordination: Coordination normal.  Psychiatric:        Behavior: Behavior normal.      Results for orders placed or performed in visit on 05/30/23  TSH  Result Value Ref Range   TSH 0.93 0.40 - 4.50 mIU/L       Assessment & Plan:   1. Neck pain Hx of spine arthritis, cervical DDD, and hx of migraines, pain in neck insidious onset several months ago has been constant and slowly gradually worsening.  She has increased muscle tension to her right upper trapezius right cervical paraspinal area and the pain seems to radiate through her shoulder and down her arm she does have pain when doing neck extension and flexion and she has limited rotation to the right and normal range of motion with rotation to the left She does have prior x-rays and MRI of her cervical spine more than 3 years ago, recheck neck x-ray due to months of pain just to compare to her prior films Have strongly recommended physical therapy .  She is unable to do NSAIDs Recommend she do a steroid burst if she has increased severity of radicular symptoms including shooting burning pain or numbness and tingling -right now it does not seem to be as much of a nerve pain as it feels like increased muscle tension and swelling which physical therapy and heat therapy would be very helpful to improve Encouraged her to follow-up in 1 to 2 months if no improvement with physical therapy or she can  also call us if she would like a referral to a orthopedic or spine specialist Her neck pain is also triggering slightly worse headaches than her baseline and she does see Dr. Sherryll Burger for this and has an upcoming appointment -I encouraged her to also speak to  Dr. Sherryll Burger about her symptoms -she is currently on propranolol and muscle relaxers for her headaches/migraines I explained that additional medication such as nortriptyline are sometimes very helpful for neck pain that triggers headaches - DG Cervical Spine Complete - Ambulatory referral to Physical Therapy  2. Radiculopathy, unspecified spinal region - DG Cervical Spine Complete - Ambulatory referral to Physical Therapy  3. Muscle spasms of neck Recommended heat therapy and PT consult - baclofen (LIORESAL) 10 MG tablet; Take 0.5-1 tablets (5-10 mg total) by mouth 3 (three) times daily as needed for muscle spasms.  Dispense: 60 each; Refill: 1 - DG Cervical Spine Complete - Ambulatory referral to Physical Therapy  4. DDD (degenerative disc disease), cervical Known prior arthritic changes and degenerative disc disease to cervical spine will recheck plain films - DG Cervical Spine Complete - Ambulatory referral to Physical Therapy  5. Right shoulder pain, unspecified chronicity She endorses right shoulder pain She had some upper trapezius tenderness to palpation and increased muscle tension but otherwise her shoulder exam was fairly unremarkable and she has good range of motion and no bony or joint tenderness to palpation Screening x-ray ordered I suspect that shoulder pain may be mostly referred from cervical spine - DG Shoulder Right   f/up in 1-2 months with PCP or myself for recheck if sx have not improved/resolved   Danelle Berry, PA-C 06/07/23 2:24 PM

## 2023-06-19 ENCOUNTER — Other Ambulatory Visit: Payer: Self-pay | Admitting: Family Medicine

## 2023-06-19 DIAGNOSIS — F411 Generalized anxiety disorder: Secondary | ICD-10-CM

## 2023-06-28 DIAGNOSIS — F458 Other somatoform disorders: Secondary | ICD-10-CM | POA: Diagnosis not present

## 2023-06-28 DIAGNOSIS — M542 Cervicalgia: Secondary | ICD-10-CM | POA: Diagnosis not present

## 2023-06-28 DIAGNOSIS — G44221 Chronic tension-type headache, intractable: Secondary | ICD-10-CM | POA: Diagnosis not present

## 2023-06-28 DIAGNOSIS — Z85818 Personal history of malignant neoplasm of other sites of lip, oral cavity, and pharynx: Secondary | ICD-10-CM | POA: Diagnosis not present

## 2023-06-28 DIAGNOSIS — M5481 Occipital neuralgia: Secondary | ICD-10-CM | POA: Diagnosis not present

## 2023-07-06 ENCOUNTER — Other Ambulatory Visit: Payer: Self-pay | Admitting: Neurology

## 2023-07-06 DIAGNOSIS — M542 Cervicalgia: Secondary | ICD-10-CM

## 2023-07-12 ENCOUNTER — Ambulatory Visit
Admission: RE | Admit: 2023-07-12 | Discharge: 2023-07-12 | Disposition: A | Discharge status: Home / Self Care | Payer: PPO | Source: Ambulatory Visit | Attending: Neurology | Admitting: Neurology

## 2023-07-12 DIAGNOSIS — M50222 Other cervical disc displacement at C5-C6 level: Secondary | ICD-10-CM | POA: Diagnosis not present

## 2023-07-12 DIAGNOSIS — M542 Cervicalgia: Secondary | ICD-10-CM | POA: Diagnosis present

## 2023-07-12 DIAGNOSIS — M5021 Other cervical disc displacement,  high cervical region: Secondary | ICD-10-CM | POA: Diagnosis not present

## 2023-07-12 DIAGNOSIS — M47812 Spondylosis without myelopathy or radiculopathy, cervical region: Secondary | ICD-10-CM | POA: Diagnosis not present

## 2023-07-12 DIAGNOSIS — M50223 Other cervical disc displacement at C6-C7 level: Secondary | ICD-10-CM | POA: Diagnosis not present

## 2023-07-13 DIAGNOSIS — M7918 Myalgia, other site: Secondary | ICD-10-CM | POA: Diagnosis not present

## 2023-07-13 DIAGNOSIS — M5481 Occipital neuralgia: Secondary | ICD-10-CM | POA: Diagnosis not present

## 2023-07-19 DIAGNOSIS — M810 Age-related osteoporosis without current pathological fracture: Secondary | ICD-10-CM | POA: Diagnosis not present

## 2023-08-01 DIAGNOSIS — M810 Age-related osteoporosis without current pathological fracture: Secondary | ICD-10-CM | POA: Diagnosis not present

## 2023-08-01 DIAGNOSIS — N1832 Chronic kidney disease, stage 3b: Secondary | ICD-10-CM | POA: Diagnosis not present

## 2023-08-10 DIAGNOSIS — H16122 Filamentary keratitis, left eye: Secondary | ICD-10-CM | POA: Diagnosis not present

## 2023-08-10 DIAGNOSIS — H401122 Primary open-angle glaucoma, left eye, moderate stage: Secondary | ICD-10-CM | POA: Diagnosis not present

## 2023-08-10 DIAGNOSIS — H402213 Chronic angle-closure glaucoma, right eye, severe stage: Secondary | ICD-10-CM | POA: Diagnosis not present

## 2023-08-17 DIAGNOSIS — H402213 Chronic angle-closure glaucoma, right eye, severe stage: Secondary | ICD-10-CM | POA: Diagnosis not present

## 2023-08-17 DIAGNOSIS — H16122 Filamentary keratitis, left eye: Secondary | ICD-10-CM | POA: Diagnosis not present

## 2023-09-06 NOTE — Progress Notes (Unsigned)
Name: Dawn Fuentes   MRN: [id number]    DOB: 12-30-1945   Date:09/07/2023       Progress Note  Subjective  Chief Complaint  Follow Up  HPI  GAD: she was doing well on Citalopram, however neurologist switched her from citalopram to duloxetine 20 mg back in September but it caused increase in anxiety , so she is back on citalopram 20 mg since . She takes alprazolam prn now, however since July she has taken more than usual. Initially because of procedure done for occipital neuralgia caused increase in pain and anxiety followed by feeling more nervous in the afternoons lately. She has a trip scheduled to go to [place] next week. Discussed trying buspar up to TID and alprazolam only if panic attack, we will give her 10 pills instead of 30 this time to see how long it will last.   Hypothyroidism: last TSH was at goal, she is  taking levothyroxine as prescribed . Denies weight gain, dysphagia or hair loss   Skin bumps: on right elbow, going on for a while, no pain or itching   CKI: avoiding nsaid's  denies pruritis or decrease in urinary output, she is on ARB , GFR dropped a little during recent visit to Endo and we will recheck it today .    GERD: taking medication and symptoms are controlled  HTN: she is compliant with medication, denies chest pain or palpitation.   Headaches/Occipital neuralgia : under the care of neurologist, she is now taking propanolol 60 mg and states headache still most days of the week, taking tylenol prn   Post menopausal osteoporosis: still getting Prolia from Endo   [name] deficiency: she stopped getting injections since level was up but has not been taking SL dose a few times a week as recommended. We will give her an injections today , but explained otc is enough for her from now on    Patient Active Problem List   Diagnosis Date Noted   Occipital neuralgia of right side 09/07/2023   Perennial allergic rhinitis with seasonal variation 09/07/2023   Lichen simplex  03/08/2022   Chronic nonintractable headache 07/06/2021   Cervicogenic migraine 01/14/2021   Cervical facet joint syndrome 01/14/2021   Cervical radiculitis 12/01/2020   Bilateral hip pain 12/01/2020   Chronic bilateral low back pain without sciatica 12/01/2020   Chronic SI joint pain 12/01/2020   Chronic pain syndrome 12/01/2020   Body mass index (BMI) 30.0-30.9, adult 11/18/2020   Essential (primary) hypertension 11/18/2020   Cervical radicular pain 11/18/2020   Degeneration of cervical intervertebral disc 06/25/2020   Lichen sclerosus et atrophicus of the vulva 11/04/2019   Vitamin [name] deficiency 12/26/2018   Osteoarthritis of knees, bilateral 05/26/2017   Primary open angle glaucoma of both eyes 11/21/2016   Chronic kidney disease (CKD), stage III (moderate) (HCC) 04/06/2016   Cystitis 10/12/2015   GAD (generalized anxiety disorder) 08/21/2015   Benign hypertension 08/21/2015   Insomnia, persistent 08/21/2015   Dyslipidemia 08/21/2015   Gastro-esophageal reflux disease without esophagitis 08/21/2015   Glaucoma 08/21/2015   Blood glucose elevated 08/21/2015   Adult hypothyroidism 08/21/2015   Climacteric 08/21/2015   Senile osteoporosis 08/21/2015   Seasonal allergies 08/21/2015   Female genuine stress incontinence 08/21/2015   Vitamin D deficiency 08/21/2015    Past Surgical History:  Procedure Laterality Date   ABDOMINAL HYSTERECTOMY     21 years ago   APPENDECTOMY     COLONOSCOPY N/A 09/14/2020   Procedure: COLONOSCOPY;  Surgeon:  Regis Bill, MD;  Location: [facility] ENDOSCOPY;  Service: Endoscopy;  Laterality: N/A;   COLONOSCOPY WITH PROPOFOL N/A 05/29/2015   Procedure: COLONOSCOPY WITH PROPOFOL;  Surgeon: Wallace Cullens, MD;  Location: [facility] ENDOSCOPY;  Service: Gastroenterology;  Laterality: N/A;   ESOPHAGOGASTRODUODENOSCOPY N/A 05/29/2015   Procedure: ESOPHAGOGASTRODUODENOSCOPY (EGD);  Surgeon: Wallace Cullens, MD;  Location: [facility] ENDOSCOPY;  Service: Gastroenterology;   Laterality: N/A;   ESOPHAGOGASTRODUODENOSCOPY N/A 09/14/2020   Procedure: ESOPHAGOGASTRODUODENOSCOPY (EGD);  Surgeon: Regis Bill, MD;  Location: [facility] ENDOSCOPY;  Service: Endoscopy;  Laterality: N/A;   EYE SURGERY     eye lids   OVARIAN CYST REMOVAL     needed transfusion   PAROTIDECTOMY Right 05/05/2021   Procedure: SUPERFICIAL PAROTIDECTOMY;  Surgeon: Geanie Logan, MD;  Location: [facility] ORS;  Service: ENT;  Laterality: Right;   PHOTOCOAGULATION WITH LASER Right 11/21/2016   Procedure: PHOTOCOAGULATION WITH LASER;  Surgeon: Sherald Hess, MD;  Location: [facility];  Service: Ophthalmology;  Laterality: Right;  A micropulse probe was applied to each hemilimbus with the following settings: , 31.3% duty cycle, 90 seconds x 3 applications.    TONSILLECTOMY      Family History  Problem Relation Age of Onset   Hypertension Mother    Osteoporosis Mother    COPD Mother    Dementia Mother    Cancer Father        Colon   Hypothyroidism Daughter    Aneurysm Brother    COPD Sister     Social History   Tobacco Use   Smoking status: Never   Smokeless tobacco: Never   Tobacco comments:    smoking cessation materials not required  Substance Use Topics   Alcohol use: No    Alcohol/week: 0.0 standard drinks of alcohol     Current Outpatient Medications:    baclofen (LIORESAL) 10 MG tablet, Take 0.5-1 tablets (5-10 mg total) by mouth 3 (three) times daily as needed for muscle spasms., Disp: 60 each, Rfl: 1   busPIRone (BUSPAR) 5 MG tablet, Take 1 tablet (5 mg total) by mouth 3 (three) times daily as needed., Disp: 90 tablet, Rfl: 0   Calcium Carb-Cholecalciferol 600-800 MG-UNIT TABS, Take 1 tablet by mouth daily., Disp: , Rfl:    clobetasol ointment (TEMOVATE) 0.05 %, Apply 1 application. topically 2 (two) times a week., Disp: 30 g, Rfl: 2   denosumab (PROLIA) 60 MG/ML SOSY injection, Inject 60 mg into the skin every 6 (six) months., Disp: , Rfl:     propranolol ER (INDERAL LA) 60 MG 24 hr capsule, Take 60 mg by mouth daily., Disp: , Rfl:    timolol (TIMOPTIC) 0.5 % ophthalmic solution, Place 1 drop into both eyes 2 (two) times daily., Disp: , Rfl: 5   Turmeric 500 MG TABS, Take 500 mg by mouth in the morning and at bedtime., Disp: , Rfl:    VYZULTA 0.024 % SOLN, Place 1 drop into both eyes at bedtime., Disp: , Rfl: 2   ALPRAZolam (XANAX) 0.25 MG tablet, Take 1 tablet (0.25 mg total) by mouth daily as needed for anxiety. Rarely, Disp: 10 tablet, Rfl: 0   citalopram (CELEXA) 20 MG tablet, Take 1 tablet (20 mg total) by mouth daily., Disp: 90 tablet, Rfl: 1   levocetirizine (XYZAL) 5 MG tablet, Take 1 tablet (5 mg total) by mouth every evening., Disp: 90 tablet, Rfl: 1   levothyroxine (SYNTHROID) 75 MCG tablet, Take 1 tablet (75 mcg total) by mouth daily before breakfast.  Except for Sunday take half pill, Disp: 90 tablet, Rfl: 0   losartan-hydrochlorothiazide (HYZAAR) 50-12.5 MG tablet, Take 1 tablet by mouth daily., Disp: 90 tablet, Rfl: 1   pantoprazole (PROTONIX) 40 MG tablet, Take 1 tablet (40 mg total) by mouth daily., Disp: 90 tablet, Rfl: 1   simvastatin (ZOCOR) 40 MG tablet, Take 1 tablet (40 mg total) by mouth every evening., Disp: 90 tablet, Rfl: 1   zolpidem (AMBIEN) 5 MG tablet, Take 1 tablet (5 mg total) by mouth at bedtime., Disp: 90 tablet, Rfl: 1  Allergies  Allergen Reactions   Brimonidine Tartrate Other (See Comments)    Burning in eyes   Latanoprost Other (See Comments)    burning   Meloxicam     Increased blood pressure    Raloxifene     bone pain    I personally reviewed active problem list, medication list, allergies, family history, social history, health maintenance with the patient/caregiver today.   ROS  Ten systems reviewed and is negative except as mentioned in HPI    Objective  Vitals:   09/07/23 1134  BP: 128/82  Pulse: 70  Resp: 14  Temp: 97.9 F (36.6 C)  TempSrc: Oral  SpO2: 98%  Weight:  185 lb 4.8 oz (84.1 kg)  Height: 5\' 5"  (1.651 m)    Body mass index is 30.84 kg/m.  Physical Exam  Constitutional: Patient appears well-developed and well-nourished. Obese  No distress.  HEENT: head atraumatic, normocephalic, pupils equal and reactive to light, neck supple Cardiovascular: Normal rate, regular rhythm and normal heart sounds.  No murmur heard. No BLE edema. Pulmonary/Chest: Effort normal and breath sounds normal. No respiratory distress. Abdominal: Soft.  There is no tenderness. Psychiatric: Patient has a normal mood and affect. behavior is normal. Judgment and thought content normal.    PHQ2/9:    09/07/2023   11:36 AM 06/07/2023    2:14 PM 05/18/2023   10:26 AM 03/07/2023    1:14 PM 10/12/2022    3:52 PM  Depression screen PHQ 2/9  Decreased Interest 0 0 0 0 0  Down, Depressed, Hopeless 0 0 0 0 0  PHQ - 2 Score 0 0 0 0 0  Altered sleeping 1 0  0 1  Tired, decreased energy 0 0  0 1  Change in appetite 0 0  0 0  Feeling bad or failure about yourself  0 0  0 0  Trouble concentrating 0 0  0 0  Moving slowly or fidgety/restless 0 0  0 0  Suicidal thoughts 0 0  0 0  PHQ-9 Score 1 0  0 2  Difficult doing work/chores Not difficult at all Not difficult at all   Not difficult at all    phq 9 is negative   Fall Risk:    09/07/2023   11:36 AM 06/07/2023    2:14 PM 05/14/2023   11:08 AM 03/07/2023    1:14 PM 10/12/2022    3:50 PM  Fall Risk   Falls in the past year? 0 0 0 0 0  Number falls in past yr:  0  0   Injury with Fall?  0  0   Risk for fall due to : No Fall Risks No Fall Risks  No Fall Risks No Fall Risks  Follow up Falls prevention discussed Falls prevention discussed;Education provided;Falls evaluation completed  Falls prevention discussed Falls prevention discussed;Education provided;Falls evaluation completed      Functional Status Survey: Is the patient deaf or  have difficulty hearing?: No Does the patient have difficulty seeing, even when  wearing glasses/contacts?: No Does the patient have difficulty concentrating, remembering, or making decisions?: No Does the patient have difficulty walking or climbing stairs?: No Does the patient have difficulty dressing or bathing?: No Does the patient have difficulty doing errands alone such as visiting a doctor's office or shopping?: No    Assessment & Plan  1. Stage 3a chronic kidney disease (HCC)  - losartan-hydrochlorothiazide (HYZAAR) 50-12.5 MG tablet; Take 1 tablet by mouth daily.  Dispense: 90 tablet; Refill: 1  2. Vitamin [name] deficiency  - cyanocobalamin (VITAMIN [name]) injection 1,000 mcg  3. GAD (generalized anxiety disorder)  - citalopram (CELEXA) 20 MG tablet; Take 1 tablet (20 mg total) by mouth daily.  Dispense: 90 tablet; Refill: 1 - ALPRAZolam (XANAX) 0.25 MG tablet; Take 1 tablet (0.25 mg total) by mouth daily as needed for anxiety. Rarely  Dispense: 10 tablet; Refill: 0  4. Gastro-esophageal reflux disease without esophagitis  - pantoprazole (PROTONIX) 40 MG tablet; Take 1 tablet (40 mg total) by mouth daily.  Dispense: 90 tablet; Refill: 1  5. Adult hypothyroidism  - TSH - levothyroxine (SYNTHROID) 75 MCG tablet; Take 1 tablet (75 mcg total) by mouth daily before breakfast. Except for Sunday take half pill  Dispense: 90 tablet; Refill: 0  6. Breast cancer screening by mammogram  - MM 3D SCREENING MAMMOGRAM BILATERAL BREAST; Future  7. Benign hypertension  - BASIC METABOLIC PANEL WITH GFR - losartan-hydrochlorothiazide (HYZAAR) 50-12.5 MG tablet; Take 1 tablet by mouth daily.  Dispense: 90 tablet; Refill: 1  8. Insomnia, persistent  - zolpidem (AMBIEN) 5 MG tablet; Take 1 tablet (5 mg total) by mouth at bedtime.  Dispense: 90 tablet; Refill: 1  9. Hyperglycemia  - Hemoglobin A1c  10. Dyslipidemia  - simvastatin (ZOCOR) 40 MG tablet; Take 1 tablet (40 mg total) by mouth every evening.  Dispense: 90 tablet; Refill: 1  11. Perennial allergic  rhinitis with seasonal variation  - levocetirizine (XYZAL) 5 MG tablet; Take 1 tablet (5 mg total) by mouth every evening.  Dispense: 90 tablet; Refill: 1  12. Other viral warts  She will try otc medication and if no improvement she will come in for treatment

## 2023-09-07 ENCOUNTER — Ambulatory Visit (INDEPENDENT_AMBULATORY_CARE_PROVIDER_SITE_OTHER): Payer: PPO | Admitting: Family Medicine

## 2023-09-07 ENCOUNTER — Encounter: Payer: Self-pay | Admitting: Family Medicine

## 2023-09-07 VITALS — BP 128/82 | HR 70 | Temp 97.9°F | Resp 14 | Ht 65.0 in | Wt 185.3 lb

## 2023-09-07 DIAGNOSIS — F411 Generalized anxiety disorder: Secondary | ICD-10-CM | POA: Diagnosis not present

## 2023-09-07 DIAGNOSIS — E785 Hyperlipidemia, unspecified: Secondary | ICD-10-CM

## 2023-09-07 DIAGNOSIS — J3089 Other allergic rhinitis: Secondary | ICD-10-CM

## 2023-09-07 DIAGNOSIS — I10 Essential (primary) hypertension: Secondary | ICD-10-CM

## 2023-09-07 DIAGNOSIS — Z23 Encounter for immunization: Secondary | ICD-10-CM

## 2023-09-07 DIAGNOSIS — G4700 Insomnia, unspecified: Secondary | ICD-10-CM

## 2023-09-07 DIAGNOSIS — Z1231 Encounter for screening mammogram for malignant neoplasm of breast: Secondary | ICD-10-CM

## 2023-09-07 DIAGNOSIS — J302 Other seasonal allergic rhinitis: Secondary | ICD-10-CM | POA: Diagnosis not present

## 2023-09-07 DIAGNOSIS — B078 Other viral warts: Secondary | ICD-10-CM | POA: Diagnosis not present

## 2023-09-07 DIAGNOSIS — E538 Deficiency of other specified B group vitamins: Secondary | ICD-10-CM | POA: Diagnosis not present

## 2023-09-07 DIAGNOSIS — R739 Hyperglycemia, unspecified: Secondary | ICD-10-CM

## 2023-09-07 DIAGNOSIS — K219 Gastro-esophageal reflux disease without esophagitis: Secondary | ICD-10-CM | POA: Diagnosis not present

## 2023-09-07 DIAGNOSIS — E039 Hypothyroidism, unspecified: Secondary | ICD-10-CM | POA: Diagnosis not present

## 2023-09-07 DIAGNOSIS — N1831 Chronic kidney disease, stage 3a: Secondary | ICD-10-CM | POA: Diagnosis not present

## 2023-09-07 DIAGNOSIS — M5481 Occipital neuralgia: Secondary | ICD-10-CM | POA: Insufficient documentation

## 2023-09-07 MED ORDER — LEVOTHYROXINE SODIUM 75 MCG PO TABS: 75.0000 ug | ORAL_TABLET | Freq: Every day | ORAL | 0 refills | Status: DC

## 2023-09-07 MED ORDER — ZOLPIDEM TARTRATE 5 MG PO TABS: 5.0000 mg | ORAL_TABLET | Freq: Every day | ORAL | 1 refills | Status: DC

## 2023-09-07 MED ORDER — LOSARTAN POTASSIUM-HCTZ 50-12.5 MG PO TABS: 1.0000 | ORAL_TABLET | Freq: Every day | ORAL | 1 refills | Status: DC

## 2023-09-07 MED ORDER — PANTOPRAZOLE SODIUM 40 MG PO TBEC: 40.0000 mg | DELAYED_RELEASE_TABLET | Freq: Every day | ORAL | 1 refills | Status: DC

## 2023-09-07 MED ORDER — SIMVASTATIN 40 MG PO TABS: 40.0000 mg | ORAL_TABLET | Freq: Every evening | ORAL | 1 refills | Status: DC

## 2023-09-07 MED ORDER — ALPRAZOLAM 0.25 MG PO TABS: 0.2500 mg | ORAL_TABLET | Freq: Every day | ORAL | 0 refills | Status: DC | PRN

## 2023-09-07 MED ORDER — BUSPIRONE HCL 5 MG PO TABS: 5.0000 mg | ORAL_TABLET | Freq: Three times a day (TID) | ORAL | 0 refills | Status: DC | PRN

## 2023-09-07 MED ORDER — CITALOPRAM HYDROBROMIDE 20 MG PO TABS: 20.0000 mg | ORAL_TABLET | Freq: Every day | ORAL | 1 refills | Status: DC

## 2023-09-07 MED ORDER — CYANOCOBALAMIN 1000 MCG/ML IJ SOLN
1000.0000 ug | Freq: Once | INTRAMUSCULAR | Status: AC
  Administered 2023-09-07: 1000 ug via INTRAMUSCULAR

## 2023-09-07 MED ORDER — LEVOCETIRIZINE DIHYDROCHLORIDE 5 MG PO TABS: 5.0000 mg | ORAL_TABLET | Freq: Every evening | ORAL | 1 refills | Status: DC

## 2023-09-08 ENCOUNTER — Ambulatory Visit: Payer: PPO | Admitting: Family Medicine

## 2023-09-29 ENCOUNTER — Ambulatory Visit (INDEPENDENT_AMBULATORY_CARE_PROVIDER_SITE_OTHER): Payer: PPO

## 2023-09-29 DIAGNOSIS — Z23 Encounter for immunization: Secondary | ICD-10-CM | POA: Diagnosis not present

## 2023-09-29 DIAGNOSIS — E039 Hypothyroidism, unspecified: Secondary | ICD-10-CM | POA: Diagnosis not present

## 2023-09-29 DIAGNOSIS — R739 Hyperglycemia, unspecified: Secondary | ICD-10-CM | POA: Diagnosis not present

## 2023-09-29 DIAGNOSIS — I10 Essential (primary) hypertension: Secondary | ICD-10-CM | POA: Diagnosis not present

## 2023-09-30 LAB — BASIC METABOLIC PANEL WITHOUT GFR
BUN/Creatinine Ratio: 15 (calc) (ref 6–22)
BUN: 19 mg/dL (ref 7–25)
CO2: 28 mmol/L (ref 20–32)
Calcium: 10.1 mg/dL (ref 8.6–10.4)
Chloride: 102 mmol/L (ref 98–110)
Creat: 1.27 mg/dL — ABNORMAL HIGH (ref 0.60–1.00)
Glucose, Bld: 127 mg/dL — ABNORMAL HIGH (ref 65–99)
Potassium: 4.3 mmol/L (ref 3.5–5.3)
Sodium: 138 mmol/L (ref 135–146)
eGFR: 44 mL/min/{1.73_m2} — ABNORMAL LOW

## 2023-09-30 LAB — HEMOGLOBIN A1C
Hgb A1c MFr Bld: 6.6 %{Hb} — ABNORMAL HIGH
Mean Plasma Glucose: 143 mg/dL
eAG (mmol/L): 7.9 mmol/L

## 2023-09-30 LAB — TSH: TSH: 2.67 m[IU]/L (ref 0.40–4.50)

## 2023-10-02 ENCOUNTER — Telehealth: Payer: Self-pay

## 2023-10-02 ENCOUNTER — Telehealth: Payer: Self-pay | Admitting: Family Medicine

## 2023-10-02 DIAGNOSIS — Z1231 Encounter for screening mammogram for malignant neoplasm of breast: Secondary | ICD-10-CM | POA: Diagnosis not present

## 2023-10-02 LAB — HM MAMMOGRAPHY

## 2023-10-02 NOTE — Telephone Encounter (Signed)
The patient just called me and said that you (sowles) has sent her a message that her diab numbers are creeping up and you want to see her sooner than her appt already. Please advise where I can put her so I can call her back with this appt.

## 2023-10-02 NOTE — Telephone Encounter (Signed)
Spoke with patient and reassured her per Dr. Carlynn Purl to be mindful of sugary intake and food choices as her A1C shows she is in diabetes range at 6.6. Patient expressed over the last few months she has been careless with her diet as she has been on vacations and has been eating more candy than normal. Patient agreed to make more mindful dietary choices and was comfortable with upcoming appointment set for November.

## 2023-10-05 DIAGNOSIS — H401122 Primary open-angle glaucoma, left eye, moderate stage: Secondary | ICD-10-CM | POA: Diagnosis not present

## 2023-10-05 DIAGNOSIS — H2513 Age-related nuclear cataract, bilateral: Secondary | ICD-10-CM | POA: Diagnosis not present

## 2023-10-05 DIAGNOSIS — H402213 Chronic angle-closure glaucoma, right eye, severe stage: Secondary | ICD-10-CM | POA: Diagnosis not present

## 2023-10-05 DIAGNOSIS — H16122 Filamentary keratitis, left eye: Secondary | ICD-10-CM | POA: Diagnosis not present

## 2023-11-01 NOTE — Progress Notes (Signed)
 Name: Dawn Fuentes   MRN: [id number]    DOB: 06-Dec-1946   Date:11/02/2023       Progress Note  Subjective  Chief Complaint  Discuss DM  HPI  New Onset DM: diagnosed based on labs done October 2024 - A1C of 6.6 % and fasting glucose of 127. She denies polyphagia, polydipsia or polyuria. She has been craving sweets either candy or chocolate at night. She has decreased intake of sweets and stopped drinking sweet tea since diagnosis. Discussed importance of yearly eye exam, keep A1C below 7 %, LDL below 70. Keep BP under control . She is willing to see dietician  Hypothyroidism: doing well on current regiment. We will send a refill  Anxiety: she states buspar  has improved symptoms, no longer having to take alprazolam  often, takes buspar  bid now and would like a refill  Patient Active Problem List   Diagnosis Date Noted   Diabetes mellitus, new onset (HCC) 11/02/2023   Occipital neuralgia of right side 09/07/2023   Perennial allergic rhinitis with seasonal variation 09/07/2023   Lichen simplex 03/08/2022   Chronic nonintractable headache 07/06/2021   Cervicogenic migraine 01/14/2021   Cervical facet joint syndrome 01/14/2021   Cervical radiculitis 12/01/2020   Bilateral hip pain 12/01/2020   Chronic bilateral low back pain without sciatica 12/01/2020   Chronic SI joint pain 12/01/2020   Chronic pain syndrome 12/01/2020   Body mass index (BMI) 30.0-30.9, adult 11/18/2020   Essential (primary) hypertension 11/18/2020   Cervical radicular pain 11/18/2020   Degeneration of cervical intervertebral disc 06/25/2020   Lichen sclerosus et atrophicus of the vulva 11/04/2019   Vitamin [name] deficiency 12/26/2018   Osteoarthritis of knees, bilateral 05/26/2017   Primary open angle glaucoma of both eyes 11/21/2016   Chronic kidney disease (CKD), stage III (moderate) (HCC) 04/06/2016   Cystitis 10/12/2015   GAD (generalized anxiety disorder) 08/21/2015   Benign hypertension 08/21/2015    Insomnia, persistent 08/21/2015   Dyslipidemia 08/21/2015   Gastro-esophageal reflux disease without esophagitis 08/21/2015   Glaucoma 08/21/2015   Blood glucose elevated 08/21/2015   Adult hypothyroidism 08/21/2015   Climacteric 08/21/2015   Senile osteoporosis 08/21/2015   Seasonal allergies 08/21/2015   Female genuine stress incontinence 08/21/2015   Vitamin D  deficiency 08/21/2015    Past Surgical History:  Procedure Laterality Date   ABDOMINAL HYSTERECTOMY     21 years ago   APPENDECTOMY     COLONOSCOPY N/A 09/14/2020   Procedure: COLONOSCOPY;  Surgeon: Maryruth Ole DASEN, MD;  Location: [facility] ENDOSCOPY;  Service: Endoscopy;  Laterality: N/A;   COLONOSCOPY WITH PROPOFOL  N/A 05/29/2015   Procedure: COLONOSCOPY WITH PROPOFOL ;  Surgeon: Deward CINDERELLA Piedmont, MD;  Location[facility];  Service: Gastroenterology;  Laterality: N/A;   ESOPHAGOGASTRODUODENOSCOPY N/A 05/29/2015   Procedure: ESOPHAGOGASTRODUODENOSCOPY (EGD);  Surgeon: Deward CINDERELLA Piedmont, MD;  Location: [facility] ENDOSCOPY;  Service: Gastroenterology;  Laterality: N/A;   ESOPHAGOGASTRODUODENOSCOPY N/A 09/14/2020   Procedure: ESOPHAGOGASTRODUODENOSCOPY (EGD);  Surgeon: Maryruth Ole DASEN, MD;  Location: [facility] ENDOSCOPY;  Service: Endoscopy;  Laterality: N/A;   EYE SURGERY     eye lids   OVARIAN CYST REMOVAL     needed transfusion   PAROTIDECTOMY Right 05/05/2021   Procedure: SUPERFICIAL PAROTIDECTOMY;  Surgeon: Blair Deward, MD;  Location: [facility] ORS;  Service: ENT;  Laterality: Right;   PHOTOCOAGULATION WITH LASER Right 11/21/2016   Procedure: PHOTOCOAGULATION WITH LASER;  Surgeon: Donzell Arlyce Budd, MD;  Location: [facility];  Service: Ophthalmology;  Laterality: Right;  A micropulse probe was  applied to each hemilimbus with the following settings: , 31.3% duty cycle, 90 seconds x 3 applications.    TONSILLECTOMY      Family History  Problem Relation Age of Onset   Hypertension Mother    Osteoporosis Mother    COPD  Mother    Dementia Mother    Cancer Father        Colon   Hypothyroidism Daughter    Aneurysm Brother    COPD Sister     Social History   Tobacco Use   Smoking status: Never   Smokeless tobacco: Never   Tobacco comments:    smoking cessation materials not required  Substance Use Topics   Alcohol use: No    Alcohol/week: 0.0 standard drinks of alcohol     Current Outpatient Medications:    ALPRAZolam  (XANAX ) 0.25 MG tablet, Take 1 tablet (0.25 mg total) by mouth daily as needed for anxiety. Rarely, Disp: 10 tablet, Rfl: 0   Calcium Carb-Cholecalciferol 600-800 MG-UNIT TABS, Take 1 tablet by mouth daily., Disp: , Rfl:    citalopram  (CELEXA ) 20 MG tablet, Take 1 tablet (20 mg total) by mouth daily., Disp: 90 tablet, Rfl: 1   clobetasol  ointment (TEMOVATE ) 0.05 %, Apply 1 application. topically 2 (two) times a week., Disp: 30 g, Rfl: 2   denosumab  (PROLIA ) 60 MG/ML SOSY injection, Inject 60 mg into the skin every 6 (six) months., Disp: , Rfl:    levocetirizine (XYZAL ) 5 MG tablet, Take 1 tablet (5 mg total) by mouth every evening., Disp: 90 tablet, Rfl: 1   losartan -hydrochlorothiazide  (HYZAAR) 50-12.5 MG tablet, Take 1 tablet by mouth daily., Disp: 90 tablet, Rfl: 1   pantoprazole  (PROTONIX ) 40 MG tablet, Take 1 tablet (40 mg total) by mouth daily., Disp: 90 tablet, Rfl: 1   propranolol ER (INDERAL LA) 80 MG 24 hr capsule, , Disp: , Rfl:    simvastatin  (ZOCOR ) 40 MG tablet, Take 1 tablet (40 mg total) by mouth every evening., Disp: 90 tablet, Rfl: 1   timolol (TIMOPTIC) 0.5 % ophthalmic solution, Place 1 drop into both eyes 2 (two) times daily., Disp: , Rfl: 5   Turmeric 500 MG TABS, Take 500 mg by mouth in the morning and at bedtime., Disp: , Rfl:    VYZULTA 0.024 % SOLN, Place 1 drop into both eyes at bedtime., Disp: , Rfl: 2   zolpidem  (AMBIEN ) 5 MG tablet, Take 1 tablet (5 mg total) by mouth at bedtime., Disp: 90 tablet, Rfl: 1   baclofen  (LIORESAL ) 10 MG tablet, Take 1 tablet  (10 mg total) by mouth 2 (two) times daily., Disp: 180 each, Rfl: 1   busPIRone  (BUSPAR ) 5 MG tablet, Take 1 tablet (5 mg total) by mouth 3 (three) times daily as needed., Disp: 180 tablet, Rfl: 1   levothyroxine  (SYNTHROID ) 75 MCG tablet, Take 1 tablet (75 mcg total) by mouth daily before breakfast. Except for Sunday take half pill, Disp: 90 tablet, Rfl: 0  Allergies  Allergen Reactions   Brimonidine Tartrate Other (See Comments)    Burning in eyes   Latanoprost Other (See Comments)    burning   Meloxicam      Increased blood pressure    Raloxifene     bone pain    I personally reviewed active problem list, medication list, allergies, family history, social history, health maintenance with the patient/caregiver today.   ROS  Ten systems reviewed and is negative except as mentioned in HPI    Objective  Vitals:  11/02/23 1125  BP: 122/80  Pulse: 75  Resp: 12  Temp: 97.7 F (36.5 C)  TempSrc: Oral  SpO2: 95%  Weight: 183 lb 9.6 oz (83.3 kg)  Height: 5' 5 (1.651 m)    Body mass index is 30.55 kg/m.  Physical Exam  Constitutional: Patient appears well-developed and well-nourished. Obese  No distress.  HEENT: head atraumatic, normocephalic, pupils equal and reactive to light, neck supple, throat within normal limits Cardiovascular: Normal rate, regular rhythm and normal heart sounds.  No murmur heard. No BLE edema. Pulmonary/Chest: Effort normal and breath sounds normal. No respiratory distress. Abdominal: Soft.  There is no tenderness. Psychiatric: Patient has a normal mood and affect. behavior is normal. Judgment and thought content normal.   Diabetic Foot Exam - Simple   Simple Foot Form Visual Inspection No deformities, no ulcerations, no other skin breakdown bilaterally: Yes Sensation Testing Intact to touch and monofilament testing bilaterally: Yes Pulse Check Posterior Tibialis and Dorsalis pulse intact bilaterally: Yes Comments     PHQ2/9:     11/02/2023   11:27 AM 09/07/2023   11:36 AM 06/07/2023    2:14 PM 05/18/2023   10:26 AM 03/07/2023    1:14 PM  Depression screen PHQ 2/9  Decreased Interest 0 0 0 0 0  Down, Depressed, Hopeless 0 0 0 0 0  PHQ - 2 Score 0 0 0 0 0  Altered sleeping 1 1 0  0  Tired, decreased energy 1 0 0  0  Change in appetite 0 0 0  0  Feeling bad or failure about yourself  0 0 0  0  Trouble concentrating 0 0 0  0  Moving slowly or fidgety/restless 0 0 0  0  Suicidal thoughts 0 0 0  0  PHQ-9 Score 2 1 0  0  Difficult doing work/chores Not difficult at all Not difficult at all Not difficult at all      phq 9 is negative   Assessment & Plan  1. Diabetes mellitus, new onset (HCC)  - Amb ref to Medical Nutrition Therapy-MNT  2. Vitamin [name] deficiency  - cyanocobalamin  (VITAMIN [name]) injection 1,000 mcg  3. Adult hypothyroidism  - levothyroxine  (SYNTHROID ) 75 MCG tablet; Take 1 tablet (75 mcg total) by mouth daily before breakfast. Except for Sunday take half pill  Dispense: 90 tablet; Refill: 0  4. GAD (generalized anxiety disorder)  - busPIRone  (BUSPAR ) 5 MG tablet; Take 1 tablet (5 mg total) by mouth 3 (three) times daily as needed.  Dispense: 180 tablet; Refill: 1

## 2023-11-02 ENCOUNTER — Encounter: Payer: Self-pay | Admitting: Family Medicine

## 2023-11-02 ENCOUNTER — Ambulatory Visit: Payer: PPO | Admitting: Family Medicine

## 2023-11-02 VITALS — BP 122/80 | HR 75 | Temp 97.7°F | Resp 12 | Ht 65.0 in | Wt 183.6 lb

## 2023-11-02 DIAGNOSIS — E538 Deficiency of other specified B group vitamins: Secondary | ICD-10-CM | POA: Diagnosis not present

## 2023-11-02 DIAGNOSIS — E039 Hypothyroidism, unspecified: Secondary | ICD-10-CM

## 2023-11-02 DIAGNOSIS — E119 Type 2 diabetes mellitus without complications: Secondary | ICD-10-CM | POA: Insufficient documentation

## 2023-11-02 DIAGNOSIS — M62838 Other muscle spasm: Secondary | ICD-10-CM

## 2023-11-02 DIAGNOSIS — F411 Generalized anxiety disorder: Secondary | ICD-10-CM | POA: Diagnosis not present

## 2023-11-02 MED ORDER — LEVOTHYROXINE SODIUM 75 MCG PO TABS: 75.0000 ug | ORAL_TABLET | Freq: Every day | ORAL | 0 refills | Status: DC

## 2023-11-02 MED ORDER — BACLOFEN 10 MG PO TABS: 10.0000 mg | ORAL_TABLET | Freq: Two times a day (BID) | ORAL | 1 refills | Status: AC

## 2023-11-02 MED ORDER — BUSPIRONE HCL 5 MG PO TABS: 5.0000 mg | ORAL_TABLET | Freq: Three times a day (TID) | ORAL | 1 refills | Status: DC | PRN

## 2023-11-02 MED ORDER — CYANOCOBALAMIN 1000 MCG/ML IJ SOLN
1000.0000 ug | Freq: Once | INTRAMUSCULAR | Status: AC
  Administered 2023-11-02: 1000 ug via INTRAMUSCULAR

## 2023-11-03 DIAGNOSIS — M5481 Occipital neuralgia: Secondary | ICD-10-CM | POA: Diagnosis not present

## 2023-11-03 DIAGNOSIS — M542 Cervicalgia: Secondary | ICD-10-CM | POA: Diagnosis not present

## 2023-11-03 DIAGNOSIS — M7918 Myalgia, other site: Secondary | ICD-10-CM | POA: Diagnosis not present

## 2023-11-03 DIAGNOSIS — M47812 Spondylosis without myelopathy or radiculopathy, cervical region: Secondary | ICD-10-CM | POA: Diagnosis not present

## 2023-11-03 DIAGNOSIS — G44221 Chronic tension-type headache, intractable: Secondary | ICD-10-CM | POA: Diagnosis not present

## 2023-11-06 DIAGNOSIS — M5481 Occipital neuralgia: Secondary | ICD-10-CM | POA: Diagnosis not present

## 2023-11-06 DIAGNOSIS — M7918 Myalgia, other site: Secondary | ICD-10-CM | POA: Diagnosis not present

## 2023-11-20 ENCOUNTER — Encounter: Payer: PPO | Attending: Family Medicine | Admitting: Dietician

## 2023-11-20 DIAGNOSIS — E119 Type 2 diabetes mellitus without complications: Secondary | ICD-10-CM | POA: Diagnosis present

## 2023-11-20 NOTE — Patient Instructions (Signed)
Keep food diary for 3-4 days and return for evaluation Test blood sugar fasting and again about 2 hours after main meal of the day unless doctor has given other instructions Return for class 2 on Monday Dec. 9 at 5:30pm, check in at front desk

## 2023-11-20 NOTE — Progress Notes (Signed)
Diabetes Self-Management Education  Visit Type: First/Initial  Appt. Start Time: 1730 Appt. End Time: 1930  11/20/2023  [name], identified by name and date of birth, is a 77 y.o. female with a diagnosis of Diabetes: Type 2.   ASSESSMENT  There were no vitals taken for this visit. There is no height or weight on file to calculate BMI.   Diabetes Self-Management Education - 11/20/23 1947       Visit Information   Visit Type First/Initial      Initial Visit   Diabetes Type Type 2    Date Diagnosed 10/2023    Are you currently following a meal plan? No    Are you taking your medications as prescribed? Not on Medications      Health Coping   How would you rate your overall health? Good      Psychosocial Assessment   Patient Belief/Attitude about Diabetes Motivated to manage diabetes    Other persons present Spouse/SO    Special Needs None    Learning Readiness Ready    How often do you need to have someone help you when you read instructions, pamphlets, or other written materials from your doctor or pharmacy? 1 - Never    What is the last grade level you completed in school? 12      Pre-Education Assessment   Patient understands the diabetes disease and treatment process. Needs Instruction    Patient understands incorporating nutritional management into lifestyle. Needs Instruction    Patient undertands incorporating physical activity into lifestyle. Needs Review    Patient understands using medications safely. Needs Review    Patient understands monitoring blood glucose, interpreting and using results Comprehends key points    Patient understands prevention, detection, and treatment of acute complications. Needs Instruction    Patient understands prevention, detection, and treatment of chronic complications. Needs Instruction    Patient understands how to develop strategies to address psychosocial issues. Needs Instruction    Patient understands how to develop  strategies to promote health/change behavior. Comprehends key points      Complications   Last HgB A1C per patient/outside source 6.6 %    How often do you check your blood sugar? 3-4 times / week    Fasting Blood glucose range (mg/dL) 78-295    Have you had a dilated eye exam in the past 12 months? Yes    Have you had a dental exam in the past 12 months? Yes    Are you checking your feet? Yes    How many days per week are you checking your feet? 2      Activity / Exercise   Activity / Exercise Type Light (walking / raking leaves)    How many days per week do you exercise? 1    How many minutes per day do you exercise? 30    Total minutes per week of exercise 30      Patient Education   Previous Diabetes Education No    Disease Pathophysiology Definition of diabetes, type 1 and 2, and the diagnosis of diabetes;Factors that contribute to the development of diabetes;Explored patient's options for treatment of their diabetes    Healthy Eating Role of diet in the treatment of diabetes and the relationship between the three main macronutrients and blood glucose level;Plate Method;Reviewed blood glucose goals for pre and post meals and how to evaluate the patients' food intake on their blood glucose level.    Being Active Role of exercise on  diabetes management, blood pressure control and cardiac health.    Monitoring Purpose and frequency of SMBG.;Taught/discussed recording of test results and interpretation of SMBG.;Interpreting lab values - A1C, lipid, urine microalbumina.;Identified appropriate SMBG and/or A1C goals.    Acute complications Taught prevention, symptoms, and  treatment of hypoglycemia - the 15 rule.;Discussed and identified patients' prevention, symptoms, and treatment of hyperglycemia.    Chronic complications Relationship between chronic complications and blood glucose control;Lipid levels, blood glucose control and heart disease;Reviewed with patient heart disease, higher risk  of, and prevention    Diabetes Stress and Support Role of stress on diabetes;Helped patient identify a support system for diabetes management      Outcomes   Expected Outcomes Demonstrated interest in learning. Expect positive outcomes    Future DMSE Other (comment)   11/27/23            Individualized Plan for Diabetes Self-Management Training:   Learning Objective:  Patient will have a greater understanding of diabetes self-management. Patient education plan is to attend individual and/or group sessions per assessed needs and concerns.   Plan:   Patient Instructions  Keep food diary for 3-4 days and return for evaluation Test blood sugar fasting and again about 2 hours after main meal of the day unless doctor has given other instructions Return for class 2 on Monday Dec. 9 at 5:30pm, check in at front desk  Expected Outcomes:  Demonstrated interest in learning. Expect positive outcomes  Education material provided: ADA - How to Thrive: A Guide for Your Journey with Diabetes; plate planner with food lists, food diary sheet, BG testing log sheet  If problems or questions, patient to contact team via:  Phone and patient portal  Future DSME appointment: Other (comment) (11/27/23)

## 2023-11-27 ENCOUNTER — Encounter: Payer: PPO | Admitting: Dietician

## 2023-11-27 VITALS — Wt 180.8 lb

## 2023-11-27 DIAGNOSIS — E119 Type 2 diabetes mellitus without complications: Secondary | ICD-10-CM

## 2023-11-27 NOTE — Progress Notes (Signed)
Patient was seen on 11/27/23 for the second of a series of three diabetes self-management courses at the [facility]. The following learning objectives were met by the patient during this class:  Describe the role of different macronutrients on glucose Explain how carbohydrates affect blood glucose State what foods contain the most carbohydrates Demonstrate carbohydrate counting Demonstrate how to read Nutrition Facts food label Describe effects of various fats on heart health Describe the importance of good nutrition for health and healthy eating strategies Describe techniques for managing your shopping, cooking and meal planning List strategies to follow meal plan when dining out Describe the effects of alcohol on glucose and how to use it safely  Goals:  Follow Diabetes Meal Plan as instructed  Aim to spread carbs evenly throughout the day  Aim for 3 meals per day and snacks as needed Include lean protein foods to meals/snacks  Monitor glucose levels as instructed by your doctor   Follow-Up Plan: Attend Core 3 Diabetes Class 12/04/23 Work towards following your personal food plan.

## 2023-12-01 ENCOUNTER — Encounter: Payer: Self-pay | Admitting: Family Medicine

## 2023-12-01 ENCOUNTER — Other Ambulatory Visit: Payer: Self-pay | Admitting: Family Medicine

## 2023-12-01 DIAGNOSIS — R1319 Other dysphagia: Secondary | ICD-10-CM

## 2023-12-01 DIAGNOSIS — Z8719 Personal history of other diseases of the digestive system: Secondary | ICD-10-CM

## 2023-12-04 ENCOUNTER — Encounter: Payer: PPO | Admitting: Dietician

## 2023-12-04 DIAGNOSIS — E119 Type 2 diabetes mellitus without complications: Secondary | ICD-10-CM

## 2023-12-04 NOTE — Progress Notes (Signed)
Patient was seen on 12/04/23 for the third of a series of three diabetes self-management courses at [facility].  Education:  State the amount of activity recommended for healthy living Describe activities suitable for individual needs Identify ways to regularly incorporate activity into daily life Identify barriers to activity and ways to over come these barriers Identify diabetes medications being personally used and their primary action for lowering glucose and possible side effects Describe role of stress on blood glucose and develop strategies to address psychosocial issues Identify diabetes complications and ways to prevent them Explain how to manage diabetes during illness Evaluate success in meeting personal goal Establish 2-3 goals that they will plan to diligently work on  Goals:  I will resume regular exercise beginning January 2025 I will test my glucose at least 1 times a day, 3-4 days a week  Your patient has identified these potential barriers to change:    Your patient has identified their diabetes self-care support plan as      Plan:  Attend Support Group as desired

## 2023-12-14 ENCOUNTER — Telehealth: Payer: PPO | Admitting: Physician Assistant

## 2023-12-14 DIAGNOSIS — B9689 Other specified bacterial agents as the cause of diseases classified elsewhere: Secondary | ICD-10-CM

## 2023-12-14 DIAGNOSIS — J019 Acute sinusitis, unspecified: Secondary | ICD-10-CM

## 2023-12-14 MED ORDER — AMOXICILLIN-POT CLAVULANATE 875-125 MG PO TABS: 1.0000 | ORAL_TABLET | Freq: Two times a day (BID) | ORAL | 0 refills | Status: DC

## 2023-12-14 NOTE — Progress Notes (Signed)
E-Visit for Sinus Problems  We are sorry that you are not feeling well.  Here is how we plan to help!  Based on what you have shared with me it looks like you have sinusitis.  Sinusitis is inflammation and infection in the sinus cavities of the head.  Based on your presentation I believe you most likely have Acute Bacterial Sinusitis.  This is an infection caused by bacteria and is treated with antibiotics. I have prescribed Augmentin 875mg/125mg one tablet twice daily with food, for 7 days. You may use an oral decongestant such as Mucinex D or if you have glaucoma or high blood pressure use plain Mucinex. Saline nasal spray help and can safely be used as often as needed for congestion.  If you develop worsening sinus pain, fever or notice severe headache and vision changes, or if symptoms are not better after completion of antibiotic, please schedule an appointment with a health care provider.    Sinus infections are not as easily transmitted as other respiratory infection, however we still recommend that you avoid close contact with loved ones, especially the very young and elderly.  Remember to wash your hands thoroughly throughout the day as this is the number one way to prevent the spread of infection!  Home Care: Only take medications as instructed by your medical team. Complete the entire course of an antibiotic. Do not take these medications with alcohol. A steam or ultrasonic humidifier can help congestion.  You can place a towel over your head and breathe in the steam from hot water coming from a faucet. Avoid close contacts especially the very young and the elderly. Cover your mouth when you cough or sneeze. Always remember to wash your hands.  Get Help Right Away If: You develop worsening fever or sinus pain. You develop a severe head ache or visual changes. Your symptoms persist after you have completed your treatment plan.  Make sure you Understand these instructions. Will watch  your condition. Will get help right away if you are not doing well or get worse.  Thank you for choosing an e-visit.  Your e-visit answers were reviewed by a board certified advanced clinical practitioner to complete your personal care plan. Depending upon the condition, your plan could have included both over the counter or prescription medications.  Please review your pharmacy choice. Make sure the pharmacy is open so you can pick up prescription now. If there is a problem, you may contact your provider through MyChart messaging and have the prescription routed to another pharmacy.  Your safety is important to us. If you have drug allergies check your prescription carefully.   For the next 24 hours you can use MyChart to ask questions about today's visit, request a non-urgent call back, or ask for a work or school excuse. You will get an email in the next two days asking about your experience. I hope that your e-visit has been valuable and will speed your recovery.  I have spent 5 minutes in review of e-visit questionnaire, review and updating patient chart, medical decision making and response to patient.   Calub Tarnow M Caliber Landess, PA-C  

## 2024-01-01 ENCOUNTER — Encounter: Payer: Self-pay | Admitting: Dietician

## 2024-01-11 ENCOUNTER — Telehealth: Payer: PPO | Admitting: Physician Assistant

## 2024-01-11 DIAGNOSIS — J069 Acute upper respiratory infection, unspecified: Secondary | ICD-10-CM | POA: Diagnosis not present

## 2024-01-11 MED ORDER — BENZONATATE 100 MG PO CAPS: 100.0000 mg | ORAL_CAPSULE | Freq: Three times a day (TID) | ORAL | 0 refills | Status: DC | PRN

## 2024-01-11 NOTE — Progress Notes (Signed)

## 2024-01-11 NOTE — Progress Notes (Signed)
I have spent 5 minutes in review of e-visit questionnaire, review and updating patient chart, medical decision making and response to patient.   Mia Milan Cody Jacklynn Dehaas, PA-C    

## 2024-01-17 ENCOUNTER — Encounter: Payer: Self-pay | Admitting: Family Medicine

## 2024-01-17 ENCOUNTER — Ambulatory Visit (INDEPENDENT_AMBULATORY_CARE_PROVIDER_SITE_OTHER): Payer: PPO | Admitting: Family Medicine

## 2024-01-17 VITALS — BP 122/80 | HR 80 | Temp 98.0°F | Resp 16 | Ht 65.0 in | Wt 177.1 lb

## 2024-01-17 DIAGNOSIS — E538 Deficiency of other specified B group vitamins: Secondary | ICD-10-CM | POA: Diagnosis not present

## 2024-01-17 DIAGNOSIS — J069 Acute upper respiratory infection, unspecified: Secondary | ICD-10-CM | POA: Diagnosis not present

## 2024-01-17 DIAGNOSIS — J40 Bronchitis, not specified as acute or chronic: Secondary | ICD-10-CM

## 2024-01-17 MED ORDER — CYANOCOBALAMIN 1000 MCG/ML IJ SOLN
1000.0000 ug | Freq: Once | INTRAMUSCULAR | Status: AC
  Administered 2024-01-17: 1000 ug via INTRAMUSCULAR

## 2024-01-17 MED ORDER — HYDROCOD POLI-CHLORPHE POLI ER 10-8 MG/5ML PO SUER: 5.0000 mL | Freq: Every evening | ORAL | 0 refills | Status: DC | PRN

## 2024-01-17 MED ORDER — TRELEGY ELLIPTA 100-62.5-25 MCG/ACT IN AEPB: 1.0000 | INHALATION_SPRAY | Freq: Every day | RESPIRATORY_TRACT | Status: DC

## 2024-01-17 NOTE — Progress Notes (Signed)
Name: Dawn Fuentes   MRN: [id number]    DOB: 05-31-1946   Date:01/17/2024       Progress Note  Subjective  Chief Complaint  Chief Complaint  Patient presents with   Cough    Coughing up mucus onset for a week   Nasal Congestion    Drainage going down throat   HPI   URI: patient was treated with Augmentin end of Dec for sinusitis, she felt better but about one week ago she developed scratchy throat, for a few hours had right otalgia, followed by rhinorrhea, post nasal drainage and a cough that now is deep in her chest. She denies fever or chills. Her appetite is poor. Denies sick contacts. She had an evisit and was given Benozonate but continues to cough and sputum is green. She is gradually getting better.   [name] deficiency: due for [name] injections  DMII: seen by dietician and is trying to follow a diabetic diet  Patient Active Problem List   Diagnosis Date Noted   Diabetes mellitus, new onset (HCC) 11/02/2023   Occipital neuralgia of right side 09/07/2023   Perennial allergic rhinitis with seasonal variation 09/07/2023   Lichen simplex 03/08/2022   Chronic nonintractable headache 07/06/2021   Cervicogenic migraine 01/14/2021   Cervical facet joint syndrome 01/14/2021   Cervical radiculitis 12/01/2020   Bilateral hip pain 12/01/2020   Chronic bilateral low back pain without sciatica 12/01/2020   Chronic SI joint pain 12/01/2020   Chronic pain syndrome 12/01/2020   Body mass index (BMI) 30.0-30.9, adult 11/18/2020   Essential (primary) hypertension 11/18/2020   Cervical radicular pain 11/18/2020   Degeneration of cervical intervertebral disc 06/25/2020   Lichen sclerosus et atrophicus of the vulva 11/04/2019   Vitamin [name] deficiency 12/26/2018   Osteoarthritis of knees, bilateral 05/26/2017   Primary open angle glaucoma of both eyes 11/21/2016   Chronic kidney disease (CKD), stage III (moderate) (HCC) 04/06/2016   Cystitis 10/12/2015   GAD (generalized anxiety disorder)  08/21/2015   Benign hypertension 08/21/2015   Insomnia, persistent 08/21/2015   Dyslipidemia 08/21/2015   Gastro-esophageal reflux disease without esophagitis 08/21/2015   Glaucoma 08/21/2015   Blood glucose elevated 08/21/2015   Adult hypothyroidism 08/21/2015   Climacteric 08/21/2015   Senile osteoporosis 08/21/2015   Seasonal allergies 08/21/2015   Female genuine stress incontinence 08/21/2015   Vitamin D deficiency 08/21/2015    Past Surgical History:  Procedure Laterality Date   ABDOMINAL HYSTERECTOMY     21 years ago   APPENDECTOMY     COLONOSCOPY N/A 09/14/2020   Procedure: COLONOSCOPY;  Surgeon: Regis Bill, MD;  Location: [facility] ENDOSCOPY;  Service: Endoscopy;  Laterality: N/A;   COLONOSCOPY WITH PROPOFOL N/A 05/29/2015   Procedure: COLONOSCOPY WITH PROPOFOL;  Surgeon: Wallace Cullens, MD;  Location: [facility] ENDOSCOPY;  Service: Gastroenterology;  Laterality: N/A;   ESOPHAGOGASTRODUODENOSCOPY N/A 05/29/2015   Procedure: ESOPHAGOGASTRODUODENOSCOPY (EGD);  Surgeon: Wallace Cullens, MD;  Location: [facility] ENDOSCOPY;  Service: Gastroenterology;  Laterality: N/A;   ESOPHAGOGASTRODUODENOSCOPY N/A 09/14/2020   Procedure: ESOPHAGOGASTRODUODENOSCOPY (EGD);  Surgeon: Regis Bill, MD;  Location: [facility] ENDOSCOPY;  Service: Endoscopy;  Laterality: N/A;   EYE SURGERY     eye lids   OVARIAN CYST REMOVAL     needed transfusion   PAROTIDECTOMY Right 05/05/2021   Procedure: SUPERFICIAL PAROTIDECTOMY;  Surgeon: Geanie Logan, MD;  Location: [facility] ORS;  Service: ENT;  Laterality: Right;   PHOTOCOAGULATION WITH LASER Right 11/21/2016   Procedure: PHOTOCOAGULATION WITH LASER;  Surgeon: Synetta Fail  Chinita Greenland, MD;  Location: [facility];  Service: Ophthalmology;  Laterality: Right;  A micropulse probe was applied to each hemilimbus with the following settings: , 31.3% duty cycle, 90 seconds x 3 applications.    TONSILLECTOMY      Family History  Problem Relation Age of Onset    Hypertension Mother    Osteoporosis Mother    COPD Mother    Dementia Mother    Cancer Father        Colon   Hypothyroidism Daughter    Aneurysm Brother    COPD Sister     Social History   Tobacco Use   Smoking status: Never   Smokeless tobacco: Never   Tobacco comments:    smoking cessation materials not required  Substance Use Topics   Alcohol use: No    Alcohol/week: 0.0 standard drinks of alcohol     Current Outpatient Medications:    ALPRAZolam (XANAX) 0.25 MG tablet, Take 1 tablet (0.25 mg total) by mouth daily as needed for anxiety. Rarely, Disp: 10 tablet, Rfl: 0   benzonatate (TESSALON) 100 MG capsule, Take 1 capsule (100 mg total) by mouth 3 (three) times daily as needed for cough., Disp: 30 capsule, Rfl: 0   Calcium Carb-Cholecalciferol 600-800 MG-UNIT TABS, Take 1 tablet by mouth daily., Disp: , Rfl:    chlorpheniramine-HYDROcodone (TUSSIONEX) 10-8 MG/5ML, Take 5 mLs by mouth at bedtime as needed for cough., Disp: 115 mL, Rfl: 0   citalopram (CELEXA) 20 MG tablet, Take 1 tablet (20 mg total) by mouth daily., Disp: 90 tablet, Rfl: 1   clobetasol ointment (TEMOVATE) 0.05 %, Apply 1 application. topically 2 (two) times a week., Disp: 30 g, Rfl: 2   denosumab (PROLIA) 60 MG/ML SOSY injection, Inject 60 mg into the skin every 6 (six) months., Disp: , Rfl:    Fluticasone-Umeclidin-Vilant (TRELEGY ELLIPTA) 100-62.5-25 MCG/ACT AEPB, Inhale 1 puff into the lungs daily., Disp: , Rfl:    levocetirizine (XYZAL) 5 MG tablet, Take 1 tablet (5 mg total) by mouth every evening., Disp: 90 tablet, Rfl: 1   levothyroxine (SYNTHROID) 75 MCG tablet, Take 1 tablet (75 mcg total) by mouth daily before breakfast. Except for Sunday take half pill, Disp: 90 tablet, Rfl: 0   losartan-hydrochlorothiazide (HYZAAR) 50-12.5 MG tablet, Take 1 tablet by mouth daily., Disp: 90 tablet, Rfl: 1   pantoprazole (PROTONIX) 40 MG tablet, Take 1 tablet (40 mg total) by mouth daily., Disp: 90 tablet, Rfl: 1    propranolol ER (INDERAL LA) 80 MG 24 hr capsule, , Disp: , Rfl:    simvastatin (ZOCOR) 40 MG tablet, Take 1 tablet (40 mg total) by mouth every evening., Disp: 90 tablet, Rfl: 1   timolol (TIMOPTIC) 0.5 % ophthalmic solution, Place 1 drop into both eyes 2 (two) times daily., Disp: , Rfl: 5   Turmeric 500 MG TABS, Take 500 mg by mouth in the morning and at bedtime., Disp: , Rfl:    VYZULTA 0.024 % SOLN, Place 1 drop into both eyes at bedtime., Disp: , Rfl: 2   zolpidem (AMBIEN) 5 MG tablet, Take 1 tablet (5 mg total) by mouth at bedtime., Disp: 90 tablet, Rfl: 1   baclofen (LIORESAL) 10 MG tablet, Take 1 tablet (10 mg total) by mouth 2 (two) times daily., Disp: 180 each, Rfl: 1   busPIRone (BUSPAR) 5 MG tablet, Take 1 tablet (5 mg total) by mouth 3 (three) times daily as needed., Disp: 180 tablet, Rfl: 1  Allergies  Allergen  Reactions   Brimonidine Tartrate Other (See Comments)    Burning in eyes   Latanoprost Other (See Comments)    burning   Meloxicam     Increased blood pressure    Raloxifene     bone pain    I personally reviewed active problem list, medication list, allergies, family history with the patient/caregiver today.   ROS  Ten systems reviewed and is negative except as mentioned in HPI    Objective  Vitals:   01/17/24 1325  BP: 122/80  Pulse: 80  Resp: 16  Temp: 98 F (36.7 C)  TempSrc: Oral  SpO2: 96%  Weight: 177 lb 1.6 oz (80.3 kg)  Height: 5\' 5"  (1.651 m)    Body mass index is 29.47 kg/m.  Physical Exam  Constitutional: Patient appears well-developed and well-nourished.  No distress.  HEENT: head atraumatic, normocephalic, pupils equal and reactive to light, ears normal TM's , neck supple, throat within normal limits, no tenderness during percussion of sinus  Cardiovascular: Normal rate, regular rhythm and normal heart sounds.  No murmur heard. No BLE edema. Pulmonary/Chest: Effort normal and breath sounds normal. No respiratory  distress. Abdominal: Soft.  There is no tenderness. Psychiatric: Patient has a normal mood and affect. behavior is normal. Judgment and thought content normal.    Diabetic Foot Exam:     PHQ2/9:    01/17/2024    1:21 PM 11/02/2023   11:27 AM 09/07/2023   11:36 AM 06/07/2023    2:14 PM 05/18/2023   10:26 AM  Depression screen PHQ 2/9  Decreased Interest 0 0 0 0 0  Down, Depressed, Hopeless 0 0 0 0 0  PHQ - 2 Score 0 0 0 0 0  Altered sleeping 0 1 1 0   Tired, decreased energy 0 1 0 0   Change in appetite 0 0 0 0   Feeling bad or failure about yourself  0 0 0 0   Trouble concentrating 0 0 0 0   Moving slowly or fidgety/restless 0 0 0 0   Suicidal thoughts 0 0 0 0   PHQ-9 Score 0 2 1 0   Difficult doing work/chores Not difficult at all Not difficult at all Not difficult at all Not difficult at all     phq 9 is negative  Fall Risk:    01/17/2024    1:21 PM 11/02/2023   11:26 AM 09/07/2023   11:36 AM 06/07/2023    2:14 PM 05/14/2023   11:08 AM  Fall Risk   Falls in the past year? 0 0 0 0 0  Number falls in past yr: 0   0   Injury with Fall? 0   0   Risk for fall due to : No Fall Risks No Fall Risks No Fall Risks No Fall Risks   Follow up Falls prevention discussed;Education provided;Falls evaluation completed Falls prevention discussed Falls prevention discussed Falls prevention discussed;Education provided;Falls evaluation completed      Assessment & Plan   1. URI, acute (Primary)  - chlorpheniramine-HYDROcodone (TUSSIONEX) 10-8 MG/5ML; Take 5 mLs by mouth at bedtime as needed for cough.  Dispense: 115 mL; Refill: 0 Continue mucinex and benzonate  Increase fluid intake, rest and saline spray   2. Bronchitis  - chlorpheniramine-HYDROcodone (TUSSIONEX) 10-8 MG/5ML; Take 5 mLs by mouth at bedtime as needed for cough.  Dispense: 115 mL; Refill: 0 - Fluticasone-Umeclidin-Vilant (TRELEGY ELLIPTA) 100-62.5-25 MCG/ACT AEPB; Inhale 1 puff into the lungs daily.  3. [name]  deficiency  [name] today   [name] today

## 2024-01-17 NOTE — Addendum Note (Signed)
Addended by: Forde Radon on: 01/17/2024 01:55 PM   Modules accepted: Orders

## 2024-02-13 DIAGNOSIS — M810 Age-related osteoporosis without current pathological fracture: Secondary | ICD-10-CM | POA: Diagnosis not present

## 2024-02-13 DIAGNOSIS — N1832 Chronic kidney disease, stage 3b: Secondary | ICD-10-CM | POA: Diagnosis not present

## 2024-02-15 ENCOUNTER — Other Ambulatory Visit: Payer: Self-pay | Admitting: Family Medicine

## 2024-02-15 DIAGNOSIS — F411 Generalized anxiety disorder: Secondary | ICD-10-CM

## 2024-02-19 DIAGNOSIS — K644 Residual hemorrhoidal skin tags: Secondary | ICD-10-CM | POA: Diagnosis not present

## 2024-02-19 DIAGNOSIS — K449 Diaphragmatic hernia without obstruction or gangrene: Secondary | ICD-10-CM | POA: Diagnosis not present

## 2024-02-19 DIAGNOSIS — K222 Esophageal obstruction: Secondary | ICD-10-CM | POA: Diagnosis not present

## 2024-02-19 DIAGNOSIS — R1319 Other dysphagia: Secondary | ICD-10-CM | POA: Diagnosis not present

## 2024-02-19 DIAGNOSIS — K219 Gastro-esophageal reflux disease without esophagitis: Secondary | ICD-10-CM | POA: Diagnosis not present

## 2024-02-26 ENCOUNTER — Other Ambulatory Visit: Payer: Self-pay | Admitting: Family Medicine

## 2024-02-26 DIAGNOSIS — G4700 Insomnia, unspecified: Secondary | ICD-10-CM

## 2024-02-28 DIAGNOSIS — M810 Age-related osteoporosis without current pathological fracture: Secondary | ICD-10-CM | POA: Diagnosis not present

## 2024-02-29 ENCOUNTER — Ambulatory Visit

## 2024-02-29 DIAGNOSIS — K449 Diaphragmatic hernia without obstruction or gangrene: Secondary | ICD-10-CM | POA: Diagnosis not present

## 2024-02-29 DIAGNOSIS — K222 Esophageal obstruction: Secondary | ICD-10-CM | POA: Diagnosis not present

## 2024-02-29 DIAGNOSIS — R1310 Dysphagia, unspecified: Secondary | ICD-10-CM | POA: Diagnosis not present

## 2024-03-08 ENCOUNTER — Ambulatory Visit: Payer: PPO | Admitting: Family Medicine

## 2024-03-11 ENCOUNTER — Ambulatory Visit (INDEPENDENT_AMBULATORY_CARE_PROVIDER_SITE_OTHER): Payer: PPO | Admitting: Family Medicine

## 2024-03-11 ENCOUNTER — Encounter: Payer: Self-pay | Admitting: Family Medicine

## 2024-03-11 VITALS — BP 122/74 | HR 73 | Resp 16 | Ht 65.0 in | Wt 176.4 lb

## 2024-03-11 DIAGNOSIS — E039 Hypothyroidism, unspecified: Secondary | ICD-10-CM | POA: Diagnosis not present

## 2024-03-11 DIAGNOSIS — E785 Hyperlipidemia, unspecified: Secondary | ICD-10-CM | POA: Diagnosis not present

## 2024-03-11 DIAGNOSIS — I129 Hypertensive chronic kidney disease with stage 1 through stage 4 chronic kidney disease, or unspecified chronic kidney disease: Secondary | ICD-10-CM | POA: Diagnosis not present

## 2024-03-11 DIAGNOSIS — N1831 Chronic kidney disease, stage 3a: Secondary | ICD-10-CM

## 2024-03-11 DIAGNOSIS — R739 Hyperglycemia, unspecified: Secondary | ICD-10-CM | POA: Diagnosis not present

## 2024-03-11 DIAGNOSIS — I10 Essential (primary) hypertension: Secondary | ICD-10-CM

## 2024-03-11 DIAGNOSIS — F411 Generalized anxiety disorder: Secondary | ICD-10-CM | POA: Diagnosis not present

## 2024-03-11 DIAGNOSIS — E538 Deficiency of other specified B group vitamins: Secondary | ICD-10-CM | POA: Diagnosis not present

## 2024-03-11 DIAGNOSIS — J302 Other seasonal allergic rhinitis: Secondary | ICD-10-CM

## 2024-03-11 DIAGNOSIS — G4700 Insomnia, unspecified: Secondary | ICD-10-CM

## 2024-03-11 DIAGNOSIS — E1122 Type 2 diabetes mellitus with diabetic chronic kidney disease: Secondary | ICD-10-CM

## 2024-03-11 DIAGNOSIS — J3089 Other allergic rhinitis: Secondary | ICD-10-CM

## 2024-03-11 DIAGNOSIS — K219 Gastro-esophageal reflux disease without esophagitis: Secondary | ICD-10-CM

## 2024-03-11 DIAGNOSIS — N183 Chronic kidney disease, stage 3 unspecified: Secondary | ICD-10-CM | POA: Diagnosis not present

## 2024-03-11 LAB — POCT GLYCOSYLATED HEMOGLOBIN (HGB A1C): Hemoglobin A1C: 6.1 % — AB (ref 4.0–5.6)

## 2024-03-11 MED ORDER — SIMVASTATIN 40 MG PO TABS: 40.0000 mg | ORAL_TABLET | Freq: Every evening | ORAL | 1 refills | Status: DC

## 2024-03-11 MED ORDER — BUSPIRONE HCL 5 MG PO TABS: 5.0000 mg | ORAL_TABLET | Freq: Three times a day (TID) | ORAL | 0 refills | Status: DC | PRN

## 2024-03-11 MED ORDER — ZOLPIDEM TARTRATE 5 MG PO TABS: 5.0000 mg | ORAL_TABLET | Freq: Every day | ORAL | 0 refills | Status: DC

## 2024-03-11 MED ORDER — CYANOCOBALAMIN 1000 MCG/ML IJ SOLN
1000.0000 ug | Freq: Once | INTRAMUSCULAR | Status: AC
  Administered 2024-03-11: 1000 ug via INTRAMUSCULAR

## 2024-03-11 MED ORDER — LEVOTHYROXINE SODIUM 75 MCG PO TABS: 75.0000 ug | ORAL_TABLET | Freq: Every day | ORAL | 0 refills | Status: DC

## 2024-03-11 MED ORDER — PANTOPRAZOLE SODIUM 40 MG PO TBEC: 40.0000 mg | DELAYED_RELEASE_TABLET | Freq: Every day | ORAL | 1 refills | Status: DC

## 2024-03-11 MED ORDER — LEVOCETIRIZINE DIHYDROCHLORIDE 5 MG PO TABS: 5.0000 mg | ORAL_TABLET | Freq: Every evening | ORAL | 1 refills | Status: AC

## 2024-03-11 MED ORDER — CITALOPRAM HYDROBROMIDE 20 MG PO TABS: 20.0000 mg | ORAL_TABLET | Freq: Every day | ORAL | 1 refills | Status: DC

## 2024-03-11 MED ORDER — LOSARTAN POTASSIUM-HCTZ 50-12.5 MG PO TABS: 1.0000 | ORAL_TABLET | Freq: Every day | ORAL | 1 refills | Status: DC

## 2024-03-11 NOTE — Progress Notes (Signed)
 Name: Dawn Fuentes   MRN: [id number]    DOB: 05/04/1946   Date:03/11/2024       Progress Note  Subjective  Chief Complaint  Chief Complaint  Patient presents with   Medical Management of Chronic Issues   HPI   DM type 2 with associated HTN and CKI stage IIIa: diagnosed based on labs done October 2024 - A1C of 6.6 % and fasting glucose of 127. She denies polyphagia, polydipsia or polyuria. She states no longer eating chocolate at night, she stopped doing that since last visit and A1C is down to 6.1 % , discussed yearly eye exam. On statin, ARB and diet only    GAD: she was doing well on Citalopram, Buspar twice daily and alprazolam prn. She had to take a couple of pills recently doing to GI problems but will only take again for panic attacks. She also takes Ambien 5 mg for sleep    Hypothyroidism: last TSH was at goal, she is  taking levothyroxine as prescribed 75 mcg daily and half once a week . Denies weight gain or  hair loss She was having dysphagia but resolved with esophageal dilation done recently    CKI stage IIIa: avoiding nsaid's  denies pruritis or decrease in urinary output, she is on ARB , GFR dropped a little during recent visit to Endo, we will recheck next visit      GERD: taking medication and symptoms are controlled   HTN: she is compliant with medication, denies chest pain or palpitation. Continue medication    Headaches/Occipital neuralgia : under the care of neurologist, she is now taking propanolol 60 mg and states headache still most days of the week, taking tylenol prn    Post menopausal osteoporosis: she was getting prolia but due to high cost switching to Reclast  Recent gastroenteritis: finally feeling better, had nausea, vomiting and diarrhea and abdominal cramping that lasted 24 hours , but felt tired for 3 days after that   [name] deficiency: she prefers getting b12 injections    Patient Active Problem List   Diagnosis Date Noted   Type 2 diabetes  mellitus with stage 3 chronic kidney disease and hypertension (HCC) 03/11/2024   Diabetes mellitus, new onset (HCC) 11/02/2023   Occipital neuralgia of right side 09/07/2023   Perennial allergic rhinitis with seasonal variation 09/07/2023   Lichen simplex 03/08/2022   Chronic nonintractable headache 07/06/2021   Cervicogenic migraine 01/14/2021   Cervical facet joint syndrome 01/14/2021   Cervical radiculitis 12/01/2020   Bilateral hip pain 12/01/2020   Chronic bilateral low back pain without sciatica 12/01/2020   Chronic SI joint pain 12/01/2020   Chronic pain syndrome 12/01/2020   Body mass index (BMI) 30.0-30.9, adult 11/18/2020   Essential (primary) hypertension 11/18/2020   Cervical radicular pain 11/18/2020   Degeneration of cervical intervertebral disc 06/25/2020   Lichen sclerosus et atrophicus of the vulva 11/04/2019   [name] deficiency 12/26/2018   Osteoarthritis of knees, bilateral 05/26/2017   Primary open angle glaucoma of both eyes 11/21/2016   Chronic kidney disease (CKD), stage III (moderate) (HCC) 04/06/2016   Cystitis 10/12/2015   GAD (generalized anxiety disorder) 08/21/2015   Benign hypertension 08/21/2015   Insomnia, persistent 08/21/2015   Dyslipidemia 08/21/2015   Gastro-esophageal reflux disease without esophagitis 08/21/2015   Glaucoma 08/21/2015   Blood glucose elevated 08/21/2015   Adult hypothyroidism 08/21/2015   Climacteric 08/21/2015   Senile osteoporosis 08/21/2015   Seasonal allergies 08/21/2015   Female genuine stress incontinence 08/21/2015  Vitamin D deficiency 08/21/2015    Past Surgical History:  Procedure Laterality Date   ABDOMINAL HYSTERECTOMY     21 years ago   APPENDECTOMY     COLONOSCOPY N/A 09/14/2020   Procedure: COLONOSCOPY;  Surgeon: Regis Bill, MD;  Location: [facility] ENDOSCOPY;  Service: Endoscopy;  Laterality: N/A;   COLONOSCOPY WITH PROPOFOL N/A 05/29/2015   Procedure: COLONOSCOPY WITH PROPOFOL;  Surgeon: Wallace Cullens,  MD;  Location: [facility] ENDOSCOPY;  Service: Gastroenterology;  Laterality: N/A;   ESOPHAGOGASTRODUODENOSCOPY N/A 05/29/2015   Procedure: ESOPHAGOGASTRODUODENOSCOPY (EGD);  Surgeon: Wallace Cullens, MD;  Location: [facility] ENDOSCOPY;  Service: Gastroenterology;  Laterality: N/A;   ESOPHAGOGASTRODUODENOSCOPY N/A 09/14/2020   Procedure: ESOPHAGOGASTRODUODENOSCOPY (EGD);  Surgeon: Regis Bill, MD;  Location: [facility] ENDOSCOPY;  Service: Endoscopy;  Laterality: N/A;   EYE SURGERY     eye lids   OVARIAN CYST REMOVAL     needed transfusion   PAROTIDECTOMY Right 05/05/2021   Procedure: SUPERFICIAL PAROTIDECTOMY;  Surgeon: Geanie Logan, MD;  Location: [facility] ORS;  Service: ENT;  Laterality: Right;   PHOTOCOAGULATION WITH LASER Right 11/21/2016   Procedure: PHOTOCOAGULATION WITH LASER;  Surgeon: Sherald Hess, MD;  Location: [facility];  Service: Ophthalmology;  Laterality: Right;  A micropulse probe was applied to each hemilimbus with the following settings: , 31.3% duty cycle, 90 seconds x 3 applications.    TONSILLECTOMY      Family History  Problem Relation Age of Onset   Hypertension Mother    Osteoporosis Mother    COPD Mother    Dementia Mother    Cancer Father        Colon   Hypothyroidism Daughter    Aneurysm Brother    COPD Sister     Social History   Tobacco Use   Smoking status: Never   Smokeless tobacco: Never   Tobacco comments:    smoking cessation materials not required  Substance Use Topics   Alcohol use: No    Alcohol/week: 0.0 standard drinks of alcohol     Current Outpatient Medications:    ALPRAZolam (XANAX) 0.25 MG tablet, Take 1 tablet (0.25 mg total) by mouth daily as needed for anxiety. Rarely, Disp: 10 tablet, Rfl: 0   baclofen (LIORESAL) 10 MG tablet, Take 1 tablet (10 mg total) by mouth 2 (two) times daily., Disp: 180 each, Rfl: 1   Calcium Carb-Cholecalciferol 600-800 MG-UNIT TABS, Take 1 tablet by mouth daily., Disp: , Rfl:    clobetasol  ointment (TEMOVATE) 0.05 %, Apply 1 application. topically 2 (two) times a week., Disp: 30 g, Rfl: 2   famotidine (PEPCID) 40 MG tablet, Take 1 tablet by mouth at bedtime., Disp: , Rfl:    hydrocortisone (ANUSOL-HC) 2.5 % rectal cream, Apply topically., Disp: , Rfl:    propranolol ER (INDERAL LA) 80 MG 24 hr capsule, , Disp: , Rfl:    timolol (TIMOPTIC) 0.5 % ophthalmic solution, Place 1 drop into both eyes 2 (two) times daily., Disp: , Rfl: 5   Turmeric 500 MG TABS, Take 500 mg by mouth in the morning and at bedtime., Disp: , Rfl:    VYZULTA 0.024 % SOLN, Place 1 drop into both eyes at bedtime., Disp: , Rfl: 2   busPIRone (BUSPAR) 5 MG tablet, Take 1 tablet (5 mg total) by mouth 3 (three) times daily as needed., Disp: 180 tablet, Rfl: 0   citalopram (CELEXA) 20 MG tablet, Take 1 tablet (20 mg total) by mouth daily., Disp: 90 tablet, Rfl:  1   levocetirizine (XYZAL) 5 MG tablet, Take 1 tablet (5 mg total) by mouth every evening., Disp: 90 tablet, Rfl: 1   levothyroxine (SYNTHROID) 75 MCG tablet, Take 1 tablet (75 mcg total) by mouth daily before breakfast. Except for Sunday take half pill, Disp: 90 tablet, Rfl: 0   losartan-hydrochlorothiazide (HYZAAR) 50-12.5 MG tablet, Take 1 tablet by mouth daily., Disp: 90 tablet, Rfl: 1   pantoprazole (PROTONIX) 40 MG tablet, Take 1 tablet (40 mg total) by mouth daily., Disp: 90 tablet, Rfl: 1   simvastatin (ZOCOR) 40 MG tablet, Take 1 tablet (40 mg total) by mouth every evening., Disp: 90 tablet, Rfl: 1   zolpidem (AMBIEN) 5 MG tablet, Take 1 tablet (5 mg total) by mouth at bedtime., Disp: 90 tablet, Rfl: 0  Allergies  Allergen Reactions   Brimonidine Tartrate Other (See Comments)    Burning in eyes   Latanoprost Other (See Comments)    burning   Meloxicam     Increased blood pressure    Raloxifene     bone pain    I personally reviewed active problem list, medication list, allergies with the patient/caregiver today.   ROS  Ten systems reviewed  and is negative except as mentioned in HPI    Objective  Vitals:   03/11/24 1329  BP: 122/74  Pulse: 73  Resp: 16  SpO2: 98%  Weight: 176 lb 6.4 oz (80 kg)  Height: 5\' 5"  (1.651 m)    Body mass index is 29.35 kg/m.  Physical Exam  Constitutional: Patient appears well-developed and well-nourished.  No distress.  HEENT: head atraumatic, normocephalic, pupils equal and reactive to light, neck supple Cardiovascular: Normal rate, regular rhythm and normal heart sounds.  No murmur heard. No BLE edema. Pulmonary/Chest: Effort normal and breath sounds normal. No respiratory distress. Abdominal: Soft.  There is no tenderness. Psychiatric: Patient has a normal mood and affect. behavior is normal. Judgment and thought content normal.   Recent Results (from the past 2160 hours)  POCT glycosylated hemoglobin (Hb A1C)     Status: Abnormal   Collection Time: 03/11/24  1:33 PM  Result Value Ref Range   Hemoglobin A1C 6.1 (A) 4.0 - 5.6 %   HbA1c POC (<> result, manual entry)     HbA1c, POC (prediabetic range)     HbA1c, POC (controlled diabetic range)      Diabetic Foot Exam:     PHQ2/9:    03/11/2024    1:27 PM 01/17/2024    1:21 PM 11/02/2023   11:27 AM 09/07/2023   11:36 AM 06/07/2023    2:14 PM  Depression screen PHQ 2/9  Decreased Interest 1 0 0 0 0  Down, Depressed, Hopeless 1 0 0 0 0  PHQ - 2 Score 2 0 0 0 0  Altered sleeping 0 0 1 1 0  Tired, decreased energy 1 0 1 0 0  Change in appetite 0 0 0 0 0  Feeling bad or failure about yourself  0 0 0 0 0  Trouble concentrating 0 0 0 0 0  Moving slowly or fidgety/restless 0 0 0 0 0  Suicidal thoughts 0 0 0 0 0  PHQ-9 Score 3 0 2 1 0  Difficult doing work/chores Somewhat difficult Not difficult at all Not difficult at all Not difficult at all Not difficult at all    phq 9 is positive  Fall Risk:    01/17/2024    1:21 PM 11/02/2023   11:26 AM 09/07/2023  11:36 AM 06/07/2023    2:14 PM 05/14/2023   11:08 AM  Fall Risk    Falls in the past year? 0 0 0 0 0  Number falls in past yr: 0   0   Injury with Fall? 0   0   Risk for fall due to : No Fall Risks No Fall Risks No Fall Risks No Fall Risks   Follow up Falls prevention discussed;Education provided;Falls evaluation completed Falls prevention discussed Falls prevention discussed Falls prevention discussed;Education provided;Falls evaluation completed      Assessment & Plan  1. Type 2 diabetes mellitus with stage 3 chronic kidney disease and hypertension (HCC) (Primary)  - POCT glycosylated hemoglobin (Hb A1C) - Urine Microalbumin w/creat. ratio - losartan-hydrochlorothiazide (HYZAAR) 50-12.5 MG tablet; Take 1 tablet by mouth daily.  Dispense: 90 tablet; Refill: 1 - simvastatin (ZOCOR) 40 MG tablet; Take 1 tablet (40 mg total) by mouth every evening.  Dispense: 90 tablet; Refill: 1  2. Stage 3a chronic kidney disease (HCC)  - Urine Microalbumin w/creat. ratio - losartan-hydrochlorothiazide (HYZAAR) 50-12.5 MG tablet; Take 1 tablet by mouth daily.  Dispense: 90 tablet; Refill: 1  3. [name] deficiency  - cyanocobalamin (VITAMIN [name]) injection 1,000 mcg  4. Adult hypothyroidism  - TSH - levothyroxine (SYNTHROID) 75 MCG tablet; Take 1 tablet (75 mcg total) by mouth daily before breakfast. Except for Sunday take half pill  Dispense: 90 tablet; Refill: 0  5. GAD (generalized anxiety disorder)  - citalopram (CELEXA) 20 MG tablet; Take 1 tablet (20 mg total) by mouth daily.  Dispense: 90 tablet; Refill: 1 - busPIRone (BUSPAR) 5 MG tablet; Take 1 tablet (5 mg total) by mouth 3 (three) times daily as needed.  Dispense: 180 tablet; Refill: 0  6. Gastro-esophageal reflux disease without esophagitis  - pantoprazole (PROTONIX) 40 MG tablet; Take 1 tablet (40 mg total) by mouth daily.  Dispense: 90 tablet; Refill: 1  7. Dyslipidemia  - Lipid panel - simvastatin (ZOCOR) 40 MG tablet; Take 1 tablet (40 mg total) by mouth every evening.  Dispense: 90 tablet;  Refill: 1  8. Insomnia, persistent  - zolpidem (AMBIEN) 5 MG tablet; Take 1 tablet (5 mg total) by mouth at bedtime.  Dispense: 90 tablet; Refill: 0  9. Perennial allergic rhinitis with seasonal variation  - levocetirizine (XYZAL) 5 MG tablet; Take 1 tablet (5 mg total) by mouth every evening.  Dispense: 90 tablet; Refill: 1  10. Benign hypertension  - losartan-hydrochlorothiazide (HYZAAR) 50-12.5 MG tablet; Take 1 tablet by mouth daily.  Dispense: 90 tablet; Refill: 1

## 2024-03-12 NOTE — Addendum Note (Signed)
 Addended by: Alba Cory F on: 03/12/2024 12:44 PM   Modules accepted: Level of Service

## 2024-03-13 DIAGNOSIS — M810 Age-related osteoporosis without current pathological fracture: Secondary | ICD-10-CM | POA: Diagnosis not present

## 2024-03-26 ENCOUNTER — Other Ambulatory Visit: Payer: Self-pay | Admitting: Family Medicine

## 2024-03-26 DIAGNOSIS — G4700 Insomnia, unspecified: Secondary | ICD-10-CM

## 2024-03-26 NOTE — Telephone Encounter (Signed)
 Copied from CRM [id number]. Topic: Clinical - Medication Refill >> Mar 26, 2024  9:20 AM Izetta Dakin wrote: Most Recent Primary Care Visit:  Provider: Alba Cory  Department: [facility]  Visit Type: OFFICE VISIT  Date: 03/11/2024  Medication:  zolpidem (AMBIEN) 5 MG tablet, 90 day supply  Has the patient contacted their pharmacy? No (Agent: If no, request that the patient contact the pharmacy for the refill. If patient does not wish to contact the pharmacy document the reason why and proceed with request.) (Agent: If yes, when and what did the pharmacy advise?)  Is this the correct pharmacy for this prescription? Yes If no, delete pharmacy and type the correct one.  This is the patient's preferred pharmacy:  [facility] - [place], [place] - [address] [place] [address] Phone: [phone] Fax: [phone]    Has the prescription been filled recently? Yes  Is the patient out of the medication? Yes  Has the patient been seen for an appointment in the last year OR does the patient have an upcoming appointment? Yes  Can we respond through MyChart? No  Agent: Please be advised that Rx refills may take up to 3 business days. We ask that you follow-up with your pharmacy.

## 2024-03-27 NOTE — Telephone Encounter (Signed)
 Requested medication (s) are due for refill today - no  Requested medication (s) are on the active medication list -yes  Future visit scheduled -yes  Last refill: 03/11/24 #90  Notes to clinic: non delegated Rx  Requested Prescriptions  Pending Prescriptions Disp Refills   zolpidem (AMBIEN) 5 MG tablet 90 tablet 0    Sig: Take 1 tablet (5 mg total) by mouth at bedtime.     Not Delegated - Psychiatry:  Anxiolytics/Hypnotics Failed - 03/27/2024 11:04 AM      Failed - This refill cannot be delegated      Failed - Urine Drug Screen completed in last 360 days      Passed - Valid encounter within last 6 months    Recent Outpatient Visits           2 weeks ago Type 2 diabetes mellitus with stage 3 chronic kidney disease and hypertension [facility])   [place] [facility] Alba Cory, MD       Future Appointments             In 3 months Alba Cory, MD [facility], [facility]               Requested Prescriptions  Pending Prescriptions Disp Refills   zolpidem (AMBIEN) 5 MG tablet 90 tablet 0    Sig: Take 1 tablet (5 mg total) by mouth at bedtime.     Not Delegated - Psychiatry:  Anxiolytics/Hypnotics Failed - 03/27/2024 11:04 AM      Failed - This refill cannot be delegated      Failed - Urine Drug Screen completed in last 360 days      Passed - Valid encounter within last 6 months    Recent Outpatient Visits           2 weeks ago Type 2 diabetes mellitus with stage 3 chronic kidney disease and hypertension [facility])   [place] [facility] Alba Cory, MD       Future Appointments             In 3 months Carlynn Purl, Danna Hefty, MD [facility], [facility]

## 2024-04-02 DIAGNOSIS — H401122 Primary open-angle glaucoma, left eye, moderate stage: Secondary | ICD-10-CM | POA: Diagnosis not present

## 2024-04-04 DIAGNOSIS — M7918 Myalgia, other site: Secondary | ICD-10-CM | POA: Diagnosis not present

## 2024-04-04 DIAGNOSIS — G44221 Chronic tension-type headache, intractable: Secondary | ICD-10-CM | POA: Diagnosis not present

## 2024-04-04 DIAGNOSIS — M5481 Occipital neuralgia: Secondary | ICD-10-CM | POA: Diagnosis not present

## 2024-04-04 DIAGNOSIS — G8929 Other chronic pain: Secondary | ICD-10-CM | POA: Diagnosis not present

## 2024-04-04 DIAGNOSIS — M545 Low back pain, unspecified: Secondary | ICD-10-CM | POA: Diagnosis not present

## 2024-04-05 DIAGNOSIS — E1122 Type 2 diabetes mellitus with diabetic chronic kidney disease: Secondary | ICD-10-CM | POA: Diagnosis not present

## 2024-04-05 DIAGNOSIS — N1831 Chronic kidney disease, stage 3a: Secondary | ICD-10-CM | POA: Diagnosis not present

## 2024-04-05 DIAGNOSIS — N183 Chronic kidney disease, stage 3 unspecified: Secondary | ICD-10-CM | POA: Diagnosis not present

## 2024-04-05 DIAGNOSIS — E039 Hypothyroidism, unspecified: Secondary | ICD-10-CM | POA: Diagnosis not present

## 2024-04-05 DIAGNOSIS — I129 Hypertensive chronic kidney disease with stage 1 through stage 4 chronic kidney disease, or unspecified chronic kidney disease: Secondary | ICD-10-CM | POA: Diagnosis not present

## 2024-04-05 DIAGNOSIS — E785 Hyperlipidemia, unspecified: Secondary | ICD-10-CM | POA: Diagnosis not present

## 2024-04-06 LAB — LIPID PANEL
Cholesterol: 148 mg/dL
HDL: 48 mg/dL — ABNORMAL LOW
LDL Cholesterol (Calc): 71 mg/dL
Non-HDL Cholesterol (Calc): 100 mg/dL
Total CHOL/HDL Ratio: 3.1 (calc)
Triglycerides: 195 mg/dL — ABNORMAL HIGH

## 2024-04-06 LAB — MICROALBUMIN / CREATININE URINE RATIO
Creatinine, Urine: 116 mg/dL (ref 20–275)
Microalb Creat Ratio: 3 mg/g{creat}
Microalb, Ur: 0.4 mg/dL

## 2024-04-06 LAB — TSH: TSH: 3.28 m[IU]/L (ref 0.40–4.50)

## 2024-04-08 ENCOUNTER — Encounter: Payer: Self-pay | Admitting: Family Medicine

## 2024-04-09 DIAGNOSIS — H2513 Age-related nuclear cataract, bilateral: Secondary | ICD-10-CM | POA: Diagnosis not present

## 2024-04-09 DIAGNOSIS — H402213 Chronic angle-closure glaucoma, right eye, severe stage: Secondary | ICD-10-CM | POA: Diagnosis not present

## 2024-04-09 DIAGNOSIS — E119 Type 2 diabetes mellitus without complications: Secondary | ICD-10-CM | POA: Diagnosis not present

## 2024-04-09 DIAGNOSIS — H401122 Primary open-angle glaucoma, left eye, moderate stage: Secondary | ICD-10-CM | POA: Diagnosis not present

## 2024-04-09 LAB — HM DIABETES EYE EXAM

## 2024-04-16 DIAGNOSIS — M5481 Occipital neuralgia: Secondary | ICD-10-CM | POA: Diagnosis not present

## 2024-04-16 DIAGNOSIS — M7918 Myalgia, other site: Secondary | ICD-10-CM | POA: Diagnosis not present

## 2024-05-03 DIAGNOSIS — H16122 Filamentary keratitis, left eye: Secondary | ICD-10-CM | POA: Diagnosis not present

## 2024-05-09 ENCOUNTER — Encounter: Payer: Self-pay | Admitting: Family Medicine

## 2024-05-14 ENCOUNTER — Encounter: Payer: Self-pay | Admitting: Family Medicine

## 2024-05-14 ENCOUNTER — Ambulatory Visit: Admitting: Family Medicine

## 2024-05-14 VITALS — BP 128/76 | HR 81 | Resp 16 | Ht 65.0 in | Wt 173.5 lb

## 2024-05-14 DIAGNOSIS — H401122 Primary open-angle glaucoma, left eye, moderate stage: Secondary | ICD-10-CM | POA: Diagnosis not present

## 2024-05-14 DIAGNOSIS — F411 Generalized anxiety disorder: Secondary | ICD-10-CM

## 2024-05-14 DIAGNOSIS — H2513 Age-related nuclear cataract, bilateral: Secondary | ICD-10-CM | POA: Diagnosis not present

## 2024-05-14 DIAGNOSIS — E538 Deficiency of other specified B group vitamins: Secondary | ICD-10-CM | POA: Diagnosis not present

## 2024-05-14 DIAGNOSIS — I129 Hypertensive chronic kidney disease with stage 1 through stage 4 chronic kidney disease, or unspecified chronic kidney disease: Secondary | ICD-10-CM | POA: Diagnosis not present

## 2024-05-14 DIAGNOSIS — E1122 Type 2 diabetes mellitus with diabetic chronic kidney disease: Secondary | ICD-10-CM | POA: Diagnosis not present

## 2024-05-14 DIAGNOSIS — N183 Chronic kidney disease, stage 3 unspecified: Secondary | ICD-10-CM | POA: Diagnosis not present

## 2024-05-14 DIAGNOSIS — H402213 Chronic angle-closure glaucoma, right eye, severe stage: Secondary | ICD-10-CM | POA: Diagnosis not present

## 2024-05-14 MED ORDER — ALPRAZOLAM 0.25 MG PO TABS: 0.2500 mg | ORAL_TABLET | Freq: Every day | ORAL | 0 refills | Status: DC | PRN

## 2024-05-14 MED ORDER — EMPAGLIFLOZIN 25 MG PO TABS: 25.0000 mg | ORAL_TABLET | Freq: Every day | ORAL | 1 refills | Status: DC

## 2024-05-14 MED ORDER — CYANOCOBALAMIN 1000 MCG/ML IJ SOLN
1000.0000 ug | Freq: Once | INTRAMUSCULAR | Status: AC
  Administered 2024-05-14: 1000 ug via INTRAMUSCULAR

## 2024-05-14 MED ORDER — BUSPIRONE HCL 10 MG PO TABS: 10.0000 mg | ORAL_TABLET | Freq: Three times a day (TID) | ORAL | 0 refills | Status: DC | PRN

## 2024-05-14 NOTE — Progress Notes (Signed)
 Name: Dawn Fuentes   MRN: [id number]    DOB: 11/27/1946   Date:05/14/2024       Progress Note  Subjective  Chief Complaint  Chief Complaint  Patient presents with   Anxiety    Anxious when pt is hungry   Discussed the use of AI scribe software for clinical note transcription with the patient, who gave verbal consent to proceed.  History of Present Illness Dawn Fuentes is a 78 year old female with diet-controlled diabetes who presents with anxiety related to her blood sugar levels.  She experiences anxiety related to her blood sugar levels, particularly when feeling hungry. This anxiety occurs around 11:30 AM before lunch and again around 4:00 PM, sometimes persisting even after eating. She monitors her blood sugar levels and noted a reading of 188 mg/dL after a meal, which caused concern. However, fasting blood sugar levels have been below 140 mg/dL and postprandial levels have been below 180 mg/dL.  Her hemoglobin A1c has fluctuated over the years, with a recent reading of 6.1% in March 2025, down from 6.6% in October 2024. She manages her diabetes through dietary changes, which have been effective in maintaining her A1c levels within a reasonable range. She has seen a dietician No excessive hunger or thirst typically associated with diabetes, aside from her anxiety and hunger-related nervousness. Discussed starting her on medication for DM to see if it would decrease her anxiety     She is currently taking simvastatin  for hyperlipidemia and citalopram  for anxiety. She also uses Buspar , which she is considering increasing from 5 mg to 10 mg, and occasionally takes Xanax  for acute anxiety episodes.      Patient Active Problem List   Diagnosis Date Noted   Type 2 diabetes mellitus with stage 3 chronic kidney disease and hypertension (HCC) 03/11/2024   Diabetes mellitus, new onset (HCC) 11/02/2023   Occipital neuralgia of right side 09/07/2023   Perennial allergic rhinitis with  seasonal variation 09/07/2023   Lichen simplex 03/08/2022   Chronic nonintractable headache 07/06/2021   Cervicogenic migraine 01/14/2021   Cervical facet joint syndrome 01/14/2021   Cervical radiculitis 12/01/2020   Bilateral hip pain 12/01/2020   Chronic bilateral low back pain without sciatica 12/01/2020   Chronic SI joint pain 12/01/2020   Chronic pain syndrome 12/01/2020   Body mass index (BMI) 30.0-30.9, adult 11/18/2020   Essential (primary) hypertension 11/18/2020   Cervical radicular pain 11/18/2020   Degeneration of cervical intervertebral disc 06/25/2020   Lichen sclerosus et atrophicus of the vulva 11/04/2019   [name] deficiency 12/26/2018   Osteoarthritis of knees, bilateral 05/26/2017   Primary open angle glaucoma of both eyes 11/21/2016   Chronic kidney disease (CKD), stage III (moderate) (HCC) 04/06/2016   Cystitis 10/12/2015   GAD (generalized anxiety disorder) 08/21/2015   Benign hypertension 08/21/2015   Insomnia, persistent 08/21/2015   Dyslipidemia 08/21/2015   Gastro-esophageal reflux disease without esophagitis 08/21/2015   Glaucoma 08/21/2015   Blood glucose elevated 08/21/2015   Adult hypothyroidism 08/21/2015   Climacteric 08/21/2015   Senile osteoporosis 08/21/2015   Seasonal allergies 08/21/2015   Female genuine stress incontinence 08/21/2015   Vitamin D  deficiency 08/21/2015    Past Surgical History:  Procedure Laterality Date   ABDOMINAL HYSTERECTOMY     21 years ago   APPENDECTOMY     COLONOSCOPY N/A 09/14/2020   Procedure: COLONOSCOPY;  Surgeon: Shane Darling, MD;  Location: [facility] ENDOSCOPY;  Service: Endoscopy;  Laterality: N/A;   COLONOSCOPY WITH PROPOFOL  N/A  05/29/2015   Procedure: COLONOSCOPY WITH PROPOFOL ;  Surgeon: Stephens Eis, MD;  Location: [facility] ENDOSCOPY;  Service: Gastroenterology;  Laterality: N/A;   ESOPHAGOGASTRODUODENOSCOPY N/A 05/29/2015   Procedure: ESOPHAGOGASTRODUODENOSCOPY (EGD);  Surgeon: Stephens Eis, MD;  Location: [facility]  ENDOSCOPY;  Service: Gastroenterology;  Laterality: N/A;   ESOPHAGOGASTRODUODENOSCOPY N/A 09/14/2020   Procedure: ESOPHAGOGASTRODUODENOSCOPY (EGD);  Surgeon: Shane Darling, MD;  Location: [facility] ENDOSCOPY;  Service: Endoscopy;  Laterality: N/A;   EYE SURGERY     eye lids   OVARIAN CYST REMOVAL     needed transfusion   PAROTIDECTOMY Right 05/05/2021   Procedure: SUPERFICIAL PAROTIDECTOMY;  Surgeon: Von Grumbling, MD;  Location: [facility] ORS;  Service: ENT;  Laterality: Right;   PHOTOCOAGULATION WITH LASER Right 11/21/2016   Procedure: PHOTOCOAGULATION WITH LASER;  Surgeon: Billee Buddle, MD;  Location: [facility];  Service: Ophthalmology;  Laterality: Right;  A micropulse probe was applied to each hemilimbus with the following settings: , 31.3% duty cycle, 90 seconds x 3 applications.    TONSILLECTOMY      Family History  Problem Relation Age of Onset   Hypertension Mother    Osteoporosis Mother    COPD Mother    Dementia Mother    Cancer Father        Colon   Hypothyroidism Daughter    Aneurysm Brother    COPD Sister     Social History   Tobacco Use   Smoking status: Never   Smokeless tobacco: Never   Tobacco comments:    smoking cessation materials not required  Substance Use Topics   Alcohol use: No    Alcohol/week: 0.0 standard drinks of alcohol     Current Outpatient Medications:    ALPRAZolam  (XANAX ) 0.25 MG tablet, Take 1 tablet (0.25 mg total) by mouth daily as needed for anxiety. Rarely, Disp: 10 tablet, Rfl: 0   baclofen  (LIORESAL ) 10 MG tablet, Take 1 tablet (10 mg total) by mouth 2 (two) times daily., Disp: 180 each, Rfl: 1   busPIRone  (BUSPAR ) 5 MG tablet, Take 1 tablet (5 mg total) by mouth 3 (three) times daily as needed., Disp: 180 tablet, Rfl: 0   Calcium Carb-Cholecalciferol 600-800 MG-UNIT TABS, Take 1 tablet by mouth daily., Disp: , Rfl:    citalopram  (CELEXA ) 20 MG tablet, Take 1 tablet (20 mg total) by mouth daily., Disp: 90  tablet, Rfl: 1   clobetasol  ointment (TEMOVATE ) 0.05 %, Apply 1 application. topically 2 (two) times a week., Disp: 30 g, Rfl: 2   famotidine (PEPCID) 40 MG tablet, Take 1 tablet by mouth at bedtime., Disp: , Rfl:    hydrocortisone (ANUSOL-HC) 2.5 % rectal cream, Apply topically., Disp: , Rfl:    levocetirizine (XYZAL ) 5 MG tablet, Take 1 tablet (5 mg total) by mouth every evening., Disp: 90 tablet, Rfl: 1   levothyroxine  (SYNTHROID ) 75 MCG tablet, Take 1 tablet (75 mcg total) by mouth daily before breakfast. Except for Sunday take half pill, Disp: 90 tablet, Rfl: 0   losartan -hydrochlorothiazide  (HYZAAR) 50-12.5 MG tablet, Take 1 tablet by mouth daily., Disp: 90 tablet, Rfl: 1   pantoprazole  (PROTONIX ) 40 MG tablet, Take 1 tablet (40 mg total) by mouth daily., Disp: 90 tablet, Rfl: 1   propranolol ER (INDERAL LA) 80 MG 24 hr capsule, , Disp: , Rfl:    simvastatin  (ZOCOR ) 40 MG tablet, Take 1 tablet (40 mg total) by mouth every evening., Disp: 90 tablet, Rfl: 1   timolol (TIMOPTIC) 0.5 % ophthalmic solution, Place  1 drop into both eyes 2 (two) times daily., Disp: , Rfl: 5   Turmeric 500 MG TABS, Take 500 mg by mouth in the morning and at bedtime., Disp: , Rfl:    VYZULTA 0.024 % SOLN, Place 1 drop into both eyes at bedtime., Disp: , Rfl: 2   zolpidem  (AMBIEN ) 5 MG tablet, Take 1 tablet (5 mg total) by mouth at bedtime., Disp: 90 tablet, Rfl: 0  Allergies  Allergen Reactions   Brimonidine Tartrate Other (See Comments)    Burning in eyes   Latanoprost Other (See Comments)    burning   Meloxicam      Increased blood pressure    Raloxifene     bone pain    I personally reviewed active problem list, medication list, allergies, family history with the patient/caregiver today.   ROS  Ten systems reviewed and is negative except as mentioned in HPI    Objective Physical Exam Constitutional: Patient appears well-developed and well-nourished. Obese  No distress.  HEENT: head atraumatic,  normocephalic, pupils equal and reactive to light, neck supple Cardiovascular: Normal rate, regular rhythm and normal heart sounds.  No murmur heard. No BLE edema. Pulmonary/Chest: Effort normal and breath sounds normal. No respiratory distress. Abdominal: Soft.  There is no tenderness. Psychiatric: Patient has a normal mood and affect. behavior is normal. Judgment and thought content normal.   Vitals:   05/14/24 1121  BP: 128/76  Pulse: 81  Resp: 16  SpO2: 95%  Weight: 173 lb 8 oz (78.7 kg)  Height: 5\' 5"  (1.651 m)    Body mass index is 28.87 kg/m.  Recent Results (from the past 2160 hours)  POCT glycosylated hemoglobin (Hb A1C)     Status: Abnormal   Collection Time: 03/11/24  1:33 PM  Result Value Ref Range   Hemoglobin A1C 6.1 (A) 4.0 - 5.6 %   HbA1c POC (<> result, manual entry)     HbA1c, POC (prediabetic range)     HbA1c, POC (controlled diabetic range)    Urine Microalbumin w/creat. ratio     Status: None   Collection Time: 04/05/24  1:42 PM  Result Value Ref Range   Creatinine, Urine 116 20 - 275 mg/dL   Microalb, Ur 0.4 mg/dL    Comment: Reference Range Not established    Microalb Creat Ratio 3 <30 mg/g creat    Comment: . The ADA defines abnormalities in albumin excretion as follows: Aaron Aas Albuminuria Category        Result (mg/g creatinine) . Normal to Mildly increased   <30 Moderately increased         30-299  Severely increased           > OR = 300 . The ADA recommends that at least two of three specimens collected within a 3-6 month period be abnormal before considering a patient to be within a diagnostic category.   Lipid panel     Status: Abnormal   Collection Time: 04/05/24  1:42 PM  Result Value Ref Range   Cholesterol 148 <200 mg/dL   HDL 48 (L) > OR = 50 mg/dL   Triglycerides 161 (H) <150 mg/dL   LDL Cholesterol (Calc) 71 mg/dL (calc)    Comment: Reference range: <100 . Desirable range <100 mg/dL for primary prevention;   <70 mg/dL for  patients with CHD or diabetic patients  with > or = 2 CHD risk factors. Aaron Aas LDL-C is now calculated using the Martin-Hopkins  calculation, which is a validated novel  method providing  better accuracy than the Friedewald equation in the  estimation of LDL-C.  Melinda Sprawls et al. Erroll Heard. 1610;960(45): 2061-2068  (http://education.[facility].com/faq/FAQ164)    Total CHOL/HDL Ratio 3.1 <5.0 (calc)   Non-HDL Cholesterol (Calc) 100 <130 mg/dL (calc)    Comment: For patients with diabetes plus 1 major ASCVD risk  factor, treating to a non-HDL-C goal of <100 mg/dL  (LDL-C of <40 mg/dL) is considered a therapeutic  option.   TSH     Status: None   Collection Time: 04/05/24  1:42 PM  Result Value Ref Range   TSH 3.28 0.40 - 4.50 mIU/L  HM DIABETES EYE EXAM     Status: None   Collection Time: 04/09/24  3:13 PM  Result Value Ref Range   HM Diabetic Eye Exam No Retinopathy No Retinopathy    Comment: ABS BY HIM    Diabetic Foot Exam:  Diabetic foot exam was performed with the following findings:   No deformities, ulcerations, or other skin breakdown Normal sensation of 10g monofilament Intact posterior tibialis and dorsalis pedis pulses      PHQ2/9:    05/14/2024   11:20 AM 03/11/2024    1:27 PM 01/17/2024    1:21 PM 11/02/2023   11:27 AM 09/07/2023   11:36 AM  Depression screen PHQ 2/9  Decreased Interest 0 1 0 0 0  Down, Depressed, Hopeless 0 1 0 0 0  PHQ - 2 Score 0 2 0 0 0  Altered sleeping 0 0 0 1 1  Tired, decreased energy 0 1 0 1 0  Change in appetite 0 0 0 0 0  Feeling bad or failure about yourself  0 0 0 0 0  Trouble concentrating 0 0 0 0 0  Moving slowly or fidgety/restless 0 0 0 0 0  Suicidal thoughts 0 0 0 0 0  PHQ-9 Score 0 3 0 2 1  Difficult doing work/chores Not difficult at all Somewhat difficult Not difficult at all Not difficult at all Not difficult at all    phq 9 is negative  Fall Risk:    05/14/2024   11:17 AM 01/17/2024    1:21 PM 11/02/2023   11:26  AM 09/07/2023   11:36 AM 06/07/2023    2:14 PM  Fall Risk   Falls in the past year? 0 0 0 0 0  Number falls in past yr: 0 0   0  Injury with Fall? 0 0   0  Risk for fall due to : No Fall Risks No Fall Risks No Fall Risks No Fall Risks No Fall Risks  Follow up Falls prevention discussed;Education provided;Falls evaluation completed Falls prevention discussed;Education provided;Falls evaluation completed Falls prevention discussed Falls prevention discussed Falls prevention discussed;Education provided;Falls evaluation completed      Assessment & Plan Type 2 diabetes mellitus with dyslipidemia and CKI stage IIIa Diet-controlled with A1c of 6.1. Blood glucose well-controlled. Jardiance preferred for kidney protection and blood sugar management. - Start Jardiance for diabetes management and kidney protection. - Monitor blood glucose levels, especially during anxiety, to assess correlation with blood sugar. - Educate on postprandial blood sugar checks. - Consult dietitian if dietary guidance needed.  Chronic kidney disease, stage 3 Stage 3 with kidney function at 67. Jardiance chosen for kidney protection. - Consider nephrologist referral if kidney function declines.  Anxiety disorder Symptoms linked to diabetes management concerns. Buspar  and citalopram  prescribed. Buspar  dosage increase discussed. - Increase Buspar  from 5 mg to 10 mg three times daily. -  Prescribe Xanax  with plan to minimize use. - Continue citalopram .  Dyslipidemia  Triglycerides at 195, HDL at 48. On simvastatin  without side effects. - Continue simvastatin .

## 2024-05-23 ENCOUNTER — Ambulatory Visit: Payer: PPO

## 2024-05-23 DIAGNOSIS — Z0000 Encounter for general adult medical examination without abnormal findings: Secondary | ICD-10-CM | POA: Diagnosis not present

## 2024-05-23 NOTE — Progress Notes (Signed)
 Subjective:   Dawn Fuentes is a 78 y.o. who presents for a Medicare Wellness preventive visit.  As a reminder, Annual Wellness Visits don't include a physical exam, and some assessments may be limited, especially if this visit is performed virtually. We may recommend an in-person follow-up visit with your provider if needed.  Visit Complete: Virtual I connected with  Dawn Fuentes on 05/23/24 by a audio enabled telemedicine application and verified that I am speaking with the correct person using two identifiers.  Patient Location: Home  Provider Location: Office/Clinic  I discussed the limitations of evaluation and management by telemedicine. The patient expressed understanding and agreed to proceed.  Vital Signs: Because this visit was a virtual/telehealth visit, some criteria may be missing or patient reported. Any vitals not documented were not able to be obtained and vitals that have been documented are patient reported.  VideoDeclined- This patient declined Librarian, academic. Therefore the visit was completed with audio only.  Persons Participating in Visit: Patient.  AWV Questionnaire: No: Patient Medicare AWV questionnaire was not completed prior to this visit.  Cardiac Risk Factors include: advanced age (>43men, >55 women);dyslipidemia;diabetes mellitus;hypertension;sedentary lifestyle     Objective:     There were no vitals filed for this visit. There is no height or weight on file to calculate BMI.     05/23/2024   11:02 AM 05/18/2023   10:28 AM 10/16/2021    4:00 PM 09/16/2021    9:36 AM 07/06/2021    1:46 PM 05/05/2021    7:27 AM 04/30/2021   11:59 AM  Advanced Directives  Does Patient Have a Medical Advance Directive? No Yes No Yes No No Yes  Type of Furniture conservator/restorer;Living will  Healthcare Power of [place];Living will   Healthcare Power of [place];Living will  Copy of Healthcare Power of Attorney  in Chart?  No - copy requested  No - copy requested  No - copy requested No - copy requested  Would patient like information on creating a medical advance directive? No - Patient declined     No - Patient declined     Current Medications (verified) Outpatient Encounter Medications as of 05/23/2024  Medication Sig   ALPRAZolam  (XANAX ) 0.25 MG tablet Take 1 tablet (0.25 mg total) by mouth daily as needed for anxiety. Rarely   baclofen  (LIORESAL ) 10 MG tablet Take 1 tablet (10 mg total) by mouth 2 (two) times daily.   busPIRone  (BUSPAR ) 10 MG tablet Take 1 tablet (10 mg total) by mouth 3 (three) times daily as needed.   Calcium Carb-Cholecalciferol 600-800 MG-UNIT TABS Take 1 tablet by mouth daily.   citalopram  (CELEXA ) 20 MG tablet Take 1 tablet (20 mg total) by mouth daily.   clobetasol  ointment (TEMOVATE ) 0.05 % Apply 1 application. topically 2 (two) times a week.   empagliflozin  (JARDIANCE ) 25 MG TABS tablet Take 1 tablet (25 mg total) by mouth daily before breakfast.   famotidine (PEPCID) 40 MG tablet Take 1 tablet by mouth at bedtime.   hydrocortisone (ANUSOL-HC) 2.5 % rectal cream Apply topically.   levocetirizine (XYZAL ) 5 MG tablet Take 1 tablet (5 mg total) by mouth every evening.   levothyroxine  (SYNTHROID ) 75 MCG tablet Take 1 tablet (75 mcg total) by mouth daily before breakfast. Except for Sunday take half pill   losartan -hydrochlorothiazide  (HYZAAR) 50-12.5 MG tablet Take 1 tablet by mouth daily.   pantoprazole  (PROTONIX ) 40 MG tablet Take 1 tablet (40 mg total)  by mouth daily.   propranolol ER (INDERAL LA) 80 MG 24 hr capsule    simvastatin  (ZOCOR ) 40 MG tablet Take 1 tablet (40 mg total) by mouth every evening.   timolol (TIMOPTIC) 0.5 % ophthalmic solution Place 1 drop into both eyes 2 (two) times daily.   Turmeric 500 MG TABS Take 500 mg by mouth in the morning and at bedtime.   VYZULTA 0.024 % SOLN Place 1 drop into both eyes at bedtime.   zolpidem  (AMBIEN ) 5 MG tablet Take 1  tablet (5 mg total) by mouth at bedtime.   No facility-administered encounter medications on file as of 05/23/2024.    Allergies (verified) Brimonidine tartrate, Latanoprost, Meloxicam , and Raloxifene   History: Past Medical History:  Diagnosis Date   Allergy    Anxiety    ER visit in Sept 2017 for anxiety   Arthritis    Benign parotid tumor    Chronic insomnia    Colon adenoma    Dyslipidemia    Dysphagia    Female stress incontinence    GERD (gastroesophageal reflux disease)    Glaucoma, both eyes    Dr. Ignatius Makos- [facility]   Headache    Hyperlipidemia    Hypertension    controlled on meds   Hypothyroidism    Menopause syndrome    Osteoporosis    Polyp of colon    Renal insufficiency    mild- keeping an eye on   Vitamin D  deficiency    Past Surgical History:  Procedure Laterality Date   ABDOMINAL HYSTERECTOMY     21 years ago   APPENDECTOMY     COLONOSCOPY N/A 09/14/2020   Procedure: COLONOSCOPY;  Surgeon: Shane Darling, MD;  Location: [facility] ENDOSCOPY;  Service: Endoscopy;  Laterality: N/A;   COLONOSCOPY WITH PROPOFOL  N/A 05/29/2015   Procedure: COLONOSCOPY WITH PROPOFOL ;  Surgeon: Stephens Eis, MD;  Location: [facility] ENDOSCOPY;  Service: Gastroenterology;  Laterality: N/A;   ESOPHAGOGASTRODUODENOSCOPY N/A 05/29/2015   Procedure: ESOPHAGOGASTRODUODENOSCOPY (EGD);  Surgeon: Stephens Eis, MD;  Location: [facility] ENDOSCOPY;  Service: Gastroenterology;  Laterality: N/A;   ESOPHAGOGASTRODUODENOSCOPY N/A 09/14/2020   Procedure: ESOPHAGOGASTRODUODENOSCOPY (EGD);  Surgeon: Shane Darling, MD;  Location: [facility] ENDOSCOPY;  Service: Endoscopy;  Laterality: N/A;   EYE SURGERY     eye lids   OVARIAN CYST REMOVAL     needed transfusion   PAROTIDECTOMY Right 05/05/2021   Procedure: SUPERFICIAL PAROTIDECTOMY;  Surgeon: Von Grumbling, MD;  Location: [facility] ORS;  Service: ENT;  Laterality: Right;   PHOTOCOAGULATION WITH LASER Right 11/21/2016   Procedure: PHOTOCOAGULATION WITH  LASER;  Surgeon: Billee Buddle, MD;  Location: [facility];  Service: Ophthalmology;  Laterality: Right;  A micropulse probe was applied to each hemilimbus with the following settings: , 31.3% duty cycle, 90 seconds x 3 applications.    TONSILLECTOMY     Family History  Problem Relation Age of Onset   Hypertension Mother    Osteoporosis Mother    COPD Mother    Dementia Mother    Cancer Father        Colon   Hypothyroidism Daughter    Aneurysm Brother    COPD Sister    Social History   Socioeconomic History   Marital status: Widowed    Spouse name: Myrtie Atkinson   Number of children: 1   Years of education: Not on file   Highest education level: 12th grade  Occupational History   Occupation: Retired  Tobacco Use  Smoking status: Never   Smokeless tobacco: Never   Tobacco comments:    smoking cessation materials not required  Vaping Use   Vaping status: Never Used  Substance and Sexual Activity   Alcohol use: No    Alcohol/week: 0.0 standard drinks of alcohol   Drug use: No   Sexual activity: Yes    Partners: Male    Birth control/protection: Post-menopausal  Other Topics Concern   Not on file  Social History Narrative   Widow since 2013   Started to date in 2019   Social Drivers of Health   Financial Resource Strain: Low Risk  (05/23/2024)   Overall Financial Resource Strain (CARDIA)    Difficulty of Paying Living Expenses: Not hard at all  Food Insecurity: No Food Insecurity (05/23/2024)   Hunger Vital Sign    Worried About Running Out of Food in the Last Year: Never true    Ran Out of Food in the Last Year: Never true  Transportation Needs: No Transportation Needs (05/23/2024)   PRAPARE - Administrator, Civil Service (Medical): No    Lack of Transportation (Non-Medical): No  Physical Activity: Insufficiently Active (05/23/2024)   Exercise Vital Sign    Days of Exercise per Week: 2 days    Minutes of Exercise per Session: 20 min   Stress: No Stress Concern Present (05/23/2024)   [employer] of Occupational Health - Occupational Stress Questionnaire    Feeling of Stress : Not at all  Social Connections: Moderately Isolated (05/23/2024)   Social Connection and Isolation Panel [NHANES]    Frequency of Communication with Friends and Family: More than three times a week    Frequency of Social Gatherings with Friends and Family: More than three times a week    Attends Religious Services: More than 4 times per year    Active Member of [employer] or Organizations: No    Attends Banker Meetings: Never    Marital Status: Widowed    Tobacco Counseling Counseling given: Not Answered Tobacco comments: smoking cessation materials not required    Clinical Intake:  Pre-visit preparation completed: Yes  Pain : No/denies pain     BMI - recorded: 28.8 Nutritional Status: BMI 25 -29 Overweight Nutritional Risks: None Diabetes: Yes CBG done?: No Did pt. bring in CBG monitor from home?: No  Lab Results  Component Value Date   HGBA1C 6.1 (A) 03/11/2024   HGBA1C 6.6 (H) 09/29/2023   HGBA1C 6.0 (H) 03/29/2022     How often do you need to have someone help you when you read instructions, pamphlets, or other written materials from your doctor or pharmacy?: 1 - Never  Interpreter Needed?: No  Information entered by :: Dellie Fergusson, LPN   Activities of Daily Living    05/23/2024   11:03 AM 11/02/2023   11:27 AM  In your present state of health, do you have any difficulty performing the following activities:  Hearing? 0 0  Vision? 0 0  Difficulty concentrating or making decisions? 0 0  Walking or climbing stairs? 0 0  Dressing or bathing? 0 0  Doing errands, shopping? 0 0  Preparing Food and eating ? N   Using the Toilet? N   In the past six months, have you accidently leaked urine? N   Do you have problems with loss of bowel control? N   Managing your Medications? N   Managing your Finances? N    Housekeeping or managing your Housekeeping? N  Patient Care Team: Sowles, Krichna, MD as PCP - General (Family Medicine) Annell Kidney, MD as Referring Physician (Ophthalmology)  I have updated your Care Teams any recent Medical Services you may have received from other providers in the past year.     Assessment:    This is a routine wellness examination for Dawn Fuentes.  Hearing/Vision screen Hearing Screening - Comments:: NO AIDS Vision Screening - Comments:: WEARS GLASSES ALL DAY- HAS GLAUCOMA- DR.BRASINGTON   Goals Addressed             This Visit's Progress    DIET - EAT MORE FRUITS AND VEGETABLES         Depression Screen     05/23/2024   11:00 AM 05/14/2024   11:20 AM 03/11/2024    1:27 PM 01/17/2024    1:21 PM 11/02/2023   11:27 AM 09/07/2023   11:36 AM 06/07/2023    2:14 PM  PHQ 2/9 Scores  PHQ - 2 Score 0 0 2 0 0 0 0  PHQ- 9 Score 0 0 3 0 2 1 0    Fall Risk     05/23/2024   11:03 AM 05/14/2024   11:17 AM 01/17/2024    1:21 PM 11/02/2023   11:26 AM 09/07/2023   11:36 AM  Fall Risk   Falls in the past year? 0 0 0 0 0  Number falls in past yr: 0 0 0    Injury with Fall? 0 0 0    Risk for fall due to : No Fall Risks No Fall Risks No Fall Risks No Fall Risks No Fall Risks  Follow up Falls evaluation completed Falls prevention discussed;Education provided;Falls evaluation completed Falls prevention discussed;Education provided;Falls evaluation completed Falls prevention discussed Falls prevention discussed    MEDICARE RISK AT HOME:  Medicare Risk at Home Any stairs in or around the home?: No If so, are there any without handrails?: No Home free of loose throw rugs in walkways, pet beds, electrical cords, etc?: Yes Adequate lighting in your home to reduce risk of falls?: Yes Life alert?: No Use of a cane, walker or w/c?: No Grab bars in the bathroom?: Yes Shower chair or bench in shower?: No Elevated toilet seat or a handicapped toilet?: Yes  TIMED  UP AND GO:  Was the test performed?  No  Cognitive Function: 6CIT completed        05/23/2024   11:04 AM 05/18/2023   10:32 AM 08/29/2019   11:24 AM 03/13/2018   10:22 AM  6CIT Screen  What Year? 0 points 0 points 0 points 0 points  What month? 0 points 0 points 0 points 0 points  What time? 0 points 0 points 0 points 0 points  Count back from 20 0 points 0 points 0 points 0 points  Months in reverse 0 points 0 points 0 points 0 points  Repeat phrase 0 points 0 points 0 points 0 points  Total Score 0 points 0 points 0 points 0 points    Immunizations Immunization History  Administered Date(s) Administered   Fluad Quad(high Dose 65+) 08/23/2019, 09/04/2020, 09/10/2021, 09/07/2022   Fluad Trivalent(High Dose 65+) 09/29/2023   Influenza Split 10/02/2008, 08/18/2010, 09/20/2012   Influenza, High Dose Seasonal PF 08/25/2015, 09/07/2016, 09/06/2017, 09/11/2018   Influenza, Seasonal, Injecte, Preservative Fre 10/09/2014   Influenza-Unspecified 10/09/2014   PFIZER(Purple Top)SARS-COV-2 Vaccination 01/31/2020, 02/21/2020, 12/29/2020   Pneumococcal Conjugate-13 08/18/2010, 03/08/2017   Pneumococcal Polysaccharide-23 12/30/2013   Pneumococcal-Unspecified 08/18/2010   Td 08/18/2010  Tdap 08/18/2010   Zoster Recombinant(Shingrix) 07/12/2018, 11/20/2018   Zoster, Live 07/24/2012    Screening Tests Health Maintenance  Topic Date Due   FOOT EXAM  Never done   COVID-19 Vaccine (4 - 2024-25 season) 08/20/2023   INFLUENZA VACCINE  07/19/2024   HEMOGLOBIN A1C  09/11/2024   Diabetic kidney evaluation - eGFR measurement  09/28/2024   MAMMOGRAM  10/01/2024   Diabetic kidney evaluation - Urine ACR  04/05/2025   OPHTHALMOLOGY EXAM  04/09/2025   Medicare Annual Wellness (AWV)  05/23/2025   Colonoscopy  09/14/2025   Pneumonia Vaccine 17+ Years old  Completed   DEXA SCAN  Completed   Hepatitis C Screening  Completed   Zoster Vaccines- Shingrix  Completed   HPV VACCINES  Aged Out    Meningococcal B Vaccine  Aged Out   DTaP/Tdap/Td  Discontinued    Health Maintenance  Health Maintenance Due  Topic Date Due   FOOT EXAM  Never done   COVID-19 Vaccine (4 - 2024-25 season) 08/20/2023   Health Maintenance Items Addressed: DEXA scheduled in next few months;UP TO DATE ON MAMMOGRAM; UP TO DATE ON SHINGRIX, NEEDS TDAP & PNA; WANTS NO MORE COVIDS  Additional Screening:  Vision Screening: Recommended annual ophthalmology exams for early detection of glaucoma and other disorders of the eye. Would you like a referral to an eye doctor? No    Dental Screening: Recommended annual dental exams for proper oral hygiene  Community Resource Referral / Chronic Care Management: CRR required this visit?  No   CCM required this visit?  No   Plan:    I have personally reviewed and noted the following in the patient's chart:   Medical and social history Use of alcohol, tobacco or illicit drugs  Current medications and supplements including opioid prescriptions. Patient is not currently taking opioid prescriptions. Functional ability and status Nutritional status Physical activity Advanced directives List of other physicians Hospitalizations, surgeries, and ER visits in previous 12 months Vitals Screenings to include cognitive, depression, and falls Referrals and appointments  In addition, I have reviewed and discussed with patient certain preventive protocols, quality metrics, and best practice recommendations. A written personalized care plan for preventive services as well as general preventive health recommendations were provided to patient.   Pinky Bright, LPN   03/23/4097   After Visit Summary: (MyChart) Due to this being a telephonic visit, the after visit summary with patients personalized plan was offered to patient via MyChart   Notes: Nothing significant to report at this time.

## 2024-05-23 NOTE — Patient Instructions (Addendum)
 Ms. Menard , Thank you for taking time out of your busy schedule to complete your Annual Wellness Visit with me. I enjoyed our conversation and look forward to speaking with you again next year. I, as well as your care team,  appreciate your ongoing commitment to your health goals. Please review the following plan we discussed and let me know if I can assist you in the future.   Follow up Visits: Next Medicare AWV with our clinical staff:   06/05/25 @ 1:20 PM BY PHONE Have you seen your provider in the last 6 months (3 months if uncontrolled diabetes)? Yes  Clinician Recommendations:  Aim for 30 minutes of exercise or brisk walking, 6-8 glasses of water, and 5 servings of fruits and vegetables each day. TAKE CARE!      This is a list of the screening recommended for you and due dates:  Health Maintenance  Topic Date Due   Complete foot exam   Never done   COVID-19 Vaccine (4 - 2024-25 season) 08/20/2023   Flu Shot  07/19/2024   Hemoglobin A1C  09/11/2024   Yearly kidney function blood test for diabetes  09/28/2024   Mammogram  10/01/2024   Yearly kidney health urinalysis for diabetes  04/05/2025   Eye exam for diabetics  04/09/2025   Medicare Annual Wellness Visit  05/23/2025   Colon Cancer Screening  09/14/2025   Pneumonia Vaccine  Completed   DEXA scan (bone density measurement)  Completed   Hepatitis C Screening  Completed   Zoster (Shingles) Vaccine  Completed   HPV Vaccine  Aged Out   Meningitis B Vaccine  Aged Out   DTaP/Tdap/Td vaccine  Discontinued    Advanced directives: (ACP Link)Information on Advanced Care Planning can be found at [place]  Secretary of [facility] Advance Health Care Directives Advance Health Care Directives. [email or web address]  Advance Care Planning is important because it:  [x]  Makes sure you receive the medical care that is consistent with your values, goals, and preferences  [x]  It provides guidance to your family and loved ones and reduces their  decisional burden about whether or not they are making the right decisions based on your wishes.  Follow the link provided in your after visit summary or read over the paperwork we have mailed to you to help you started getting your Advance Directives in place. If you need assistance in completing these, please reach out to us  so that we can help you!

## 2024-06-24 ENCOUNTER — Other Ambulatory Visit: Payer: Self-pay | Admitting: Family Medicine

## 2024-06-24 DIAGNOSIS — G44221 Chronic tension-type headache, intractable: Secondary | ICD-10-CM | POA: Diagnosis not present

## 2024-06-24 DIAGNOSIS — G4700 Insomnia, unspecified: Secondary | ICD-10-CM

## 2024-06-24 DIAGNOSIS — M5481 Occipital neuralgia: Secondary | ICD-10-CM | POA: Diagnosis not present

## 2024-06-24 DIAGNOSIS — M5412 Radiculopathy, cervical region: Secondary | ICD-10-CM | POA: Diagnosis not present

## 2024-06-24 DIAGNOSIS — G939 Disorder of brain, unspecified: Secondary | ICD-10-CM | POA: Diagnosis not present

## 2024-06-24 NOTE — Progress Notes (Signed)
 Ref Provider: Glenard Dorette FALCON, MD PCP: Sowles, Krichna F, MD Assessment and Plan:   In most patients we give written parts of assessment and plan to patient under Patient Instructions/After Visit Summary. So some parts are directed to patient.  Dear [name], It was our pleasure to participate in your care. We have typed up brief summary of what we discussed.  Assessment & Plan Cervical radiculopathy + Right Occipital Neuralgia and myofascial pain syndrome Symptoms suggestive of cervical radiculopathy with pain radiating from the neck down the shoulder, consistent with C6 distribution. Previous MRI of the cervical spine showed arthritic changes at C5-C6. Differential diagnosis includes cervical myofascial pain, but symptoms align more with radiculopathy.  - Initiate physical Fuentes for cervical radiculopathy - @ Dawn Fuentes in [facility] Fuentes [phone]) [address], [place], [place] [address] - Schedule occipital nerve block and trigger point injections with Dr. Jannett Fuentes   Occipital Neuralgia is typically caused by the occipital nerve's involvement at the skull's base. It can cause intense, sharp, jabbing, electric shock-like pain in the back of the head and neck. The patient can radiate pain in other parts of the head and face. Arthritic changes, neck muscle tension/spasm, trauma, and other things can cause occipital neuralgia. Conservative measures such as heat application, rest, massage, over-the-counter medications, etc, can be helpful. Occipital nerve block can benefit patients when other interventions don't help.  Patient continues to have symptoms even after conservative treatments such as NSAIDs - Non Steroidal Anti Inflammatory Drugs, cold and warm compress, neck exercises, massage Fuentes, and other pain treatments. These symptoms are severe and cause impairment of daily functions, so rapid intervention is necessary. We  explained the risks and benefits of the procedure. Patient understands and agrees with the plan of care.  - Obtain prior authorization from insurance for procedures. - Consider referral to a physiatrist for epidural steroid injection if conservative measures fail. - Reduce phone and Facebook time, they are designed to be addictive -- utilize night mode on phone  We think you will enjoy the book Digital Minimalism: Choosing a Focused Life in a Noisy World. It discusses ways to rethink how we use social media and tools to help incorporate these strategies into daily life.   2. Right temporal tension type headache, low suspicion for temporal arteritis, TMJ, or parotid tumor related with unremarkable MRI Brain in patient with bruxism, likely some component of tension with trigger points in the trapezius, denied tenderness at occipital region.  Chronic tension-type headaches with approximately 30 episodes per month, each lasting 30 minutes to an hour. Previous preventive medications and recent occipital nerve block and trigger point injections in April were ineffective.  - Trial of Atogepant for headache management.  3. Chronic low back pain Chronic low back pain mentioned but not the primary focus of the current visit.  4. Mild macroscopic ischemic changes Mild macroscopic ischemic changes noted on previous MRI of the brain. Currently managed with aspirin.  5. Borderline diabetes Borderline diabetes noted in the medical history.  6. 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 Follow up in 6 months with Dawn Fuentes, Dawn Fuentes  Return in about 6 months (around 12/25/2024) for Follow up in 6 months with Dawn Fuentes, Dawn Fuentes . This note has been created using automated tools and reviewed for accuracy by Dawn Fuentes.  Interim History date 06/24/2024   Dawn Fuentes is a 78 y.o. female here for  treatment and evaluation of, unaccompanied   [name] last visit was on 04/04/2024  Patient reports no changes in occipital neuralgia. Patient reports right side still hurts and believes the trigger point injection she was given did not hit the area.  Patient reports headaches have been the same. Patient reports 30 headaches a month that last for 30 minutes until tylenol  kick in. Patient is taking Propanolol, baclofen , and citalopram  as directed with no reported side effects.   Patient had labs drawn 04/05/2024 (Microalbumin, Lipid, TSH)  History of Present Illness Dawn Fuentes is a 78 year old female who presents for occipital neuroplot trigger point injections.  She experiences chronic low back pain and tension-type headaches, with approximately 30 headaches per month, each lasting about 30 minutes to an hour. She has been taking two extra strength 500 mg Tylenol  tablets two to three times a day for the last two months to manage her headaches.  Her workup has included a CT scan of the neck and an MRI of the brain in 2023, which showed mild macroscopic ischemic changes. An MRI of the face with trigeminal nerve protocol was also performed. She has been tried on various preventive medications including topiramate , Celexa , pregabalin , gabapentin , Cymbalta , and propranolol.  She has a history of a parotid gland tumor found during a headache workup, for which she underwent surgery. She has had occipital nerve block trigger point injections in the past, with the last one being on April 16, 2024, which did not provide relief.  Her headaches start in the occipital region and sometimes radiate down her shoulder. The pain has continued since April, with radiation down her shoulder. An MRI of the C spine in 2024 showed arthritic changes at C5-C6.  She has a history of borderline diabetes and is taking aspirin for her ischemic changes.  I reviewed labs, imaging, and notes in [place], [place], and from outside providers, if available.    Results RADIOLOGY CT of the neck with contrast: Parotid gland tumor (06/2023) MRI of the brain: Mild macroscopic ischemic changes (2023) MRI of the cervical spine: Arthritic changes at C5, C6 (06/2023)  Disease Summary: (Aggregate of information from previous visits)   Dawn Fuentes is a right handed 78 y.o. female Retired here for evaluation of Headache and neck pain , referred by Dr. Glenard.  Right temporal tension type headache, low suspicion for temporal arteritis, TMJ, or parotid tumor related with unremarkable MRI Brain in patient with bruxism, likely some component of tension with trigger points in the trapezius, denied tenderness at occipital region - improving: Patient states she is here today for headaches. Her headaches started 2021, she has one type of headache (temporal). She experiences throbbing and dull aching. She can tell she is about to get a headache by pain in her temporal region sometimes down into her ear. Her headaches typically take 15 minutes to reach it's worst and it can last 15 to 20 minutes. Her headaches have changed from occurring 2-3 times per day, to occurring every 5 hours, to occurring daily. Hunger sometimes trigger her headaches. Tylenol  and a dark quiet room make her symptoms better. She has tried Topiramate  as preventative medication, but she could not tolerate it. She does take over the counter Tylenol . She has seen Dr. Marcelino for her symptoms. She mentioned that she had surgery on May of 2022 for tumor on Parotid Gland and she is still experiencing numbess in her neck and ear (right infra-auricular region). She has a  herniated disc (have not had surgery). She does not suffer from depression, anxiety, obstructive sleep apnea, significant head trauma, involved in any legal matters or any type of abuse. Grinds teeth, feels they may impact the headaches.   10/28/2022 -Patient states her headaches are doing much better.  She reports days without headache. Other  days she will only have one headache. Taking propranolol LA 60 mg nightly.    03/29/2022 - Patient presents for routine follow-up - reports improvement in headache frequency, previously 2 - 3x daily, now once daily. Completed course of acupuncture (not helpful). Headaches last approximately 10 - 15 minutes with Tylenol .  Numbness has improved. Denies aural fullness - describes as a pain radiating into her right ear. Denies nausea, vomiting, photosensitivity, phonophobia. Has history of glaucoma, follows with ophthalmology. Denies cervicalgia, visual disturbance, jaw claudication.   CT Neck with contrast 03/13/2021: 1.1 x 1.1 x 1.3 cm homogeneous hyperdense lesion in the right parotid gland likely represents a benign mixed tumor. Warthin's tumor is considered less likely. No significant cervical adenopathy. Typical degenerative changes of the cervical spine are most evident at C6-7 and C5-6.   MRI Brain without contrast 01/06/2022 - No evidence of acute intracranial abnormality.  Mild chronic small vessel ischemic changes within the cerebral white  matter. Otherwise unremarkable non-contrast MRI appearance of the brain for age.   08/26/2022 MRI Face with Trigeminal Nerve Protocol  Postoperative changes to the right parotid.  Otherwise unremarkable.   Medications:  Preventive: Topiramate , Celexa , Pregabalin , Gabapentin , Cymbalta  (ineffective), propranolol  Rescue: Tylenol , Baclofen   History of tumor on Parotid Gland status post surgery 04/2021 with residual right-sided infraauricular numbness  CT Neck with contrast 03/13/2021: 1.1 x 1.1 x 1.3 cm homogeneous hyperdense lesion in the right parotid gland likely represents a benign mixed tumor. Warthin's tumor is considered less likely. No significant cervical adenopathy. Typical degenerative changes of the cervical spine are most evident at C6-7 and C5-6.   Right Occipital Neuralgia with component of myofascial pain syndrome with right parascapular  tenderness: status post cervical epidural steroid injections and Physical Fuentes and acupuncture without improvement.   MRI of Cervical Spine without contrast 07/12/2023 - 1. Mild cervical spine spondylosis as described above, most severe  at C5-6. 2. No acute osseous injury of the cervical spine.    Microvascular ischemic and metabolic changes: Taking Aspirin 81 mg daily.  Bruxism: Using mouth guard.   Physical Exam   Vitals Vitals:   06/24/24 0827  BP: 124/72  Pulse: 78  SpO2: 98%  Weight: 79.8 kg (176 lb)  Height: 165.1 cm (5' 5)  PainSc:   7    Body mass index is 29.29 kg/m.  (Some of the exam changes noted are from previous clinical observations)  Physical Exam    General Exam Some right greater occipital nerve tenderness   Neurological Exam   Medications: Current Outpatient Medications on File Prior to Visit  Medication Sig Dispense Refill   acetaminophen  (TYLENOL ) 500 mg capsule Take 500 mg by mouth every 6 (six) hours     ALPRAZolam  (XANAX ) 0.5 MG tablet Take 0.25-0.5 mg by mouth as directed for Sleep.     aspirin 81 MG EC tablet Take 81 mg by mouth once daily.     baclofen  (LIORESAL ) 10 MG tablet Take 10 mg by mouth 3 (three) times daily     busPIRone  (BUSPAR ) 5 MG tablet Take 5 mg by mouth 3 (three) times daily as needed     cholecalciferol (VITAMIN D3) 1000  unit tablet Take 1 tablet by mouth once daily     citalopram  (CELEXA ) 20 MG tablet Take 20 mg by mouth once daily     cyanocobalamin  (VITAMIN [name]) 1,000 mcg/mL injection Inject 1 mL into the muscle     empagliflozin  (JARDIANCE ) 25 mg tablet Take 25 mg by mouth every morning before breakfast     famotidine (PEPCID) 40 MG tablet Take 1 tablet (40 mg total) by mouth at bedtime 90 tablet 1   levothyroxine  (SYNTHROID ) 50 MCG tablet Take 1 tablet by mouth once daily     losartan -hydroCHLOROthiazide  (HYZAAR) 100-12.5 mg tablet Take by mouth     montelukast  (SINGULAIR ) 10 mg tablet Take 10 mg  by mouth once daily.     pantoprazole  (PROTONIX ) 40 MG DR tablet Take 40 mg by mouth every morning.     propranoloL (INDERAL LA) 80 MG 24 hr capsule Take 1 capsule (80 mg total) by mouth once daily 90 capsule 1   simvastatin  (ZOCOR ) 40 MG tablet Take 40 mg by mouth once daily.     timolol (TIMOPTIC-XE) 0.25 % ophthalmic gel-forming solution Place 1 drop into both eyes 2 (two) times daily     VYZULTA 0.024 % eye drops Place 1 drop into both eyes once daily     zolpidem  (AMBIEN ) 5 MG tablet Take 5 mg by mouth nightly     denosumab  (PROLIA ) 60 mg/mL inj syringe Inject 1 mL (60 mg total) subcutaneously every 6 (six) months (Patient not taking: Reported on 06/24/2024) 1 mL 1   No current facility-administered medications on file prior to visit.   Past Medical History:  Past Medical History:  Diagnosis Date   Acid reflux    with esophageal spasms   Anxiety    Benign esophageal stricture 05/18/2010   Benign esophageal stricture 05/29/2015   Gastritis, chronic 06/28/2002   History of goiter    Hypercholesterolemia    Hypothyroidism    Osteopenia    Schatzki's ring    Tubular adenoma of colon 05/18/2010, 08/16/1999    Past Surgical History:  Past Surgical History:  Procedure Laterality Date   COLONOSCOPY  08/16/1999   COLONOSCOPY  06/28/2002   EGD  06/28/2002   COLONOSCOPY  04/21/2006   DKS (repeat date 04/22/2011)   COLONOSCOPY  05/18/2010   Repeat/OH Adenoma.   EGD  05/18/2010   COLONOSCOPY  05/29/2015   Entire examined colon is normal/PHx of polyps/Repeat 43yrs/PYO   EGD  05/29/2015   Benign esophageal stricture.Dilated/No repeat/PYO   COLONOSCOPY  09/14/2020   Normal colon/PHx CP/Repeat 1yrs/CTL   EGD  09/14/2020   Esophagus dilated/No Repeat/CTL   EGD @ [facility]  02/29/2024   Dysphagia/ large hiatal hernia/ non-obstructing Schatzki Ring/ Repeat PRN/ CTL   APPENDECTOMY     LAPAROSCOPIC HYSTERECTOMY TOTAL     LAPAROSCOPY DIAGNOSTIC     for ovarian cyst  excision   TONSILLECTOMY     Family History:  Family History  Problem Relation Name Age of Onset   Colon cancer Father     Colon polyps Father     Colon polyps Mother     Colon polyps Sister     Social History:  Social History   Socioeconomic History   Marital status: Widowed  Tobacco Use   Smoking status: Never   Smokeless tobacco: Never  Substance and Sexual Activity   Alcohol use: No    Alcohol/week: 0.0 standard drinks of alcohol   Social Drivers of Corporate Investment Banker Strain: Low  Risk  (05/23/2024)   Received from [facility]   Overall Financial Resource Strain (CARDIA)    Difficulty of Paying Living Expenses: Not hard at all  Food Insecurity: No Food Insecurity (05/23/2024)   Received from [facility]   Hunger Vital Sign    Within the past 12 months, you worried that your food would run out before you got the money to buy more.: Never true    Within the past 12 months, the food you bought just didn't last and you didn't have money to get more.: Never true  Transportation Needs: No Transportation Needs (05/23/2024)   Received from [facility] - Transportation    Lack of Transportation (Medical): No    Lack of Transportation (Non-Medical): No  Physical Activity: Insufficiently Active (05/23/2024)   Received from [facility]   Exercise Vital Sign    On average, how many days per week do you engage in moderate to strenuous exercise (like a brisk walk)?: 2 days    On average, how many minutes do you engage in exercise at this level?: 20 min  Stress: No Stress Concern Present (05/23/2024)   Received from [facility] - Occupational Stress Questionnaire    Feeling of Stress : Not at all  Social Connections: Moderately Isolated (05/23/2024)   Received from [facility]   Social Connection and Isolation Panel    In a typical week, how many times do you talk on the phone with family, friends, or  neighbors?: More than three times a week    How often do you get together with friends or relatives?: More than three times a week    How often do you attend church or religious services?: More than 4 times per year    Do you belong to any clubs or organizations such as church groups, unions, fraternal or athletic groups, or school groups?: No    How often do you attend meetings of the clubs or organizations you belong to?: Never    Are you married, widowed, divorced, separated, never married, or living with a partner?: Widowed  Housing Stability: Unknown (01/07/2024)   Housing Stability Vital Sign    Homeless in the Last Year: No   Allergies:  Allergies  Allergen Reactions   Brimonidine Tartrate Other (See Comments)   Latanoprost Other (See Comments)   Meloxicam  Unknown    Increased blood pressure   Raloxifene Other (See Comments)    bone pain   Sulfa (Sulfonamide Antibiotics) Nausea and Other (See Comments)    Intolerance rather than allergy. Happened over 40 years ago    Dr. Jannett Fuentes

## 2024-06-29 ENCOUNTER — Other Ambulatory Visit: Payer: Self-pay | Admitting: Family Medicine

## 2024-06-29 DIAGNOSIS — G4700 Insomnia, unspecified: Secondary | ICD-10-CM

## 2024-07-01 ENCOUNTER — Other Ambulatory Visit: Payer: Self-pay | Admitting: Family Medicine

## 2024-07-01 DIAGNOSIS — G4700 Insomnia, unspecified: Secondary | ICD-10-CM

## 2024-07-02 DIAGNOSIS — M7918 Myalgia, other site: Secondary | ICD-10-CM | POA: Diagnosis not present

## 2024-07-02 DIAGNOSIS — Z1331 Encounter for screening for depression: Secondary | ICD-10-CM | POA: Diagnosis not present

## 2024-07-02 DIAGNOSIS — M5481 Occipital neuralgia: Secondary | ICD-10-CM | POA: Diagnosis not present

## 2024-07-02 NOTE — Procedures (Signed)
 Occipital Nerve Block and Trigger Point Injections  Indication: Occipital Neuralgia  Rationale: Patient continues to have symptoms even after conservative treatments such as NSAIDs and other headache treatments. These symptoms are severe and causing impairment of daily functions and patient asked for rapid intervention.  Consent: We explained the risk and benefits of the procedure. Patient understands and agrees with plan of care. Patient gave verbal consent for the procedure.   Medication: __11 ml__ 2% Lidocaine  HCL (20mg /ml) __1 ml__ Triamcinolone  Acetonide (40 mg/ml) (Kenalog) We mixed these medications under sterile conditions and mixture was shaken again just before the injection (to keep uniform distribution of triamcinolone  particles suspension in lidocaine ). Syringe used:  10 cc  Needles used:  27 G, 1.5 inch Procedure: Patient was placed in a sitting position and we obtained suitable exposure for the procedure. Site of injection was identified by palpating along the superior nuchal line from mastoid to inion.   Ultrasound guidance was used to localize the site for vertebral artery. Ultrasound imaging was useful to avoid injection into critical vascular structure. Image saved into the chart. Scalp was marked at the site patient complained of most pain.  Skin was cleaned with alcohol. Under sterile conditions total __3__ ml of mixture was injected in Right Greater occipital nerve region.  No significant oozing of blood noted. Patient did not complain of any radiating pain or numbness after procedure.  Same procedure was repeated on the other Greater Occipital nerve region. Scalp was marked at the site patient complained of most pain.  Skin was cleaned with alcohol. Under sterile conditions total __3__ ml of mixture was injected in Left Greater occipital nerve region.  No significant oozing of blood noted. Patient did not complain of any radiating pain or numbness after  procedure. Complication: None.   Procedure Note: Trigger Point Injections Indication: Cervicalgia, myofascial pain syndrome  Rationale: Patient continues to have symptoms even after conservative treatments such as NSAIDs and exercise. These symptoms are severe and causing impairment of daily functions and patient asked for rapid intervention. Consent: We explained the procedure and patient understood and agrees with plan of care. Please see written consent in paper chart. Patient was explained the risks and benefits of procedure.  Procedure: Patient was placed in a comfortable position to obtain adequate exposure for the procedure. Sites of injections were identified and marked. Skin was cleaned with alcohol. Under sterile conditions total __6___ ml mixture was injected in below mentioned muscle groups.  Each injection sites were rubbed for better distribution of medicine. Mild oozing of blood was noticed at some sites which readily stopped with compression.  Muscles Injected were:                                                            Left          Right       No of injections per muscle  Trapezius            1        1  Terese Major      Rhomboid Major      Supraspinatus      Infraspinatus      Levator scapulae           1  1  Splenius Capitis      Cervical paraspinal  1  1  Thoracic paraspinal      Lumbar paraspinal      Latissimus dorsi      Gluteus Maximus      Complication: None.  Comment: Patient tolerated procedure well.  Ultrasound Image 1) Left greater occipital nerve 1)   2) Right greater occipital nerve 2)

## 2024-07-08 NOTE — Progress Notes (Signed)
" °  Assessment & Plan Cervical radiculopathy with Right Occipital Neuralgia Chronic cervical radiculopathy with associated right occipital neuralgia, presenting with muscle spasms contributing to occipital neuralgia. Previous treatments have been beneficial.  - Administer occipital nerve block - Administer trigger point injections to alleviate muscle spasms  2. Myofascial pain syndrome Chronic myofascial pain syndrome contributing to muscle spasms and occipital neuralgia.  - Administer trigger point injections to alleviate muscle spasms  History of Present Illness Dawn Fuentes is a 78 year old female with occipital neuralgia who presents for trigger point injections and occipital nerve block.  She has a history of occipital neuralgia and has previously received trigger point injections and occipital nerve blocks for relief. These treatments have previously provided effective relief. Currently, she experiences discomfort primarily on the right side of the occipital region, with some areas being more sensitive than others.  She does not experience lightheadedness or dizziness at present, although she acknowledges the possibility of feeling so upon standing.  Results    There were no vitals filed for this visit. There is no height or weight on file to calculate BMI.  Physical Exam MUSCULOSKELETAL: Tenderness in the neck region.  I spent a total of 11 minutes in both face-to-face and non-face-to-face activities, excluding procedures performed, for this visit on the date of this encounter.  This note has been created using automated tools and reviewed for accuracy by [facility]. "

## 2024-07-12 ENCOUNTER — Encounter: Payer: Self-pay | Admitting: Family Medicine

## 2024-07-12 ENCOUNTER — Ambulatory Visit: Admitting: Family Medicine

## 2024-07-12 VITALS — BP 128/70 | HR 80 | Resp 16 | Ht 65.0 in | Wt 175.0 lb

## 2024-07-12 DIAGNOSIS — M62838 Other muscle spasm: Secondary | ICD-10-CM | POA: Diagnosis not present

## 2024-07-12 DIAGNOSIS — E538 Deficiency of other specified B group vitamins: Secondary | ICD-10-CM | POA: Diagnosis not present

## 2024-07-12 DIAGNOSIS — E039 Hypothyroidism, unspecified: Secondary | ICD-10-CM

## 2024-07-12 DIAGNOSIS — L280 Lichen simplex chronicus: Secondary | ICD-10-CM | POA: Diagnosis not present

## 2024-07-12 DIAGNOSIS — E1122 Type 2 diabetes mellitus with diabetic chronic kidney disease: Secondary | ICD-10-CM | POA: Diagnosis not present

## 2024-07-12 DIAGNOSIS — G4700 Insomnia, unspecified: Secondary | ICD-10-CM | POA: Diagnosis not present

## 2024-07-12 DIAGNOSIS — E785 Hyperlipidemia, unspecified: Secondary | ICD-10-CM

## 2024-07-12 DIAGNOSIS — K219 Gastro-esophageal reflux disease without esophagitis: Secondary | ICD-10-CM

## 2024-07-12 DIAGNOSIS — F411 Generalized anxiety disorder: Secondary | ICD-10-CM

## 2024-07-12 DIAGNOSIS — I10 Essential (primary) hypertension: Secondary | ICD-10-CM | POA: Diagnosis not present

## 2024-07-12 DIAGNOSIS — N1831 Chronic kidney disease, stage 3a: Secondary | ICD-10-CM | POA: Diagnosis not present

## 2024-07-12 DIAGNOSIS — N183 Chronic kidney disease, stage 3 unspecified: Secondary | ICD-10-CM | POA: Diagnosis not present

## 2024-07-12 DIAGNOSIS — I129 Hypertensive chronic kidney disease with stage 1 through stage 4 chronic kidney disease, or unspecified chronic kidney disease: Secondary | ICD-10-CM

## 2024-07-12 LAB — COMPREHENSIVE METABOLIC PANEL WITH GFR
AG Ratio: 1.8 (calc) (ref 1.0–2.5)
ALT: 16 U/L (ref 6–29)
AST: 14 U/L (ref 10–35)
Albumin: 4.2 g/dL (ref 3.6–5.1)
Alkaline phosphatase (APISO): 45 U/L (ref 37–153)
BUN/Creatinine Ratio: 16 (calc) (ref 6–22)
BUN: 24 mg/dL (ref 7–25)
CO2: 25 mmol/L (ref 20–32)
Calcium: 9.7 mg/dL (ref 8.6–10.4)
Chloride: 102 mmol/L (ref 98–110)
Creat: 1.46 mg/dL — ABNORMAL HIGH (ref 0.60–1.00)
Globulin: 2.4 g/dL (ref 1.9–3.7)
Glucose, Bld: 124 mg/dL — ABNORMAL HIGH (ref 65–99)
Potassium: 4 mmol/L (ref 3.5–5.3)
Sodium: 138 mmol/L (ref 135–146)
Total Bilirubin: 0.3 mg/dL (ref 0.2–1.2)
Total Protein: 6.6 g/dL (ref 6.1–8.1)
eGFR: 37 mL/min/{1.73_m2} — ABNORMAL LOW

## 2024-07-12 LAB — POCT GLYCOSYLATED HEMOGLOBIN (HGB A1C): Hemoglobin A1C: 6.2 % — AB (ref 4.0–5.6)

## 2024-07-12 MED ORDER — QUETIAPINE FUMARATE 25 MG PO TABS: 25.0000 mg | ORAL_TABLET | Freq: Every day | ORAL | 0 refills | Status: DC

## 2024-07-12 MED ORDER — ALPRAZOLAM 0.25 MG PO TABS: 0.2500 mg | ORAL_TABLET | Freq: Every day | ORAL | 0 refills | Status: DC | PRN

## 2024-07-12 MED ORDER — CLOBETASOL PROPIONATE 0.05 % EX OINT: 1.0000 | TOPICAL_OINTMENT | CUTANEOUS | 2 refills | Status: AC

## 2024-07-12 MED ORDER — PANTOPRAZOLE SODIUM 40 MG PO TBEC: 40.0000 mg | DELAYED_RELEASE_TABLET | Freq: Every day | ORAL | 1 refills | Status: AC

## 2024-07-12 MED ORDER — BUSPIRONE HCL 10 MG PO TABS: 10.0000 mg | ORAL_TABLET | Freq: Three times a day (TID) | ORAL | 0 refills | Status: DC | PRN

## 2024-07-12 MED ORDER — CLOBETASOL PROPIONATE 0.05 % EX OINT: 1.0000 | TOPICAL_OINTMENT | CUTANEOUS | 2 refills | Status: DC

## 2024-07-12 MED ORDER — LEVOTHYROXINE SODIUM 75 MCG PO TABS: 75.0000 ug | ORAL_TABLET | Freq: Every day | ORAL | 0 refills | Status: DC

## 2024-07-12 MED ORDER — CYANOCOBALAMIN 1000 MCG/ML IJ SOLN
1000.0000 ug | Freq: Once | INTRAMUSCULAR | Status: AC
  Administered 2024-07-12: 1000 ug via INTRAMUSCULAR

## 2024-07-12 NOTE — Progress Notes (Signed)
 Name: TEDRA COPPERNOLL   MRN: [id number]    DOB: 1946-10-16   Date:07/12/2024       Progress Note  Subjective  Chief Complaint  Chief Complaint  Patient presents with   Medical Management of Chronic Issues   Diabetes   Discussed the use of AI scribe software for clinical note transcription with the patient, who gave verbal consent to proceed.  History of Present Illness Dawn Fuentes is a 78 year old female with type 2 diabetes and chronic kidney disease who presents for a follow-up visit.  She has been taking Jardiance  for two months without side effects. Her last GFR in March was 39, and her proteinuria test was negative at her last visit. Her A1c is stable at 6.2, slightly up from 6.1 in March.  She manages chronic kidney disease and hypertension with losartan  for blood pressure control and Jardiance  for kidney protection. No unusual urinary symptoms such as increased frequency or itching are reported.  For dyslipidemia, she takes simvastatin  40 mg daily without muscle aches. She manages gastroesophageal reflux disease (GERD) with pantoprazole  and occasionally uses Gas-X after heavy meals.  Hypothyroidism, last TSH at goal, takes levothyroxine  as prescribed  GERD takes PPI in am and is taking H2 blocker given by GI at night, explained it may affect her kidney function   She has a history of [name] deficiency and receives monthly [name] injections, with the last injection administered today.   She has chronic insomnia, although she has been out of Ambien  for three weeks, leading to difficulty sleeping. She occasionally uses Tylenol  PM or alprazolam  to aid sleep.  For anxiety and depression, she takes citalopram  and buspirone  twice daily. She uses alprazolam  sparingly, with two pills remaining from a prescription of ten given in May.  She uses clobetasol  ointment for itching, particularly on her vulva, and requests a refill as she has not received it in a long time.  She takes short  trips with friends, indicating an active social life. She plans to visit the beach and [place] in the coming months.  No chest pain, palpitations, or unusual urinary symptoms. No muscle aches from simvastatin  and no side effects from Jardiance . She occasionally feels nervous when hungry.    Patient Active Problem List   Diagnosis Date Noted   Type 2 diabetes mellitus with stage 3 chronic kidney disease and hypertension (HCC) 03/11/2024   Diabetes mellitus, new onset (HCC) 11/02/2023   Occipital neuralgia of right side 09/07/2023   Perennial allergic rhinitis with seasonal variation 09/07/2023   Lichen simplex 03/08/2022   Chronic nonintractable headache 07/06/2021   Cervicogenic migraine 01/14/2021   Cervical facet joint syndrome 01/14/2021   Cervical radiculitis 12/01/2020   Bilateral hip pain 12/01/2020   Chronic bilateral low back pain without sciatica 12/01/2020   Chronic SI joint pain 12/01/2020   Chronic pain syndrome 12/01/2020   Body mass index (BMI) 30.0-30.9, adult 11/18/2020   Essential (primary) hypertension 11/18/2020   Cervical radicular pain 11/18/2020   Degeneration of cervical intervertebral disc 06/25/2020   Lichen sclerosus et atrophicus of the vulva 11/04/2019   [name] deficiency 12/26/2018   Primary open angle glaucoma of both eyes 11/21/2016   Chronic kidney disease (CKD), stage III (moderate) (HCC) 04/06/2016   Cystitis 10/12/2015   GAD (generalized anxiety disorder) 08/21/2015   Benign hypertension 08/21/2015   Insomnia, persistent 08/21/2015   Dyslipidemia 08/21/2015   Gastro-esophageal reflux disease without esophagitis 08/21/2015   Glaucoma 08/21/2015   Blood glucose elevated 08/21/2015  Adult hypothyroidism 08/21/2015   Climacteric 08/21/2015   Senile osteoporosis 08/21/2015   Seasonal allergies 08/21/2015   Female genuine stress incontinence 08/21/2015   Vitamin D  deficiency 08/21/2015    Past Surgical History:  Procedure Laterality Date    ABDOMINAL HYSTERECTOMY     21 years ago   APPENDECTOMY     COLONOSCOPY N/A 09/14/2020   Procedure: COLONOSCOPY;  Surgeon: Maryruth Ole DASEN, MD;  Location: [facility] ENDOSCOPY;  Service: Endoscopy;  Laterality: N/A;   COLONOSCOPY WITH PROPOFOL  N/A 05/29/2015   Procedure: COLONOSCOPY WITH PROPOFOL ;  Surgeon: Deward CINDERELLA Piedmont, MD;  Location: [facility] ENDOSCOPY;  Service: Gastroenterology;  Laterality: N/A;   ESOPHAGOGASTRODUODENOSCOPY N/A 05/29/2015   Procedure: ESOPHAGOGASTRODUODENOSCOPY (EGD);  Surgeon: Deward CINDERELLA Piedmont, MD;  Location: [facility] ENDOSCOPY;  Service: Gastroenterology;  Laterality: N/A;   ESOPHAGOGASTRODUODENOSCOPY N/A 09/14/2020   Procedure: ESOPHAGOGASTRODUODENOSCOPY (EGD);  Surgeon: Maryruth Ole DASEN, MD;  Location: [facility] ENDOSCOPY;  Service: Endoscopy;  Laterality: N/A;   EYE SURGERY     eye lids   OVARIAN CYST REMOVAL     needed transfusion   PAROTIDECTOMY Right 05/05/2021   Procedure: SUPERFICIAL PAROTIDECTOMY;  Surgeon: Blair Deward, MD;  Location: [facility] ORS;  Service: ENT;  Laterality: Right;   PHOTOCOAGULATION WITH LASER Right 11/21/2016   Procedure: PHOTOCOAGULATION WITH LASER;  Surgeon: Donzell Arlyce Budd, MD;  Location: [facility];  Service: Ophthalmology;  Laterality: Right;  A micropulse probe was applied to each hemilimbus with the following settings: , 31.3% duty cycle, 90 seconds x 3 applications.    TONSILLECTOMY      Family History  Problem Relation Age of Onset   Hypertension Mother    Osteoporosis Mother    COPD Mother    Dementia Mother    Cancer Father        Colon   Hypothyroidism Daughter    Aneurysm Brother    COPD Sister     Social History   Tobacco Use   Smoking status: Never   Smokeless tobacco: Never   Tobacco comments:    smoking cessation materials not required  Substance Use Topics   Alcohol use: No    Alcohol/week: 0.0 standard drinks of alcohol     Current Outpatient Medications:    ALPRAZolam  (XANAX ) 0.25 MG tablet, Take 1  tablet (0.25 mg total) by mouth daily as needed for anxiety. Rarely, Disp: 10 tablet, Rfl: 0   baclofen  (LIORESAL ) 10 MG tablet, Take 1 tablet (10 mg total) by mouth 2 (two) times daily., Disp: 180 each, Rfl: 1   busPIRone  (BUSPAR ) 10 MG tablet, Take 1 tablet (10 mg total) by mouth 3 (three) times daily as needed., Disp: 270 tablet, Rfl: 0   Calcium Carb-Cholecalciferol 600-800 MG-UNIT TABS, Take 1 tablet by mouth daily., Disp: , Rfl:    citalopram  (CELEXA ) 20 MG tablet, Take 1 tablet (20 mg total) by mouth daily., Disp: 90 tablet, Rfl: 1   clobetasol  ointment (TEMOVATE ) 0.05 %, Apply 1 application. topically 2 (two) times a week., Disp: 30 g, Rfl: 2   empagliflozin  (JARDIANCE ) 25 MG TABS tablet, Take 1 tablet (25 mg total) by mouth daily before breakfast., Disp: 90 tablet, Rfl: 1   famotidine (PEPCID) 40 MG tablet, Take 1 tablet by mouth at bedtime., Disp: , Rfl:    hydrocortisone (ANUSOL-HC) 2.5 % rectal cream, Apply topically., Disp: , Rfl:    levocetirizine (XYZAL ) 5 MG tablet, Take 1 tablet (5 mg total) by mouth every evening., Disp: 90 tablet, Rfl: 1   levothyroxine  (SYNTHROID )  75 MCG tablet, Take 1 tablet (75 mcg total) by mouth daily before breakfast. Except for Sunday take half pill, Disp: 90 tablet, Rfl: 0   losartan -hydrochlorothiazide  (HYZAAR) 50-12.5 MG tablet, Take 1 tablet by mouth daily., Disp: 90 tablet, Rfl: 1   pantoprazole  (PROTONIX ) 40 MG tablet, Take 1 tablet (40 mg total) by mouth daily., Disp: 90 tablet, Rfl: 1   propranolol ER (INDERAL LA) 80 MG 24 hr capsule, , Disp: , Rfl:    simvastatin  (ZOCOR ) 40 MG tablet, Take 1 tablet (40 mg total) by mouth every evening., Disp: 90 tablet, Rfl: 1   timolol (TIMOPTIC) 0.5 % ophthalmic solution, Place 1 drop into both eyes 2 (two) times daily., Disp: , Rfl: 5   Turmeric 500 MG TABS, Take 500 mg by mouth in the morning and at bedtime., Disp: , Rfl:    VYZULTA 0.024 % SOLN, Place 1 drop into both eyes at bedtime., Disp: , Rfl: 2    zolpidem  (AMBIEN ) 5 MG tablet, Take 1 tablet (5 mg total) by mouth at bedtime., Disp: 30 tablet, Rfl: 0  Allergies  Allergen Reactions   Brimonidine Tartrate Other (See Comments)    Burning in eyes   Latanoprost Other (See Comments)    burning   Meloxicam      Increased blood pressure    Raloxifene     bone pain    I personally reviewed active problem list, medication list, allergies, family history with the patient/caregiver today.   ROS  Ten systems reviewed and is negative except as mentioned in HPI    Objective Physical Exam  CONSTITUTIONAL: Patient appears well-developed and well-nourished. No distress. HEENT: Head atraumatic, normocephalic, neck supple. CARDIOVASCULAR: Normal rate, regular rhythm and normal heart sounds. No murmur heard. No BLE edema. PULMONARY: Effort normal and breath sounds normal. Lungs clear to auscultation bilaterally. No respiratory distress. ABDOMINAL: There is no tenderness or distention. MUSCULOSKELETAL: Normal gait. Without gross motor or sensory deficit. PSYCHIATRIC: Patient has a normal mood and affect. Behavior is normal. Judgment and thought content normal.  Vitals:   07/12/24 1317  BP: 128/70  Pulse: 80  Resp: 16  SpO2: 99%  Weight: 175 lb (79.4 kg)  Height: 5' 5 (1.651 m)    Body mass index is 29.12 kg/m.  Recent Results (from the past 2160 hours)  POCT HgB A1C     Status: Abnormal   Collection Time: 07/12/24  1:27 PM  Result Value Ref Range   Hemoglobin A1C 6.2 (A) 4.0 - 5.6 %   HbA1c POC (<> result, manual entry)     HbA1c, POC (prediabetic range)     HbA1c, POC (controlled diabetic range)        PHQ2/9:    05/23/2024   11:00 AM 05/14/2024   11:20 AM 03/11/2024    1:27 PM 01/17/2024    1:21 PM 11/02/2023   11:27 AM  Depression screen PHQ 2/9  Decreased Interest 0 0 1 0 0  Down, Depressed, Hopeless 0 0 1 0 0  PHQ - 2 Score 0 0 2 0 0  Altered sleeping 0 0 0 0 1  Tired, decreased energy 0 0 1 0 1  Change in  appetite 0 0 0 0 0  Feeling bad or failure about yourself  0 0 0 0 0  Trouble concentrating 0 0 0 0 0  Moving slowly or fidgety/restless 0 0 0 0 0  Suicidal thoughts 0 0 0 0 0  PHQ-9 Score 0 0 3 0 2  Difficult doing work/chores Not difficult at all Not difficult at all Somewhat difficult Not difficult at all Not difficult at all    phq 9 is negative  Fall Risk:    05/23/2024   11:03 AM 05/14/2024   11:17 AM 01/17/2024    1:21 PM 11/02/2023   11:26 AM 09/07/2023   11:36 AM  Fall Risk   Falls in the past year? 0 0 0 0 0  Number falls in past yr: 0 0 0    Injury with Fall? 0 0 0    Risk for fall due to : No Fall Risks No Fall Risks No Fall Risks No Fall Risks No Fall Risks  Follow up Falls evaluation completed Falls prevention discussed;Education provided;Falls evaluation completed Falls prevention discussed;Education provided;Falls evaluation completed Falls prevention discussed Falls prevention discussed      Assessment & Plan Type 2 diabetes mellitus with chronic kidney disease stage 3 and hypertension Diabetes well-controlled with A1c 6.2. CKD stage 3 managed with Jardiance  and losartan . Blood pressure controlled at 128/70 mmHg. - Order comprehensive metabolic panel including GFR. - Continue Jardiance  25 mg. - Continue losartan . - Continue propranolol. - Advise Tylenol  only for pain management.  Hyperlipidemia Managed with simvastatin  40 mg. No side effects reported. - Continue simvastatin  40 mg.  Gastroesophageal reflux disease GERD managed with pantoprazole . Occasional Gas-X use. Caution with famotidine due to kidney impact. - Continue pantoprazole . - Advise caution with famotidine; consider reducing dose or using as needed.  Chronic insomnia Managed with Ambien , currently out of medication. Quetiapine suggested as alternative. - Prescribe quetiapine for trial over the weekend. - Instruct to contact if ineffective to resume Ambien .  Depression and anxiety Managed  with citalopram  and alprazolam . Increased alprazolam  use due to lack of Ambien . Buspirone  used for anxiety. - Prescribe 10 alprazolam  tablets. - Continue citalopram . - Continue buspirone  up to three times a day.  Muscle spasm of back/neck - Prescribe baclofen .  Lichen simplex of vulva - Prescribe clobetasol  ointment.  General Health Maintenance Discussed immunizations. Flu shot at next visit. Tetanus shot overdue since 2021. - Administer flu shot at next visit. - Advise obtaining tetanus shot at local pharmacy.

## 2024-07-14 ENCOUNTER — Encounter: Payer: Self-pay | Admitting: Family Medicine

## 2024-07-15 ENCOUNTER — Other Ambulatory Visit: Payer: Self-pay | Admitting: Family Medicine

## 2024-07-15 ENCOUNTER — Ambulatory Visit: Payer: Self-pay | Admitting: Family Medicine

## 2024-07-15 DIAGNOSIS — G4700 Insomnia, unspecified: Secondary | ICD-10-CM

## 2024-07-15 DIAGNOSIS — M542 Cervicalgia: Secondary | ICD-10-CM | POA: Diagnosis not present

## 2024-07-15 MED ORDER — ZOLPIDEM TARTRATE 5 MG PO TABS: 5.0000 mg | ORAL_TABLET | Freq: Every day | ORAL | 0 refills | Status: DC

## 2024-07-17 DIAGNOSIS — M542 Cervicalgia: Secondary | ICD-10-CM | POA: Diagnosis not present

## 2024-07-30 DIAGNOSIS — M542 Cervicalgia: Secondary | ICD-10-CM | POA: Diagnosis not present

## 2024-08-13 DIAGNOSIS — M542 Cervicalgia: Secondary | ICD-10-CM | POA: Diagnosis not present

## 2024-08-21 DIAGNOSIS — M542 Cervicalgia: Secondary | ICD-10-CM | POA: Diagnosis not present

## 2024-08-26 ENCOUNTER — Encounter: Payer: Self-pay | Admitting: Family Medicine

## 2024-08-26 ENCOUNTER — Ambulatory Visit (INDEPENDENT_AMBULATORY_CARE_PROVIDER_SITE_OTHER): Admitting: Family Medicine

## 2024-08-26 VITALS — BP 110/72 | HR 76 | Resp 16 | Ht 65.0 in | Wt 171.9 lb

## 2024-08-26 DIAGNOSIS — I129 Hypertensive chronic kidney disease with stage 1 through stage 4 chronic kidney disease, or unspecified chronic kidney disease: Secondary | ICD-10-CM | POA: Diagnosis not present

## 2024-08-26 DIAGNOSIS — E538 Deficiency of other specified B group vitamins: Secondary | ICD-10-CM | POA: Diagnosis not present

## 2024-08-26 DIAGNOSIS — M5481 Occipital neuralgia: Secondary | ICD-10-CM | POA: Diagnosis not present

## 2024-08-26 DIAGNOSIS — E1122 Type 2 diabetes mellitus with diabetic chronic kidney disease: Secondary | ICD-10-CM

## 2024-08-26 DIAGNOSIS — N183 Chronic kidney disease, stage 3 unspecified: Secondary | ICD-10-CM

## 2024-08-26 DIAGNOSIS — N1831 Chronic kidney disease, stage 3a: Secondary | ICD-10-CM

## 2024-08-26 DIAGNOSIS — I10 Essential (primary) hypertension: Secondary | ICD-10-CM

## 2024-08-26 MED ORDER — CYANOCOBALAMIN 1000 MCG/ML IJ SOLN
1000.0000 ug | Freq: Once | INTRAMUSCULAR | Status: AC
  Administered 2024-08-26: 1000 ug via INTRAMUSCULAR

## 2024-08-26 MED ORDER — LOSARTAN POTASSIUM-HCTZ 50-12.5 MG PO TABS: 0.5000 | ORAL_TABLET | Freq: Every day | ORAL | 0 refills | Status: DC

## 2024-08-26 NOTE — Progress Notes (Unsigned)
 Name: Dawn Fuentes   MRN: [id number]    DOB: January 07, 1946   Date:08/26/2024       Progress Note  Subjective  Chief Complaint  Chief Complaint  Patient presents with   Medical Management of Chronic Issues    Bp readings running low at home    {HPI:32069}  Patient Active Problem List   Diagnosis Date Noted   Type 2 diabetes mellitus with stage 3 chronic kidney disease and hypertension (HCC) 03/11/2024   Diabetes mellitus, new onset (HCC) 11/02/2023   Occipital neuralgia of right side 09/07/2023   Perennial allergic rhinitis with seasonal variation 09/07/2023   Lichen simplex 03/08/2022   Chronic nonintractable headache 07/06/2021   Cervicogenic migraine 01/14/2021   Cervical facet joint syndrome 01/14/2021   Cervical radiculitis 12/01/2020   Bilateral hip pain 12/01/2020   Chronic bilateral low back pain without sciatica 12/01/2020   Chronic SI joint pain 12/01/2020   Chronic pain syndrome 12/01/2020   Body mass index (BMI) 30.0-30.9, adult 11/18/2020   Essential (primary) hypertension 11/18/2020   Cervical radicular pain 11/18/2020   Degeneration of cervical intervertebral disc 06/25/2020   Lichen sclerosus et atrophicus of the vulva 11/04/2019   [name] deficiency 12/26/2018   Primary open angle glaucoma of both eyes 11/21/2016   Chronic kidney disease (CKD), stage III (moderate) (HCC) 04/06/2016   Cystitis 10/12/2015   GAD (generalized anxiety disorder) 08/21/2015   Benign hypertension 08/21/2015   Insomnia, persistent 08/21/2015   Dyslipidemia 08/21/2015   Gastro-esophageal reflux disease without esophagitis 08/21/2015   Glaucoma 08/21/2015   Blood glucose elevated 08/21/2015   Adult hypothyroidism 08/21/2015   Climacteric 08/21/2015   Senile osteoporosis 08/21/2015   Seasonal allergies 08/21/2015   Female genuine stress incontinence 08/21/2015   Vitamin D  deficiency 08/21/2015    Social History   Tobacco Use   Smoking status: Never   Smokeless tobacco: Never    Tobacco comments:    smoking cessation materials not required  Substance Use Topics   Alcohol use: No    Alcohol/week: 0.0 standard drinks of alcohol     Current Outpatient Medications:    ALPRAZolam  (XANAX ) 0.25 MG tablet, Take 1 tablet (0.25 mg total) by mouth daily as needed for anxiety. Rarely, Disp: 10 tablet, Rfl: 0   baclofen  (LIORESAL ) 10 MG tablet, Take 1 tablet (10 mg total) by mouth 2 (two) times daily., Disp: 180 each, Rfl: 1   busPIRone  (BUSPAR ) 10 MG tablet, Take 1 tablet (10 mg total) by mouth 3 (three) times daily as needed., Disp: 270 tablet, Rfl: 0   Calcium Carb-Cholecalciferol 600-800 MG-UNIT TABS, Take 1 tablet by mouth daily., Disp: , Rfl:    citalopram  (CELEXA ) 20 MG tablet, Take 1 tablet (20 mg total) by mouth daily., Disp: 90 tablet, Rfl: 1   clobetasol  ointment (TEMOVATE ) 0.05 %, Apply 1 Application topically 2 (two) times a week., Disp: 30 g, Rfl: 2   empagliflozin  (JARDIANCE ) 25 MG TABS tablet, Take 1 tablet (25 mg total) by mouth daily before breakfast., Disp: 90 tablet, Rfl: 1   famotidine (PEPCID) 40 MG tablet, Take 1 tablet by mouth at bedtime., Disp: , Rfl:    hydrocortisone (ANUSOL-HC) 2.5 % rectal cream, Apply topically., Disp: , Rfl:    levocetirizine (XYZAL ) 5 MG tablet, Take 1 tablet (5 mg total) by mouth every evening., Disp: 90 tablet, Rfl: 1   levothyroxine  (SYNTHROID ) 75 MCG tablet, Take 1 tablet (75 mcg total) by mouth daily before breakfast. Except for Sunday take half pill, Disp:  90 tablet, Rfl: 0   losartan -hydrochlorothiazide  (HYZAAR) 50-12.5 MG tablet, Take 1 tablet by mouth daily., Disp: 90 tablet, Rfl: 1   pantoprazole  (PROTONIX ) 40 MG tablet, Take 1 tablet (40 mg total) by mouth daily., Disp: 90 tablet, Rfl: 1   propranolol ER (INDERAL LA) 80 MG 24 hr capsule, , Disp: , Rfl:    simvastatin  (ZOCOR ) 40 MG tablet, Take 1 tablet (40 mg total) by mouth every evening., Disp: 90 tablet, Rfl: 1   timolol (TIMOPTIC) 0.5 % ophthalmic solution, Place 1  drop into both eyes 2 (two) times daily., Disp: , Rfl: 5   Turmeric 500 MG TABS, Take 500 mg by mouth in the morning and at bedtime., Disp: , Rfl:    VYZULTA 0.024 % SOLN, Place 1 drop into both eyes at bedtime., Disp: , Rfl: 2   zolpidem  (AMBIEN ) 5 MG tablet, Take 1 tablet (5 mg total) by mouth at bedtime., Disp: 90 tablet, Rfl: 0  Allergies  Allergen Reactions   Brimonidine Tartrate Other (See Comments)    Burning in eyes   Latanoprost Other (See Comments)    burning   Meloxicam      Increased blood pressure    Raloxifene     bone pain    ROS  ***  Objective  Vitals:   08/26/24 1504  BP: 110/72  Pulse: 76  Resp: 16  SpO2: 100%  Weight: 171 lb 14.4 oz (78 kg)  Height: 5' 5 (1.651 m)    Body mass index is 28.61 kg/m.    Physical Exam   Recent Results (from the past 2160 hours)  POCT HgB A1C     Status: Abnormal   Collection Time: 07/12/24  1:27 PM  Result Value Ref Range   Hemoglobin A1C 6.2 (A) 4.0 - 5.6 %   HbA1c POC (<> result, manual entry)     HbA1c, POC (prediabetic range)     HbA1c, POC (controlled diabetic range)    Comprehensive metabolic panel with GFR     Status: Abnormal   Collection Time: 07/12/24  2:12 PM  Result Value Ref Range   Glucose, Bld 124 (H) 65 - 99 mg/dL    Comment: .            Fasting reference interval . For someone without known diabetes, a glucose value between 100 and 125 mg/dL is consistent with prediabetes and should be confirmed with a follow-up test. .    BUN 24 7 - 25 mg/dL   Creat 8.53 (H) 9.39 - 1.00 mg/dL   eGFR 37 (L) > OR = 60 mL/min/1.96m2   BUN/Creatinine Ratio 16 6 - 22 (calc)   Sodium 138 135 - 146 mmol/L   Potassium 4.0 3.5 - 5.3 mmol/L   Chloride 102 98 - 110 mmol/L   CO2 25 20 - 32 mmol/L   Calcium 9.7 8.6 - 10.4 mg/dL   Total Protein 6.6 6.1 - 8.1 g/dL   Albumin 4.2 3.6 - 5.1 g/dL   Globulin 2.4 1.9 - 3.7 g/dL (calc)   AG Ratio 1.8 1.0 - 2.5 (calc)   Total Bilirubin 0.3 0.2 - 1.2 mg/dL    Alkaline phosphatase (APISO) 45 37 - 153 U/L   AST 14 10 - 35 U/L   ALT 16 6 - 29 U/L     {A&P:32071}  There are no diagnoses linked to this encounter.

## 2024-08-29 DIAGNOSIS — M47812 Spondylosis without myelopathy or radiculopathy, cervical region: Secondary | ICD-10-CM | POA: Diagnosis not present

## 2024-08-29 DIAGNOSIS — M7918 Myalgia, other site: Secondary | ICD-10-CM | POA: Diagnosis not present

## 2024-09-13 DIAGNOSIS — M47812 Spondylosis without myelopathy or radiculopathy, cervical region: Secondary | ICD-10-CM | POA: Diagnosis not present

## 2024-09-16 ENCOUNTER — Other Ambulatory Visit: Payer: Self-pay | Admitting: Family Medicine

## 2024-09-16 DIAGNOSIS — F411 Generalized anxiety disorder: Secondary | ICD-10-CM

## 2024-09-26 DIAGNOSIS — M47816 Spondylosis without myelopathy or radiculopathy, lumbar region: Secondary | ICD-10-CM | POA: Diagnosis not present

## 2024-09-26 DIAGNOSIS — M47812 Spondylosis without myelopathy or radiculopathy, cervical region: Secondary | ICD-10-CM | POA: Diagnosis not present

## 2024-09-26 DIAGNOSIS — M5380 Other specified dorsopathies, site unspecified: Secondary | ICD-10-CM | POA: Diagnosis not present

## 2024-09-27 DIAGNOSIS — M47812 Spondylosis without myelopathy or radiculopathy, cervical region: Secondary | ICD-10-CM | POA: Diagnosis not present

## 2024-10-07 ENCOUNTER — Other Ambulatory Visit: Payer: Self-pay | Admitting: Family Medicine

## 2024-10-07 DIAGNOSIS — G4700 Insomnia, unspecified: Secondary | ICD-10-CM

## 2024-10-08 ENCOUNTER — Telehealth: Payer: Self-pay | Admitting: Family Medicine

## 2024-10-08 ENCOUNTER — Other Ambulatory Visit: Payer: Self-pay | Admitting: Family Medicine

## 2024-10-08 ENCOUNTER — Encounter: Payer: Self-pay | Admitting: Family Medicine

## 2024-10-08 DIAGNOSIS — H402213 Chronic angle-closure glaucoma, right eye, severe stage: Secondary | ICD-10-CM | POA: Diagnosis not present

## 2024-10-08 DIAGNOSIS — G4700 Insomnia, unspecified: Secondary | ICD-10-CM

## 2024-10-08 DIAGNOSIS — H2513 Age-related nuclear cataract, bilateral: Secondary | ICD-10-CM | POA: Diagnosis not present

## 2024-10-08 DIAGNOSIS — H16122 Filamentary keratitis, left eye: Secondary | ICD-10-CM | POA: Diagnosis not present

## 2024-10-08 DIAGNOSIS — H401122 Primary open-angle glaucoma, left eye, moderate stage: Secondary | ICD-10-CM | POA: Diagnosis not present

## 2024-10-08 MED ORDER — ZOLPIDEM TARTRATE 5 MG PO TABS: 5.0000 mg | ORAL_TABLET | Freq: Every day | ORAL | 0 refills | Status: DC

## 2024-10-08 NOTE — Telephone Encounter (Signed)
 Has enough till December unable to refill at this moment

## 2024-10-08 NOTE — Telephone Encounter (Signed)
losartan-hydrochlorothiazide (HYZAAR) 50-12.5 MG tablet

## 2024-10-09 ENCOUNTER — Other Ambulatory Visit: Payer: Self-pay | Admitting: Family Medicine

## 2024-10-09 DIAGNOSIS — M5416 Radiculopathy, lumbar region: Secondary | ICD-10-CM

## 2024-10-09 DIAGNOSIS — M47816 Spondylosis without myelopathy or radiculopathy, lumbar region: Secondary | ICD-10-CM | POA: Diagnosis not present

## 2024-10-11 ENCOUNTER — Ambulatory Visit
Admission: RE | Admit: 2024-10-11 | Discharge: 2024-10-11 | Disposition: A | Discharge status: Home / Self Care | Source: Ambulatory Visit | Attending: Family Medicine | Admitting: Family Medicine

## 2024-10-11 DIAGNOSIS — M51361 Other intervertebral disc degeneration, lumbar region with lower extremity pain only: Secondary | ICD-10-CM | POA: Diagnosis not present

## 2024-10-11 DIAGNOSIS — M4726 Other spondylosis with radiculopathy, lumbar region: Secondary | ICD-10-CM | POA: Diagnosis not present

## 2024-10-11 DIAGNOSIS — M5116 Intervertebral disc disorders with radiculopathy, lumbar region: Secondary | ICD-10-CM | POA: Diagnosis not present

## 2024-10-11 DIAGNOSIS — M5416 Radiculopathy, lumbar region: Secondary | ICD-10-CM

## 2024-10-11 DIAGNOSIS — M48061 Spinal stenosis, lumbar region without neurogenic claudication: Secondary | ICD-10-CM | POA: Diagnosis not present

## 2024-10-18 ENCOUNTER — Ambulatory Visit: Admitting: Family Medicine

## 2024-10-18 ENCOUNTER — Encounter: Payer: Self-pay | Admitting: Family Medicine

## 2024-10-18 VITALS — BP 122/68 | HR 77 | Resp 16 | Ht 65.0 in | Wt 173.0 lb

## 2024-10-18 DIAGNOSIS — N183 Chronic kidney disease, stage 3 unspecified: Secondary | ICD-10-CM | POA: Diagnosis not present

## 2024-10-18 DIAGNOSIS — Z23 Encounter for immunization: Secondary | ICD-10-CM | POA: Diagnosis not present

## 2024-10-18 DIAGNOSIS — F411 Generalized anxiety disorder: Secondary | ICD-10-CM

## 2024-10-18 DIAGNOSIS — E1169 Type 2 diabetes mellitus with other specified complication: Secondary | ICD-10-CM

## 2024-10-18 DIAGNOSIS — E1122 Type 2 diabetes mellitus with diabetic chronic kidney disease: Secondary | ICD-10-CM | POA: Diagnosis not present

## 2024-10-18 DIAGNOSIS — Z7984 Long term (current) use of oral hypoglycemic drugs: Secondary | ICD-10-CM

## 2024-10-18 DIAGNOSIS — M51379 Other intervertebral disc degeneration, lumbosacral region without mention of lumbar back pain or lower extremity pain: Secondary | ICD-10-CM

## 2024-10-18 DIAGNOSIS — E039 Hypothyroidism, unspecified: Secondary | ICD-10-CM

## 2024-10-18 DIAGNOSIS — I129 Hypertensive chronic kidney disease with stage 1 through stage 4 chronic kidney disease, or unspecified chronic kidney disease: Secondary | ICD-10-CM | POA: Diagnosis not present

## 2024-10-18 DIAGNOSIS — M5030 Other cervical disc degeneration, unspecified cervical region: Secondary | ICD-10-CM

## 2024-10-18 DIAGNOSIS — N1831 Chronic kidney disease, stage 3a: Secondary | ICD-10-CM | POA: Diagnosis not present

## 2024-10-18 DIAGNOSIS — M5481 Occipital neuralgia: Secondary | ICD-10-CM

## 2024-10-18 DIAGNOSIS — K219 Gastro-esophageal reflux disease without esophagitis: Secondary | ICD-10-CM | POA: Diagnosis not present

## 2024-10-18 DIAGNOSIS — E785 Hyperlipidemia, unspecified: Secondary | ICD-10-CM

## 2024-10-18 DIAGNOSIS — E538 Deficiency of other specified B group vitamins: Secondary | ICD-10-CM

## 2024-10-18 LAB — POCT GLYCOSYLATED HEMOGLOBIN (HGB A1C): Hemoglobin A1C: 5.9 % — AB (ref 4.0–5.6)

## 2024-10-18 MED ORDER — CYANOCOBALAMIN 1000 MCG/ML IJ SOLN
1000.0000 ug | Freq: Once | INTRAMUSCULAR | Status: AC
  Administered 2024-10-18: 1000 ug via INTRAMUSCULAR

## 2024-10-18 MED ORDER — ALPRAZOLAM 0.25 MG PO TABS: 0.2500 mg | ORAL_TABLET | Freq: Every day | ORAL | 0 refills | Status: AC | PRN

## 2024-10-18 MED ORDER — BUSPIRONE HCL 10 MG PO TABS: 10.0000 mg | ORAL_TABLET | Freq: Three times a day (TID) | ORAL | 0 refills | Status: AC | PRN

## 2024-10-18 MED ORDER — LEVOTHYROXINE SODIUM 75 MCG PO TABS: 75.0000 ug | ORAL_TABLET | Freq: Every day | ORAL | 0 refills | Status: AC

## 2024-10-18 MED ORDER — EMPAGLIFLOZIN 25 MG PO TABS: 25.0000 mg | ORAL_TABLET | Freq: Every day | ORAL | 1 refills | Status: AC

## 2024-10-18 MED ORDER — LOSARTAN POTASSIUM-HCTZ 50-12.5 MG PO TABS: 0.5000 | ORAL_TABLET | Freq: Every day | ORAL | 1 refills | Status: AC

## 2024-10-18 MED ORDER — CITALOPRAM HYDROBROMIDE 20 MG PO TABS: 20.0000 mg | ORAL_TABLET | Freq: Every day | ORAL | 1 refills | Status: AC

## 2024-10-18 MED ORDER — SIMVASTATIN 40 MG PO TABS: 40.0000 mg | ORAL_TABLET | Freq: Every evening | ORAL | 1 refills | Status: AC

## 2024-10-18 NOTE — Progress Notes (Signed)
 Name: Dawn Fuentes   MRN: [id number]    DOB: 05/11/1946   Date:10/18/2024       Progress Note  Subjective  Chief Complaint  Chief Complaint  Patient presents with   Medical Management of Chronic Issues   Discussed the use of AI scribe software for clinical note transcription with the patient, who gave verbal consent to proceed.  History of Present Illness Dawn Fuentes is a 78 year old female who presents for a regular follow-up visit.  She takes losartan  50/12.5 mg, half a pill daily. No dizziness upon standing, chest pain, or palpitations. She also takes propranolol for headaches, which worsened when she attempted to stop the medication.  She is currently taking levothyroxine  75 mcg daily, with half a pill once a week, on an empty stomach. Her TSH was 0.93 in June of last year, 2.67 in October, and 3.28 in April. Her daughter has thyroid  problems, specifically Hashimoto's thyroiditis, and she inquired about this condition.  She is scheduled for a neck ablation on Monday. She has undergone two tests where the nerves were numbed, which helped. She experiences shoulder pain and headaches, which she wonders might be connected to her neck issues.  She reports low back pain and has had an MRI showing a bulging disc, a synovial cyst at L4-L5, and degenerative disc disease. The pain is localized to her lower back and does not radiate down her leg. She has tried various treatments, including lidoderm  patches and heating pads, but not gabapentin  due to intolerance.  She has type 2 diabetes, with her A1c currently at 5.9. She attributes this improvement to her medication, Jardiance , and her diet. No high or low blood sugar levels at home and no excessive hunger, thirst, or urination.  She experiences anxiety and takes citalopram  20 mg daily and buspirone  twice daily. She also uses alprazolam  as needed, particularly for procedures, and has two pills remaining from a prescription of ten given in  July. She took three pills for recent procedures.  She takes pantoprazole  for reflux as needed and no longer takes famotidine. She uses Ambien  for sleep and reports being current on this medication.  She mentions a new tenderness in the posterior axillary line, noticed since the previous night, which is slightly sore but without redness or warmth.    Patient Active Problem List   Diagnosis Date Noted   Type 2 diabetes mellitus with stage 3 chronic kidney disease and hypertension (HCC) 03/11/2024   Diabetes mellitus, new onset (HCC) 11/02/2023   Occipital neuralgia of right side 09/07/2023   Perennial allergic rhinitis with seasonal variation 09/07/2023   Lichen simplex 03/08/2022   Chronic nonintractable headache 07/06/2021   Cervicogenic migraine 01/14/2021   Cervical facet joint syndrome 01/14/2021   Cervical radiculitis 12/01/2020   Bilateral hip pain 12/01/2020   Chronic bilateral low back pain without sciatica 12/01/2020   Chronic SI joint pain 12/01/2020   Chronic pain syndrome 12/01/2020   Body mass index (BMI) 30.0-30.9, adult 11/18/2020   Essential (primary) hypertension 11/18/2020   Cervical radicular pain 11/18/2020   Degeneration of cervical intervertebral disc 06/25/2020   Lichen sclerosus et atrophicus of the vulva 11/04/2019   [name] deficiency 12/26/2018   Primary open angle glaucoma of both eyes 11/21/2016   Chronic kidney disease (CKD), stage III (moderate) (HCC) 04/06/2016   Cystitis 10/12/2015   GAD (generalized anxiety disorder) 08/21/2015   Benign hypertension 08/21/2015   Insomnia, persistent 08/21/2015   Dyslipidemia 08/21/2015   Gastro-esophageal reflux  disease without esophagitis 08/21/2015   Glaucoma 08/21/2015   Blood glucose elevated 08/21/2015   Adult hypothyroidism 08/21/2015   Climacteric 08/21/2015   Senile osteoporosis 08/21/2015   Seasonal allergies 08/21/2015   Female genuine stress incontinence 08/21/2015   Vitamin D  deficiency 08/21/2015     Past Surgical History:  Procedure Laterality Date   ABDOMINAL HYSTERECTOMY     21 years ago   APPENDECTOMY     COLONOSCOPY N/A 09/14/2020   Procedure: COLONOSCOPY;  Surgeon: Maryruth Ole DASEN, MD;  Location: [facility] ENDOSCOPY;  Service: Endoscopy;  Laterality: N/A;   COLONOSCOPY WITH PROPOFOL  N/A 05/29/2015   Procedure: COLONOSCOPY WITH PROPOFOL ;  Surgeon: Deward CINDERELLA Piedmont, MD;  [facility] ENDOSCOPY;  Service: Gastroenterology;  Laterality: N/A;   ESOPHAGOGASTRODUODENOSCOPY N/A 05/29/2015   Procedure: ESOPHAGOGASTRODUODENOSCOPY (EGD);  Surgeon: Deward CINDERELLA Piedmont, MD;  Location: [facility] ENDOSCOPY;  Service: Gastroenterology;  Laterality: N/A;   ESOPHAGOGASTRODUODENOSCOPY N/A 09/14/2020   Procedure: ESOPHAGOGASTRODUODENOSCOPY (EGD);  Surgeon: Maryruth Ole DASEN, MD;  Location[facility] ENDOSCOPY;  Service: Endoscopy;  Laterality: N/A;   EYE SURGERY     eye lids   OVARIAN CYST REMOVAL     needed transfusion   PAROTIDECTOMY Right 05/05/2021   Procedure: SUPERFICIAL PAROTIDECTOMY;  Surgeon: Blair Deward, MD;  Location: [facility] ORS;  Service: ENT;  Laterality: Right;   PHOTOCOAGULATION WITH LASER Right 11/21/2016   Procedure: PHOTOCOAGULATION WITH LASER;  Surgeon: Donzell Arlyce Budd, MD;  Location: [facility];  Service: Ophthalmology;  Laterality: Right;  A micropulse probe was applied to each hemilimbus with the following settings: , 31.3% duty cycle, 90 seconds x 3 applications.    TONSILLECTOMY      Family History  Problem Relation Age of Onset   Hypertension Mother    Osteoporosis Mother    COPD Mother    Dementia Mother    Cancer Father        Colon   Hypothyroidism Daughter    Aneurysm Brother    COPD Sister     Social History   Tobacco Use   Smoking status: Never   Smokeless tobacco: Never   Tobacco comments:    smoking cessation materials not required  Substance Use Topics   Alcohol use: No    Alcohol/week: 0.0 standard drinks of alcohol     Current Outpatient  Medications:    ALPRAZolam  (XANAX ) 0.25 MG tablet, Take 1 tablet (0.25 mg total) by mouth daily as needed for anxiety. Rarely, Disp: 10 tablet, Rfl: 0   baclofen  (LIORESAL ) 10 MG tablet, Take 1 tablet (10 mg total) by mouth 2 (two) times daily., Disp: 180 each, Rfl: 1   busPIRone  (BUSPAR ) 10 MG tablet, Take 1 tablet (10 mg total) by mouth 3 (three) times daily as needed., Disp: 270 tablet, Rfl: 0   Calcium Carb-Cholecalciferol 600-800 MG-UNIT TABS, Take 1 tablet by mouth daily., Disp: , Rfl:    citalopram  (CELEXA ) 20 MG tablet, Take 1 tablet (20 mg total) by mouth daily., Disp: 30 tablet, Rfl: 0   clobetasol  ointment (TEMOVATE ) 0.05 %, Apply 1 Application topically 2 (two) times a week., Disp: 30 g, Rfl: 2   empagliflozin  (JARDIANCE ) 25 MG TABS tablet, Take 1 tablet (25 mg total) by mouth daily before breakfast., Disp: 90 tablet, Rfl: 1   famotidine (PEPCID) 40 MG tablet, Take 1 tablet by mouth at bedtime., Disp: , Rfl:    hydrocortisone (ANUSOL-HC) 2.5 % rectal cream, Apply topically., Disp: , Rfl:    levocetirizine (XYZAL ) 5 MG tablet, Take 1 tablet (  5 mg total) by mouth every evening., Disp: 90 tablet, Rfl: 1   levothyroxine  (SYNTHROID ) 75 MCG tablet, Take 1 tablet (75 mcg total) by mouth daily before breakfast. Except for Sunday take half pill, Disp: 90 tablet, Rfl: 0   losartan -hydrochlorothiazide  (HYZAAR) 50-12.5 MG tablet, Take 0.5 tablets by mouth daily., Disp: 45 tablet, Rfl: 0   pantoprazole  (PROTONIX ) 40 MG tablet, Take 1 tablet (40 mg total) by mouth daily., Disp: 90 tablet, Rfl: 1   propranolol ER (INDERAL LA) 80 MG 24 hr capsule, , Disp: , Rfl:    simvastatin  (ZOCOR ) 40 MG tablet, Take 1 tablet (40 mg total) by mouth every evening., Disp: 90 tablet, Rfl: 1   timolol (TIMOPTIC) 0.5 % ophthalmic solution, Place 1 drop into both eyes 2 (two) times daily., Disp: , Rfl: 5   Turmeric 500 MG TABS, Take 500 mg by mouth in the morning and at bedtime., Disp: , Rfl:    VYZULTA 0.024 % SOLN,  Place 1 drop into both eyes at bedtime., Disp: , Rfl: 2   zolpidem  (AMBIEN ) 5 MG tablet, Take 1 tablet (5 mg total) by mouth at bedtime., Disp: 90 tablet, Rfl: 0  Allergies  Allergen Reactions   Brimonidine Tartrate Other (See Comments)    Burning in eyes   Latanoprost Other (See Comments)    burning   Meloxicam      Increased blood pressure    Raloxifene     bone pain    I personally reviewed active problem list, medication list, allergies, family history with the patient/caregiver today.   ROS  Ten systems reviewed and is negative except as mentioned in HPI    Objective Physical Exam VITALS: BP- 122/68 CONSTITUTIONAL: Patient appears well-developed and well-nourished. No distress. HEENT: Head atraumatic, normocephalic, neck supple. CARDIOVASCULAR: Normal rate, regular rhythm and normal heart sounds. No murmur heard. No BLE edema. No lumps palpated in the posterior axillary line. PULMONARY: Effort normal and breath sounds normal. No respiratory distress. ABDOMINAL: There is no tenderness or distention. MUSCULOSKELETAL: Normal gait. Without gross motor or sensory deficit. PSYCHIATRIC: Patient has a normal mood and affect. Behavior is normal. Judgment and thought content normal. SKIN: No signs of inflammation, redness, or warmth in the posterior axillary line.  Vitals:   10/18/24 1100  BP: 122/68  Pulse: 77  Resp: 16  SpO2: 97%  Weight: 173 lb (78.5 kg)  Height: 5' 5 (1.651 m)    Body mass index is 28.79 kg/m.  Recent Results (from the past 2160 hours)  POCT HgB A1C     Status: Abnormal   Collection Time: 10/18/24 11:18 AM  Result Value Ref Range   Hemoglobin A1C 5.9 (A) 4.0 - 5.6 %   HbA1c POC (<> result, manual entry)     HbA1c, POC (prediabetic range)     HbA1c, POC (controlled diabetic range)      Diabetic Foot Exam:     PHQ2/9:    08/26/2024    3:00 PM 05/23/2024   11:00 AM 05/14/2024   11:20 AM 03/11/2024    1:27 PM 01/17/2024    1:21 PM  Depression  screen PHQ 2/9  Decreased Interest 0 0 0 1 0  Down, Depressed, Hopeless 0 0 0 1 0  PHQ - 2 Score 0 0 0 2 0  Altered sleeping  0 0 0 0  Tired, decreased energy  0 0 1 0  Change in appetite  0 0 0 0  Feeling bad or failure about yourself  0 0 0 0  Trouble concentrating  0 0 0 0  Moving slowly or fidgety/restless  0 0 0 0  Suicidal thoughts  0 0 0 0  PHQ-9 Score  0 0 3 0  Difficult doing work/chores  Not difficult at all Not difficult at all Somewhat difficult Not difficult at all    phq 9 is negative  Fall Risk:    08/26/2024    3:29 PM 08/26/2024    3:00 PM 05/23/2024   11:03 AM 05/14/2024   11:17 AM 01/17/2024    1:21 PM  Fall Risk   Falls in the past year? 1 0 0 0 0  Number falls in past yr: 0 0 0 0 0  Injury with Fall? 1 0 0 0 0  Risk for fall due to : History of fall(s);Other (Comment) No Fall Risks No Fall Risks No Fall Risks No Fall Risks  Follow up Education provided Falls evaluation completed Falls evaluation completed Falls prevention discussed;Education provided;Falls evaluation completed Falls prevention discussed;Education provided;Falls evaluation completed      Assessment & Plan Type 2 diabetes mellitus with diabetic chronic kidney disease and hypertension Diabetes well-controlled, A1c 5.9. Hypertension controlled, BP 122/68 mmHg. CKD stage 3A stable. Losartan  and Jardiance  protective for kidneys. - Continue losartan  50/12.5 mg, half a pill daily. - Continue Jardiance . - Follow up in four months for routine monitoring. - GFR dropped we will check BMP  Dyslipidemia associated with type 2 DM Managed with simvastatin . - Refill simvastatin  prescription.  Hypothyroidism (likely Hashimoto's thyroiditis) Hypothyroidism stable on levothyroxine . TSH trending up, last 3.28. Likely Hashimoto's due to family history and autoimmune nature. Antibody testing requested, insurance coverage uncertain. - Order thyroid  peroxidase and thyroglobulin antibody tests. - Check TSH levels  today. - Continue levothyroxine  75 mcg daily, half a pill once a week.  Degenerative disc disease of cervical and lumbosacral spine Degenerative disc disease with lumbar spondylosis and moderate left subarticular lateral recess stenosis at L4-L5. MRI showed bulging disc and synovial cyst, no nerve impingement. Pain managed conservatively, prefers to avoid major surgeries.  Occipital neuralgia Managed with propranolol, aids in headache control. - Continue propranolol as prescribed.  Generalized anxiety disorder Managed with citalopram  and buspirone . Alprazolam  used sparingly for acute anxiety. Emphasized buspirone  and non-pharmacological methods before alprazolam . - Continue citalopram  20 mg daily. - Continue buspirone , increase to three times daily if needed. - Limit alprazolam  use to absolute necessity, aim for eight pills to last a year. - Use non-pharmacological methods for anxiety management.  Gastroesophageal reflux disease Managed with pantoprazole . - Continue pantoprazole  as needed.  Deficiency of B group vitamins  Acute right posterior axillary line tenderness Acute tenderness likely muscular in origin, no inflammation or lumps. - Apply warm compresses. - Monitor for signs of inflammation, seek urgent care if symptoms worsen.

## 2024-10-18 NOTE — Progress Notes (Deleted)
 Name: Dawn Fuentes   MRN: [id number]    DOB: [birth year]   Date:10/18/2024       Progress Note  Subjective  Chief Complaint  Chief Complaint  Patient presents with  . Medical Management of Chronic Issues   {HPI:32069} *** Patient Active Problem List   Diagnosis Date Noted  . Type 2 diabetes mellitus with stage 3 chronic kidney disease and hypertension (HCC) 03/11/2024  . Diabetes mellitus, new onset (HCC) 11/02/2023  . Occipital neuralgia of right side 09/07/2023  . Perennial allergic rhinitis with seasonal variation 09/07/2023  . Lichen simplex 03/08/2022  . Chronic nonintractable headache 07/06/2021  . Cervicogenic migraine 01/14/2021  . Cervical facet joint syndrome 01/14/2021  . Cervical radiculitis 12/01/2020  . Bilateral hip pain 12/01/2020  . Chronic bilateral low back pain without sciatica 12/01/2020  . Chronic SI joint pain 12/01/2020  . Chronic pain syndrome 12/01/2020  . Body mass index (BMI) 30.0-30.9, adult 11/18/2020  . Essential (primary) hypertension 11/18/2020  . Cervical radicular pain 11/18/2020  . Degeneration of cervical intervertebral disc 06/25/2020  . Lichen sclerosus et atrophicus of the vulva 11/04/2019  . [name] deficiency 12/26/2018  . Primary open angle glaucoma of both eyes 11/21/2016  . Chronic kidney disease (CKD), stage III (moderate) (HCC) 04/06/2016  . Cystitis 10/12/2015  . GAD (generalized anxiety disorder) 08/21/2015  . Benign hypertension 08/21/2015  . Insomnia, persistent 08/21/2015  . Dyslipidemia 08/21/2015  . Gastro-esophageal reflux disease without esophagitis 08/21/2015  . Glaucoma 08/21/2015  . Blood glucose elevated 08/21/2015  . Adult hypothyroidism 08/21/2015  . Climacteric 08/21/2015  . Senile osteoporosis 08/21/2015  . Seasonal allergies 08/21/2015  . Female genuine stress incontinence 08/21/2015  . Vitamin D  deficiency 08/21/2015    Past Surgical History:  Procedure Laterality Date  . ABDOMINAL HYSTERECTOMY      21 years ago  . APPENDECTOMY    . COLONOSCOPY N/A 09/14/2020   Procedure: COLONOSCOPY;  Surgeon: Maryruth Ole DASEN, MD;  Location[facility] ENDOSCOPY;  Service: Endoscopy;  Laterality: N/A;  . COLONOSCOPY WITH PROPOFOL  N/A 05/29/2015   Procedure: COLONOSCOPY WITH PROPOFOL ;  Surgeon: Deward CINDERELLA Piedmont, MD;  Location[facility] ENDOSCOPY;  Service: Gastroenterology;  Laterality: N/A;  . ESOPHAGOGASTRODUODENOSCOPY N/A 05/29/2015   Procedure: ESOPHAGOGASTRODUODENOSCOPY (EGD);  Surgeon: Deward CINDERELLA Piedmont, MD;  Location: [facility] ENDOSCOPY;  Service: Gastroenterology;  Laterality: N/A;  . ESOPHAGOGASTRODUODENOSCOPY N/A 09/14/2020   Procedure: ESOPHAGOGASTRODUODENOSCOPY (EGD);  Surgeon: Maryruth Ole DASEN, MD;  Location[facility] ENDOSCOPY;  Service: Endoscopy;  Laterality: N/A;  . EYE SURGERY     eye lids  . OVARIAN CYST REMOVAL     needed transfusion  . PAROTIDECTOMY Right 05/05/2021   Procedure: SUPERFICIAL PAROTIDECTOMY;  Surgeon: Blair Deward, MD;  Location: [facility] ORS;  Service: ENT;  Laterality: Right;  . PHOTOCOAGULATION WITH LASER Right 11/21/2016   Procedure: PHOTOCOAGULATION WITH LASER;  Surgeon: Donzell Arlyce Budd, MD;  Location: [facility];  Service: Ophthalmology;  Laterality: Right;  A micropulse probe was applied to each hemilimbus with the following settings: , 31.3% duty cycle, 90 seconds x 3 applications.   . TONSILLECTOMY      Family History  Problem Relation Age of Onset  . Hypertension Mother   . Osteoporosis Mother   . COPD Mother   . Dementia Mother   . Cancer Father        Colon  . Hypothyroidism Daughter   . Aneurysm Brother   . COPD Sister     Social History   Tobacco Use  .  Smoking status: Never  . Smokeless tobacco: Never  . Tobacco comments:    smoking cessation materials not required  Substance Use Topics  . Alcohol use: No    Alcohol/week: 0.0 standard drinks of alcohol     Current Outpatient Medications:  .  ALPRAZolam  (XANAX ) 0.25 MG tablet, Take 1 tablet  (0.25 mg total) by mouth daily as needed for anxiety. Rarely, Disp: 10 tablet, Rfl: 0 .  baclofen  (LIORESAL ) 10 MG tablet, Take 1 tablet (10 mg total) by mouth 2 (two) times daily., Disp: 180 each, Rfl: 1 .  busPIRone  (BUSPAR ) 10 MG tablet, Take 1 tablet (10 mg total) by mouth 3 (three) times daily as needed., Disp: 270 tablet, Rfl: 0 .  Calcium Carb-Cholecalciferol 600-800 MG-UNIT TABS, Take 1 tablet by mouth daily., Disp: , Rfl:  .  citalopram  (CELEXA ) 20 MG tablet, Take 1 tablet (20 mg total) by mouth daily., Disp: 30 tablet, Rfl: 0 .  clobetasol  ointment (TEMOVATE ) 0.05 %, Apply 1 Application topically 2 (two) times a week., Disp: 30 g, Rfl: 2 .  empagliflozin  (JARDIANCE ) 25 MG TABS tablet, Take 1 tablet (25 mg total) by mouth daily before breakfast., Disp: 90 tablet, Rfl: 1 .  famotidine (PEPCID) 40 MG tablet, Take 1 tablet by mouth at bedtime., Disp: , Rfl:  .  hydrocortisone (ANUSOL-HC) 2.5 % rectal cream, Apply topically., Disp: , Rfl:  .  levocetirizine (XYZAL ) 5 MG tablet, Take 1 tablet (5 mg total) by mouth every evening., Disp: 90 tablet, Rfl: 1 .  levothyroxine  (SYNTHROID ) 75 MCG tablet, Take 1 tablet (75 mcg total) by mouth daily before breakfast. Except for Sunday take half pill, Disp: 90 tablet, Rfl: 0 .  losartan -hydrochlorothiazide  (HYZAAR) 50-12.5 MG tablet, Take 0.5 tablets by mouth daily., Disp: 45 tablet, Rfl: 0 .  pantoprazole  (PROTONIX ) 40 MG tablet, Take 1 tablet (40 mg total) by mouth daily., Disp: 90 tablet, Rfl: 1 .  propranolol ER (INDERAL LA) 80 MG 24 hr capsule, , Disp: , Rfl:  .  simvastatin  (ZOCOR ) 40 MG tablet, Take 1 tablet (40 mg total) by mouth every evening., Disp: 90 tablet, Rfl: 1 .  timolol (TIMOPTIC) 0.5 % ophthalmic solution, Place 1 drop into both eyes 2 (two) times daily., Disp: , Rfl: 5 .  Turmeric 500 MG TABS, Take 500 mg by mouth in the morning and at bedtime., Disp: , Rfl:  .  VYZULTA 0.024 % SOLN, Place 1 drop into both eyes at bedtime., Disp: , Rfl:  2 .  zolpidem  (AMBIEN ) 5 MG tablet, Take 1 tablet (5 mg total) by mouth at bedtime., Disp: 90 tablet, Rfl: 0  Allergies  Allergen Reactions  . Brimonidine Tartrate Other (See Comments)    Burning in eyes  . Latanoprost Other (See Comments)    burning  . Meloxicam      Increased blood pressure   . Raloxifene     bone pain    I personally reviewed {Reviewed:14835} with the patient/caregiver today.   ROS  ***  Objective Physical Exam   Vitals:   10/18/24 1100  BP: 122/68  Pulse: 77  Resp: 16  SpO2: 97%  Weight: 173 lb (78.5 kg)  Height: 5' 5 (1.651 m)    Body mass index is 28.79 kg/m.  Recent Results (from the past 2160 hours)  POCT HgB A1C     Status: Abnormal   Collection Time: 10/18/24 11:18 AM  Result Value Ref Range   Hemoglobin A1C 5.9 (A) 4.0 - 5.6 %  HbA1c POC (<> result, manual entry)     HbA1c, POC (prediabetic range)     HbA1c, POC (controlled diabetic range)      Diabetic Foot Exam: {Perform Simple Foot Exam  Perform Detailed exam:1} {Insert foot Exam (Optional):30965}   PHQ2/9:    08/26/2024    3:00 PM 05/23/2024   11:00 AM 05/14/2024   11:20 AM 03/11/2024    1:27 PM 01/17/2024    1:21 PM  Depression screen PHQ 2/9  Decreased Interest 0 0 0 1 0  Down, Depressed, Hopeless 0 0 0 1 0  PHQ - 2 Score 0 0 0 2 0  Altered sleeping  0 0 0 0  Tired, decreased energy  0 0 1 0  Change in appetite  0 0 0 0  Feeling bad or failure about yourself   0 0 0 0  Trouble concentrating  0 0 0 0  Moving slowly or fidgety/restless  0 0 0 0  Suicidal thoughts  0 0 0 0  PHQ-9 Score  0 0 3 0  Difficult doing work/chores  Not difficult at all Not difficult at all Somewhat difficult Not difficult at all    phq 9 is {gen pos wzh:[id number]}  Fall Risk:    08/26/2024    3:29 PM 08/26/2024    3:00 PM 05/23/2024   11:03 AM 05/14/2024   11:17 AM 01/17/2024    1:21 PM  Fall Risk   Falls in the past year? 1 0 0 0 0  Number falls in past yr: 0 0 0 0 0  Injury with Fall? 1  0 0 0 0  Risk for fall due to : History of fall(s);Other (Comment) No Fall Risks No Fall Risks No Fall Risks No Fall Risks  Follow up Education provided Falls evaluation completed Falls evaluation completed Falls prevention discussed;Education provided;Falls evaluation completed Falls prevention discussed;Education provided;Falls evaluation completed     {A&P:32071}

## 2024-10-21 ENCOUNTER — Ambulatory Visit: Payer: Self-pay | Admitting: Family Medicine

## 2024-10-21 DIAGNOSIS — M47812 Spondylosis without myelopathy or radiculopathy, cervical region: Secondary | ICD-10-CM | POA: Diagnosis not present

## 2024-10-22 LAB — THYROID ANTIBODIES (THYROPEROXIDASE & THYROGLOBULIN)
Thyroglobulin Ab: 6 [IU]/mL — ABNORMAL HIGH
Thyroperoxidase Ab SerPl-aCnc: 334 [IU]/mL — ABNORMAL HIGH

## 2024-10-22 LAB — TSH: TSH: 1.73 m[IU]/L (ref 0.40–4.50)

## 2024-11-06 DIAGNOSIS — K219 Gastro-esophageal reflux disease without esophagitis: Secondary | ICD-10-CM | POA: Diagnosis not present

## 2024-11-06 DIAGNOSIS — K449 Diaphragmatic hernia without obstruction or gangrene: Secondary | ICD-10-CM | POA: Diagnosis not present

## 2024-11-06 DIAGNOSIS — K222 Esophageal obstruction: Secondary | ICD-10-CM | POA: Diagnosis not present

## 2024-11-06 DIAGNOSIS — R1319 Other dysphagia: Secondary | ICD-10-CM | POA: Diagnosis not present

## 2024-11-07 ENCOUNTER — Ambulatory Visit

## 2024-11-07 DIAGNOSIS — K449 Diaphragmatic hernia without obstruction or gangrene: Secondary | ICD-10-CM | POA: Diagnosis not present

## 2024-11-07 DIAGNOSIS — K222 Esophageal obstruction: Secondary | ICD-10-CM | POA: Diagnosis not present

## 2024-11-07 DIAGNOSIS — K219 Gastro-esophageal reflux disease without esophagitis: Secondary | ICD-10-CM | POA: Diagnosis not present

## 2024-11-07 DIAGNOSIS — R1314 Dysphagia, pharyngoesophageal phase: Secondary | ICD-10-CM | POA: Diagnosis not present

## 2024-11-25 DIAGNOSIS — M5412 Radiculopathy, cervical region: Secondary | ICD-10-CM | POA: Diagnosis not present

## 2024-11-25 DIAGNOSIS — M47812 Spondylosis without myelopathy or radiculopathy, cervical region: Secondary | ICD-10-CM | POA: Diagnosis not present

## 2024-11-25 DIAGNOSIS — M5380 Other specified dorsopathies, site unspecified: Secondary | ICD-10-CM | POA: Diagnosis not present

## 2024-11-26 DIAGNOSIS — M810 Age-related osteoporosis without current pathological fracture: Secondary | ICD-10-CM | POA: Diagnosis not present

## 2024-12-04 ENCOUNTER — Encounter: Payer: Self-pay | Admitting: Family Medicine

## 2024-12-04 LAB — HM MAMMOGRAPHY

## 2024-12-30 ENCOUNTER — Other Ambulatory Visit: Payer: Self-pay | Admitting: Family Medicine

## 2024-12-30 DIAGNOSIS — G4700 Insomnia, unspecified: Secondary | ICD-10-CM

## 2024-12-31 NOTE — Telephone Encounter (Signed)
 Requested medication (s) are due for refill today: Yes  Requested medication (s) are on the active medication list: Yes  Last refill:  10/08/24  Future visit scheduled: Yes  Notes to clinic:  Not delegated.    Requested Prescriptions  Pending Prescriptions Disp Refills   zolpidem  (AMBIEN ) 5 MG tablet [Pharmacy Med Name: ZOLPIDEM  TARTRATE 5 MG TABLET] 90 tablet 0    Sig: Take 1 tablet (5 mg total) by mouth at bedtime.     Not Delegated - Psychiatry:  Anxiolytics/Hypnotics Failed - 12/31/2024 12:53 PM      Failed - This refill cannot be delegated      Failed - Urine Drug Screen completed in last 360 days      Passed - Valid encounter within last 6 months    Recent Outpatient Visits           2 months ago Hypertension associated with stage 3 chronic kidney disease due to type 2 diabetes mellitus [facility])   [facility] Glenard Mire, MD   4 months ago Type 2 diabetes mellitus with stage 3 chronic kidney disease and hypertension [facility])   [place] [facility] Turney, Mire, MD   5 months ago Type 2 diabetes mellitus with stage 3 chronic kidney disease and hypertension [facility])   [facility] Glenard Mire, MD   7 months ago [name] deficiency   [facility] Glenard Mire, MD   9 months ago Type 2 diabetes mellitus with stage 3 chronic kidney disease and hypertension [facility])   [facility] Sowles, Krichna, MD

## 2025-01-22 ENCOUNTER — Ambulatory Visit

## 2025-02-17 ENCOUNTER — Ambulatory Visit: Admitting: Family Medicine

## 2025-02-26 ENCOUNTER — Ambulatory Visit: Admitting: Family Medicine

## 2025-06-05 ENCOUNTER — Ambulatory Visit
